# Patient Record
Sex: Male | Born: 1950 | Race: White | Hispanic: No | Marital: Single | Smoking: Current every day smoker
Health system: Southern US, Community
[De-identification: ages and names within clinical notes are randomized; demographics above are authoritative.]

## PROBLEM LIST (undated history)

## (undated) DIAGNOSIS — I82409 Acute embolism and thrombosis of unspecified deep veins of unspecified lower extremity: Secondary | ICD-10-CM

## (undated) DIAGNOSIS — I10 Essential (primary) hypertension: Secondary | ICD-10-CM

## (undated) DIAGNOSIS — E119 Type 2 diabetes mellitus without complications: Secondary | ICD-10-CM

## (undated) DIAGNOSIS — I4891 Unspecified atrial fibrillation: Secondary | ICD-10-CM

## (undated) HISTORY — PX: HERNIA REPAIR: SHX51

---

## 2018-12-09 ENCOUNTER — Emergency Department (HOSPITAL_COMMUNITY): Payer: Medicare Other

## 2018-12-09 ENCOUNTER — Other Ambulatory Visit: Payer: Self-pay

## 2018-12-09 ENCOUNTER — Inpatient Hospital Stay (HOSPITAL_COMMUNITY)
Admission: EM | Admit: 2018-12-09 | Discharge: 2018-12-16 | DRG: 563 | Disposition: A | Discharge status: Skilled Nursing Facility | Payer: Medicare Other | Attending: Internal Medicine | Admitting: Internal Medicine

## 2018-12-09 ENCOUNTER — Encounter (HOSPITAL_COMMUNITY): Payer: Self-pay | Admitting: Emergency Medicine

## 2018-12-09 DIAGNOSIS — Z86718 Personal history of other venous thrombosis and embolism: Secondary | ICD-10-CM

## 2018-12-09 DIAGNOSIS — Z7901 Long term (current) use of anticoagulants: Secondary | ICD-10-CM

## 2018-12-09 DIAGNOSIS — T81509A Unspecified complication of foreign body accidentally left in body following unspecified procedure, initial encounter: Secondary | ICD-10-CM

## 2018-12-09 DIAGNOSIS — I4891 Unspecified atrial fibrillation: Secondary | ICD-10-CM | POA: Diagnosis present

## 2018-12-09 DIAGNOSIS — W19XXXA Unspecified fall, initial encounter: Secondary | ICD-10-CM

## 2018-12-09 DIAGNOSIS — R55 Syncope and collapse: Secondary | ICD-10-CM | POA: Diagnosis not present

## 2018-12-09 DIAGNOSIS — Z79899 Other long term (current) drug therapy: Secondary | ICD-10-CM

## 2018-12-09 DIAGNOSIS — E1110 Type 2 diabetes mellitus with ketoacidosis without coma: Secondary | ICD-10-CM

## 2018-12-09 DIAGNOSIS — R7989 Other specified abnormal findings of blood chemistry: Secondary | ICD-10-CM | POA: Diagnosis present

## 2018-12-09 DIAGNOSIS — S92321A Displaced fracture of second metatarsal bone, right foot, initial encounter for closed fracture: Secondary | ICD-10-CM | POA: Diagnosis not present

## 2018-12-09 DIAGNOSIS — I10 Essential (primary) hypertension: Secondary | ICD-10-CM | POA: Diagnosis present

## 2018-12-09 DIAGNOSIS — Z7984 Long term (current) use of oral hypoglycemic drugs: Secondary | ICD-10-CM

## 2018-12-09 DIAGNOSIS — F17210 Nicotine dependence, cigarettes, uncomplicated: Secondary | ICD-10-CM | POA: Diagnosis present

## 2018-12-09 DIAGNOSIS — F419 Anxiety disorder, unspecified: Secondary | ICD-10-CM | POA: Diagnosis present

## 2018-12-09 DIAGNOSIS — F4310 Post-traumatic stress disorder, unspecified: Secondary | ICD-10-CM | POA: Diagnosis present

## 2018-12-09 DIAGNOSIS — E1165 Type 2 diabetes mellitus with hyperglycemia: Secondary | ICD-10-CM | POA: Diagnosis present

## 2018-12-09 DIAGNOSIS — R531 Weakness: Secondary | ICD-10-CM

## 2018-12-09 DIAGNOSIS — S92901A Unspecified fracture of right foot, initial encounter for closed fracture: Secondary | ICD-10-CM | POA: Diagnosis present

## 2018-12-09 DIAGNOSIS — Z20828 Contact with and (suspected) exposure to other viral communicable diseases: Secondary | ICD-10-CM | POA: Diagnosis present

## 2018-12-09 DIAGNOSIS — I700 Atherosclerosis of aorta: Secondary | ICD-10-CM | POA: Diagnosis present

## 2018-12-09 DIAGNOSIS — T17808A Unspecified foreign body in other parts of respiratory tract causing other injury, initial encounter: Secondary | ICD-10-CM | POA: Diagnosis present

## 2018-12-09 DIAGNOSIS — E119 Type 2 diabetes mellitus without complications: Secondary | ICD-10-CM

## 2018-12-09 DIAGNOSIS — Z79891 Long term (current) use of opiate analgesic: Secondary | ICD-10-CM

## 2018-12-09 DIAGNOSIS — I2510 Atherosclerotic heart disease of native coronary artery without angina pectoris: Secondary | ICD-10-CM | POA: Diagnosis present

## 2018-12-09 DIAGNOSIS — Z7982 Long term (current) use of aspirin: Secondary | ICD-10-CM

## 2018-12-09 DIAGNOSIS — I77810 Thoracic aortic ectasia: Secondary | ICD-10-CM | POA: Diagnosis present

## 2018-12-09 DIAGNOSIS — Z95828 Presence of other vascular implants and grafts: Secondary | ICD-10-CM

## 2018-12-09 DIAGNOSIS — E1142 Type 2 diabetes mellitus with diabetic polyneuropathy: Secondary | ICD-10-CM | POA: Diagnosis present

## 2018-12-09 DIAGNOSIS — W1830XA Fall on same level, unspecified, initial encounter: Secondary | ICD-10-CM | POA: Diagnosis present

## 2018-12-09 DIAGNOSIS — S99921A Unspecified injury of right foot, initial encounter: Secondary | ICD-10-CM | POA: Diagnosis present

## 2018-12-09 DIAGNOSIS — F329 Major depressive disorder, single episode, unspecified: Secondary | ICD-10-CM | POA: Diagnosis not present

## 2018-12-09 HISTORY — DX: Acute embolism and thrombosis of unspecified deep veins of unspecified lower extremity: I82.409

## 2018-12-09 HISTORY — DX: Type 2 diabetes mellitus without complications: E11.9

## 2018-12-09 HISTORY — DX: Essential (primary) hypertension: I10

## 2018-12-09 LAB — CBC WITH DIFFERENTIAL/PLATELET
Abs Immature Granulocytes: 0.04 10*3/uL (ref 0.00–0.07)
Basophils Absolute: 0 10*3/uL (ref 0.0–0.1)
Basophils Relative: 0 %
Eosinophils Absolute: 0.2 10*3/uL (ref 0.0–0.5)
Eosinophils Relative: 2 %
HCT: 40.1 % (ref 39.0–52.0)
Hemoglobin: 13.7 g/dL (ref 13.0–17.0)
Immature Granulocytes: 1 %
Lymphocytes Relative: 12 %
Lymphs Abs: 1 10*3/uL (ref 0.7–4.0)
MCH: 28.4 pg (ref 26.0–34.0)
MCHC: 34.2 g/dL (ref 30.0–36.0)
MCV: 83 fL (ref 80.0–100.0)
Monocytes Absolute: 0.8 10*3/uL (ref 0.1–1.0)
Monocytes Relative: 9 %
Neutro Abs: 6.4 10*3/uL (ref 1.7–7.7)
Neutrophils Relative %: 76 %
Platelets: 146 10*3/uL — ABNORMAL LOW (ref 150–400)
RBC: 4.83 MIL/uL (ref 4.22–5.81)
RDW: 12.7 % (ref 11.5–15.5)
WBC: 8.4 10*3/uL (ref 4.0–10.5)
nRBC: 0 % (ref 0.0–0.2)

## 2018-12-09 NOTE — ED Triage Notes (Addendum)
Patient arrived by EMS from home. Pt had a fall. Pt was found by [employer] pl[employer] out in the parking lot outside of his apartment.   Pt has abrasion to LFT elbow per EMS.   Alert and Oriented x 4 per EMS.   Pt is on blood thinners per EMS.   Pt denies hitting head and LOC per EMS.   Pt has chronic leg pain w/ numbness.   Pt also lost his home key per EMS. Patient's landlord stated they would pick patient up upon discharge per EMS.

## 2018-12-09 NOTE — ED Notes (Signed)
Pt ambulatory with unsteady gait using one assist to the restroom. On the return trip, he became increasingly unsteady on his feet and required two staff assist to return to his bed.

## 2018-12-09 NOTE — ED Provider Notes (Signed)
[facility] DEPT Provider Note   CSN: [id number] Arrival date & time: 12/09/18  1833     History   Chief Complaint Chief Complaint  Patient presents with  . Fall    HPI Randall Coleman is a 68 y.o. male.     Patient with past medical history notable for diabetes, hypertension, DVT, presents to the emergency department with a chief complaint of fall.  He states that he has had increasing weakness over the past couple of days, but significantly worsened today.  States that he felt a little dizzy and fell landing on his left side.  He denies striking his head.  He reports generalized weakness in bilateral upper and lower extremities.  Denies chest pain or shortness of breath.  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  He is anticoagulated.  Uncertain of compliance.  The history is provided by the patient. No language interpreter was used.    Past Medical History:  Diagnosis Date  . Diabetes mellitus without complication ([place])   . DVT (deep venous thrombosis) ([place])   . Hypertension     There are no active problems to display for this patient.   Past Surgical History:  Procedure Laterality Date  . HERNIA REPAIR    . HERNIA REPAIR          Home Medications    Prior to Admission medications   Not on File    Family History History reviewed. No pertinent family history.  Social History Social History   Tobacco Use  . Smoking status: Current Some Day Smoker  . Smokeless tobacco: Never Used  Substance Use Topics  . Alcohol use: Not Currently    Frequency: Never  . Drug use: Never     Allergies   Patient has no allergy information on record.   Review of Systems Review of Systems  All other systems reviewed and are negative.    Physical Exam Updated Vital Signs BP (!) 189/106   Pulse (!) 120   Temp 98.9 F (37.2 C) (Oral)   Resp 20   Ht 6\' 2"  (1.88 m)   Wt 108.9 kg   SpO2 93%   BMI 30.81 kg/m   Physical Exam Vitals signs and  nursing note reviewed.  Constitutional:      Appearance: He is well-developed.  HENT:     Head: Normocephalic and atraumatic.  Eyes:     Conjunctiva/sclera: Conjunctivae normal.  Neck:     Musculoskeletal: Neck supple.  Cardiovascular:     Rate and Rhythm: Regular rhythm. Tachycardia present.     Heart sounds: No murmur.     Comments: Tachycardic Pulmonary:     Effort: Pulmonary effort is normal. No respiratory distress.     Breath sounds: Normal breath sounds.  Abdominal:     Palpations: Abdomen is soft.     Tenderness: There is no abdominal tenderness.  Musculoskeletal: Normal range of motion.  Skin:    General: Skin is warm and dry.     Comments: Mild skin tear to left posterior elbow  Neurological:     Mental Status: He is alert and oriented to person, place, and time.     Comments: Speech is slurred, but patient states this is his normal speech CN III-XII intact Moves all extremities No strength deficits  Psychiatric:        Mood and Affect: Mood normal.        Behavior: Behavior normal.      ED Treatments / Results  Labs (all labs ordered are listed, but only abnormal results are displayed) Labs Reviewed  CBC WITH DIFFERENTIAL/PLATELET  COMPREHENSIVE METABOLIC PANEL  ETHANOL  RAPID URINE DRUG SCREEN, HOSP PERFORMED  URINALYSIS, ROUTINE W REFLEX MICROSCOPIC  D-DIMER, QUANTITATIVE (NOT AT [facility])  TROPONIN I (HIGH SENSITIVITY)    EKG None  Radiology No results found.  Procedures Procedures (including critical care time)  Medications Ordered in ED Medications - No data to display   Initial Impression / Assessment and Plan / ED Course  I have reviewed the triage vital signs and the nursing notes.  Pertinent labs & imaging results that were available during my care of the patient were reviewed by me and considered in my medical decision making (see chart for details).        Patient with history of DVTs.  Noted to be tachycardic.  Questionable  near syncope.  With hx of DVT with questionable compliance, will check d-dimer.  No apparent head injury, but with him being anticoagulated and falling, will check CT head and neck.  He does have some slurred speech on my exam, but he states that this is his normal speech.  No available prior records to review in epic.  Will check screening labs.  Laboratory work-up concerning for elevated troponin 65, also d-dimer is elevated at 3.43.  Will check CT PE.  CT head and cervical spine are negative for acute process.  CT PE study notable for metallic foreign body in the right pulmonary artery.  This could be from prior guidewire versus IVC filter fracture.  Radiology recommends plain films of the abdomen with subsequent CT imaging if IVC filter is present.  This will likely need to be retrieved.  Case discussed with Dr. Leonette Monarch, who recommends admission to the hospital.  Patient will likely need to be seen by interventional radiology to see about retrieval of the metallic foreign body in the pulmonary artery.  Appreciate Dr. Marlowe Sax for bringing patient into the hospital.  Final Clinical Impressions(s) / ED Diagnoses   Final diagnoses:  Generalized weakness    ED Discharge Orders    None       Montine Circle, PA-C 12/10/18 [id number]    Margette Fast, MD 12/10/18 1145

## 2018-12-10 ENCOUNTER — Other Ambulatory Visit: Payer: Self-pay

## 2018-12-10 ENCOUNTER — Encounter (HOSPITAL_COMMUNITY): Payer: Self-pay

## 2018-12-10 ENCOUNTER — Observation Stay (HOSPITAL_COMMUNITY): Payer: Medicare Other

## 2018-12-10 ENCOUNTER — Emergency Department (HOSPITAL_COMMUNITY): Payer: Medicare Other

## 2018-12-10 ENCOUNTER — Observation Stay (HOSPITAL_BASED_OUTPATIENT_CLINIC_OR_DEPARTMENT_OTHER): Payer: Medicare Other

## 2018-12-10 DIAGNOSIS — I10 Essential (primary) hypertension: Secondary | ICD-10-CM | POA: Diagnosis present

## 2018-12-10 DIAGNOSIS — T17808A Unspecified foreign body in other parts of respiratory tract causing other injury, initial encounter: Secondary | ICD-10-CM | POA: Diagnosis present

## 2018-12-10 DIAGNOSIS — R55 Syncope and collapse: Secondary | ICD-10-CM | POA: Diagnosis present

## 2018-12-10 DIAGNOSIS — Z86718 Personal history of other venous thrombosis and embolism: Secondary | ICD-10-CM

## 2018-12-10 DIAGNOSIS — I4891 Unspecified atrial fibrillation: Secondary | ICD-10-CM | POA: Diagnosis present

## 2018-12-10 DIAGNOSIS — E119 Type 2 diabetes mellitus without complications: Secondary | ICD-10-CM

## 2018-12-10 DIAGNOSIS — S99921A Unspecified injury of right foot, initial encounter: Secondary | ICD-10-CM | POA: Diagnosis present

## 2018-12-10 DIAGNOSIS — E1110 Type 2 diabetes mellitus with ketoacidosis without coma: Secondary | ICD-10-CM

## 2018-12-10 LAB — URINALYSIS, ROUTINE W REFLEX MICROSCOPIC
Bilirubin Urine: NEGATIVE
Glucose, UA: >=500 mg/dL — AB
Hgb urine dipstick: NEGATIVE
Ketones, ur: 5 mg/dL — AB
Leukocytes,Ua: NEGATIVE
Nitrite: NEGATIVE
Protein, ur: NEGATIVE mg/dL
Specific Gravity, Urine: 1.012 (ref 1.005–1.030)
pH: 6 (ref 5.0–8.0)

## 2018-12-10 LAB — RAPID URINE DRUG SCREEN, HOSP PERFORMED
Amphetamines: NOT DETECTED
Barbiturates: NOT DETECTED
Benzodiazepines: NOT DETECTED
Cocaine: NOT DETECTED
Opiates: NOT DETECTED
Tetrahydrocannabinol: NOT DETECTED

## 2018-12-10 LAB — COMPREHENSIVE METABOLIC PANEL
ALT: 28 U/L (ref 0–44)
AST: 28 U/L (ref 15–41)
Albumin: 4.2 g/dL (ref 3.5–5.0)
Alkaline Phosphatase: 41 U/L (ref 38–126)
Anion gap: 11 (ref 5–15)
BUN: 14 mg/dL (ref 8–23)
CO2: 23 mmol/L (ref 22–32)
Calcium: 8.9 mg/dL (ref 8.9–10.3)
Chloride: 101 mmol/L (ref 98–111)
Creatinine, Ser: 0.89 mg/dL (ref 0.61–1.24)
GFR calc Af Amer: >60 mL/min (ref >60)
GFR calc non Af Amer: >60 mL/min (ref >60)
Glucose, Bld: 213 mg/dL — ABNORMAL HIGH (ref 70–99)
Potassium: 4 mmol/L (ref 3.5–5.1)
Sodium: 135 mmol/L (ref 135–145)
Total Bilirubin: 0.8 mg/dL (ref 0.3–1.2)
Total Protein: 6.4 g/dL — ABNORMAL LOW (ref 6.5–8.1)

## 2018-12-10 LAB — ETHANOL: Alcohol, Ethyl (B): <10 mg/dL (ref <10)

## 2018-12-10 LAB — GLUCOSE, CAPILLARY
Glucose-Capillary: 247 mg/dL — ABNORMAL HIGH (ref 70–99)
Glucose-Capillary: 226 mg/dL — ABNORMAL HIGH (ref 70–99)
Glucose-Capillary: 213 mg/dL — ABNORMAL HIGH (ref 70–99)

## 2018-12-10 LAB — PROTIME-INR
INR: 1.1 (ref 0.8–1.2)
Prothrombin Time: 14.2 seconds (ref 11.4–15.2)

## 2018-12-10 LAB — SARS CORONAVIRUS 2 (TAT 6-24 HRS): SARS Coronavirus 2: NEGATIVE

## 2018-12-10 LAB — HEMOGLOBIN A1C
Hgb A1c MFr Bld: 7.3 % — ABNORMAL HIGH (ref 4.8–5.6)
Mean Plasma Glucose: 162.81 mg/dL

## 2018-12-10 LAB — TSH: TSH: 1.061 u[IU]/mL (ref 0.350–4.500)

## 2018-12-10 LAB — TROPONIN I (HIGH SENSITIVITY)
Troponin I (High Sensitivity): 65 ng/L — ABNORMAL HIGH (ref <18)
Troponin I (High Sensitivity): 69 ng/L — ABNORMAL HIGH (ref <18)

## 2018-12-10 LAB — D-DIMER, QUANTITATIVE: D-Dimer, Quant: 3.43 ug/mL-FEU — ABNORMAL HIGH (ref 0.00–0.50)

## 2018-12-10 LAB — ECHOCARDIOGRAM COMPLETE
Height: 74 in
Weight: 3840 oz

## 2018-12-10 LAB — HIV ANTIBODY (ROUTINE TESTING W REFLEX): HIV Screen 4th Generation wRfx: NONREACTIVE

## 2018-12-10 MED ORDER — PROPRANOLOL HCL 10 MG PO TABS
10.0000 mg | ORAL_TABLET | Freq: Every day | ORAL | Status: DC
  Administered 2018-12-10 – 2018-12-16 (×7): 10 mg via ORAL
  Filled 2018-12-10 (×7): qty 1

## 2018-12-10 MED ORDER — LISINOPRIL 5 MG PO TABS
5.0000 mg | ORAL_TABLET | Freq: Every day | ORAL | Status: DC
  Administered 2018-12-10 – 2018-12-16 (×7): 5 mg via ORAL
  Filled 2018-12-10 (×7): qty 1

## 2018-12-10 MED ORDER — IOHEXOL 350 MG/ML SOLN
100.0000 mL | Freq: Once | INTRAVENOUS | Status: AC | PRN
  Administered 2018-12-10: 100 mL via INTRAVENOUS

## 2018-12-10 MED ORDER — SODIUM CHLORIDE 0.9% FLUSH
3.0000 mL | Freq: Two times a day (BID) | INTRAVENOUS | Status: DC
  Administered 2018-12-10 – 2018-12-16 (×10): 3 mL via INTRAVENOUS

## 2018-12-10 MED ORDER — APIXABAN 5 MG PO TABS: 5.0000 mg | ORAL_TABLET | Freq: Two times a day (BID) | ORAL | Status: DC

## 2018-12-10 MED ORDER — ARIPIPRAZOLE 10 MG PO TABS
20.0000 mg | ORAL_TABLET | Freq: Every day | ORAL | Status: DC
  Administered 2018-12-10 – 2018-12-16 (×7): 20 mg via ORAL
  Filled 2018-12-10 (×7): qty 2

## 2018-12-10 MED ORDER — BUSPIRONE HCL 5 MG PO TABS
15.0000 mg | ORAL_TABLET | Freq: Three times a day (TID) | ORAL | Status: DC
  Administered 2018-12-10 – 2018-12-16 (×19): 15 mg via ORAL
  Filled 2018-12-10 (×18): qty 3

## 2018-12-10 MED ORDER — ADULT MULTIVITAMIN W/MINERALS CH
1.0000 | ORAL_TABLET | Freq: Every day | ORAL | Status: DC
  Administered 2018-12-10 – 2018-12-16 (×7): 1 via ORAL
  Filled 2018-12-10 (×7): qty 1

## 2018-12-10 MED ORDER — INSULIN ASPART 100 UNIT/ML ~~LOC~~ SOLN
0.0000 [IU] | Freq: Every day | SUBCUTANEOUS | Status: DC
  Administered 2018-12-10 – 2018-12-11 (×2): 2 [IU] via SUBCUTANEOUS
  Administered 2018-12-13: 4 [IU] via SUBCUTANEOUS
  Administered 2018-12-14 – 2018-12-15 (×2): 2 [IU] via SUBCUTANEOUS

## 2018-12-10 MED ORDER — NICOTINE 21 MG/24HR TD PT24
21.0000 mg | MEDICATED_PATCH | Freq: Every day | TRANSDERMAL | Status: DC
  Administered 2018-12-10 – 2018-12-16 (×7): 21 mg via TRANSDERMAL
  Filled 2018-12-10 (×7): qty 1

## 2018-12-10 MED ORDER — BENZTROPINE MESYLATE 0.5 MG PO TABS
1.0000 mg | ORAL_TABLET | Freq: Every day | ORAL | Status: DC
  Administered 2018-12-10 – 2018-12-16 (×7): 1 mg via ORAL
  Filled 2018-12-10 (×7): qty 2

## 2018-12-10 MED ORDER — APIXABAN 5 MG PO TABS
5.0000 mg | ORAL_TABLET | Freq: Two times a day (BID) | ORAL | Status: DC
  Administered 2018-12-10: 5 mg via ORAL
  Filled 2018-12-10: qty 1

## 2018-12-10 MED ORDER — GABAPENTIN 300 MG PO CAPS
300.0000 mg | ORAL_CAPSULE | Freq: Three times a day (TID) | ORAL | Status: DC
  Administered 2018-12-10 – 2018-12-16 (×19): 300 mg via ORAL
  Filled 2018-12-10 (×19): qty 1

## 2018-12-10 MED ORDER — BUPROPION HCL ER (XL) 150 MG PO TB24
150.0000 mg | ORAL_TABLET | Freq: Every day | ORAL | Status: DC
  Administered 2018-12-10 – 2018-12-16 (×7): 150 mg via ORAL
  Filled 2018-12-10 (×7): qty 1

## 2018-12-10 MED ORDER — LACTATED RINGERS IV SOLN
INTRAVENOUS | Status: DC
  Administered 2018-12-10 – 2018-12-11 (×2): via INTRAVENOUS

## 2018-12-10 MED ORDER — SODIUM CHLORIDE (PF) 0.9 % IJ SOLN
INTRAMUSCULAR | Status: AC
  Filled 2018-12-10: qty 50

## 2018-12-10 MED ORDER — ASPIRIN EC 81 MG PO TBEC
81.0000 mg | DELAYED_RELEASE_TABLET | Freq: Every day | ORAL | Status: DC
  Administered 2018-12-10 – 2018-12-16 (×7): 81 mg via ORAL
  Filled 2018-12-10 (×7): qty 1

## 2018-12-10 MED ORDER — ACETAMINOPHEN 650 MG RE SUPP
650.0000 mg | Freq: Four times a day (QID) | RECTAL | Status: DC | PRN
  Filled 2018-12-10: qty 1

## 2018-12-10 MED ORDER — ONDANSETRON HCL 4 MG/2ML IJ SOLN: 4.0000 mg | Freq: Four times a day (QID) | INTRAMUSCULAR | Status: DC | PRN

## 2018-12-10 MED ORDER — PANTOPRAZOLE SODIUM 40 MG PO TBEC
40.0000 mg | DELAYED_RELEASE_TABLET | Freq: Every day | ORAL | Status: DC
  Administered 2018-12-10 – 2018-12-16 (×7): 40 mg via ORAL
  Filled 2018-12-10 (×7): qty 1

## 2018-12-10 MED ORDER — ONDANSETRON HCL 4 MG PO TABS: 4.0000 mg | ORAL_TABLET | Freq: Four times a day (QID) | ORAL | Status: DC | PRN

## 2018-12-10 MED ORDER — TRAZODONE HCL 100 MG PO TABS
200.0000 mg | ORAL_TABLET | Freq: Every day | ORAL | Status: DC
  Administered 2018-12-10 – 2018-12-15 (×6): 200 mg via ORAL
  Filled 2018-12-10 (×6): qty 2

## 2018-12-10 MED ORDER — INSULIN ASPART 100 UNIT/ML ~~LOC~~ SOLN
0.0000 [IU] | Freq: Three times a day (TID) | SUBCUTANEOUS | Status: DC
  Administered 2018-12-10 (×2): 5 [IU] via SUBCUTANEOUS
  Administered 2018-12-11: 8 [IU] via SUBCUTANEOUS
  Administered 2018-12-11: 5 [IU] via SUBCUTANEOUS
  Administered 2018-12-11 – 2018-12-12 (×2): 3 [IU] via SUBCUTANEOUS
  Administered 2018-12-12: 8 [IU] via SUBCUTANEOUS
  Administered 2018-12-12 – 2018-12-13 (×2): 5 [IU] via SUBCUTANEOUS
  Administered 2018-12-13: 2 [IU] via SUBCUTANEOUS
  Administered 2018-12-13: 5 [IU] via SUBCUTANEOUS
  Administered 2018-12-14: 8 [IU] via SUBCUTANEOUS
  Administered 2018-12-14: 5 [IU] via SUBCUTANEOUS
  Administered 2018-12-14: 8 [IU] via SUBCUTANEOUS
  Administered 2018-12-15: 12:00:00 5 [IU] via SUBCUTANEOUS
  Administered 2018-12-15: 17:00:00 15 [IU] via SUBCUTANEOUS
  Administered 2018-12-15: 08:00:00 5 [IU] via SUBCUTANEOUS
  Administered 2018-12-16: 15 [IU] via SUBCUTANEOUS
  Administered 2018-12-16: 8 [IU] via SUBCUTANEOUS

## 2018-12-10 MED ORDER — ACETAMINOPHEN 325 MG PO TABS
650.0000 mg | ORAL_TABLET | Freq: Four times a day (QID) | ORAL | Status: DC | PRN
  Administered 2018-12-10 – 2018-12-14 (×3): 650 mg via ORAL
  Filled 2018-12-10 (×3): qty 2

## 2018-12-10 NOTE — H&P (Addendum)
History and Physical    Randall Coleman [id number] DOB: 09/08/1950 DOA: 12/09/2018  [facility]  [facility] - [facility]opedics; other specialists through the [place] Patient coming from:  Home - lives alone in an apartment; NOK: Ernestine Conrad, [phone]  Chief Complaint: Syncope  HPI: Randall Coleman is a 67 y.o. male with medical history significant of HTN; DM; and DVT presenting with syncope.  He was working on a car and moving it by hand.  He turned over and when he did the car "squeezed" him along his right side.  He denies LOC.  Other friends were also helping, and they were in the parking lot.  His friends decided to call 911.  His foot is the only thing that hurts now.  He feels his heart beating fast at times but denies h/o afib.  He was on Coumadin and is now on Eliquis.  Denies h/o blood clot.  I spoke with his friend.  She is the Secondary school teacher at his apartment complex.  He went to [place] and drive to get groceries.  His car wouldn't start so he started walking back to his apartment.  He fell twice, possibly because his legs gave out.  Someone saw him fall and took him home.  She was gone much of the day and when she came back his car was back.  She saw him lying on the concrete next to the car and he was disoriented - she isn't sure if he fell.  She tried to talk to him and he was talking crazy.  His head was bleeding a bit and his elbows looked bruised.  He wasn't able to get up and some folks tried to get him up and he was still weak and disoriented and so they called 911.  He takes a lot of medicines, for his legs and for depression.  She doesn't know what else he takes.  He wears compression socks.    ED Course:  Carryover, per Dr. Marlowe Sax:  History per ED PA Lorre Munroe. Patient is a poor historian and not much history could be obtained from him. He was found down outside his apartment yesterday evening. Awake and alert now. Head CT negative. Troponin 65 >69. EKG  negative. D-dimer elevated and CT angiogram was ordered. CT angiogram negative for PE but showing metallic foreign body in the right pulmonary artery. Radiologist feels this could be from an IVC filter versus guidewire. He will need IR consultation in a.m. for removal of this foreign body.  Review of Systems: As per HPI; otherwise review of systems reviewed and negative.   Ambulatory Status:  Ambulates without assistance  Past Medical History:  Diagnosis Date   Diabetes mellitus without complication ([place])    DVT (deep venous thrombosis) (HCC)    Hypertension     Past Surgical History:  Procedure Laterality Date   HERNIA REPAIR     HERNIA REPAIR      Social History   Socioeconomic History   Marital status: Single    Spouse name: Not on file   Number of children: Not on file   Years of education: Not on file   Highest education level: Not on file  Occupational History   Occupation: retired  Scientist, product/process development strain: Not on file   Food insecurity    Worry: Not on file    Inability: Not on file   Transportation needs    Medical: Not on file    Non-medical: Not  on file  Tobacco Use   Smoking status: Current Every Day Smoker    Packs/day: 1.00    Years: 54.00    Pack years: 54.00   Smokeless tobacco: Never Used  Substance and Sexual Activity   Alcohol use: Not Currently    Frequency: Never    Comment: h/o heavy use, quit about 10 years ago   Drug use: Never    Comment: used when he was younger   Sexual activity: Not Currently  Lifestyle   Physical activity    Days per week: Not on file    Minutes per session: Not on file   Stress: Not on file  Relationships   Social connections    Talks on phone: Not on file    Gets together: Not on file    Attends religious service: Not on file    Active member of club or organization: Not on file    Attends meetings of clubs or organizations: Not on file    Relationship status: Not on  file   Intimate partner violence    Fear of current or ex partner: Not on file    Emotionally abused: Not on file    Physically abused: Not on file    Forced sexual activity: Not on file  Other Topics Concern   Not on file  Social History Narrative   Not on file    No Known Allergies  History reviewed. No pertinent family history.  Prior to Admission medications   Not on File    Physical Exam: Vitals:   12/10/18 0615 12/10/18 0700 12/10/18 0730 12/10/18 0821  BP:  (!) 152/79 (!) 167/96 (!) 173/81  Pulse: 88 86 91 95  Resp: 17 (!) 23 14 18   Temp:    99 F (37.2 C)  TempSrc:    Oral  SpO2: 96% 93% 96% 97%  Weight:      Height:          General:  Appears calm and comfortable and is NAD  Eyes:  PERRL, EOMI, normal lids, iris  ENT:  grossly normal hearing, lips & tongue, mmm; edentulous  Neck:  no LAD, masses or thyromegaly  Cardiovascular:  Irregularly irregular, rate controlled, no m/r/g.   Respiratory:   CTA bilaterally with no wheezes/rales/rhonchi.  Normal respiratory effort.  Abdomen:  soft, NT, ND, NABS  Back:   normal alignment, no CVAT  Skin:  R elbow ecchymosis, R foot with diffuse ecchymoses but particularly around great toe and base of 1st 3 toes        Musculoskeletal:  grossly normal tone BUE/BLE, good ROM, no obvious bony abnormality  Lower extremity:  R foot edema with ecchymosis concerning for fractures; otherwise B LE without edema  Psychiatric:  grossly normal mood and affect, speech fluent and appropriate, AOx3, poor historian  Neurologic:  CN 2-12 grossly intact, moves all extremities in coordinated fashion, sensation intact    Radiological Exams on Admission: Ct Abdomen Wo Contrast  Result Date: 12/10/2018 CLINICAL DATA:  Linear wire like metallic foreign body in the LEFT main pulmonary artery. EXAM: CT ABDOMEN WITHOUT CONTRAST TECHNIQUE: Multidetector CT imaging of the abdomen was performed following the standard protocol  without IV contrast. COMPARISON:  CT chest same day FINDINGS: Lower chest:  Lung bases are clear. Hepatobiliary: No focal hepatic lesion.  Gallbladder normal Pancreas: Normal pancreatic parenchymal intensity. No ductal dilatation or inflammation. Spleen: Normal spleen. Adrenals/urinary tract: Adrenal glands and kidneys are normal. Stomach/Bowel: Stomach and limited of the  small bowel is unremarkable Vascular/Lymphatic: Interval calcification of abdominal aorta. Infrarenal IVC filter in appropriate position. There is no evidence fracture of the filter. The struts appear intact. Musculoskeletal: No aggressive osseous lesion IMPRESSION: Infrarenal IVC filter in appropriate position. No hardware fracture evident. Electronically Signed   By: Suzy Bouchard M.D.   On: 12/10/2018 06:05   Dg Chest 2 View  Result Date: 12/09/2018 CLINICAL DATA:  Fall EXAM: CHEST - 2 VIEW COMPARISON:  None. FINDINGS: Basilar atelectatic changes. Question small granuloma versus vessel en face the right cardiophrenic sulcus. No consolidation, features of edema, pneumothorax, or effusion. Pulmonary vascularity is normally distributed. The cardiomediastinal contours are unremarkable. No acute osseous or soft tissue abnormality. Degenerative changes are present in the imaged spine and shoulders. 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 IMPRESSION: 1. No acute cardiopulmonary disease or traumatic finding in the chest. 2. Question small granuloma versus vessel en face in the right lung base. Electronically Signed   By: Lovena Le M.D.   On: 12/09/2018 22:34   Ct Head Wo Contrast  Result Date: 12/09/2018 CLINICAL DATA:  Found down outside of apartment EXAM: CT HEAD WITHOUT CONTRAST CT CERVICAL SPINE WITHOUT CONTRAST TECHNIQUE: Multidetector CT imaging of the head and cervical spine was performed following the standard protocol without intravenous contrast.  Multiplanar CT image reconstructions of the cervical spine were also generated. COMPARISON:  None. FINDINGS: CT HEAD FINDINGS Brain: No evidence of acute infarction, hemorrhage, hydrocephalus, extra-axial collection or mass lesion/mass effect. Symmetric prominence of the ventricles, cisterns and sulci compatible with parenchymal volume loss. Patchy areas of white matter hypoattenuation are most compatible with chronic microvascular angiopathy. Vascular: Atherosclerotic calcification of the carotid siphons and intradural vertebral arteries. No hyperdense vessel. Skull: Mild right supraorbital soft tissue swelling. No calvarial fracture or suspicious osseous lesion. Sinuses/Orbits: Leftward nasal septal deviation. Paranasal sinuses and mastoid air cells are predominantly clear. Pneumatization of the petrous apices. Small amount of debris in the right external auditory canal. Included orbital structures are unremarkable. Other: Edentulous. CT CERVICAL SPINE FINDINGS Alignment: Cervical stabilization collar in place. There is slight exaggeration of normal cervical lordosis. No traumatic listhesis. Craniocervical and atlantoaxial articulations are normally aligned. No abnormal facet widening. Skull base and vertebrae: No acute fracture. No primary bone lesion or focal pathologic process. From Soft tissues and spinal canal: No pre or paravertebral fluid or swelling. No visible canal hematoma. Disc levels: Multilevel intervertebral disc height loss with spondylitic endplate changes and facet hypertrophic changes. Disc osteophyte complexes at C3-4, C4-5 and C6-7 result in at most mild canal stenosis. Uncinate spurring and facet hypertrophy results in mild-to-moderate multilevel foraminal narrowing throughout the cervical spine. Upper chest: No acute abnormality in the upper chest or imaged lung apices. Other: Included portions of the thyroid are unremarkable. There is calcification in the carotid bifurcations. IMPRESSION:  1. No acute intracranial abnormality. 2. Mild right supraorbital soft tissue swelling. 3. No acute cervical spine fracture or traumatic listhesis. 4. Multilevel cervical spondylitic changes, as above. Electronically Signed   By: Lovena Le M.D.   On: 12/09/2018 22:49   Ct Angio Chest Pe W And/or Wo Contrast  Result Date: 12/10/2018 CLINICAL DATA:  Fall, found outside apartment. History of blood clots. Positive D-dimer. EXAM: CT ANGIOGRAPHY CHEST WITH CONTRAST TECHNIQUE: Multidetector CT imaging of the chest was performed using the standard protocol during bolus administration of intravenous contrast. Multiplanar CT image reconstructions and MIPs were obtained to evaluate the vascular anatomy. CONTRAST:  196mL  OMNIPAQUE IOHEXOL 350 MG/ML SOLN COMPARISON:  Same day chest radiograph FINDINGS: Cardiovascular: Satisfactory opacification of the pulmonary arteries to the segmental level. No central, lobar or segmental filling defects are identified. There is a linear, metallic density along the posterior aspect of left main pulmonary artery approximately 3.6 cm in length. Atherosclerotic plaque within the normal caliber aorta. No periaortic stranding. Normal heart size. No pericardial effusion. Insert coronary Mediastinum/Nodes: No enlarged mediastinal or axillary lymph nodes. Thyroid gland, trachea, and esophagus demonstrate no significant findings. Few calcified right hilar nodes. Lungs/Pleura: Dependent atelectasis seen posteriorly. No consolidation, features of edema, pneumothorax, or effusion. Calcified granuloma seen in the right lung base corresponding to the radiographic abnormality. No suspicious pulmonary nodules or masses. Upper Abdomen: No acute abnormalities present in the visualized portions of the upper abdomen. Musculoskeletal: Tiny hypoattenuating 1 cm structure associated with the epidermal air along the right chest wall likely reflecting an epidermal inclusion cyst. No acute osseous abnormality or  suspicious osseous lesion. Multilevel degenerative changes are present in the imaged portions of the spine. 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 Remote posttraumatic deformity of the right anterior fifth rib. Review of the MIP images confirms the above findings. IMPRESSION: 1. No evidence of pulmonary embolism. No acute intrathoracic process. 2. Linear, metallic density along the posterior aspect of the left main pulmonary artery approximately 3.6 cm in length, favored to represent a metallic foreign body. Question the presence of an inferior vena cava filter on lateral chest radiograph, could reflect caval filter strut or fragmented guidewire. Could consider abdominal radiograph to confirm the presence of a caval filter and if present CT of the abdomen and pelvis would need to be obtained to assess the structural integrity as this will likely require retrieval. 3. Evidence of remote granulomatous disease in the chest. 4. Aortic Atherosclerosis (ICD10-I70.0). 5. Coronary atherosclerosis. These results were called by telephone at the time of interpretation on 12/10/2018 at 3:26 am to provider Montine Circle , who verbally acknowledged these results. Electronically Signed   By: Lovena Le M.D.   On: 12/10/2018 03:26   Ct Cervical Spine Wo Contrast  Result Date: 12/09/2018 CLINICAL DATA:  Found down outside of apartment EXAM: CT HEAD WITHOUT CONTRAST CT CERVICAL SPINE WITHOUT CONTRAST TECHNIQUE: Multidetector CT imaging of the head and cervical spine was performed following the standard protocol without intravenous contrast. Multiplanar CT image reconstructions of the cervical spine were also generated. COMPARISON:  None. FINDINGS: CT HEAD FINDINGS Brain: No evidence of acute infarction, hemorrhage, hydrocephalus, extra-axial collection or mass lesion/mass effect. Symmetric prominence of the ventricles, cisterns and sulci compatible  with parenchymal volume loss. Patchy areas of white matter hypoattenuation are most compatible with chronic microvascular angiopathy. Vascular: Atherosclerotic calcification of the carotid siphons and intradural vertebral arteries. No hyperdense vessel. Skull: Mild right supraorbital soft tissue swelling. No calvarial fracture or suspicious osseous lesion. Sinuses/Orbits: Leftward nasal septal deviation. Paranasal sinuses and mastoid air cells are predominantly clear. Pneumatization of the petrous apices. Small amount of debris in the right external auditory canal. Included orbital structures are unremarkable. Other: Edentulous. CT CERVICAL SPINE FINDINGS Alignment: Cervical stabilization collar in place. There is slight exaggeration of normal cervical lordosis. No traumatic listhesis. Craniocervical and atlantoaxial articulations are normally aligned. No abnormal facet widening. Skull base and vertebrae: No acute fracture. No primary bone lesion or focal pathologic process. From Soft tissues and spinal canal: No pre or paravertebral fluid or swelling. No visible canal hematoma. Disc levels: Multilevel  intervertebral disc height loss with spondylitic endplate changes and facet hypertrophic changes. Disc osteophyte complexes at C3-4, C4-5 and C6-7 result in at most mild canal stenosis. Uncinate spurring and facet hypertrophy results in mild-to-moderate multilevel foraminal narrowing throughout the cervical spine. Upper chest: No acute abnormality in the upper chest or imaged lung apices. Other: Included portions of the thyroid are unremarkable. There is calcification in the carotid bifurcations. IMPRESSION: 1. No acute intracranial abnormality. 2. Mild right supraorbital soft tissue swelling. 3. No acute cervical spine fracture or traumatic listhesis. 4. Multilevel cervical spondylitic changes, as above. Electronically Signed   By: Lovena Le M.D.   On: 12/09/2018 22:49   Dg Abd 2 Views  Result Date:  12/10/2018 CLINICAL DATA:  IVC filter location. EXAM: ABDOMEN - 2 VIEW COMPARISON:  None. FINDINGS: There is an IVC filter at the level of the renal vessels. There is excreted contrast within the renal pelvises and right ureter. Bowel gas pattern is normal. IMPRESSION: IVC filter at the level of the renal vessels. Electronically Signed   By: Ulyses Jarred M.D.   On: 12/10/2018 04:11    EKG: Independently reviewed.  NSR with PSVT vs. afib with rate 88;  no evidence of acute ischemia   Labs on Admission: I have personally reviewed the available labs and imaging studies at the time of the admission.  Pertinent labs:   Glucose 213 HS troponin 65, 69 Unremarkable CBC D-dimer 3.43 INR 1.1 ETOH <10 UA: >500 glucose, 5 ketones, rare bacteria UDS negative COVID pending   Assessment/Plan Principal Problem:   Syncope and collapse Active Problems:   Foreign body in lung   Foot injury, right, initial encounter   Essential hypertension   History of DVT of lower extremity   Type 2 diabetes mellitus without complication (HCC)   Unspecified atrial fibrillation (HCC)   Syncope -Patient is a poor historian and the history is unclear about whether there was actually a syncopal event -He reports getting "squeezed" by his car and was found on the ground with apparent confusion -He appears to be in afib and it is not clear if he has a h/o this -For now will observe on telemetry -Repeat EKG -Check echo -Orthostatic vital signs in AM -Elevated troponin but stable, likely demand ischemia rather than ACS -Neuro checks  -PT/OT eval and treat  Foreign body in pulmonary artery -Patient had screening CXR after fall yesterday and it showed ?granuloma vs. Vessel en face in the RLL -CTA was ordered which showed a linear metallic density in the L main pulmonary artery, 3.6 cm, concerning for metallic foreign body - possible IVC strut or fragmented guidewire and so CT abdomen/pelvis recommended -Abd xray  performed and showed IVC present at the level of the renal vessels, which was confirmed by abdominal/pelvic CT, without hardware fracture evident -With this information, IR consult placed for foreign body retrieval -I discussed the patient with Dr. Vernard Gambles -NPO after MN in anticipation of procedure  Foot injury -Uncertain exact mechanism of injury, but likely injured during fall/syncopal event -Fot xray ordered and shows a mildly displaced fracture at the base of the second metatarsal with mild displacement of the metatarsal, raising concern for a Lisfranc injury -I have spoken by Dr. Griffin Basil, who will plan to discuss further with foot/ankle specialist regarding further treatment -The foot is edematous enough that current surgery may not be reasonable and so he is planning to request ortho tech assistance for now  Concern for afib -EKG yesterday appeared to  be sinus arrhythmia but repeat EKG overnight was less clearly sinus -This AM on auscultation, he appears to be fairly clearly in afib -Will repeat EKG -Echo ordered -He is rate controlled with Propranolol already -He is already on Eliquis and this has been continued  HTN -Continue Lisinopril and Propranolol  DM -Will check A1c -hold Glucophage and Glucotrol -Cover with moderate-scale SSI  H/o DVT -Friend reports that he wears compression stockings -Continue Eliquis  Chronic mental illness -Uncertain baseline condition -Home medications continued including Abilify, Cogentin, Wellbutrin, Buspar, Trazodone    Note: This patient has been tested and is pending for the novel coronavirus COVID-19.   DVT prophylaxis:  Eliquis Code Status:  Full - confirmed with patient Family Communication: None present; I spoke with his friend by telephone  Disposition Plan:  Home once clinically improved Consults called: IR; orthopedics  Admission status: It is my clinical opinion that referral for OBSERVATION is reasonable and necessary in  this patient based on the above information provided. The aforementioned taken together are felt to place the patient at high risk for further clinical deterioration. However it is anticipated that the patient may be medically stable for discharge from the hospital within 24 to 48 hours.    Karmen Bongo MD [facility]   How to contact the [facility] Attending or Consulting provider [place] or covering provider during after hours [place], for this patient?  1. Check the care team in [facility] and look for a) attending/consulting [facility] provider listed and b) the [facility] team listed 2. Log into [email or web address] and use [facility] universal password to access. If you do not have the password, please contact the hospital operator. 3. Locate the [facility] provider you are looking for under [facility] and page to a number that you can be directly reached. 4. If you still have difficulty reaching the provider, please page the [facility] (Director on Call) for the Hospitalists listed on amion for assistance.   12/10/2018, 9:59 AM

## 2018-12-10 NOTE — Progress Notes (Signed)
  Echocardiogram 2D Echocardiogram has been performed.  Randall Coleman 12/10/2018, 1:22 PM

## 2018-12-10 NOTE — Consult Note (Signed)
ORTHOPAEDIC CONSULTATION  REQUESTING PHYSICIAN: Karmen Bongo, MD  Chief Complaint: Fall, right foot pain  HPI: Trung Wenzl is a 68 y.o. male with medical history significant of HTN, DM, and DVT. He presented to the Emergency Department following a fall/syncopal episode. Patient was admitted to the hospital for further evaluation.  Orthopedics was consulted for the patient's foot injury. Exact mechanism of injury is uncertain, but likely during his fall/syncopal episode.   Xrays show a mildly displaced fracture at the base of the second metatarsal with mild displacement of the metatarsal, raising concern for a Lisfranc injury.   Patient's right foot was placed in a splint. He is resting comfortable in his hospital bed watching football. He reports minimal pain regarding his right foot. Pain worsens when he tries to move his toes. He denies any other injury, numbness or tingling, or fever and chills.   Past Medical History:  Diagnosis Date   Diabetes mellitus without complication (HCC)    DVT (deep venous thrombosis) (HCC)    Hypertension    Past Surgical History:  Procedure Laterality Date   HERNIA REPAIR     HERNIA REPAIR     Social History   Socioeconomic History   Marital status: Single    Spouse name: Not on file   Number of children: Not on file   Years of education: Not on file   Highest education level: Not on file  Occupational History   Occupation: retired  Scientist, product/process development strain: Not on file   Food insecurity    Worry: Not on file    Inability: Not on Lexicographer needs    Medical: Not on file    Non-medical: Not on file  Tobacco Use   Smoking status: Current Every Day Smoker    Packs/day: 1.00    Years: 54.00    Pack years: 54.00   Smokeless tobacco: Never Used  Substance and Sexual Activity   Alcohol use: Not Currently    Frequency: Never    Comment: h/o heavy use, quit about 10 years ago   Drug  use: Never    Comment: used when he was younger   Sexual activity: Not Currently  Lifestyle   Physical activity    Days per week: Not on file    Minutes per session: Not on file   Stress: Not on file  Relationships   Social connections    Talks on phone: Not on file    Gets together: Not on file    Attends religious service: Not on file    Active member of club or organization: Not on file    Attends meetings of clubs or organizations: Not on file    Relationship status: Not on file  Other Topics Concern   Not on file  Social History Narrative   Not on file   History reviewed. No pertinent family history. No Known Allergies Prior to Admission medications   Medication Sig Start Date End Date Taking? Authorizing Provider  apixaban (ELIQUIS) 5 MG TABS tablet Take 5 mg by mouth 2 (two) times daily.   Yes [provider]  ARIPiprazole (ABILIFY) 20 MG tablet Take 20 mg by mouth daily.   Yes [provider]  aspirin EC 81 MG tablet Take 81 mg by mouth daily.   Yes [provider]  benztropine (COGENTIN) 1 MG tablet Take 1 mg by mouth daily.   Yes [provider]  buPROPion (WELLBUTRIN XL)  150 MG 24 hr tablet Take 150 mg by mouth daily.   Yes [provider]  busPIRone (BUSPAR) 15 MG tablet Take 15 mg by mouth 3 (three) times daily.   Yes [provider]  gabapentin (NEURONTIN) 300 MG capsule Take 300 mg by mouth 3 (three) times daily. 03/15/17  Yes [provider]  glipiZIDE (GLUCOTROL) 5 MG tablet Take 2.5 mg by mouth 2 (two) times daily.   Yes [provider]  lisinopril (ZESTRIL) 10 MG tablet Take 5 mg by mouth daily.   Yes [provider]  metFORMIN (GLUMETZA) 1000 MG (MOD) 24 hr tablet Take 1,000 mg by mouth 2 (two) times daily.   Yes [provider]  Multiple Vitamin (MULTI-VITAMIN) tablet Take 1 tablet by mouth daily.   Yes [provider]  omeprazole (PRILOSEC) 20 MG capsule  Take 20 mg by mouth 2 (two) times daily.   Yes [provider]  propranolol (INDERAL) 10 MG tablet Take 10 mg by mouth daily.   Yes [provider]  traZODone (DESYREL) 100 MG tablet Take 200 mg by mouth at bedtime.   Yes [provider]   Ct Abdomen Wo Contrast  Result Date: 12/10/2018 CLINICAL DATA:  Linear wire like metallic foreign body in the LEFT main pulmonary artery. EXAM: CT ABDOMEN WITHOUT CONTRAST TECHNIQUE: Multidetector CT imaging of the abdomen was performed following the standard protocol without IV contrast. COMPARISON:  CT chest same day FINDINGS: Lower chest:  Lung bases are clear. Hepatobiliary: No focal hepatic lesion.  Gallbladder normal Pancreas: Normal pancreatic parenchymal intensity. No ductal dilatation or inflammation. Spleen: Normal spleen. Adrenals/urinary tract: Adrenal glands and kidneys are normal. Stomach/Bowel: Stomach and limited of the small bowel is unremarkable Vascular/Lymphatic: Interval calcification of abdominal aorta. Infrarenal IVC filter in appropriate position. There is no evidence fracture of the filter. The struts appear intact. Musculoskeletal: No aggressive osseous lesion IMPRESSION: Infrarenal IVC filter in appropriate position. No hardware fracture evident. Electronically Signed   By: Suzy Bouchard M.D.   On: 12/10/2018 06:05   Dg Chest 2 View  Result Date: 12/09/2018 CLINICAL DATA:  Fall EXAM: CHEST - 2 VIEW COMPARISON:  None. FINDINGS: Basilar atelectatic changes. Question small granuloma versus vessel en face the right cardiophrenic sulcus. No consolidation, features of edema, pneumothorax, or effusion. Pulmonary vascularity is normally distributed. The cardiomediastinal contours are unremarkable. No acute osseous or soft tissue abnormality. Degenerative changes are present in the imaged spine and shoulders. 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  IMPRESSION: 1. No acute cardiopulmonary disease or traumatic finding in the chest. 2. Question small granuloma versus vessel en face in the right lung base. Electronically Signed   By: Lovena Le M.D.   On: 12/09/2018 22:34   Ct Head Wo Contrast  Result Date: 12/09/2018 CLINICAL DATA:  Found down outside of apartment EXAM: CT HEAD WITHOUT CONTRAST CT CERVICAL SPINE WITHOUT CONTRAST TECHNIQUE: Multidetector CT imaging of the head and cervical spine was performed following the standard protocol without intravenous contrast. Multiplanar CT image reconstructions of the cervical spine were also generated. COMPARISON:  None. FINDINGS: CT HEAD FINDINGS Brain: No evidence of acute infarction, hemorrhage, hydrocephalus, extra-axial collection or mass lesion/mass effect. Symmetric prominence of the ventricles, cisterns and sulci compatible with parenchymal volume loss. Patchy areas of white matter hypoattenuation are most compatible with chronic microvascular angiopathy. Vascular: Atherosclerotic calcification of the carotid siphons and intradural vertebral arteries. No hyperdense vessel. Skull: Mild right supraorbital soft tissue  swelling. No calvarial fracture or suspicious osseous lesion. Sinuses/Orbits: Leftward nasal septal deviation. Paranasal sinuses and mastoid air cells are predominantly clear. Pneumatization of the petrous apices. Small amount of debris in the right external auditory canal. Included orbital structures are unremarkable. Other: Edentulous. CT CERVICAL SPINE FINDINGS Alignment: Cervical stabilization collar in place. There is slight exaggeration of normal cervical lordosis. No traumatic listhesis. Craniocervical and atlantoaxial articulations are normally aligned. No abnormal facet widening. Skull base and vertebrae: No acute fracture. No primary bone lesion or focal pathologic process. From Soft tissues and spinal canal: No pre or paravertebral fluid or swelling. No visible canal hematoma. Disc  levels: Multilevel intervertebral disc height loss with spondylitic endplate changes and facet hypertrophic changes. Disc osteophyte complexes at C3-4, C4-5 and C6-7 result in at most mild canal stenosis. Uncinate spurring and facet hypertrophy results in mild-to-moderate multilevel foraminal narrowing throughout the cervical spine. Upper chest: No acute abnormality in the upper chest or imaged lung apices. Other: Included portions of the thyroid are unremarkable. There is calcification in the carotid bifurcations. IMPRESSION: 1. No acute intracranial abnormality. 2. Mild right supraorbital soft tissue swelling. 3. No acute cervical spine fracture or traumatic listhesis. 4. Multilevel cervical spondylitic changes, as above. Electronically Signed   By: Lovena Le M.D.   On: 12/09/2018 22:49   Ct Angio Chest Pe W And/or Wo Contrast  Result Date: 12/10/2018 CLINICAL DATA:  Fall, found outside apartment. History of blood clots. Positive D-dimer. EXAM: CT ANGIOGRAPHY CHEST WITH CONTRAST TECHNIQUE: Multidetector CT imaging of the chest was performed using the standard protocol during bolus administration of intravenous contrast. Multiplanar CT image reconstructions and MIPs were obtained to evaluate the vascular anatomy. CONTRAST:  167m OMNIPAQUE IOHEXOL 350 MG/ML SOLN COMPARISON:  Same day chest radiograph FINDINGS: Cardiovascular: Satisfactory opacification of the pulmonary arteries to the segmental level. No central, lobar or segmental filling defects are identified. There is a linear, metallic density along the posterior aspect of left main pulmonary artery approximately 3.6 cm in length. Atherosclerotic plaque within the normal caliber aorta. No periaortic stranding. Normal heart size. No pericardial effusion. Insert coronary Mediastinum/Nodes: No enlarged mediastinal or axillary lymph nodes. Thyroid gland, trachea, and esophagus demonstrate no significant findings. Few calcified right hilar nodes.  Lungs/Pleura: Dependent atelectasis seen posteriorly. No consolidation, features of edema, pneumothorax, or effusion. Calcified granuloma seen in the right lung base corresponding to the radiographic abnormality. No suspicious pulmonary nodules or masses. Upper Abdomen: No acute abnormalities present in the visualized portions of the upper abdomen. Musculoskeletal: Tiny hypoattenuating 1 cm structure associated with the epidermal air along the right chest wall likely reflecting an epidermal inclusion cyst. No acute osseous abnormality or suspicious osseous lesion. Multilevel degenerative changes are present in the imaged portions of the spine. 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 Remote posttraumatic deformity of the right anterior fifth rib. Review of the MIP images confirms the above findings. IMPRESSION: 1. No evidence of pulmonary embolism. No acute intrathoracic process. 2. Linear, metallic density along the posterior aspect of the left main pulmonary artery approximately 3.6 cm in length, favored to represent a metallic foreign body. Question the presence of an inferior vena cava filter on lateral chest radiograph, could reflect caval filter strut or fragmented guidewire. Could consider abdominal radiograph to confirm the presence of a caval filter and if present CT of the abdomen and pelvis would need to be obtained to assess the structural integrity as this will likely require retrieval. 3.  Evidence of remote granulomatous disease in the chest. 4. Aortic Atherosclerosis (ICD10-I70.0). 5. Coronary atherosclerosis. These results were called by telephone at the time of interpretation on 12/10/2018 at 3:26 am to provider Montine Circle , who verbally acknowledged these results. Electronically Signed   By: Lovena Le M.D.   On: 12/10/2018 03:26   Ct Cervical Spine Wo Contrast  Result Date: 12/09/2018 CLINICAL DATA:  Found down  outside of apartment EXAM: CT HEAD WITHOUT CONTRAST CT CERVICAL SPINE WITHOUT CONTRAST TECHNIQUE: Multidetector CT imaging of the head and cervical spine was performed following the standard protocol without intravenous contrast. Multiplanar CT image reconstructions of the cervical spine were also generated. COMPARISON:  None. FINDINGS: CT HEAD FINDINGS Brain: No evidence of acute infarction, hemorrhage, hydrocephalus, extra-axial collection or mass lesion/mass effect. Symmetric prominence of the ventricles, cisterns and sulci compatible with parenchymal volume loss. Patchy areas of white matter hypoattenuation are most compatible with chronic microvascular angiopathy. Vascular: Atherosclerotic calcification of the carotid siphons and intradural vertebral arteries. No hyperdense vessel. Skull: Mild right supraorbital soft tissue swelling. No calvarial fracture or suspicious osseous lesion. Sinuses/Orbits: Leftward nasal septal deviation. Paranasal sinuses and mastoid air cells are predominantly clear. Pneumatization of the petrous apices. Small amount of debris in the right external auditory canal. Included orbital structures are unremarkable. Other: Edentulous. CT CERVICAL SPINE FINDINGS Alignment: Cervical stabilization collar in place. There is slight exaggeration of normal cervical lordosis. No traumatic listhesis. Craniocervical and atlantoaxial articulations are normally aligned. No abnormal facet widening. Skull base and vertebrae: No acute fracture. No primary bone lesion or focal pathologic process. From Soft tissues and spinal canal: No pre or paravertebral fluid or swelling. No visible canal hematoma. Disc levels: Multilevel intervertebral disc height loss with spondylitic endplate changes and facet hypertrophic changes. Disc osteophyte complexes at C3-4, C4-5 and C6-7 result in at most mild canal stenosis. Uncinate spurring and facet hypertrophy results in mild-to-moderate multilevel foraminal narrowing  throughout the cervical spine. Upper chest: No acute abnormality in the upper chest or imaged lung apices. Other: Included portions of the thyroid are unremarkable. There is calcification in the carotid bifurcations. IMPRESSION: 1. No acute intracranial abnormality. 2. Mild right supraorbital soft tissue swelling. 3. No acute cervical spine fracture or traumatic listhesis. 4. Multilevel cervical spondylitic changes, as above. Electronically Signed   By: Lovena Le M.D.   On: 12/09/2018 22:49   Dg Abd 2 Views  Result Date: 12/10/2018 CLINICAL DATA:  IVC filter location. EXAM: ABDOMEN - 2 VIEW COMPARISON:  None. FINDINGS: There is an IVC filter at the level of the renal vessels. There is excreted contrast within the renal pelvises and right ureter. Bowel gas pattern is normal. IMPRESSION: IVC filter at the level of the renal vessels. Electronically Signed   By: Ulyses Jarred M.D.   On: 12/10/2018 04:11   Dg Foot Complete Right  Result Date: 12/10/2018 CLINICAL DATA:  Redness, blister, and swelling on right foot, and 1-3rd MTPs. Pt denies recent injury. Hx DVT on left leg EXAM: RIGHT FOOT COMPLETE - 3+ VIEW COMPARISON:  None. FINDINGS: There is a mildly displaced fracture at the base of the second metatarsal with a small amount of lateral displacement of the metatarsal. No definite additional fracture in the adjacent metatarsals. Scattered degenerative changes. No focal bony lesion. Swelling about the anterior midfoot. IMPRESSION: Mildly displaced fracture at the base of the second metatarsal with a mild lateral displacement of the metatarsal, raising concern for Lisfranc injury. Recommend orthopedic consultation and possible CT  for further evaluation. Electronically Signed   By: Audie Pinto M.D.   On: 12/10/2018 10:08   Family History Reviewed and non-contributory, no pertinent history of problems with bleeding or anesthesia      Review of Systems 14 system ROS conducted and negative except  for that noted in HPI   OBJECTIVE  Vitals: Patient Vitals for the past 8 hrs:  BP Temp Temp src Pulse Resp SpO2  12/10/18 1324 (!) 144/82 98.2 F (36.8 C) Oral 85 16 96 %   General: Alert, no acute distress Cardiovascular: Warm extremities noted Respiratory: No cyanosis, no use of accessory musculature GI: No organomegaly, abdomen is soft and non-tender Skin: No lesions in the area of chief complaint other than those listed below in MSK exam. Splint on right lower extremity.  Neurologic: Sensation intact distally  Psychiatric: Patient is competent for consent with normal mood and affect Lymphatic: No swelling obvious and reported  Extremities  RUE: full range of motion. Neurovascularly intact.  No pain throughout upper extremity.  ZWC:HENI range of motion. Neurovascularly intact.  No pain throughout upper extremity.  RLE: Splint in place. Not tender to palpation throughout knee and proximal tibia/fibula. Neurovascularly intact distally. Normal range of motion of toes.  LLE: full range of motion. Neurovascularly intact. Statis dermatitis of left lower extremity. No pedal edema.     Test Results Imaging - X-ray: 3 views of the right foot were reviewed. X-rays demonstrate Mildly displaced fracture at the base of the second metatarsal with a mild lateral displacement of the metatarsal, raising concern for Lisfranc injury.  Labs cbc Recent Labs    12/09/18 2304  WBC 8.4  HGB 13.7  HCT 40.1  PLT 146*    Labs inflam No results for input(s): CRP in the last 72 hours.  Invalid input(s): ESR  Labs coag Recent Labs    12/09/18 2304  INR 1.1    Recent Labs    12/09/18 2304  NA 135  K 4.0  CL 101  CO2 23  GLUCOSE 213*  BUN 14  CREATININE 0.89  CALCIUM 8.9     ASSESSMENT AND PLAN: 68 y.o. male with the following: mildly displaced fracture at the base of the second metatarsal with mild lateral displacement, concern for possible Lisfranc injury.   The case was  discussed with Dr. Radene Journey. Will proceed with non-operative treatment at this time and close follow-up.  Patient was placed in a short leg splint. He will remain in the splint until his outpatient follow-up with Dr. Radene Journey. Instructed to keep his splint clean and dry.   - Weight Bearing Status/Activity: NWB RLE  -VTE Prophylaxis: Per Hospitalist note, patient will continue Eliquis   - Follow-up plan: Dr. Radene Journey following discharge  Patient is clear to be discharged from orthopedic standpoint. Discharge date per Hospitalist recommendation.

## 2018-12-10 NOTE — Consult Note (Addendum)
Chief Complaint: Patient was seen in consultation today for foreign body  Referring Physician(s): Dr. Posey Pronto  Supervising Physician: Arne Cleveland  Patient Status: [facility] - In-pt  History of Present Illness: Randall Coleman is a 68 y.o. male with past medical history of DM, DVT, HTN who presented to [facility] ED after a syncopal event at home.  Patient being treated for mildly displaced metatarasal fracture as well as to identify the cause of his syncope/fall. Work-up includeded a CTA Chest which showed: 1. No evidence of pulmonary embolism. No acute intrathoracic process. 2. Linear, metallic density along the posterior aspect of the left main pulmonary artery approximately 3.6 cm in length, favored to represent a metallic foreign body. Question the presence of an inferior vena cava filter on lateral chest radiograph, could reflect caval filter strut or fragmented guidewire. Could consider abdominal radiograph to confirm the presence of a caval filter and if present CT of the abdomen and pelvis would need to be obtained to assess the structural integrity as this will likely require retrieval. 3. Evidence of remote granulomatous disease in the chest. 4. Aortic Atherosclerosis (ICD10-I70.0). 5. Coronary atherosclerosis.  IR consulted for evaluation and possible foreign body removal.   PA met with patient at bedside.  He initially states he was not aware he had filter and did not know anything about this potential foreign body.  After some conversation he interrupts to say "I need to tell you something before I forget.  I got a phone from my doctor saying that he didn't know how this had happened but that something was in there.  The big doctor told the little doctor it might not be anything to worry about."  Patient is alert and oriented.  He's able to describe events and general conversations he's had with providers, however he seems to have a general lack of knowledge about his health or  care plans, and has limited insight into both.  He thinks he is getting work up for this potentially at the [place].  States he's had several blood clots but is not aware of any diagnosis of a fib although he is irregular irregular on my exam today.   Past Medical History:  Diagnosis Date   Diabetes mellitus without complication ([place])    DVT (deep venous thrombosis) ([place])    Hypertension     Past Surgical History:  Procedure Laterality Date   HERNIA REPAIR     HERNIA REPAIR      Allergies: Patient has no known allergies.  Medications: Prior to Admission medications   Medication Sig Start Date End Date Taking? Authorizing Provider  apixaban (ELIQUIS) 5 MG TABS tablet Take 5 mg by mouth 2 (two) times daily.   Yes [provider]  ARIPiprazole (ABILIFY) 20 MG tablet Take 20 mg by mouth daily.   Yes [provider]  aspirin EC 81 MG tablet Take 81 mg by mouth daily.   Yes [provider]  benztropine (COGENTIN) 1 MG tablet Take 1 mg by mouth daily.   Yes [provider]  buPROPion (WELLBUTRIN XL) 150 MG 24 hr tablet Take 150 mg by mouth daily.   Yes [provider]  busPIRone (BUSPAR) 15 MG tablet Take 15 mg by mouth 3 (three) times daily.   Yes [provider]  gabapentin (NEURONTIN) 300 MG capsule Take 300 mg by mouth 3 (three) times daily. 03/15/17  Yes [provider]  glipiZIDE (GLUCOTROL) 5 MG tablet Take 2.5 mg by mouth 2 (two)  times daily.   Yes [provider]  lisinopril (ZESTRIL) 10 MG tablet Take 5 mg by mouth daily.   Yes [provider]  metFORMIN (GLUMETZA) 1000 MG (MOD) 24 hr tablet Take 1,000 mg by mouth 2 (two) times daily.   Yes [provider]  Multiple Vitamin (MULTI-VITAMIN) tablet Take 1 tablet by mouth daily.   Yes [provider]  omeprazole (PRILOSEC) 20 MG capsule Take 20 mg by mouth 2 (two) times daily.   Yes [provider]  propranolol (INDERAL) 10 MG  tablet Take 10 mg by mouth daily.   Yes [provider]  traZODone (DESYREL) 100 MG tablet Take 200 mg by mouth at bedtime.   Yes [provider]     History reviewed. No pertinent family history.  Social History   Socioeconomic History   Marital status: Single    Spouse name: Not on file   Number of children: Not on file   Years of education: Not on file   Highest education level: Not on file  Occupational History   Occupation: retired  Scientist, product/process development strain: Not on file   Food insecurity    Worry: Not on file    Inability: Not on Lexicographer needs    Medical: Not on file    Non-medical: Not on file  Tobacco Use   Smoking status: Current Every Day Smoker    Packs/day: 1.00    Years: 54.00    Pack years: 54.00   Smokeless tobacco: Never Used  Substance and Sexual Activity   Alcohol use: Not Currently    Frequency: Never    Comment: h/o heavy use, quit about 10 years ago   Drug use: Never    Comment: used when he was younger   Sexual activity: Not Currently  Lifestyle   Physical activity    Days per week: Not on file    Minutes per session: Not on file   Stress: Not on file  Relationships   Social connections    Talks on phone: Not on file    Gets together: Not on file    Attends religious service: Not on file    Active member of club or organization: Not on file    Attends meetings of clubs or organizations: Not on file    Relationship status: Not on file  Other Topics Concern   Not on file  Social History Narrative   Not on file     Review of Systems: A 12 point ROS discussed and pertinent positives are indicated in the HPI above.  All other systems are negative.  Review of Systems  Constitutional: Negative for fatigue and fever.  Respiratory: Negative for cough and shortness of breath.   Cardiovascular: Negative for chest pain.  Gastrointestinal: Negative for abdominal pain.    Musculoskeletal: Negative for back pain.  Psychiatric/Behavioral: Negative for behavioral problems and confusion.    Vital Signs: BP (!) 144/82 (BP Location: Left Arm)    Pulse 85    Temp 98.2 F (36.8 C) (Oral)    Resp 16    Ht '6\' 2"'  (1.88 m)    Wt 240 lb (108.9 kg)    SpO2 96%    BMI 30.81 kg/m   Physical Exam Vitals signs and nursing note reviewed.  Constitutional:      General: He is not in acute distress. HENT:     Mouth/Throat:     Mouth: Mucous membranes are moist.  Pharynx: Oropharynx is clear.  Cardiovascular:     Comments: Irregular irregular Pulmonary:     Effort: Pulmonary effort is normal. No respiratory distress.     Breath sounds: Normal breath sounds.  Abdominal:     General: Abdomen is flat.     Palpations: Abdomen is soft.  Skin:    General: Skin is warm and dry.  Neurological:     General: No focal deficit present.     Mental Status: He is alert and oriented to person, place, and time. Mental status is at baseline.  Psychiatric:        Mood and Affect: Mood normal.        Behavior: Behavior normal.        Thought Content: Thought content normal.        Judgment: Judgment normal.      MD Evaluation Airway: WNL Heart: Other (comments) Heart  comments: a fib Abdomen: WNL Chest/ Lungs: WNL ASA  Classification: 3 Mallampati/Airway Score: Two   Imaging: Ct Abdomen Wo Contrast  Result Date: 12/10/2018 CLINICAL DATA:  Linear wire like metallic foreign body in the LEFT main pulmonary artery. EXAM: CT ABDOMEN WITHOUT CONTRAST TECHNIQUE: Multidetector CT imaging of the abdomen was performed following the standard protocol without IV contrast. COMPARISON:  CT chest same day FINDINGS: Lower chest:  Lung bases are clear. Hepatobiliary: No focal hepatic lesion.  Gallbladder normal Pancreas: Normal pancreatic parenchymal intensity. No ductal dilatation or inflammation. Spleen: Normal spleen. Adrenals/urinary tract: Adrenal glands and kidneys are normal.  Stomach/Bowel: Stomach and limited of the small bowel is unremarkable Vascular/Lymphatic: Interval calcification of abdominal aorta. Infrarenal IVC filter in appropriate position. There is no evidence fracture of the filter. The struts appear intact. Musculoskeletal: No aggressive osseous lesion IMPRESSION: Infrarenal IVC filter in appropriate position. No hardware fracture evident. Electronically Signed   By: Suzy Bouchard M.D.   On: 12/10/2018 06:05   Dg Chest 2 View  Result Date: 12/09/2018 CLINICAL DATA:  Fall EXAM: CHEST - 2 VIEW COMPARISON:  None. FINDINGS: Basilar atelectatic changes. Question small granuloma versus vessel en face the right cardiophrenic sulcus. No consolidation, features of edema, pneumothorax, or effusion. Pulmonary vascularity is normally distributed. The cardiomediastinal contours are unremarkable. No acute osseous or soft tissue abnormality. Degenerative changes are present in the imaged spine and shoulders. 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 IMPRESSION: 1. No acute cardiopulmonary disease or traumatic finding in the chest. 2. Question small granuloma versus vessel en face in the right lung base. Electronically Signed   By: Lovena Le M.D.   On: 12/09/2018 22:34   Ct Head Wo Contrast  Result Date: 12/09/2018 CLINICAL DATA:  Found down outside of apartment EXAM: CT HEAD WITHOUT CONTRAST CT CERVICAL SPINE WITHOUT CONTRAST TECHNIQUE: Multidetector CT imaging of the head and cervical spine was performed following the standard protocol without intravenous contrast. Multiplanar CT image reconstructions of the cervical spine were also generated. COMPARISON:  None. FINDINGS: CT HEAD FINDINGS Brain: No evidence of acute infarction, hemorrhage, hydrocephalus, extra-axial collection or mass lesion/mass effect. Symmetric prominence of the ventricles, cisterns and sulci compatible with parenchymal volume  loss. Patchy areas of white matter hypoattenuation are most compatible with chronic microvascular angiopathy. Vascular: Atherosclerotic calcification of the carotid siphons and intradural vertebral arteries. No hyperdense vessel. Skull: Mild right supraorbital soft tissue swelling. No calvarial fracture or suspicious osseous lesion. Sinuses/Orbits: Leftward nasal septal deviation. Paranasal sinuses and mastoid air cells are predominantly clear. Pneumatization of the  petrous apices. Small amount of debris in the right external auditory canal. Included orbital structures are unremarkable. Other: Edentulous. CT CERVICAL SPINE FINDINGS Alignment: Cervical stabilization collar in place. There is slight exaggeration of normal cervical lordosis. No traumatic listhesis. Craniocervical and atlantoaxial articulations are normally aligned. No abnormal facet widening. Skull base and vertebrae: No acute fracture. No primary bone lesion or focal pathologic process. From Soft tissues and spinal canal: No pre or paravertebral fluid or swelling. No visible canal hematoma. Disc levels: Multilevel intervertebral disc height loss with spondylitic endplate changes and facet hypertrophic changes. Disc osteophyte complexes at C3-4, C4-5 and C6-7 result in at most mild canal stenosis. Uncinate spurring and facet hypertrophy results in mild-to-moderate multilevel foraminal narrowing throughout the cervical spine. Upper chest: No acute abnormality in the upper chest or imaged lung apices. Other: Included portions of the thyroid are unremarkable. There is calcification in the carotid bifurcations. IMPRESSION: 1. No acute intracranial abnormality. 2. Mild right supraorbital soft tissue swelling. 3. No acute cervical spine fracture or traumatic listhesis. 4. Multilevel cervical spondylitic changes, as above. Electronically Signed   By: Lovena Le M.D.   On: 12/09/2018 22:49   Ct Angio Chest Pe W And/or Wo Contrast  Result Date:  12/10/2018 CLINICAL DATA:  Fall, found outside apartment. History of blood clots. Positive D-dimer. EXAM: CT ANGIOGRAPHY CHEST WITH CONTRAST TECHNIQUE: Multidetector CT imaging of the chest was performed using the standard protocol during bolus administration of intravenous contrast. Multiplanar CT image reconstructions and MIPs were obtained to evaluate the vascular anatomy. CONTRAST:  11m OMNIPAQUE IOHEXOL 350 MG/ML SOLN COMPARISON:  Same day chest radiograph FINDINGS: Cardiovascular: Satisfactory opacification of the pulmonary arteries to the segmental level. No central, lobar or segmental filling defects are identified. There is a linear, metallic density along the posterior aspect of left main pulmonary artery approximately 3.6 cm in length. Atherosclerotic plaque within the normal caliber aorta. No periaortic stranding. Normal heart size. No pericardial effusion. Insert coronary Mediastinum/Nodes: No enlarged mediastinal or axillary lymph nodes. Thyroid gland, trachea, and esophagus demonstrate no significant findings. Few calcified right hilar nodes. Lungs/Pleura: Dependent atelectasis seen posteriorly. No consolidation, features of edema, pneumothorax, or effusion. Calcified granuloma seen in the right lung base corresponding to the radiographic abnormality. No suspicious pulmonary nodules or masses. Upper Abdomen: No acute abnormalities present in the visualized portions of the upper abdomen. Musculoskeletal: Tiny hypoattenuating 1 cm structure associated with the epidermal air along the right chest wall likely reflecting an epidermal inclusion cyst. No acute osseous abnormality or suspicious osseous lesion. Multilevel degenerative changes are present in the imaged portions of the spine. 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 Remote posttraumatic deformity of the right anterior fifth rib. Review of the MIP images confirms the  above findings. IMPRESSION: 1. No evidence of pulmonary embolism. No acute intrathoracic process. 2. Linear, metallic density along the posterior aspect of the left main pulmonary artery approximately 3.6 cm in length, favored to represent a metallic foreign body. Question the presence of an inferior vena cava filter on lateral chest radiograph, could reflect caval filter strut or fragmented guidewire. Could consider abdominal radiograph to confirm the presence of a caval filter and if present CT of the abdomen and pelvis would need to be obtained to assess the structural integrity as this will likely require retrieval. 3. Evidence of remote granulomatous disease in the chest. 4. Aortic Atherosclerosis (ICD10-I70.0). 5. Coronary atherosclerosis. These results were called by telephone at the time of  interpretation on 12/10/2018 at 3:26 am to provider Montine Circle , who verbally acknowledged these results. Electronically Signed   By: Lovena Le M.D.   On: 12/10/2018 03:26   Ct Cervical Spine Wo Contrast  Result Date: 12/09/2018 CLINICAL DATA:  Found down outside of apartment EXAM: CT HEAD WITHOUT CONTRAST CT CERVICAL SPINE WITHOUT CONTRAST TECHNIQUE: Multidetector CT imaging of the head and cervical spine was performed following the standard protocol without intravenous contrast. Multiplanar CT image reconstructions of the cervical spine were also generated. COMPARISON:  None. FINDINGS: CT HEAD FINDINGS Brain: No evidence of acute infarction, hemorrhage, hydrocephalus, extra-axial collection or mass lesion/mass effect. Symmetric prominence of the ventricles, cisterns and sulci compatible with parenchymal volume loss. Patchy areas of white matter hypoattenuation are most compatible with chronic microvascular angiopathy. Vascular: Atherosclerotic calcification of the carotid siphons and intradural vertebral arteries. No hyperdense vessel. Skull: Mild right supraorbital soft tissue swelling. No calvarial  fracture or suspicious osseous lesion. Sinuses/Orbits: Leftward nasal septal deviation. Paranasal sinuses and mastoid air cells are predominantly clear. Pneumatization of the petrous apices. Small amount of debris in the right external auditory canal. Included orbital structures are unremarkable. Other: Edentulous. CT CERVICAL SPINE FINDINGS Alignment: Cervical stabilization collar in place. There is slight exaggeration of normal cervical lordosis. No traumatic listhesis. Craniocervical and atlantoaxial articulations are normally aligned. No abnormal facet widening. Skull base and vertebrae: No acute fracture. No primary bone lesion or focal pathologic process. From Soft tissues and spinal canal: No pre or paravertebral fluid or swelling. No visible canal hematoma. Disc levels: Multilevel intervertebral disc height loss with spondylitic endplate changes and facet hypertrophic changes. Disc osteophyte complexes at C3-4, C4-5 and C6-7 result in at most mild canal stenosis. Uncinate spurring and facet hypertrophy results in mild-to-moderate multilevel foraminal narrowing throughout the cervical spine. Upper chest: No acute abnormality in the upper chest or imaged lung apices. Other: Included portions of the thyroid are unremarkable. There is calcification in the carotid bifurcations. IMPRESSION: 1. No acute intracranial abnormality. 2. Mild right supraorbital soft tissue swelling. 3. No acute cervical spine fracture or traumatic listhesis. 4. Multilevel cervical spondylitic changes, as above. Electronically Signed   By: Lovena Le M.D.   On: 12/09/2018 22:49   Dg Abd 2 Views  Result Date: 12/10/2018 CLINICAL DATA:  IVC filter location. EXAM: ABDOMEN - 2 VIEW COMPARISON:  None. FINDINGS: There is an IVC filter at the level of the renal vessels. There is excreted contrast within the renal pelvises and right ureter. Bowel gas pattern is normal. IMPRESSION: IVC filter at the level of the renal vessels.  Electronically Signed   By: Ulyses Jarred M.D.   On: 12/10/2018 04:11   Dg Foot Complete Right  Result Date: 12/10/2018 CLINICAL DATA:  Redness, blister, and swelling on right foot, and 1-3rd MTPs. Pt denies recent injury. Hx DVT on left leg EXAM: RIGHT FOOT COMPLETE - 3+ VIEW COMPARISON:  None. FINDINGS: There is a mildly displaced fracture at the base of the second metatarsal with a small amount of lateral displacement of the metatarsal. No definite additional fracture in the adjacent metatarsals. Scattered degenerative changes. No focal bony lesion. Swelling about the anterior midfoot. IMPRESSION: Mildly displaced fracture at the base of the second metatarsal with a mild lateral displacement of the metatarsal, raising concern for Lisfranc injury. Recommend orthopedic consultation and possible CT for further evaluation. Electronically Signed   By: Audie Pinto M.D.   On: 12/10/2018 10:08    Labs:  CBC: Recent Labs  12/09/18 2304  WBC 8.4  HGB 13.7  HCT 40.1  PLT 146*    COAGS: Recent Labs    12/09/18 2304  INR 1.1    BMP: Recent Labs    12/09/18 2304  NA 135  K 4.0  CL 101  CO2 23  GLUCOSE 213*  BUN 14  CALCIUM 8.9  CREATININE 0.89  GFRNONAA >60  GFRAA >60    LIVER FUNCTION TESTS: Recent Labs    12/09/18 2304  BILITOT 0.8  AST 28  ALT 28  ALKPHOS 41  PROT 6.4*  ALBUMIN 4.2    TUMOR MARKERS: No results for input(s): AFPTM, CEA, CA199, CHROMGRNA in the last 8760 hours.  Assessment and Plan: Foreign body of the pulmonary artery Patient with possible foreign body of the pulmonary artery.  IR consulted for evaluation and possible removal.  Dr. Vernard Gambles has reviewed imaging and notes the presence by CT, but difficulty to visualize by CXR.  Pulmonary angiogram with retrieval may be possible, however visualization potentially difficult.  Will review case with IR MD tomorrow for ongoing care/procedure planning. Patient is agreeable to intervention if  appropriate and provides consent. NPO p MN.  Got a dose of Eliquis today.  Held for now.  Dr. Lorin Mercy paged.  Risks and benefits of angiogram with foreign body removal were discussed with the patient including, but not limited to bleeding, infection, vascular injury or contrast induced renal failure.  This interventional procedure involves the use of X-rays and because of the nature of the planned procedure, it is possible that we will have prolonged use of X-ray fluoroscopy.  Potential radiation risks to you include (but are not limited to) the following: - A slightly elevated risk for cancer  several years later in life. This risk is typically less than 0.5% percent. This risk is low in comparison to the normal incidence of human cancer, which is 33% for women and 50% for men according to the [employer]. - Radiation induced injury can include skin redness, resembling a rash, tissue breakdown / ulcers and hair loss (which can be temporary or permanent).   The likelihood of either of these occurring depends on the difficulty of the procedure and whether you are sensitive to radiation due to previous procedures, disease, or genetic conditions.   IF your procedure requires a prolonged use of radiation, you will be notified and given written instructions for further action.  It is your responsibility to monitor the irradiated area for the 2 weeks following the procedure and to notify your physician if you are concerned that you have suffered a radiation induced injury.    All of the patient's questions were answered, patient is agreeable to proceed.  Consent signed and in chart.  Thank you for this interesting consult.  I greatly enjoyed meeting Jquan Egelston and look forward to participating in their care.  A copy of this report was sent to the requesting provider on this date.  Electronically Signed: Docia Barrier, PA 12/10/2018, 4:47 PM   I spent a total of 40  Minutes    in face to face in clinical consultation, greater than 50% of which was counseling/coordinating care for foreign body in pulmonary artery.

## 2018-12-11 DIAGNOSIS — I2510 Atherosclerotic heart disease of native coronary artery without angina pectoris: Secondary | ICD-10-CM | POA: Diagnosis present

## 2018-12-11 DIAGNOSIS — F17210 Nicotine dependence, cigarettes, uncomplicated: Secondary | ICD-10-CM | POA: Diagnosis present

## 2018-12-11 DIAGNOSIS — W19XXXA Unspecified fall, initial encounter: Secondary | ICD-10-CM

## 2018-12-11 DIAGNOSIS — T81509A Unspecified complication of foreign body accidentally left in body following unspecified procedure, initial encounter: Secondary | ICD-10-CM

## 2018-12-11 DIAGNOSIS — F4310 Post-traumatic stress disorder, unspecified: Secondary | ICD-10-CM | POA: Diagnosis present

## 2018-12-11 DIAGNOSIS — I77810 Thoracic aortic ectasia: Secondary | ICD-10-CM | POA: Diagnosis present

## 2018-12-11 DIAGNOSIS — Z7984 Long term (current) use of oral hypoglycemic drugs: Secondary | ICD-10-CM | POA: Diagnosis not present

## 2018-12-11 DIAGNOSIS — E119 Type 2 diabetes mellitus without complications: Secondary | ICD-10-CM | POA: Diagnosis not present

## 2018-12-11 DIAGNOSIS — I10 Essential (primary) hypertension: Secondary | ICD-10-CM

## 2018-12-11 DIAGNOSIS — S99921A Unspecified injury of right foot, initial encounter: Secondary | ICD-10-CM | POA: Diagnosis not present

## 2018-12-11 DIAGNOSIS — Z7901 Long term (current) use of anticoagulants: Secondary | ICD-10-CM | POA: Diagnosis not present

## 2018-12-11 DIAGNOSIS — S92901A Unspecified fracture of right foot, initial encounter for closed fracture: Secondary | ICD-10-CM | POA: Diagnosis present

## 2018-12-11 DIAGNOSIS — Z79899 Other long term (current) drug therapy: Secondary | ICD-10-CM | POA: Diagnosis not present

## 2018-12-11 DIAGNOSIS — E1142 Type 2 diabetes mellitus with diabetic polyneuropathy: Secondary | ICD-10-CM | POA: Diagnosis present

## 2018-12-11 DIAGNOSIS — E1165 Type 2 diabetes mellitus with hyperglycemia: Secondary | ICD-10-CM | POA: Diagnosis present

## 2018-12-11 DIAGNOSIS — F329 Major depressive disorder, single episode, unspecified: Secondary | ICD-10-CM | POA: Diagnosis present

## 2018-12-11 DIAGNOSIS — I700 Atherosclerosis of aorta: Secondary | ICD-10-CM | POA: Diagnosis present

## 2018-12-11 DIAGNOSIS — R55 Syncope and collapse: Secondary | ICD-10-CM | POA: Diagnosis present

## 2018-12-11 DIAGNOSIS — Z95828 Presence of other vascular implants and grafts: Secondary | ICD-10-CM | POA: Diagnosis not present

## 2018-12-11 DIAGNOSIS — R7989 Other specified abnormal findings of blood chemistry: Secondary | ICD-10-CM | POA: Diagnosis present

## 2018-12-11 DIAGNOSIS — W1830XA Fall on same level, unspecified, initial encounter: Secondary | ICD-10-CM | POA: Diagnosis present

## 2018-12-11 DIAGNOSIS — Z7982 Long term (current) use of aspirin: Secondary | ICD-10-CM | POA: Diagnosis not present

## 2018-12-11 DIAGNOSIS — S92901D Unspecified fracture of right foot, subsequent encounter for fracture with routine healing: Secondary | ICD-10-CM | POA: Diagnosis not present

## 2018-12-11 DIAGNOSIS — T17808D Unspecified foreign body in other parts of respiratory tract causing other injury, subsequent encounter: Secondary | ICD-10-CM | POA: Diagnosis not present

## 2018-12-11 DIAGNOSIS — R531 Weakness: Secondary | ICD-10-CM | POA: Diagnosis not present

## 2018-12-11 DIAGNOSIS — Z86718 Personal history of other venous thrombosis and embolism: Secondary | ICD-10-CM | POA: Diagnosis not present

## 2018-12-11 DIAGNOSIS — S92321A Displaced fracture of second metatarsal bone, right foot, initial encounter for closed fracture: Secondary | ICD-10-CM | POA: Diagnosis present

## 2018-12-11 DIAGNOSIS — Z20828 Contact with and (suspected) exposure to other viral communicable diseases: Secondary | ICD-10-CM | POA: Diagnosis present

## 2018-12-11 DIAGNOSIS — I4891 Unspecified atrial fibrillation: Secondary | ICD-10-CM | POA: Diagnosis present

## 2018-12-11 DIAGNOSIS — Z79891 Long term (current) use of opiate analgesic: Secondary | ICD-10-CM | POA: Diagnosis not present

## 2018-12-11 LAB — CBC
HCT: 40.1 % (ref 39.0–52.0)
Hemoglobin: 13.4 g/dL (ref 13.0–17.0)
MCH: 28.4 pg (ref 26.0–34.0)
MCHC: 33.4 g/dL (ref 30.0–36.0)
MCV: 85 fL (ref 80.0–100.0)
Platelets: 128 10*3/uL — ABNORMAL LOW (ref 150–400)
RBC: 4.72 MIL/uL (ref 4.22–5.81)
RDW: 12.8 % (ref 11.5–15.5)
WBC: 6.5 10*3/uL (ref 4.0–10.5)
nRBC: 0 % (ref 0.0–0.2)

## 2018-12-11 LAB — BASIC METABOLIC PANEL
Anion gap: 12 (ref 5–15)
BUN: 13 mg/dL (ref 8–23)
CO2: 22 mmol/L (ref 22–32)
Calcium: 8.6 mg/dL — ABNORMAL LOW (ref 8.9–10.3)
Chloride: 102 mmol/L (ref 98–111)
Creatinine, Ser: 0.7 mg/dL (ref 0.61–1.24)
GFR calc Af Amer: >60 mL/min (ref >60)
GFR calc non Af Amer: >60 mL/min (ref >60)
Glucose, Bld: 266 mg/dL — ABNORMAL HIGH (ref 70–99)
Potassium: 4 mmol/L (ref 3.5–5.1)
Sodium: 136 mmol/L (ref 135–145)

## 2018-12-11 LAB — GLUCOSE, CAPILLARY
Glucose-Capillary: 275 mg/dL — ABNORMAL HIGH (ref 70–99)
Glucose-Capillary: 197 mg/dL — ABNORMAL HIGH (ref 70–99)
Glucose-Capillary: 247 mg/dL — ABNORMAL HIGH (ref 70–99)
Glucose-Capillary: 229 mg/dL — ABNORMAL HIGH (ref 70–99)

## 2018-12-11 MED ORDER — APIXABAN 5 MG PO TABS: 5.0000 mg | ORAL_TABLET | Freq: Two times a day (BID) | ORAL | Status: DC

## 2018-12-11 NOTE — Progress Notes (Signed)
OT Cancellation Note  Patient Details Name: Randall Coleman MRN: [id number] DOB: May 23, 1950   Cancelled Treatment:    Reason Eval/Treat Not Completed: Other (comment)   Noted note from PT recommending SNF as pt lives alone and is not NWB. Will defer OT eval to SNF  Kari Baars, [place] [phone] Office- [phone], Thereasa Parkin 12/11/2018, 6:35 PM

## 2018-12-11 NOTE — Progress Notes (Addendum)
Patient ID: Randall Coleman, male   DOB: 03/25/1950, 68 y.o.   MRN: [id number] Case reviewed by Dr. Pascal Lux; Pt's IVC filter is nonretrievable version and has been in place at least 2 years (outside records not available); will tent plan to put on IR schedule for possible retrieval of PA fragment on 10/14; hold eliquis until after procedure; aspirin ok.

## 2018-12-11 NOTE — Progress Notes (Signed)
[facility]                                                                              Patient Demographics  Randall Coleman, is a 68 y.o. male, DOB - 02-Sep-1950, AP:[id number]  Admit date - 12/09/2018   Admitting Physician Karmen Bongo, MD  Outpatient Primary MD for the patient is System, Pcp Not In  Outpatient specialists:   LOS - 0  days   Medical records reviewed and are as summarized below:    Chief Complaint  Patient presents with   Fall       Brief summary   Patient is a 68 year old male with a history of hypertension, diabetes, DVT, lives alone in an apartment, presented with fall and syncopal episode.  Patient reported that he had a fall and syncopal episode.  Patient reported that he had increasing weakness over the past few days, felt little dizzy and fell, denied striking his head.  No chest pain or shortness of breath.  Prior history of DVT, now on Eliquis. Patient's property manager reported that he was walking through the apartment after getting the groceries and fell twice possibly legs gave out.  Patient was lying on the concrete next to the car and was disoriented.  Patient's with head was bleeding and elbows looked bruised. In ED, CT head negative, troponin high-sensitivity mildly elevated.  EKG negative.  D-dimer elevated and CT angiogram was done which was negative for PE, showed metallic foreign body in the right pulmonary artery.  IR was consulted.  Assessment & Plan    Principal Problem:   Syncope and collapse -Poor historian however has been feeling somewhat weak in the last few days.  EKG with no acute ST-T wave changes suggestive of ischemia -TSH 1.06 -Mildly elevated high-sensitivity troponin, 2D echo showed EF of 60 to 65%, severely increased LVH.  Dilated ascending aorta 4 cm. -CT angiogram showed no PE  Active Problems:  Mildly dilated ascending aorta, 4 cm -Incidental finding on CT angiogram of the chest will need  outpatient follow-up with PCP and referral to vascular surgery for close monitoring  Foreign body in the pulmonary artery -CT angiogram chest showed linear metallic density in the left main pulmonary artery.  IR was consulted, case reviewed by Dr. 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  Plan for possible retrieval of the PA fragment on 10/14, hold Eliquis until after the procedure.  Right foot injury -Due to mechanical fall/syncopal event -Right foot x-ray showed mildly displaced fracture of the base of the second metatarsal with mild displacement of the metatarsal raising concern for Lisfranc injury -Orthopedics was consulted, short leg splint placed, will remain in the splint until outpatient follow-up with Dr. Radene Journey.  Patient recommended to keep splint clean and dry.  NWB RLE. - will restart Eliquis after the IR procedure -Start PT -Patient, he will not be able to manage ADLs independently, will likely need skilled nursing facility, lives alone in an apartment  Hypertension -Continue lisinopril, propranolol  Diabetes mellitus type II, NIDDM, uncontrolled with diabetic polyneuropathy -Hemoglobin A1c 7.3, placed on  sliding scale insulin Hold Glucophage and Glucotrol  History of prior DVT Will restart Eliquis after the IR procedure  History of depression -Continue Abilify, Cogentin, Wellbutrin, BuSpar   Code Status: Full code DVT Prophylaxis: Eliquis currently on hold Family Communication: Discussed all imaging results, lab results, explained to the patient     Disposition Plan: Likely will need skilled nursing facility, patient lives alone, states that he will not be able to manage ADLs independently.  Time Spent in minutes 45 minutes  Procedures:  CT angiogram of the chest  Consultants:   Intervention radiology  Antimicrobials:   Anti-infectives (From admission, onward)   None           Medications  Scheduled Meds:  [START ON 12/13/2018] apixaban  5 mg Oral BID   ARIPiprazole  20 mg Oral Daily   aspirin EC  81 mg Oral Daily   benztropine  1 mg Oral Daily   buPROPion  150 mg Oral Daily   busPIRone  15 mg Oral TID   gabapentin  300 mg Oral TID   insulin aspart  0-15 Units Subcutaneous TID WC   insulin aspart  0-5 Units Subcutaneous QHS   lisinopril  5 mg Oral Daily   multivitamin with minerals  1 tablet Oral Daily   nicotine  21 mg Transdermal Daily   pantoprazole  40 mg Oral Daily   propranolol  10 mg Oral Daily   sodium chloride flush  3 mL Intravenous Q12H   traZODone  200 mg Oral QHS   Continuous Infusions:  lactated ringers 75 mL/hr at 12/11/18 0020   PRN Meds:.acetaminophen **OR** acetaminophen, ondansetron **OR** ondansetron (ZOFRAN) IV      Subjective:   Randall Coleman was seen and examined today.  Pain 7/10 in right lower extremity in splint.  No fevers or chills.  No shortness of breath. Patient denies dizziness, chest pain,  abdominal pain, N/V/D/C, new weakness, numbess, tingling.   Objective:   Vitals:   12/11/18 0429 12/11/18 0500 12/11/18 0800 12/11/18 1115  BP: 129/78  (!) 159/84 (!) 155/82  Pulse: 72  77   Resp: 20  20   Temp: 98.1 F (36.7 C)  98.2 F (36.8 C)   TempSrc: Oral  Oral   SpO2: 96%  97%   Weight:  109.8 kg    Height:        Intake/Output Summary (Last 24 hours) at 12/11/2018 1218 Last data filed at 12/11/2018 0600 Gross per 24 hour  Intake 1241.27 ml  Output 500 ml  Net 741.27 ml     Wt Readings from Last 3 Encounters:  12/11/18 109.8 kg     Exam  General: Alert and oriented x 3, NAD  Eyes:   HEENT:  Atraumatic, normocephalic  Cardiovascular: S1 S2 auscultated, no murmurs, RRR  Respiratory: Clear to auscultation bilaterally, no wheezing, rales or rhonchi  Gastrointestinal: Soft, nontender, nondistended, + bowel sounds  Ext: Right lower extremity in splint  Neuro:  No new deficits  Musculoskeletal: No digital cyanosis, clubbing  Skin: No rashes  Psych: Normal affect and demeanor, alert and oriented x3    Data Reviewed:  I have personally reviewed following labs and imaging studies  Micro Results Recent Results (from the past 240 hour(s))  SARS CORONAVIRUS 2 (TAT 6-24 HRS) Nasopharyngeal Nasopharyngeal Swab     Status: None   Collection Time: 12/10/18  1:40 AM   Specimen: Nasopharyngeal Swab  Result Value Ref Range Status   SARS Coronavirus 2 NEGATIVE NEGATIVE  Final    Comment: (NOTE) SARS-CoV-2 target nucleic acids are NOT DETECTED. The SARS-CoV-2 RNA is generally detectable in upper and lower respiratory specimens during the acute phase of infection. Negative results do not preclude SARS-CoV-2 infection, do not rule out co-infections with other pathogens, and should not be used as the sole basis for treatment or other patient management decisions. Negative results must be combined with clinical observations, patient history, and epidemiological information. The expected result is Negative. Fact Sheet for Patients: SugarRoll.be Fact Sheet for Healthcare Providers: https://www.woods-mathews.com/ This test is not yet approved or cleared by the [place] FDA and  has been authorized for detection and/or diagnosis of SARS-CoV-2 by FDA under an Emergency Use Authorization (EUA). This EUA will remain  in effect (meaning this test can be used) for the duration of the COVID-19 declaration under Section 56 4(b)(1) of the Act, 21 U.S.C. section 360bbb-3(b)(1), unless the authorization is terminated or revoked sooner. Performed at [facility], [place] [address] [place], [place] [address]     Radiology Reports Ct Abdomen Wo Contrast  Result Date: 12/10/2018 CLINICAL DATA:  Linear wire like metallic foreign body in the LEFT main pulmonary artery. EXAM: CT ABDOMEN WITHOUT CONTRAST TECHNIQUE:  Multidetector CT imaging of the abdomen was performed following the standard protocol without IV contrast. COMPARISON:  CT chest same day FINDINGS: Lower chest:  Lung bases are clear. Hepatobiliary: No focal hepatic lesion.  Gallbladder normal Pancreas: Normal pancreatic parenchymal intensity. No ductal dilatation or inflammation. Spleen: Normal spleen. Adrenals/urinary tract: Adrenal glands and kidneys are normal. Stomach/Bowel: Stomach and limited of the small bowel is unremarkable Vascular/Lymphatic: Interval calcification of abdominal aorta. Infrarenal IVC filter in appropriate position. There is no evidence fracture of the filter. The struts appear intact. Musculoskeletal: No aggressive osseous lesion IMPRESSION: Infrarenal IVC filter in appropriate position. No hardware fracture evident. Electronically Signed   By: Suzy Bouchard M.D.   On: 12/10/2018 06:05   Dg Chest 2 View  Result Date: 12/09/2018 CLINICAL DATA:  Fall EXAM: CHEST - 2 VIEW COMPARISON:  None. FINDINGS: Basilar atelectatic changes. Question small granuloma versus vessel en face the right cardiophrenic sulcus. No consolidation, features of edema, pneumothorax, or effusion. Pulmonary vascularity is normally distributed. The cardiomediastinal contours are unremarkable. No acute osseous or soft tissue abnormality. Degenerative changes are present in the imaged spine and shoulders. 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 IMPRESSION: 1. No acute cardiopulmonary disease or traumatic finding in the chest. 2. Question small granuloma versus vessel en face in the right lung base. Electronically Signed   By: Lovena Le M.D.   On: 12/09/2018 22:34   Ct Head Wo Contrast  Result Date: 12/09/2018 CLINICAL DATA:  Found down outside of apartment EXAM: CT HEAD WITHOUT CONTRAST CT CERVICAL SPINE WITHOUT CONTRAST TECHNIQUE: Multidetector CT imaging of the head and cervical  spine was performed following the standard protocol without intravenous contrast. Multiplanar CT image reconstructions of the cervical spine were also generated. COMPARISON:  None. FINDINGS: CT HEAD FINDINGS Brain: No evidence of acute infarction, hemorrhage, hydrocephalus, extra-axial collection or mass lesion/mass effect. Symmetric prominence of the ventricles, cisterns and sulci compatible with parenchymal volume loss. Patchy areas of white matter hypoattenuation are most compatible with chronic microvascular angiopathy. Vascular: Atherosclerotic calcification of the carotid siphons and intradural vertebral arteries. No hyperdense vessel. Skull: Mild right supraorbital soft tissue swelling. No calvarial fracture or suspicious osseous lesion. Sinuses/Orbits: Leftward nasal septal deviation. Paranasal sinuses and mastoid air  cells are predominantly clear. Pneumatization of the petrous apices. Small amount of debris in the right external auditory canal. Included orbital structures are unremarkable. Other: Edentulous. CT CERVICAL SPINE FINDINGS Alignment: Cervical stabilization collar in place. There is slight exaggeration of normal cervical lordosis. No traumatic listhesis. Craniocervical and atlantoaxial articulations are normally aligned. No abnormal facet widening. Skull base and vertebrae: No acute fracture. No primary bone lesion or focal pathologic process. From Soft tissues and spinal canal: No pre or paravertebral fluid or swelling. No visible canal hematoma. Disc levels: Multilevel intervertebral disc height loss with spondylitic endplate changes and facet hypertrophic changes. Disc osteophyte complexes at C3-4, C4-5 and C6-7 result in at most mild canal stenosis. Uncinate spurring and facet hypertrophy results in mild-to-moderate multilevel foraminal narrowing throughout the cervical spine. Upper chest: No acute abnormality in the upper chest or imaged lung apices. Other: Included portions of the thyroid  are unremarkable. There is calcification in the carotid bifurcations. IMPRESSION: 1. No acute intracranial abnormality. 2. Mild right supraorbital soft tissue swelling. 3. No acute cervical spine fracture or traumatic listhesis. 4. Multilevel cervical spondylitic changes, as above. Electronically Signed   By: Lovena Le M.D.   On: 12/09/2018 22:49   Ct Angio Chest Pe W And/or Wo Contrast  Result Date: 12/10/2018 CLINICAL DATA:  Fall, found outside apartment. History of blood clots. Positive D-dimer. EXAM: CT ANGIOGRAPHY CHEST WITH CONTRAST TECHNIQUE: Multidetector CT imaging of the chest was performed using the standard protocol during bolus administration of intravenous contrast. Multiplanar CT image reconstructions and MIPs were obtained to evaluate the vascular anatomy. CONTRAST:  14mL OMNIPAQUE IOHEXOL 350 MG/ML SOLN COMPARISON:  Same day chest radiograph FINDINGS: Cardiovascular: Satisfactory opacification of the pulmonary arteries to the segmental level. No central, lobar or segmental filling defects are identified. There is a linear, metallic density along the posterior aspect of left main pulmonary artery approximately 3.6 cm in length. Atherosclerotic plaque within the normal caliber aorta. No periaortic stranding. Normal heart size. No pericardial effusion. Insert coronary Mediastinum/Nodes: No enlarged mediastinal or axillary lymph nodes. Thyroid gland, trachea, and esophagus demonstrate no significant findings. Few calcified right hilar nodes. Lungs/Pleura: Dependent atelectasis seen posteriorly. No consolidation, features of edema, pneumothorax, or effusion. Calcified granuloma seen in the right lung base corresponding to the radiographic abnormality. No suspicious pulmonary nodules or masses. Upper Abdomen: No acute abnormalities present in the visualized portions of the upper abdomen. Musculoskeletal: Tiny hypoattenuating 1 cm structure associated with the epidermal air along the right chest  wall likely reflecting an epidermal inclusion cyst. No acute osseous abnormality or suspicious osseous lesion. Multilevel degenerative changes are present in the imaged portions of the spine. 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 Remote posttraumatic deformity of the right anterior fifth rib. Review of the MIP images confirms the above findings. IMPRESSION: 1. No evidence of pulmonary embolism. No acute intrathoracic process. 2. Linear, metallic density along the posterior aspect of the left main pulmonary artery approximately 3.6 cm in length, favored to represent a metallic foreign body. Question the presence of an inferior vena cava filter on lateral chest radiograph, could reflect caval filter strut or fragmented guidewire. Could consider abdominal radiograph to confirm the presence of a caval filter and if present CT of the abdomen and pelvis would need to be obtained to assess the structural integrity as this will likely require retrieval. 3. Evidence of remote granulomatous disease in the chest. 4. Aortic Atherosclerosis (ICD10-I70.0). 5. Coronary atherosclerosis. These results were  called by telephone at the time of interpretation on 12/10/2018 at 3:26 am to provider Montine Circle , who verbally acknowledged these results. Electronically Signed   By: Lovena Le M.D.   On: 12/10/2018 03:26   Ct Cervical Spine Wo Contrast  Result Date: 12/09/2018 CLINICAL DATA:  Found down outside of apartment EXAM: CT HEAD WITHOUT CONTRAST CT CERVICAL SPINE WITHOUT CONTRAST TECHNIQUE: Multidetector CT imaging of the head and cervical spine was performed following the standard protocol without intravenous contrast. Multiplanar CT image reconstructions of the cervical spine were also generated. COMPARISON:  None. FINDINGS: CT HEAD FINDINGS Brain: No evidence of acute infarction, hemorrhage, hydrocephalus, extra-axial collection or mass  lesion/mass effect. Symmetric prominence of the ventricles, cisterns and sulci compatible with parenchymal volume loss. Patchy areas of white matter hypoattenuation are most compatible with chronic microvascular angiopathy. Vascular: Atherosclerotic calcification of the carotid siphons and intradural vertebral arteries. No hyperdense vessel. Skull: Mild right supraorbital soft tissue swelling. No calvarial fracture or suspicious osseous lesion. Sinuses/Orbits: Leftward nasal septal deviation. Paranasal sinuses and mastoid air cells are predominantly clear. Pneumatization of the petrous apices. Small amount of debris in the right external auditory canal. Included orbital structures are unremarkable. Other: Edentulous. CT CERVICAL SPINE FINDINGS Alignment: Cervical stabilization collar in place. There is slight exaggeration of normal cervical lordosis. No traumatic listhesis. Craniocervical and atlantoaxial articulations are normally aligned. No abnormal facet widening. Skull base and vertebrae: No acute fracture. No primary bone lesion or focal pathologic process. From Soft tissues and spinal canal: No pre or paravertebral fluid or swelling. No visible canal hematoma. Disc levels: Multilevel intervertebral disc height loss with spondylitic endplate changes and facet hypertrophic changes. Disc osteophyte complexes at C3-4, C4-5 and C6-7 result in at most mild canal stenosis. Uncinate spurring and facet hypertrophy results in mild-to-moderate multilevel foraminal narrowing throughout the cervical spine. Upper chest: No acute abnormality in the upper chest or imaged lung apices. Other: Included portions of the thyroid are unremarkable. There is calcification in the carotid bifurcations. IMPRESSION: 1. No acute intracranial abnormality. 2. Mild right supraorbital soft tissue swelling. 3. No acute cervical spine fracture or traumatic listhesis. 4. Multilevel cervical spondylitic changes, as above. Electronically Signed    By: Lovena Le M.D.   On: 12/09/2018 22:49   Dg Abd 2 Views  Result Date: 12/10/2018 CLINICAL DATA:  IVC filter location. EXAM: ABDOMEN - 2 VIEW COMPARISON:  None. FINDINGS: There is an IVC filter at the level of the renal vessels. There is excreted contrast within the renal pelvises and right ureter. Bowel gas pattern is normal. IMPRESSION: IVC filter at the level of the renal vessels. Electronically Signed   By: Ulyses Jarred M.D.   On: 12/10/2018 04:11   Dg Foot Complete Right  Result Date: 12/10/2018 CLINICAL DATA:  Redness, blister, and swelling on right foot, and 1-3rd MTPs. Pt denies recent injury. Hx DVT on left leg EXAM: RIGHT FOOT COMPLETE - 3+ VIEW COMPARISON:  None. FINDINGS: There is a mildly displaced fracture at the base of the second metatarsal with a small amount of lateral displacement of the metatarsal. No definite additional fracture in the adjacent metatarsals. Scattered degenerative changes. No focal bony lesion. Swelling about the anterior midfoot. IMPRESSION: Mildly displaced fracture at the base of the second metatarsal with a mild lateral displacement of the metatarsal, raising concern for Lisfranc injury. Recommend orthopedic consultation and possible CT for further evaluation. Electronically Signed   By: Audie Pinto M.D.   On: 12/10/2018 10:08  Lab Data:  CBC: Recent Labs  Lab 12/09/18 2304 12/11/18 0550  WBC 8.4 6.5  NEUTROABS 6.4  --   HGB 13.7 13.4  HCT 40.1 40.1  MCV 83.0 85.0  PLT 146* 0000000*   Basic Metabolic Panel: Recent Labs  Lab 12/09/18 2304 12/11/18 0550  NA 135 136  K 4.0 4.0  CL 101 102  CO2 23 22  GLUCOSE 213* 266*  BUN 14 13  CREATININE 0.89 0.70  CALCIUM 8.9 8.6*   GFR: Estimated Creatinine Clearance: 118.1 mL/min (by C-G formula based on SCr of 0.7 mg/dL). Liver Function Tests: Recent Labs  Lab 12/09/18 2304  AST 28  ALT 28  ALKPHOS 41  BILITOT 0.8  PROT 6.4*  ALBUMIN 4.2   No results for input(s): LIPASE,  AMYLASE in the last 168 hours. No results for input(s): AMMONIA in the last 168 hours. Coagulation Profile: Recent Labs  Lab 12/09/18 2304  INR 1.1   Cardiac Enzymes: No results for input(s): CKTOTAL, CKMB, CKMBINDEX, TROPONINI in the last 168 hours. BNP (last 3 results) No results for input(s): PROBNP in the last 8760 hours. HbA1C: Recent Labs    12/10/18 1023  HGBA1C 7.3*   CBG: Recent Labs  Lab 12/10/18 1223 12/10/18 1626 12/10/18 2146 12/11/18 0733 12/11/18 1110  GLUCAP 247* 226* 213* 275* 197*   Lipid Profile: No results for input(s): CHOL, HDL, LDLCALC, TRIG, CHOLHDL, LDLDIRECT in the last 72 hours. Thyroid Function Tests: Recent Labs    12/10/18 1023  TSH 1.061   Anemia Panel: No results for input(s): VITAMINB12, FOLATE, FERRITIN, TIBC, IRON, RETICCTPCT in the last 72 hours. Urine analysis:    Component Value Date/Time   COLORURINE YELLOW 12/10/2018 0139   APPEARANCEUR CLEAR 12/10/2018 0139   LABSPEC 1.012 12/10/2018 0139   PHURINE 6.0 12/10/2018 0139   GLUCOSEU >=500 (A) 12/10/2018 0139   HGBUR NEGATIVE 12/10/2018 0139   BILIRUBINUR NEGATIVE 12/10/2018 0139   KETONESUR 5 (A) 12/10/2018 0139   PROTEINUR NEGATIVE 12/10/2018 0139   NITRITE NEGATIVE 12/10/2018 0139   LEUKOCYTESUR NEGATIVE 12/10/2018 0139     Zarinah Oviatt M.D. [facility] 12/11/2018, 12:18 PM  Pager: [phone] Between 7am to 7pm - call Pager - [phone]  After 7pm go to [email or web address] - password [email or web address]  Call night coverage person covering after 7pm

## 2018-12-11 NOTE — Evaluation (Signed)
Physical Therapy Evaluation Patient Details Name: Randall Coleman MRN: [id number] DOB: 02/28/1951 Today's Date: 12/11/2018   History of Present Illness  68 yo male admitted with syncope, collapse, fall. Imaging showed foreign body in pulmonary artery-IR following. Imaging (+) R 2nd met fx with displacement (? Lisfranc injury). Orthopedics consulted-non op management of fx. Hx of DM, DVT  Clinical Impression  On eval, pt required Min assist to mobilize. He is not currently able to maintain NWB status when pivoting. Max Cues for safety, technique, adherence to precautions. Pt lives alone with 6 steps to enter his apt. He is not able to safely manage at home alone at this time. Will continue to follow and progress activity.     Follow Up Recommendations SNF    Equipment Recommendations  Rolling walker with 5" wheels;3in1;Wheelchair (continuing to assess)    Recommendations for Other Services OT consult     Precautions / Restrictions Precautions Precautions: Fall Required Braces or Orthoses: Splint/Cast Splint/Cast: R lower leg/foot Restrictions Weight Bearing Restrictions: Yes RLE Weight Bearing: Non weight bearing      Mobility  Bed Mobility Overal bed mobility: Needs Assistance Bed Mobility: Supine to Sit     Supine to sit: Min guard;HOB elevated     General bed mobility comments: close guard for safety.  Transfers Overall transfer level: Needs assistance Equipment used: Rolling walker (2 wheeled) Transfers: Sit to/from Stand Sit to Stand: Min assist;From elevated surface         General transfer comment: Assist to rise, stabilize, control descent. VCs safety, hand/LE placement, adherence to NWB status. Pt has difficulty maintaining NWB. Stand pivot, bed to recliner, using RW.  Ambulation/Gait             General Gait Details: NT-pt not safely able  Stairs            Wheelchair Mobility    Modified Rankin (Stroke Patients Only)       Balance  Overall balance assessment: Needs assistance         Standing balance support: Bilateral upper extremity supported Standing balance-Leahy Scale: Poor                               Pertinent Vitals/Pain Pain Assessment: 0-10 Pain Score: 6  Pain Location: R foot Pain Descriptors / Indicators: Discomfort Pain Intervention(s): Limited activity within patient's tolerance;Monitored during session;Repositioned    Home Living Family/patient expects to be discharged to:: Private residence Living Arrangements: Spouse/significant other   Type of Home: Apartment Home Access: Stairs to enter Entrance Stairs-Rails: Right Entrance Stairs-Number of Steps: 5-6 Home Layout: One level Home Equipment: Cane - single point      Prior Function Level of Independence: Independent               Hand Dominance        Extremity/Trunk Assessment   Upper Extremity Assessment Upper Extremity Assessment: Defer to OT evaluation    Lower Extremity Assessment Lower Extremity Assessment: Generalized weakness;RLE deficits/detail RLE Deficits / Details: splint in place RLE: Unable to fully assess due to immobilization    Cervical / Trunk Assessment Cervical / Trunk Assessment: Normal  Communication   Communication: No difficulties  Cognition Arousal/Alertness: Awake/alert Behavior During Therapy: WFL for tasks assessed/performed Overall Cognitive Status: Within Functional Limits for tasks assessed                                          General Comments      Exercises     Assessment/Plan    PT Assessment Patient needs continued PT services  PT Problem List Decreased strength;Decreased mobility;Decreased activity tolerance;Decreased balance;Decreased knowledge of use of DME;Pain;Decreased knowledge of precautions       PT Treatment Interventions DME instruction;Gait training;Therapeutic activities;Therapeutic exercise;Patient/family  education;Functional mobility training;Balance training    PT Goals (Current goals can be found in the Care Plan section)  Acute Rehab PT Goals Patient Stated Goal: home PT Goal Formulation: With patient Time For Goal Achievement: 12/25/18 Potential to Achieve Goals: Fair    Frequency Min 3X/week   Barriers to discharge        Co-evaluation               AM-PAC PT "6 Clicks" Mobility  Outcome Measure Help needed turning from your back to your side while in a flat bed without using bedrails?: A Little Help needed moving from lying on your back to sitting on the side of a flat bed without using bedrails?: A Little Help needed moving to and from a bed to a chair (including a wheelchair)?: A Little Help needed standing up from a chair using your arms (e.g., wheelchair or bedside chair)?: A Little Help needed to walk in hospital room?: Total Help needed climbing 3-5 steps with a railing? : Total 6 Click Score: 14    End of Session Equipment Utilized During Treatment: Gait belt Activity Tolerance: Patient tolerated treatment well Patient left: in chair;with call bell/phone within reach;with chair alarm set   PT Visit Diagnosis: Pain;Muscle weakness (generalized) (M62.81);Difficulty in walking, not elsewhere classified (R26.2);History of falling (Z91.81) Pain - Right/Left: Right Pain - part of body: Ankle and joints of foot    Time: 5885-0277 PT Time Calculation (min) (ACUTE ONLY): 18 min   Charges:   PT Evaluation $PT Eval Moderate Complexity: [place], PT Acute Rehabilitation Services Pager: [phone] Office: [phone]

## 2018-12-12 DIAGNOSIS — T17808A Unspecified foreign body in other parts of respiratory tract causing other injury, initial encounter: Secondary | ICD-10-CM

## 2018-12-12 LAB — BASIC METABOLIC PANEL
Anion gap: 10 (ref 5–15)
BUN: 13 mg/dL (ref 8–23)
CO2: 22 mmol/L (ref 22–32)
Calcium: 8.5 mg/dL — ABNORMAL LOW (ref 8.9–10.3)
Chloride: 103 mmol/L (ref 98–111)
Creatinine, Ser: 0.75 mg/dL (ref 0.61–1.24)
GFR calc Af Amer: >60 mL/min (ref >60)
GFR calc non Af Amer: >60 mL/min (ref >60)
Glucose, Bld: 259 mg/dL — ABNORMAL HIGH (ref 70–99)
Potassium: 4.3 mmol/L (ref 3.5–5.1)
Sodium: 135 mmol/L (ref 135–145)

## 2018-12-12 LAB — CBC
HCT: 41.2 % (ref 39.0–52.0)
Hemoglobin: 13.7 g/dL (ref 13.0–17.0)
MCH: 28.5 pg (ref 26.0–34.0)
MCHC: 33.3 g/dL (ref 30.0–36.0)
MCV: 85.7 fL (ref 80.0–100.0)
Platelets: 145 10*3/uL — ABNORMAL LOW (ref 150–400)
RBC: 4.81 MIL/uL (ref 4.22–5.81)
RDW: 12.7 % (ref 11.5–15.5)
WBC: 6.2 10*3/uL (ref 4.0–10.5)
nRBC: 0 % (ref 0.0–0.2)

## 2018-12-12 LAB — GLUCOSE, CAPILLARY
Glucose-Capillary: 257 mg/dL — ABNORMAL HIGH (ref 70–99)
Glucose-Capillary: 211 mg/dL — ABNORMAL HIGH (ref 70–99)
Glucose-Capillary: 174 mg/dL — ABNORMAL HIGH (ref 70–99)
Glucose-Capillary: 193 mg/dL — ABNORMAL HIGH (ref 70–99)

## 2018-12-12 MED ORDER — INSULIN GLARGINE 100 UNIT/ML ~~LOC~~ SOLN
10.0000 [IU] | Freq: Every day | SUBCUTANEOUS | Status: DC
  Administered 2018-12-12 – 2018-12-14 (×3): 10 [IU] via SUBCUTANEOUS
  Filled 2018-12-12 (×4): qty 0.1

## 2018-12-12 NOTE — Plan of Care (Signed)

## 2018-12-12 NOTE — Progress Notes (Signed)
[facility]                                                                              Patient Demographics  Randall Coleman, is a 68 y.o. male, DOB - [birth year], [id number]  Admit date - 12/09/2018   Admitting Physician Karmen Bongo, MD  Outpatient Primary MD for the patient is System, Pcp Not In  Outpatient specialists:   LOS - 1  days   Medical records reviewed and are as summarized below:    Chief Complaint  Patient presents with   Fall       Brief summary   Patient is a 68 year old male with a history of hypertension, diabetes, DVT, lives alone in an apartment, presented with fall and syncopal episode.  Patient reported that he had a fall and syncopal episode.  Patient reported that he had increasing weakness over the past few days, felt little dizzy and fell, denied striking his head.  No chest pain or shortness of breath.  Prior history of DVT, now on Eliquis. Patient's property manager reported that he was walking through the apartment after getting the groceries and fell twice possibly legs gave out.  Patient was lying on the concrete next to the car and was disoriented.  Patient's with head was bleeding and elbows looked bruised. In ED, CT head negative, troponin high-sensitivity mildly elevated.  EKG negative.  D-dimer elevated and CT angiogram was done which was negative for PE, showed metallic foreign body in the right pulmonary artery.  IR was consulted.  Plan for retrieval of IVC fragment on 10/14, n.p.o. after midnight -Restart Eliquis after the procedure Needs skilled nursing facility, social work consulted  Assessment & Plan    Principal Problem:   Syncope and collapse -Poor historian however has been feeling somewhat weak in the last few days.  EKG with no acute ST-T wave changes suggestive of ischemia -TSH 1.06 -Mildly elevated high-sensitivity troponin, 2D echo showed EF of 60 to 65%, severely increased LVH.  Dilated ascending  aorta 4 cm. -CT angiogram showed no PE, patient appears back to his baseline  Active Problems:  Mildly dilated ascending aorta, 4 cm -Incidental finding on CT angiogram of the chest will need outpatient follow-up with PCP and referral to vascular surgery for close monitoring  Foreign body in the pulmonary artery -CT angiogram chest showed linear metallic density in the left main pulmonary artery.  IR was consulted, case reviewed by Dr. Pascal Lux, patient's IVC filter is nonretrievable version, has been in place for at least 2 years.  -  Plan for possible retrieval of the PA fragment on 10/14, hold Eliquis until after the procedure.  Right foot injury -Due to mechanical fall/syncopal event -Right foot x-ray showed mildly displaced fracture of the base of the second metatarsal with mild displacement of the metatarsal raising concern for Lisfranc injury -Orthopedics was consulted, short leg splint placed, will remain in the splint until outpatient follow-up with Dr. Radene Journey.  Patient recommended to keep splint clean and dry.  NWB RLE. -Restart Eliquis after the IR procedure -Patient, he will not be able to manage ADLs independently, lives alone -PT  evaluation done, recommended skilled nursing facility, social work consulted   Hypertension -BP stable, continue lisinopril, propranolol  Diabetes mellitus type II, NIDDM, uncontrolled with diabetic polyneuropathy -Hemoglobin A1c 7.3, placed on sliding scale insulin -CBGs elevated, added Lantus 10 units subcu daily, continue sliding scale insulin  History of prior DVT Restart Eliquis after the IR procedure  History of depression -Continue Abilify, Cogentin, Wellbutrin, BuSpar   Code Status: Full code DVT Prophylaxis: Eliquis currently on hold Family Communication: Discussed all imaging results, lab results, explained to the patient     Disposition Plan:  will need skilled nursing facility, patient lives alone, states that he will not  be able to manage ADLs independently.  Time Spent in minutes 35 minutes  Procedures:  CT angiogram of the chest  Consultants:   Intervention radiology  Antimicrobials:   Anti-infectives (From admission, onward)   None         Medications  Scheduled Meds:  ARIPiprazole  20 mg Oral Daily   aspirin EC  81 mg Oral Daily   benztropine  1 mg Oral Daily   buPROPion  150 mg Oral Daily   busPIRone  15 mg Oral TID   gabapentin  300 mg Oral TID   insulin aspart  0-15 Units Subcutaneous TID WC   insulin aspart  0-5 Units Subcutaneous QHS   insulin glargine  10 Units Subcutaneous QHS   lisinopril  5 mg Oral Daily   multivitamin with minerals  1 tablet Oral Daily   nicotine  21 mg Transdermal Daily   pantoprazole  40 mg Oral Daily   propranolol  10 mg Oral Daily   sodium chloride flush  3 mL Intravenous Q12H   traZODone  200 mg Oral QHS   Continuous Infusions:  lactated ringers 10 mL/hr at 12/11/18 1725   PRN Meds:.acetaminophen **OR** acetaminophen, ondansetron **OR** ondansetron (ZOFRAN) IV      Subjective:   Randall Coleman was seen and examined today.  Feels a lot better today, sitting up in the chair, pain in the right lower extremity better controlled, 4/10, in splint.  No fevers or chills, shortness of breath.  No dizziness or lightheadedness.  Patient denies  chest pain,  abdominal pain, N/V/D/C, new weakness, numbess, tingling.   Objective:   Vitals:   12/12/18 1015 12/12/18 1017 12/12/18 1020 12/12/18 1455  BP: (!) 150/96 (!) 143/82 (!) 142/86 (!) 147/87  Pulse: 74 79 79 73  Resp:    20  Temp:    98.2 F (36.8 C)  TempSrc:    Oral  SpO2: 97% 98% 98% 95%  Weight:      Height:        Intake/Output Summary (Last 24 hours) at 12/12/2018 1632 Last data filed at 12/12/2018 1400 Gross per 24 hour  Intake 1015.88 ml  Output 850 ml  Net 165.88 ml     Wt Readings from Last 3 Encounters:  12/12/18 111.1 kg    Physical Exam  General:  Alert and oriented x 3, NAD  Eyes:   HEENT:    Cardiovascular: S1 S2 clear, RRR. No pedal edema b/l  Respiratory: CTAB, no wheezing, rales or rhonchi  Gastrointestinal: Soft, nontender, nondistended, NBS  Ext: RLE in splint  Neuro: no new deficits  Musculoskeletal: No cyanosis, clubbing  Skin: No rashes  Psych: Normal affect and demeanor, alert and oriented x3    Data Reviewed:  I have personally reviewed following labs and imaging studies  Micro Results Recent Results (from the past  240 hour(s))  SARS CORONAVIRUS 2 (TAT 6-24 HRS) Nasopharyngeal Nasopharyngeal Swab     Status: None   Collection Time: 12/10/18  1:40 AM   Specimen: Nasopharyngeal Swab  Result Value Ref Range Status   SARS Coronavirus 2 NEGATIVE NEGATIVE Final    Comment: (NOTE) SARS-CoV-2 target nucleic acids are NOT DETECTED. The SARS-CoV-2 RNA is generally detectable in upper and lower respiratory specimens during the acute phase of infection. Negative results do not preclude SARS-CoV-2 infection, do not rule out co-infections with other pathogens, and should not be used as the sole basis for treatment or other patient management decisions. Negative results must be combined with clinical observations, patient history, and epidemiological information. The expected result is Negative. Fact Sheet for Patients: SugarRoll.be Fact Sheet for Healthcare Providers: https://www.woods-mathews.com/ This test is not yet approved or cleared by the [place] FDA and  has been authorized for detection and/or diagnosis of SARS-CoV-2 by FDA under an Emergency Use Authorization (EUA). This EUA will remain  in effect (meaning this test can be used) for the duration of the COVID-19 declaration under Section 56 4(b)(1) of the Act, 21 U.S.C. section 360bbb-3(b)(1), unless the authorization is terminated or revoked sooner. Performed at [facility], [place] [address],  [place], [place] [address]     Radiology Reports Ct Abdomen Wo Contrast  Result Date: 12/10/2018 CLINICAL DATA:  Linear wire like metallic foreign body in the LEFT main pulmonary artery. EXAM: CT ABDOMEN WITHOUT CONTRAST TECHNIQUE: Multidetector CT imaging of the abdomen was performed following the standard protocol without IV contrast. COMPARISON:  CT chest same day FINDINGS: Lower chest:  Lung bases are clear. Hepatobiliary: No focal hepatic lesion.  Gallbladder normal Pancreas: Normal pancreatic parenchymal intensity. No ductal dilatation or inflammation. Spleen: Normal spleen. Adrenals/urinary tract: Adrenal glands and kidneys are normal. Stomach/Bowel: Stomach and limited of the small bowel is unremarkable Vascular/Lymphatic: Interval calcification of abdominal aorta. Infrarenal IVC filter in appropriate position. There is no evidence fracture of the filter. The struts appear intact. Musculoskeletal: No aggressive osseous lesion IMPRESSION: Infrarenal IVC filter in appropriate position. No hardware fracture evident. Electronically Signed   By: Suzy Bouchard M.D.   On: 12/10/2018 06:05   Dg Chest 2 View  Result Date: 12/09/2018 CLINICAL DATA:  Fall EXAM: CHEST - 2 VIEW COMPARISON:  None. FINDINGS: Basilar atelectatic changes. Question small granuloma versus vessel en face the right cardiophrenic sulcus. No consolidation, features of edema, pneumothorax, or effusion. Pulmonary vascularity is normally distributed. The cardiomediastinal contours are unremarkable. No acute osseous or soft tissue abnormality. Degenerative changes are present in the imaged spine and shoulders. 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 IMPRESSION: 1. No acute cardiopulmonary disease or traumatic finding in the chest. 2. Question small granuloma versus vessel en face in the right lung base. Electronically Signed   By: Lovena Le M.D.   On: 12/09/2018  22:34   Ct Head Wo Contrast  Result Date: 12/09/2018 CLINICAL DATA:  Found down outside of apartment EXAM: CT HEAD WITHOUT CONTRAST CT CERVICAL SPINE WITHOUT CONTRAST TECHNIQUE: Multidetector CT imaging of the head and cervical spine was performed following the standard protocol without intravenous contrast. Multiplanar CT image reconstructions of the cervical spine were also generated. COMPARISON:  None. FINDINGS: CT HEAD FINDINGS Brain: No evidence of acute infarction, hemorrhage, hydrocephalus, extra-axial collection or mass lesion/mass effect. Symmetric prominence of the ventricles, cisterns and sulci compatible with parenchymal volume loss. Patchy areas of white matter hypoattenuation are  most compatible with chronic microvascular angiopathy. Vascular: Atherosclerotic calcification of the carotid siphons and intradural vertebral arteries. No hyperdense vessel. Skull: Mild right supraorbital soft tissue swelling. No calvarial fracture or suspicious osseous lesion. Sinuses/Orbits: Leftward nasal septal deviation. Paranasal sinuses and mastoid air cells are predominantly clear. Pneumatization of the petrous apices. Small amount of debris in the right external auditory canal. Included orbital structures are unremarkable. Other: Edentulous. CT CERVICAL SPINE FINDINGS Alignment: Cervical stabilization collar in place. There is slight exaggeration of normal cervical lordosis. No traumatic listhesis. Craniocervical and atlantoaxial articulations are normally aligned. No abnormal facet widening. Skull base and vertebrae: No acute fracture. No primary bone lesion or focal pathologic process. From Soft tissues and spinal canal: No pre or paravertebral fluid or swelling. No visible canal hematoma. Disc levels: Multilevel intervertebral disc height loss with spondylitic endplate changes and facet hypertrophic changes. Disc osteophyte complexes at C3-4, C4-5 and C6-7 result in at most mild canal stenosis. Uncinate  spurring and facet hypertrophy results in mild-to-moderate multilevel foraminal narrowing throughout the cervical spine. Upper chest: No acute abnormality in the upper chest or imaged lung apices. Other: Included portions of the thyroid are unremarkable. There is calcification in the carotid bifurcations. IMPRESSION: 1. No acute intracranial abnormality. 2. Mild right supraorbital soft tissue swelling. 3. No acute cervical spine fracture or traumatic listhesis. 4. Multilevel cervical spondylitic changes, as above. Electronically Signed   By: Lovena Le M.D.   On: 12/09/2018 22:49   Ct Angio Chest Pe W And/or Wo Contrast  Result Date: 12/10/2018 CLINICAL DATA:  Fall, found outside apartment. History of blood clots. Positive D-dimer. EXAM: CT ANGIOGRAPHY CHEST WITH CONTRAST TECHNIQUE: Multidetector CT imaging of the chest was performed using the standard protocol during bolus administration of intravenous contrast. Multiplanar CT image reconstructions and MIPs were obtained to evaluate the vascular anatomy. CONTRAST:  156mL OMNIPAQUE IOHEXOL 350 MG/ML SOLN COMPARISON:  Same day chest radiograph FINDINGS: Cardiovascular: Satisfactory opacification of the pulmonary arteries to the segmental level. No central, lobar or segmental filling defects are identified. There is a linear, metallic density along the posterior aspect of left main pulmonary artery approximately 3.6 cm in length. Atherosclerotic plaque within the normal caliber aorta. No periaortic stranding. Normal heart size. No pericardial effusion. Insert coronary Mediastinum/Nodes: No enlarged mediastinal or axillary lymph nodes. Thyroid gland, trachea, and esophagus demonstrate no significant findings. Few calcified right hilar nodes. Lungs/Pleura: Dependent atelectasis seen posteriorly. No consolidation, features of edema, pneumothorax, or effusion. Calcified granuloma seen in the right lung base corresponding to the radiographic abnormality. No  suspicious pulmonary nodules or masses. Upper Abdomen: No acute abnormalities present in the visualized portions of the upper abdomen. Musculoskeletal: Tiny hypoattenuating 1 cm structure associated with the epidermal air along the right chest wall likely reflecting an epidermal inclusion cyst. No acute osseous abnormality or suspicious osseous lesion. Multilevel degenerative changes are present in the imaged portions of the spine. 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 Remote posttraumatic deformity of the right anterior fifth rib. Review of the MIP images confirms the above findings. IMPRESSION: 1. No evidence of pulmonary embolism. No acute intrathoracic process. 2. Linear, metallic density along the posterior aspect of the left main pulmonary artery approximately 3.6 cm in length, favored to represent a metallic foreign body. Question the presence of an inferior vena cava filter on lateral chest radiograph, could reflect caval filter strut or fragmented guidewire. Could consider abdominal radiograph to confirm the presence of a caval filter  and if present CT of the abdomen and pelvis would need to be obtained to assess the structural integrity as this will likely require retrieval. 3. Evidence of remote granulomatous disease in the chest. 4. Aortic Atherosclerosis (ICD10-I70.0). 5. Coronary atherosclerosis. These results were called by telephone at the time of interpretation on 12/10/2018 at 3:26 am to provider Montine Circle , who verbally acknowledged these results. Electronically Signed   By: Lovena Le M.D.   On: 12/10/2018 03:26   Ct Cervical Spine Wo Contrast  Result Date: 12/09/2018 CLINICAL DATA:  Found down outside of apartment EXAM: CT HEAD WITHOUT CONTRAST CT CERVICAL SPINE WITHOUT CONTRAST TECHNIQUE: Multidetector CT imaging of the head and cervical spine was performed following the standard protocol without  intravenous contrast. Multiplanar CT image reconstructions of the cervical spine were also generated. COMPARISON:  None. FINDINGS: CT HEAD FINDINGS Brain: No evidence of acute infarction, hemorrhage, hydrocephalus, extra-axial collection or mass lesion/mass effect. Symmetric prominence of the ventricles, cisterns and sulci compatible with parenchymal volume loss. Patchy areas of white matter hypoattenuation are most compatible with chronic microvascular angiopathy. Vascular: Atherosclerotic calcification of the carotid siphons and intradural vertebral arteries. No hyperdense vessel. Skull: Mild right supraorbital soft tissue swelling. No calvarial fracture or suspicious osseous lesion. Sinuses/Orbits: Leftward nasal septal deviation. Paranasal sinuses and mastoid air cells are predominantly clear. Pneumatization of the petrous apices. Small amount of debris in the right external auditory canal. Included orbital structures are unremarkable. Other: Edentulous. CT CERVICAL SPINE FINDINGS Alignment: Cervical stabilization collar in place. There is slight exaggeration of normal cervical lordosis. No traumatic listhesis. Craniocervical and atlantoaxial articulations are normally aligned. No abnormal facet widening. Skull base and vertebrae: No acute fracture. No primary bone lesion or focal pathologic process. From Soft tissues and spinal canal: No pre or paravertebral fluid or swelling. No visible canal hematoma. Disc levels: Multilevel intervertebral disc height loss with spondylitic endplate changes and facet hypertrophic changes. Disc osteophyte complexes at C3-4, C4-5 and C6-7 result in at most mild canal stenosis. Uncinate spurring and facet hypertrophy results in mild-to-moderate multilevel foraminal narrowing throughout the cervical spine. Upper chest: No acute abnormality in the upper chest or imaged lung apices. Other: Included portions of the thyroid are unremarkable. There is calcification in the carotid  bifurcations. IMPRESSION: 1. No acute intracranial abnormality. 2. Mild right supraorbital soft tissue swelling. 3. No acute cervical spine fracture or traumatic listhesis. 4. Multilevel cervical spondylitic changes, as above. Electronically Signed   By: Lovena Le M.D.   On: 12/09/2018 22:49   Dg Abd 2 Views  Result Date: 12/10/2018 CLINICAL DATA:  IVC filter location. EXAM: ABDOMEN - 2 VIEW COMPARISON:  None. FINDINGS: There is an IVC filter at the level of the renal vessels. There is excreted contrast within the renal pelvises and right ureter. Bowel gas pattern is normal. IMPRESSION: IVC filter at the level of the renal vessels. Electronically Signed   By: Ulyses Jarred M.D.   On: 12/10/2018 04:11   Dg Foot Complete Right  Result Date: 12/10/2018 CLINICAL DATA:  Redness, blister, and swelling on right foot, and 1-3rd MTPs. Pt denies recent injury. Hx DVT on left leg EXAM: RIGHT FOOT COMPLETE - 3+ VIEW COMPARISON:  None. FINDINGS: There is a mildly displaced fracture at the base of the second metatarsal with a small amount of lateral displacement of the metatarsal. No definite additional fracture in the adjacent metatarsals. Scattered degenerative changes. No focal bony lesion. Swelling about the anterior midfoot. IMPRESSION: Mildly displaced fracture  at the base of the second metatarsal with a mild lateral displacement of the metatarsal, raising concern for Lisfranc injury. Recommend orthopedic consultation and possible CT for further evaluation. Electronically Signed   By: Audie Pinto M.D.   On: 12/10/2018 10:08    Lab Data:  CBC: Recent Labs  Lab 12/09/18 2304 12/11/18 0550 12/12/18 0507  WBC 8.4 6.5 6.2  NEUTROABS 6.4  --   --   HGB 13.7 13.4 13.7  HCT 40.1 40.1 41.2  MCV 83.0 85.0 85.7  PLT 146* 128* [id number]*   Basic Metabolic Panel: Recent Labs  Lab 12/09/18 2304 12/11/18 0550 12/12/18 0507  NA 135 136 135  K 4.0 4.0 4.3  CL 101 102 103  CO2 23 22 22   GLUCOSE 213*  266* 259*  BUN 14 13 13   CREATININE 0.89 0.70 0.75  CALCIUM 8.9 8.6* 8.5*   GFR: Estimated Creatinine Clearance: 118.9 mL/min (by C-G formula based on SCr of 0.75 mg/dL). Liver Function Tests: Recent Labs  Lab 12/09/18 2304  AST 28  ALT 28  ALKPHOS 41  BILITOT 0.8  PROT 6.4*  ALBUMIN 4.2   No results for input(s): LIPASE, AMYLASE in the last 168 hours. No results for input(s): AMMONIA in the last 168 hours. Coagulation Profile: Recent Labs  Lab 12/09/18 2304  INR 1.1   Cardiac Enzymes: No results for input(s): CKTOTAL, CKMB, CKMBINDEX, TROPONINI in the last 168 hours. BNP (last 3 results) No results for input(s): PROBNP in the last 8760 hours. HbA1C: Recent Labs    12/10/18 1023  HGBA1C 7.3*   CBG: Recent Labs  Lab 12/11/18 1625 12/11/18 2259 12/12/18 0759 12/12/18 1153 12/12/18 1556  GLUCAP 247* 229* 257* 211* 174*   Lipid Profile: No results for input(s): CHOL, HDL, LDLCALC, TRIG, CHOLHDL, LDLDIRECT in the last 72 hours. Thyroid Function Tests: Recent Labs    12/10/18 1023  TSH 1.061   Anemia Panel: No results for input(s): VITAMINB12, FOLATE, FERRITIN, TIBC, IRON, RETICCTPCT in the last 72 hours. Urine analysis:    Component Value Date/Time   COLORURINE YELLOW 12/10/2018 0139   APPEARANCEUR CLEAR 12/10/2018 0139   LABSPEC 1.012 12/10/2018 0139   PHURINE 6.0 12/10/2018 0139   GLUCOSEU >=500 (A) 12/10/2018 0139   HGBUR NEGATIVE 12/10/2018 0139   BILIRUBINUR NEGATIVE 12/10/2018 0139   KETONESUR 5 (A) 12/10/2018 0139   PROTEINUR NEGATIVE 12/10/2018 0139   NITRITE NEGATIVE 12/10/2018 0139   LEUKOCYTESUR NEGATIVE 12/10/2018 0139     Caci Orren M.D. [facility] 12/12/2018, 4:32 PM  Pager: [phone] Between 7am to 7pm - call Pager - [phone]  After 7pm go to [email or web address] - password [email or web address]  Call night coverage person covering after 7pm

## 2018-12-12 NOTE — Progress Notes (Addendum)
Inpatient Diabetes Program Recommendations  AACE/ADA: New Consensus Statement on Inpatient Glycemic Control (2015)  Target Ranges:  Prepandial:   less than 140 mg/dL      Peak postprandial:   less than 180 mg/dL (1-2 hours)      Critically ill patients:  140 - 180 mg/dL   Results for Randall Coleman, Randall Coleman (MRN [id number]) as of 12/12/2018 09:59  Ref. Range 12/11/2018 07:33 12/11/2018 11:10 12/11/2018 16:25 12/11/2018 22:59  Glucose-Capillary Latest Ref Range: 70 - 99 mg/dL 275 (H)  8 units NOVOLOG  197 (H)  3 units NOVOLOG  247 (H)  5 units NOVOLOG  229 (H)  2 units NOVOLOG    Results for Randall Coleman, Randall Coleman (MRN [id number]) as of 12/12/2018 09:59  Ref. Range 12/12/2018 07:59  Glucose-Capillary Latest Ref Range: 70 - 99 mg/dL 257 (H)  8 units NOVOLOG      Admit Syncope/ Fall with Foot Fracture/ Foreign body found in Pulm Artery  History: DM2   Home DM Meds: Glipizide 2.5 mg BID       Metformin 1000 mg BID   Current Orders: Novolog Moderate Correction Scale/ SSI (0-15 units) TID AC + HS    MD- Note home oral DM meds are on hold at present.  Patient having elevated CBGs.  Please consider adding Lantus 10 units QHS (0.1 units/kg) for in hospital   --Will follow patient during hospitalization--  Wyn Quaker RN, MSN, CDE Diabetes Coordinator Inpatient Glycemic Control Team Team Pager: [phone] (8a-5p)

## 2018-12-13 ENCOUNTER — Inpatient Hospital Stay (HOSPITAL_COMMUNITY): Payer: Medicare Other

## 2018-12-13 DIAGNOSIS — E119 Type 2 diabetes mellitus without complications: Secondary | ICD-10-CM

## 2018-12-13 DIAGNOSIS — S92901D Unspecified fracture of right foot, subsequent encounter for fracture with routine healing: Secondary | ICD-10-CM

## 2018-12-13 HISTORY — PX: IR CHEST FLUORO: IMG2383

## 2018-12-13 LAB — BASIC METABOLIC PANEL
Anion gap: 10 (ref 5–15)
BUN: 13 mg/dL (ref 8–23)
CO2: 23 mmol/L (ref 22–32)
Calcium: 8.7 mg/dL — ABNORMAL LOW (ref 8.9–10.3)
Chloride: 103 mmol/L (ref 98–111)
Creatinine, Ser: 0.72 mg/dL (ref 0.61–1.24)
GFR calc Af Amer: >60 mL/min (ref >60)
GFR calc non Af Amer: >60 mL/min (ref >60)
Glucose, Bld: 239 mg/dL — ABNORMAL HIGH (ref 70–99)
Potassium: 4.3 mmol/L (ref 3.5–5.1)
Sodium: 136 mmol/L (ref 135–145)

## 2018-12-13 LAB — CBC
HCT: 40.6 % (ref 39.0–52.0)
Hemoglobin: 13.6 g/dL (ref 13.0–17.0)
MCH: 28 pg (ref 26.0–34.0)
MCHC: 33.5 g/dL (ref 30.0–36.0)
MCV: 83.5 fL (ref 80.0–100.0)
Platelets: 143 10*3/uL — ABNORMAL LOW (ref 150–400)
RBC: 4.86 MIL/uL (ref 4.22–5.81)
RDW: 12.6 % (ref 11.5–15.5)
WBC: 5.4 10*3/uL (ref 4.0–10.5)
nRBC: 0 % (ref 0.0–0.2)

## 2018-12-13 LAB — GLUCOSE, CAPILLARY
Glucose-Capillary: 245 mg/dL — ABNORMAL HIGH (ref 70–99)
Glucose-Capillary: 213 mg/dL — ABNORMAL HIGH (ref 70–99)
Glucose-Capillary: 133 mg/dL — ABNORMAL HIGH (ref 70–99)
Glucose-Capillary: 313 mg/dL — ABNORMAL HIGH (ref 70–99)

## 2018-12-13 NOTE — NC FL2 (Signed)
[place] LEVEL OF CARE SCREENING TOOL     IDENTIFICATION  Patient Name: Randall Coleman Birthdate: 02/21/1951 Sex: male Admission Date (Current Location): 12/09/2018  [facility] and [place] Number:  Herbalist and Address:  [facility],  [place] [address] [place]      Provider Number: [id number]  Attending Physician Name and Address:  Louellen Molder, MD  Relative Name and Phone Number:       Current Level of Care: Hospital Recommended Level of Care: [place] Prior Approval Number:    Date Approved/Denied:   PASRR Number: [id number]  Discharge Plan: SNF    Current Diagnoses: Patient Active Problem List   Diagnosis Date Noted  . Foot fracture, right 12/11/2018  . Foreign body in lung 12/10/2018  . Syncope and collapse 12/10/2018  . Foot injury, right, initial encounter 12/10/2018  . Essential hypertension 12/10/2018  . History of DVT of lower extremity 12/10/2018  . Type 2 diabetes mellitus without complication ([place]) 123456  . Unspecified atrial fibrillation ([place]) 12/10/2018    Orientation RESPIRATION BLADDER Height & Weight     Self, Time, Situation, Place  Normal Continent Weight: 111 kg Height:  6\' 2"  (188 cm)  BEHAVIORAL SYMPTOMS/MOOD NEUROLOGICAL BOWEL NUTRITION STATUS      Continent Diet(CHO MOD)  AMBULATORY STATUS COMMUNICATION OF NEEDS Skin   Limited Assist(NWB RLE-R foot fx-cast) Verbally Normal                       Personal Care Assistance Level of Assistance  Bathing, Feeding, Dressing Bathing Assistance: Limited assistance Feeding assistance: Limited assistance Dressing Assistance: Limited assistance     Functional Limitations Info  Sight, Hearing, Speech Sight Info: Impaired(eyeglasses) Hearing Info: Adequate Speech Info: Adequate    SPECIAL CARE FACTORS FREQUENCY  PT (By licensed PT), OT (By licensed OT)     PT Frequency: 5x week OT Frequency: 5x week             Contractures Contractures Info: Not present    Additional Factors Info  Code Status, Insulin Sliding Scale Code Status Info: full code     Insulin Sliding Scale Info: SSI       Current Medications (12/13/2018):  This is the current hospital active medication list Current Facility-Administered Medications  Medication Dose Route Frequency Provider Last Rate Last Dose  . acetaminophen (TYLENOL) tablet 650 mg  650 mg Oral Q6H PRN Karmen Bongo, MD   650 mg at 12/11/18 0017   Or  . acetaminophen (TYLENOL) suppository 650 mg  650 mg Rectal Q6H PRN Karmen Bongo, MD      . ARIPiprazole (ABILIFY) tablet 20 mg  20 mg Oral Daily Karmen Bongo, MD   20 mg at 12/13/18 1141  . aspirin EC tablet 81 mg  81 mg Oral Daily Karmen Bongo, MD   81 mg at 12/13/18 1141  . benztropine (COGENTIN) tablet 1 mg  1 mg Oral Daily Karmen Bongo, MD   1 mg at 12/13/18 1143  . buPROPion (WELLBUTRIN XL) 24 hr tablet 150 mg  150 mg Oral Daily Karmen Bongo, MD   150 mg at 12/13/18 1141  . busPIRone (BUSPAR) tablet 15 mg  15 mg Oral TID Karmen Bongo, MD   15 mg at 12/13/18 1143  . gabapentin (NEURONTIN) capsule 300 mg  300 mg Oral TID Karmen Bongo, MD   300 mg at 12/13/18 1144  . insulin aspart (novoLOG) injection 0-15 Units  0-15 Units  Subcutaneous TID WC Karmen Bongo, MD   3 Units at 12/12/18 1802  . insulin aspart (novoLOG) injection 0-5 Units  0-5 Units Subcutaneous QHS Karmen Bongo, MD   2 Units at 12/11/18 2259  . insulin glargine (LANTUS) injection 10 Units  10 Units Subcutaneous QHS Rai, Ripudeep K, MD   10 Units at 12/12/18 2139  . lactated ringers infusion   Intravenous Continuous Rai, Ripudeep K, MD 10 mL/hr at 12/11/18 1725    . lisinopril (ZESTRIL) tablet 5 mg  5 mg Oral Daily Karmen Bongo, MD   5 mg at 12/13/18 1141  . multivitamin with minerals tablet 1 tablet  1 tablet Oral Daily Karmen Bongo, MD   1 tablet at 12/13/18 1142  . nicotine (NICODERM CQ - dosed in mg/24  hours) patch 21 mg  21 mg Transdermal Daily Karmen Bongo, MD   21 mg at 12/12/18 0830  . ondansetron (ZOFRAN) tablet 4 mg  4 mg Oral Q6H PRN Karmen Bongo, MD       Or  . ondansetron [facility]) injection 4 mg  4 mg Intravenous Q6H PRN Karmen Bongo, MD      . pantoprazole (PROTONIX) EC tablet 40 mg  40 mg Oral Daily Karmen Bongo, MD   40 mg at 12/13/18 1142  . propranolol (INDERAL) tablet 10 mg  10 mg Oral Daily Karmen Bongo, MD   10 mg at 12/13/18 1142  . sodium chloride flush (NS) 0.9 % injection 3 mL  3 mL Intravenous Q12H Karmen Bongo, MD   3 mL at 12/13/18 1144  . traZODone (DESYREL) tablet 200 mg  200 mg Oral QHS Karmen Bongo, MD   200 mg at 12/12/18 2139     Discharge Medications: Please see discharge summary for a list of discharge medications.  Relevant Imaging Results:  Relevant Lab Results:   Additional Information ss#255 [address] Juliann Pulse, [place]

## 2018-12-13 NOTE — TOC Progression Note (Signed)
Transition of Care [facility]) - Progression Note    Patient Details  Name: Milferd Mallard MRN: [id number] Date of Birth: Apr 16, 1950  Transition of Care [facility]) CM/SW Contact  Jonluke Cobbins, Juliann Pulse, RN Phone Number: 12/13/2018, 3:52 PM  Clinical Narrative: Provided patient w/SNF choices-await choice.           Expected Discharge Plan and Services                                                 Social Determinants of Health (SDOH) Interventions    Readmission Risk Interventions No flowsheet data found.

## 2018-12-13 NOTE — Progress Notes (Signed)
Physical Therapy Treatment Patient Details Name: Randall Coleman MRN: [id number] DOB: 11-30-1950 Today's Date: 12/13/2018    History of Present Illness 68 yo male admitted with syncope, collapse, fall. Imaging showed foreign body in pulmonary artery-IR following. Imaging (+) R 2nd met fx with displacement (? Lisfranc injury). Orthopedics consulted-non op management of fx. Hx of DM, DVT    PT Comments    Progressing with mobility. Instructed pt to maintain NWB on R LE at all times-he should NOT be putting any weight on that LE when mobilizing. Continue to recommend SNF for rehab.    Follow Up Recommendations  SNF     Equipment Recommendations  Rolling walker with 5" wheels;3in1 (PT)    Recommendations for Other Services       Precautions / Restrictions Precautions Precautions: Fall Required Braces or Orthoses: Splint/Cast Splint/Cast: R lower leg/foot Restrictions Weight Bearing Restrictions: Yes RLE Weight Bearing: Non weight bearing    Mobility  Bed Mobility Overal bed mobility: Needs Assistance Bed Mobility: Supine to Sit;Sit to Supine     Supine to sit: Supervision;HOB elevated Sit to supine: HOB elevated   General bed mobility comments: for safety, lines.  Transfers Overall transfer level: Needs assistance Equipment used: Rolling walker (2 wheeled) Transfers: Sit to/from Stand Sit to Stand: Mod assist;Min assist         General transfer comment: Practiced sit to stand transitions x 5 to work on safety, proper technique, adherence to NWB status. Mod VCS required. Pt still has difficulty consistently maintaining NWB. He progressed from Mod assist to [place] assist with practice and instruction.  Ambulation/Gait             General Gait Details: NT-pt not safely able   Stairs             Wheelchair Mobility    Modified Rankin (Stroke Patients Only)       Balance Overall balance assessment: Needs assistance;History of Falls          Standing balance support: Bilateral upper extremity supported Standing balance-Leahy Scale: Poor                              Cognition Arousal/Alertness: Awake/alert Behavior During Therapy: WFL for tasks assessed/performed Overall Cognitive Status: Within Functional Limits for tasks assessed                                        Exercises      General Comments        Pertinent Vitals/Pain Pain Assessment: Faces Faces Pain Scale: Hurts even more Pain Location: R foot Pain Descriptors / Indicators: Discomfort Pain Intervention(s): Monitored during session;Repositioned    Home Living                      Prior Function            PT Goals (current goals can now be found in the care plan section) Progress towards PT goals: Progressing toward goals    Frequency    Min 3X/week      PT Plan Current plan remains appropriate    Co-evaluation              AM-PAC PT "6 Clicks" Mobility   Outcome Measure  Help needed turning from your back to your side while in a flat bed  without using bedrails?: A Little Help needed moving from lying on your back to sitting on the side of a flat bed without using bedrails?: A Little Help needed moving to and from a bed to a chair (including a wheelchair)?: A Lot Help needed standing up from a chair using your arms (e.g., wheelchair or bedside chair)?: A Little Help needed to walk in hospital room?: Total Help needed climbing 3-5 steps with a railing? : Total 6 Click Score: 13    End of Session Equipment Utilized During Treatment: Gait belt Activity Tolerance: Patient tolerated treatment well Patient left: in bed;with call bell/phone within reach;with bed alarm set   PT Visit Diagnosis: Pain;Muscle weakness (generalized) (M62.81);Difficulty in walking, not elsewhere classified (R26.2);History of falling (Z91.81) Pain - Right/Left: Right Pain - part of body: Ankle and joints of foot      Time: 1426-1446 PT Time Calculation (min) (ACUTE ONLY): 20 min  Charges:  $Therapeutic Activity: 8-22 mins                        Weston Anna, PT Acute Rehabilitation Services Pager: [phone] Office: [phone]

## 2018-12-13 NOTE — Progress Notes (Signed)
Inpatient Diabetes Program Recommendations  AACE/ADA: New Consensus Statement on Inpatient Glycemic Control (2015)  Target Ranges:  Prepandial:   less than 140 mg/dL      Peak postprandial:   less than 180 mg/dL (1-2 hours)      Critically ill patients:  140 - 180 mg/dL   Lab Results  Component Value Date   GLUCAP 245 (H) 12/13/2018   HGBA1C 7.3 (H) 12/10/2018    Review of Glycemic Control Results for LYNWOOD, SNOUFFER (MRN [id number]) as of 12/13/2018 10:20  Ref. Range 12/12/2018 07:59 12/12/2018 11:53 12/12/2018 15:56 12/12/2018 21:03 12/13/2018 07:50  Glucose-Capillary Latest Ref Range: 70 - 99 mg/dL 257 (H) 211 (H) 174 (H) 193 (H) 245 (H)   Diabetes history: DM 2 Outpatient Diabetes medications: Glipizide 2.5 mg bis, Metformin 1000 mg bid Current orders for Inpatient glycemic control: Lantus 10 units qhs, Novolog 0-15 units tid + hs  Inpatient Diabetes Program Recommendations:    Glucose 245 this am after Lantus 10 units added last night. Consider increasing Lantus to 16-18 units.  Thanks,  Tama Headings RN, MSN, BC-ADM Inpatient Diabetes Coordinator Team Pager [phone] (8a-5p)

## 2018-12-13 NOTE — TOC Progression Note (Signed)
Transition of Care [facility]) - Progression Note    Patient Details  Name: Randall Coleman MRN: [id number] Date of Birth: 03-16-1950  [facility]) CM/SW Contact  Manus Weedman, Juliann Pulse, RN Phone Number: 12/13/2018, 3:44 PM  Clinical Narrative:  12/13/2018 Medicare Nursing Home Results [email or web address], [email or web address]&paging=131 1/6 Close window 31 hospitals within 25 miles from the center of [place], [place]. Nursing Home Search Results Results List Table Nursing Home Information Overall Rating Health Inpections Staffing Quality Ratings [facility] [place], [place] [address] [phone] Below Average Below Average Below Average Below Average [facility] [place], [place] [address] [phone] Below Average Below Average Below Average Average [place], [place] [address] [phone] Much Above Average Much Above Average Average Below Average [place], [place] [address] [phone] Below Average Below Average Below Average Average 2 out of 5 stars 2 out of 5 stars 2 out of 5 stars 2 out of 5 stars 2 out of 5 stars 2 out of 5 stars 2 out of 5 stars 3 out of 5 stars 5 out of 5 stars 5 out of 5 stars 3 out of 5 stars 2 out of 5 stars 2 out of 5 stars 2 out of 5 stars 2 out of 5 stars 3 out of 5 stars 12/13/2018 Medicare Nursing Home Results [email or web address], [email or web address]&sort=[email or web address]&paging=131 2/6 Nursing Home Information Overall Rating Health Inpections Staffing Quality Ratings [facility] [place], [place] [address] [phone] Much  Above Average Above Average Much Above Average Much Above Average [place] [facility] [place], [place] [address] [phone] Much Below Average Much Below Average Below Average Below Average [place], [place] [address] [phone] Much Below Average Much Below Average Below Average Much Below Average [place] [address] [place], [place] [address] [phone] Much Below Average Much Below Average Below Average Below Average [place], [place] [address] [phone] Below Average Below Average Below Average Above Average [facility] [place], [place] [address] [phone] Much Above Average Much Above Average Much Above Average Much Above Average 5 out of 5 stars 4 out of 5 stars 5 out of 5 stars 5 out of 5 stars 1 out of 5 stars 1 out of 5 stars 2 out of 5 stars 2 out of 5 stars 1 out of 5 stars 1 out of 5 stars 2 out of 5 stars 1 out of 5 stars 1 out of 5 stars 1 out of 5 stars 2 out of 5 stars 2 out of 5 stars 2 out of 5 stars 2 out of 5 stars 2 out of 5 stars 4 out of 5 stars 5 out of 5 stars 5 out of 5 stars 5 out of 5 stars 5 out of 5 stars 12/13/2018 Medicare Nursing Home Results [email or web address], [email or web address]&sort=[email or web address]&paging=131 3/6 Nursing Home Information Overall Rating Health Inpections Staffing Quality Ratings [facility] [address] [place], [place] [address] [phone] Below Average Below Average Below Average Average [facility] [place], [place] [address] [phone] Much Above Average Above Average Much Above Average Much Above Average [facility]  [place], [place] [address] [phone] Average Average Below Average Average [facility] [place], [place] [address] [phone] Much Below Average Much Below Average Below Average Average [place], [place] [address] [phone] Above Average Above Average Below Average Above Average [facility] [address] [place], [place] [address] [phone] Below Average Below Average Below Average Below Average 2 out of 5 stars 2 out of 5 stars 2 out of 5 stars 3 out of 5 stars 5 out of 5 stars 4 out of 5 stars 5 out of 5 stars 5 out of 5 stars 3 out of 5 stars 3 out of 5 stars 2 out of 5 stars 3 out of 5 stars 1 out of 5 stars 1 out of 5 stars 2 out of 5 stars 3 out of 5 stars 4 out of 5 stars 4 out of 5 stars 2 out of 5 stars 4 out of 5 stars 2 out of 5 stars 2 out of 5 stars 2 out of 5 stars 2 out of 5 stars 12/13/2018 Medicare Nursing Home Results [email or web address], [email or web address]&sort=[email or web address]&paging=131 4/6 [place] Inpections Staffing Quality Ratings [place], [place] [address] [phone] Much Above Average Much Above Average Below Average Much Above Average [facility] [place] [address] [place], [place] [phone] Much Above Average Much Above Average Above Average Much Above [place], [place] [address] [phone] Much Below Average Much Below Average Below Average Average [place], [place] [address] [phone] Much Below Average Much Below Average Much Below Average Average [facility] [place], [place] [address] [phone] Much Below Average Much Below Average Below Average Below Average [place] [address] [place], [place] [address] [phone] Above Average Average Below Average Much Above Average [facility] [address] [place], [place] [address] [phone] Below Average Much Below Average Above Average Average 5 out of 5 stars 5 out of 5 stars 2 out of 5 stars 5 out of 5 stars 5 out of 5 stars 5 out of 5 stars 4 out of 5 stars 5 out of 5 stars 1 out of 5 stars 1 out of 5 stars 2 out of 5 stars 3 out of 5 stars 1 out of 5 stars 1 out of 5 stars 1 out of 5 stars 3 out of 5 stars 1 out of 5 stars 1 out of 5 stars 2 out of 5 stars 2 out of 5 stars 4 out of 5 stars 3 out of 5 stars 2 out of 5 stars 5 out of 5 stars 2 out of 5 stars 1 out of 5 stars 4 out of 5 stars 3 out of 5 stars 12/13/2018 Medicare Nursing Home Results [email or web address], [email or web address]&sort=19ASC&paging=131 5/6 Nursing Home Information Overall Rating Health Inpections Staffing Quality Ratings [facility] [place], [place] [address] [phone] Much Above Average Above Average Much Above Average Much Above Average [place], [place] [address] [phone] Much Above Average Above Average Much Above Average Much Above Average THE [facility] [place] [address] [place], [place] [id number] [phone] Below Average Below Average Below Average Below Average [place], [place] [address] [phone] Much Below Average Much Below Average Average Above Average [place] [facility] [address] [place], [place] [address] [phone] Average Average Below Average  Average [facility] [place], [place] [address] [phone] Average Average Below Average Below Average 5 out of 5 stars 4 out of 5 stars 5 out of 5 stars 5 out of 5 stars 5 out of 5 stars 4 out of 5 stars 5 out of 5 stars 5 out of 5 stars 2 out of 5 stars 2 out of 5 stars 2 out of 5 stars 2 out of 5 stars 1 out of 5 stars 1 out of 5 stars 3 out of 5 stars 4 out of 5 stars 3 out of 5 stars 3 out of 5 stars 2 out of 5 stars 3 out of 5 stars 3 out of 5 stars 3 out of 5 stars 2 out of 5 stars 2 out of 5 stars 12/13/2018 Medicare Nursing Home Results [email or web address], [email or web address]&sort=19ASC&paging=131 6/6 Nursing Home Information Overall Rating Health Inpections Staffing Quality Ratings [facility] [place], [place] [address] [phone] Above Average Above Average Above Average Above [employer] [address] [place], [place] [address] [phone] Much Below Average Much Below Average Not Available12 Below Average NursingHomeCompare Footnotes Footnote number Footnote as displayed on nursing home Compare 1 Newly certified nursing home with less than 12-15 months of data available or the nursing opened less than 6 months ago, and there were no data to submit or claims for this measure. 2 Not enough data available to calculate a star rating. 6 This facility did not submit staffing data, or submitted data that did not meet the criteria required to calculate a staffing measure. 7 CMS determined that the percentage was not accurate or data suppressed by CMS for one or more quarters. 9 The number of residents or resident stays is too small to report. Call the facility to discuss this quality measure. 10 The data for this measure is missing or was not submitted. Call the facility to  discuss this quality measure. 12 This facility either did not submit staffing data, has reported a high number of days without a registered nurse onsite, or submitted data that could not be verified through an audit. 13 Results are based on a shorter time period than required. 14 This nursing home is not required to submit data for the [place] Reporting Program. 18 This facility is not rated due to a history of serious quality issues and is included in the special focus facility program. 4 out of 5 stars 4 out of 5 stars 4 out of 5 stars 4 out of 5 stars 1 out of 5 stars 1 out of 5 stars 2 out of 5 stars          Expected Discharge Plan and Services                                                 Social Determinants of Health (SDOH) Interventions    Readmission Risk Interventions No flowsheet data found.

## 2018-12-13 NOTE — Progress Notes (Signed)
PROGRESS NOTE                                                                                                                                                                                                             Patient Demographics:    Randall Coleman, is a 68 y.o. male, DOB - 1950-11-21, [id number]  Admit date - 12/09/2018   Admitting Physician Karmen Bongo, MD  Outpatient Primary MD for the patient is System, Pcp Not In  LOS - 2  Outpatient Specialists: None  Chief Complaint  Patient presents with   Fall       Brief Narrative  68 year old male with history of DVT status post IVC, on Eliquis, diabetes mellitus, hypertension presented with fall and syncopal episode.  Reported being increasingly weak for past few days, had a dizzy spell and fell without sustaining any head injury.  Patient reported that his legs were giving out.  He was fine by the property manager on the concrete next to the car and was confused.  He had some bleeding from his head and elbows. In the ED head CT was negative.  EKG unremarkable.  High-sensitivity troponin was mildly elevated.  He also had mildly elevated d-dimer.  CT angiogram of the chest was negative for PE but showed a metallic foreign body in the right pulmonary artery which appeared to be fragmented IVC.  IR was consulted.     Subjective:   Denies any pain or SOB   Assessment  & Plan :    Principal Problem:   Syncope and collapse Suspect orthostasis.  No signs of ischemia.  TSH normal.  2D echo with normal EF with LVH.  Also noted for dilated ascending aorta 4 cm (incidental finding).  CT angiogram of the chest negative for PE. Seen by PT who recommended SNF.  Active Problems:   Foreign body in the pulmonary artery Linear metallic density in the left main pulmonary artery on CT angiogram which appears to be fragment of the IVC.  Scheduled to be retrieved by IR  today.  Eliquis held for procedure and will be resumed after that.  Fracture of the right second metatarsal. Likely secondary to fall with x-ray showing mildly displaced fracture of the base of the second metatarsal.  Orthopedic consulted, recommended short leg splint, nonweightbearing on right leg and to remain  in splint until outpatient follow-up with Dr. Lucia Gaskins. PT recommends SNF.  Dilatation of ascending aorta, 4 cm Incidental finding.  Recommend outpatient referral to vascular surgery.  History of DVT with IVC filter Resume Eliquis following IR procedure  Chronic depression Continue Wellbutrin, BuSpar, Abilify and Cogentin.  Patient on multiple medications which may have contributed to orthostasis.  Need to be reviewed and adjusted as outpatient  Diabetes mellitus type 2, uncontrolled with polyneuropathy A1c of 7.3.  Added Lantus 10 units daily with sliding scale coverage    Code Status : Full code  Family Communication  : None  Disposition Plan  : SNF possibly tomorrow once IR procedure completed today and stable overnight  Barriers For Discharge : Acute symptoms Consults  : Orthopedics, IR  Procedures  : CT angiogram of the chest, 2D echo, IR removal of foreign body in pulmonary artery  DVT Prophylaxis  : Eliquis  Lab Results  Component Value Date   PLT 143 (L) 12/13/2018    Antibiotics  :    Anti-infectives (From admission, onward)   None        Objective:   Vitals:   12/12/18 2105 12/13/18 0510 12/13/18 0514 12/13/18 0516  BP: (!) 177/85 (!) 157/82 (!) 167/66 (!) 148/75  Pulse: 66 74 (!) 48 (!) 52  Resp: 18 18    Temp: 98.2 F (36.8 C) 98.1 F (36.7 C)    TempSrc: Oral Oral    SpO2: 94% 95%    Weight:    111 kg  Height:        Wt Readings from Last 3 Encounters:  12/13/18 111 kg     Intake/Output Summary (Last 24 hours) at 12/13/2018 0941 Last data filed at 12/13/2018 0345 Gross per 24 hour  Intake 120 ml  Output 1150 ml  Net -1030 ml      Physical Exam  Gen: not in distress HEENT:  moist mucosa, supple neck Chest: clear b/l, no added sounds CVS: N S1&S2, no murmurs,  GI: soft, NT, ND,  Musculoskeletal: warm, no edema, right foot splint CNS: alert and oriented    Data Review:    CBC Recent Labs  Lab 12/09/18 2304 12/11/18 0550 12/12/18 0507 12/13/18 0530  WBC 8.4 6.5 6.2 5.4  HGB 13.7 13.4 13.7 13.6  HCT 40.1 40.1 41.2 40.6  PLT 146* 128* 145* 143*  MCV 83.0 85.0 85.7 83.5  MCH 28.4 28.4 28.5 28.0  MCHC 34.2 33.4 33.3 33.5  RDW 12.7 12.8 12.7 12.6  LYMPHSABS 1.0  --   --   --   MONOABS 0.8  --   --   --   EOSABS 0.2  --   --   --   BASOSABS 0.0  --   --   --     Chemistries  Recent Labs  Lab 12/09/18 2304 12/11/18 0550 12/12/18 0507 12/13/18 0530  NA 135 136 135 136  K 4.0 4.0 4.3 4.3  CL 101 102 103 103  CO2 23 22 22 23   GLUCOSE 213* 266* 259* 239*  BUN 14 13 13 13   CREATININE 0.89 0.70 0.75 0.72  CALCIUM 8.9 8.6* 8.5* 8.7*  AST 28  --   --   --   ALT 28  --   --   --   ALKPHOS 41  --   --   --   BILITOT 0.8  --   --   --    ------------------------------------------------------------------------------------------------------------------ No results for input(s): CHOL, HDL, LDLCALC,  TRIG, CHOLHDL, LDLDIRECT in the last 72 hours.  Lab Results  Component Value Date   HGBA1C 7.3 (H) 12/10/2018   ------------------------------------------------------------------------------------------------------------------ Recent Labs    12/10/18 1023  TSH 1.061   ------------------------------------------------------------------------------------------------------------------ No results for input(s): VITAMINB12, FOLATE, FERRITIN, TIBC, IRON, RETICCTPCT in the last 72 hours.  Coagulation profile Recent Labs  Lab 12/09/18 2304  INR 1.1    No results for input(s): DDIMER in the last 72 hours.  Cardiac Enzymes No results for input(s): CKMB, TROPONINI, MYOGLOBIN in the last 168  hours.  Invalid input(s): CK ------------------------------------------------------------------------------------------------------------------ No results found for: BNP  Inpatient Medications  Scheduled Meds:  ARIPiprazole  20 mg Oral Daily   aspirin EC  81 mg Oral Daily   benztropine  1 mg Oral Daily   buPROPion  150 mg Oral Daily   busPIRone  15 mg Oral TID   gabapentin  300 mg Oral TID   insulin aspart  0-15 Units Subcutaneous TID WC   insulin aspart  0-5 Units Subcutaneous QHS   insulin glargine  10 Units Subcutaneous QHS   lisinopril  5 mg Oral Daily   multivitamin with minerals  1 tablet Oral Daily   nicotine  21 mg Transdermal Daily   pantoprazole  40 mg Oral Daily   propranolol  10 mg Oral Daily   sodium chloride flush  3 mL Intravenous Q12H   traZODone  200 mg Oral QHS   Continuous Infusions:  lactated ringers 10 mL/hr at 12/11/18 1725   PRN Meds:.acetaminophen **OR** acetaminophen, ondansetron **OR** ondansetron (ZOFRAN) IV  Micro Results Recent Results (from the past 240 hour(s))  SARS CORONAVIRUS 2 (TAT 6-24 HRS) Nasopharyngeal Nasopharyngeal Swab     Status: None   Collection Time: 12/10/18  1:40 AM   Specimen: Nasopharyngeal Swab  Result Value Ref Range Status   SARS Coronavirus 2 NEGATIVE NEGATIVE Final    Comment: (NOTE) SARS-CoV-2 target nucleic acids are NOT DETECTED. The SARS-CoV-2 RNA is generally detectable in upper and lower respiratory specimens during the acute phase of infection. Negative results do not preclude SARS-CoV-2 infection, do not rule out co-infections with other pathogens, and should not be used as the sole basis for treatment or other patient management decisions. Negative results must be combined with clinical observations, patient history, and epidemiological information. The expected result is Negative. Fact Sheet for Patients: SugarRoll.be Fact Sheet for Healthcare  Providers: https://www.woods-mathews.com/ This test is not yet approved or cleared by the [place] FDA and  has been authorized for detection and/or diagnosis of SARS-CoV-2 by FDA under an Emergency Use Authorization (EUA). This EUA will remain  in effect (meaning this test can be used) for the duration of the COVID-19 declaration under Section 56 4(b)(1) of the Act, 21 U.S.C. section 360bbb-3(b)(1), unless the authorization is terminated or revoked sooner. Performed at [facility], [place] [address], [place], [place] [address]     Radiology Reports Ct Abdomen Wo Contrast  Result Date: 12/10/2018 CLINICAL DATA:  Linear wire like metallic foreign body in the LEFT main pulmonary artery. EXAM: CT ABDOMEN WITHOUT CONTRAST TECHNIQUE: Multidetector CT imaging of the abdomen was performed following the standard protocol without IV contrast. COMPARISON:  CT chest same day FINDINGS: Lower chest:  Lung bases are clear. Hepatobiliary: No focal hepatic lesion.  Gallbladder normal Pancreas: Normal pancreatic parenchymal intensity. No ductal dilatation or inflammation. Spleen: Normal spleen. Adrenals/urinary tract: Adrenal glands and kidneys are normal. Stomach/Bowel: Stomach and limited of the small bowel is unremarkable Vascular/Lymphatic:  Interval calcification of abdominal aorta. Infrarenal IVC filter in appropriate position. There is no evidence fracture of the filter. The struts appear intact. Musculoskeletal: No aggressive osseous lesion IMPRESSION: Infrarenal IVC filter in appropriate position. No hardware fracture evident. Electronically Signed   By: Suzy Bouchard M.D.   On: 12/10/2018 06:05   Dg Chest 2 View  Result Date: 12/09/2018 CLINICAL DATA:  Fall EXAM: CHEST - 2 VIEW COMPARISON:  None. FINDINGS: Basilar atelectatic changes. Question small granuloma versus vessel en face the right cardiophrenic sulcus. No consolidation, features of edema, pneumothorax, or effusion.  Pulmonary vascularity is normally distributed. The cardiomediastinal contours are unremarkable. No acute osseous or soft tissue abnormality. Degenerative changes are present in the imaged spine and shoulders. 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 IMPRESSION: 1. No acute cardiopulmonary disease or traumatic finding in the chest. 2. Question small granuloma versus vessel en face in the right lung base. Electronically Signed   By: Lovena Le M.D.   On: 12/09/2018 22:34   Ct Head Wo Contrast  Result Date: 12/09/2018 CLINICAL DATA:  Found down outside of apartment EXAM: CT HEAD WITHOUT CONTRAST CT CERVICAL SPINE WITHOUT CONTRAST TECHNIQUE: Multidetector CT imaging of the head and cervical spine was performed following the standard protocol without intravenous contrast. Multiplanar CT image reconstructions of the cervical spine were also generated. COMPARISON:  None. FINDINGS: CT HEAD FINDINGS Brain: No evidence of acute infarction, hemorrhage, hydrocephalus, extra-axial collection or mass lesion/mass effect. Symmetric prominence of the ventricles, cisterns and sulci compatible with parenchymal volume loss. Patchy areas of white matter hypoattenuation are most compatible with chronic microvascular angiopathy. Vascular: Atherosclerotic calcification of the carotid siphons and intradural vertebral arteries. No hyperdense vessel. Skull: Mild right supraorbital soft tissue swelling. No calvarial fracture or suspicious osseous lesion. Sinuses/Orbits: Leftward nasal septal deviation. Paranasal sinuses and mastoid air cells are predominantly clear. Pneumatization of the petrous apices. Small amount of debris in the right external auditory canal. Included orbital structures are unremarkable. Other: Edentulous. CT CERVICAL SPINE FINDINGS Alignment: Cervical stabilization collar in place. There is slight exaggeration of normal cervical lordosis. No  traumatic listhesis. Craniocervical and atlantoaxial articulations are normally aligned. No abnormal facet widening. Skull base and vertebrae: No acute fracture. No primary bone lesion or focal pathologic process. From Soft tissues and spinal canal: No pre or paravertebral fluid or swelling. No visible canal hematoma. Disc levels: Multilevel intervertebral disc height loss with spondylitic endplate changes and facet hypertrophic changes. Disc osteophyte complexes at C3-4, C4-5 and C6-7 result in at most mild canal stenosis. Uncinate spurring and facet hypertrophy results in mild-to-moderate multilevel foraminal narrowing throughout the cervical spine. Upper chest: No acute abnormality in the upper chest or imaged lung apices. Other: Included portions of the thyroid are unremarkable. There is calcification in the carotid bifurcations. IMPRESSION: 1. No acute intracranial abnormality. 2. Mild right supraorbital soft tissue swelling. 3. No acute cervical spine fracture or traumatic listhesis. 4. Multilevel cervical spondylitic changes, as above. Electronically Signed   By: Lovena Le M.D.   On: 12/09/2018 22:49   Ct Angio Chest Pe W And/or Wo Contrast  Result Date: 12/10/2018 CLINICAL DATA:  Fall, found outside apartment. History of blood clots. Positive D-dimer. EXAM: CT ANGIOGRAPHY CHEST WITH CONTRAST TECHNIQUE: Multidetector CT imaging of the chest was performed using the standard protocol during bolus administration of intravenous contrast. Multiplanar CT image reconstructions and MIPs were obtained to evaluate the vascular anatomy. CONTRAST:  176mL OMNIPAQUE IOHEXOL 350 MG/ML SOLN  COMPARISON:  Same day chest radiograph FINDINGS: Cardiovascular: Satisfactory opacification of the pulmonary arteries to the segmental level. No central, lobar or segmental filling defects are identified. There is a linear, metallic density along the posterior aspect of left main pulmonary artery approximately 3.6 cm in length.  Atherosclerotic plaque within the normal caliber aorta. No periaortic stranding. Normal heart size. No pericardial effusion. Insert coronary Mediastinum/Nodes: No enlarged mediastinal or axillary lymph nodes. Thyroid gland, trachea, and esophagus demonstrate no significant findings. Few calcified right hilar nodes. Lungs/Pleura: Dependent atelectasis seen posteriorly. No consolidation, features of edema, pneumothorax, or effusion. Calcified granuloma seen in the right lung base corresponding to the radiographic abnormality. No suspicious pulmonary nodules or masses. Upper Abdomen: No acute abnormalities present in the visualized portions of the upper abdomen. Musculoskeletal: Tiny hypoattenuating 1 cm structure associated with the epidermal air along the right chest wall likely reflecting an epidermal inclusion cyst. No acute osseous abnormality or suspicious osseous lesion. Multilevel degenerative changes are present in the imaged portions of the spine. 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 Remote posttraumatic deformity of the right anterior fifth rib. Review of the MIP images confirms the above findings. IMPRESSION: 1. No evidence of pulmonary embolism. No acute intrathoracic process. 2. Linear, metallic density along the posterior aspect of the left main pulmonary artery approximately 3.6 cm in length, favored to represent a metallic foreign body. Question the presence of an inferior vena cava filter on lateral chest radiograph, could reflect caval filter strut or fragmented guidewire. Could consider abdominal radiograph to confirm the presence of a caval filter and if present CT of the abdomen and pelvis would need to be obtained to assess the structural integrity as this will likely require retrieval. 3. Evidence of remote granulomatous disease in the chest. 4. Aortic Atherosclerosis (ICD10-I70.0). 5. Coronary atherosclerosis. These  results were called by telephone at the time of interpretation on 12/10/2018 at 3:26 am to provider Montine Circle , who verbally acknowledged these results. Electronically Signed   By: Lovena Le M.D.   On: 12/10/2018 03:26   Ct Cervical Spine Wo Contrast  Result Date: 12/09/2018 CLINICAL DATA:  Found down outside of apartment EXAM: CT HEAD WITHOUT CONTRAST CT CERVICAL SPINE WITHOUT CONTRAST TECHNIQUE: Multidetector CT imaging of the head and cervical spine was performed following the standard protocol without intravenous contrast. Multiplanar CT image reconstructions of the cervical spine were also generated. COMPARISON:  None. FINDINGS: CT HEAD FINDINGS Brain: No evidence of acute infarction, hemorrhage, hydrocephalus, extra-axial collection or mass lesion/mass effect. Symmetric prominence of the ventricles, cisterns and sulci compatible with parenchymal volume loss. Patchy areas of white matter hypoattenuation are most compatible with chronic microvascular angiopathy. Vascular: Atherosclerotic calcification of the carotid siphons and intradural vertebral arteries. No hyperdense vessel. Skull: Mild right supraorbital soft tissue swelling. No calvarial fracture or suspicious osseous lesion. Sinuses/Orbits: Leftward nasal septal deviation. Paranasal sinuses and mastoid air cells are predominantly clear. Pneumatization of the petrous apices. Small amount of debris in the right external auditory canal. Included orbital structures are unremarkable. Other: Edentulous. CT CERVICAL SPINE FINDINGS Alignment: Cervical stabilization collar in place. There is slight exaggeration of normal cervical lordosis. No traumatic listhesis. Craniocervical and atlantoaxial articulations are normally aligned. No abnormal facet widening. Skull base and vertebrae: No acute fracture. No primary bone lesion or focal pathologic process. From Soft tissues and spinal canal: No pre or paravertebral fluid or swelling. No visible canal  hematoma. Disc levels: Multilevel intervertebral disc height loss  with spondylitic endplate changes and facet hypertrophic changes. Disc osteophyte complexes at C3-4, C4-5 and C6-7 result in at most mild canal stenosis. Uncinate spurring and facet hypertrophy results in mild-to-moderate multilevel foraminal narrowing throughout the cervical spine. Upper chest: No acute abnormality in the upper chest or imaged lung apices. Other: Included portions of the thyroid are unremarkable. There is calcification in the carotid bifurcations. IMPRESSION: 1. No acute intracranial abnormality. 2. Mild right supraorbital soft tissue swelling. 3. No acute cervical spine fracture or traumatic listhesis. 4. Multilevel cervical spondylitic changes, as above. Electronically Signed   By: Lovena Le M.D.   On: 12/09/2018 22:49   Dg Abd 2 Views  Result Date: 12/10/2018 CLINICAL DATA:  IVC filter location. EXAM: ABDOMEN - 2 VIEW COMPARISON:  None. FINDINGS: There is an IVC filter at the level of the renal vessels. There is excreted contrast within the renal pelvises and right ureter. Bowel gas pattern is normal. IMPRESSION: IVC filter at the level of the renal vessels. Electronically Signed   By: Ulyses Jarred M.D.   On: 12/10/2018 04:11   Dg Foot Complete Right  Result Date: 12/10/2018 CLINICAL DATA:  Redness, blister, and swelling on right foot, and 1-3rd MTPs. Pt denies recent injury. Hx DVT on left leg EXAM: RIGHT FOOT COMPLETE - 3+ VIEW COMPARISON:  None. FINDINGS: There is a mildly displaced fracture at the base of the second metatarsal with a small amount of lateral displacement of the metatarsal. No definite additional fracture in the adjacent metatarsals. Scattered degenerative changes. No focal bony lesion. Swelling about the anterior midfoot. IMPRESSION: Mildly displaced fracture at the base of the second metatarsal with a mild lateral displacement of the metatarsal, raising concern for Lisfranc injury. Recommend  orthopedic consultation and possible CT for further evaluation. Electronically Signed   By: Audie Pinto M.D.   On: 12/10/2018 10:08    Time Spent in minutes  25   Rashawn Rayman M.D on 12/13/2018 at 9:41 AM  Between 7am to 7pm - Pager - [phone]  After 7pm go to [email or web address] - password [facility]  [facility] -  Office  [phone]

## 2018-12-14 ENCOUNTER — Encounter (HOSPITAL_COMMUNITY): Payer: Self-pay | Admitting: Interventional Radiology

## 2018-12-14 DIAGNOSIS — T17808D Unspecified foreign body in other parts of respiratory tract causing other injury, subsequent encounter: Secondary | ICD-10-CM

## 2018-12-14 LAB — BASIC METABOLIC PANEL
Anion gap: 8 (ref 5–15)
BUN: 21 mg/dL (ref 8–23)
CO2: 24 mmol/L (ref 22–32)
Calcium: 9.1 mg/dL (ref 8.9–10.3)
Chloride: 105 mmol/L (ref 98–111)
Creatinine, Ser: 0.96 mg/dL (ref 0.61–1.24)
GFR calc Af Amer: >60 mL/min (ref >60)
GFR calc non Af Amer: >60 mL/min (ref >60)
Glucose, Bld: 228 mg/dL — ABNORMAL HIGH (ref 70–99)
Potassium: 4.2 mmol/L (ref 3.5–5.1)
Sodium: 137 mmol/L (ref 135–145)

## 2018-12-14 LAB — CBC
HCT: 41.6 % (ref 39.0–52.0)
Hemoglobin: 14 g/dL (ref 13.0–17.0)
MCH: 28.9 pg (ref 26.0–34.0)
MCHC: 33.7 g/dL (ref 30.0–36.0)
MCV: 85.8 fL (ref 80.0–100.0)
Platelets: 158 10*3/uL (ref 150–400)
RBC: 4.85 MIL/uL (ref 4.22–5.81)
RDW: 13 % (ref 11.5–15.5)
WBC: 7.1 10*3/uL (ref 4.0–10.5)
nRBC: 0 % (ref 0.0–0.2)

## 2018-12-14 LAB — GLUCOSE, CAPILLARY
Glucose-Capillary: 242 mg/dL — ABNORMAL HIGH (ref 70–99)
Glucose-Capillary: 282 mg/dL — ABNORMAL HIGH (ref 70–99)
Glucose-Capillary: 258 mg/dL — ABNORMAL HIGH (ref 70–99)
Glucose-Capillary: 219 mg/dL — ABNORMAL HIGH (ref 70–99)

## 2018-12-14 NOTE — Progress Notes (Signed)
Inpatient Diabetes Program Recommendations  AACE/ADA: New Consensus Statement on Inpatient Glycemic Control (2015)  Target Ranges:  Prepandial:   less than 140 mg/dL      Peak postprandial:   less than 180 mg/dL (1-2 hours)      Critically ill patients:  140 - 180 mg/dL   Lab Results  Component Value Date   GLUCAP 242 (H) 12/14/2018   HGBA1C 7.3 (H) 12/10/2018    Review of Glycemic Control Results for Randall Coleman, Randall Coleman (MRN [id number]) as of 12/13/2018 10:20  Ref. Range 12/12/2018 07:59 12/12/2018 11:53 12/12/2018 15:56 12/12/2018 21:03 12/13/2018 07:50  Glucose-Capillary Latest Ref Range: 70 - 99 mg/dL 257 (H) 211 (H) 174 (H) 193 (H) 245 (H)  Results for Randall Coleman, Randall Coleman (MRN [id number]) as of 12/14/2018 09:47  Ref. Range 12/13/2018 07:50 12/13/2018 11:39 12/13/2018 16:26 12/13/2018 22:26 12/14/2018 07:39  Glucose-Capillary Latest Ref Range: 70 - 99 mg/dL 245 (H) Novolog 5 units 213 (H) Novolog 5 units 133 (H) Novolog 2 units given 3 hours later 313 (H) Novolog 4 units  Lantus 10 units 242 (H) Novolog 5 untis   Diabetes history: DM 2 Outpatient Diabetes medications: Glipizide 2.5 mg bis, Metformin 1000 mg bid Current orders for Inpatient glycemic control: Lantus 10 units qhs, Novolog 0-15 units tid + hs  Inpatient Diabetes Program Recommendations:    Glucose 245 this am after Lantus 10 units added last night. Consider increasing Lantus to 16-18 units.  Thanks,  Tama Headings RN, MSN, BC-ADM Inpatient Diabetes Coordinator Team Pager [phone] (8a-5p)

## 2018-12-14 NOTE — Care Management Important Message (Signed)
Important Message  Patient Details IM Letter given to Dessa Phi RN to present to the Patient Name: Randall Coleman MRN: [id number] Date of Birth: September 24, 1950   Medicare Important Message Given:  Yes     Kerin Salen 12/14/2018, 12:10 PM

## 2018-12-14 NOTE — Progress Notes (Signed)
PROGRESS NOTE                                                                                                                                                                                                             Patient Demographics:    Randall Coleman, is a 68 y.o. male, DOB - 05/26/1950, AP:[id number]  Admit date - 12/09/2018   Admitting Physician Karmen Bongo, MD  Outpatient Primary MD for the patient is System, Pcp Not In  LOS - 3  Outpatient Specialists: None  Chief Complaint  Patient presents with   Fall       Brief Narrative  68 year old male with history of DVT status post IVC, on Eliquis, diabetes mellitus, hypertension presented with fall and syncopal episode.  Reported being increasingly weak for past few days, had a dizzy spell and fell without sustaining any head injury.  Patient reported that his legs were giving out.  He was fine by the property manager on the concrete next to the car and was confused.  He had some bleeding from his head and elbows. In the ED head CT was negative.  EKG unremarkable.  High-sensitivity troponin was mildly elevated.  He also had mildly elevated d-dimer.  CT angiogram of the chest was negative for PE but showed a metallic foreign body in the right pulmonary artery which appeared to be fragmented IVC.  IR was consulted.     Subjective:   No overnight events. patient refused to go to SNF but finally agreed   Assessment  & Plan :    Principal Problem:   Syncope and collapse Suspect orthostasis.  No signs of ischemia.  TSH normal.  2D echo with normal EF with LVH.  Also noted for dilated ascending aorta 4 cm (incidental finding).  CT angiogram of the chest negative for PE. Seen by PT , recommended SNF. Patient hesitant but finally agrees for short term rehab.  Active Problems:   Foreign body in the pulmonary artery Linear metallic density in the left main pulmonary  artery on CT angiogram which appears to be fragment of the IVC.  IR evaluated and scheduled for retrieval but reported "Limited visualization of linear metallic foreign body in the left pulmonary artery.  No evident fracture or missing segments from IVC filter. Percutaneous retrieval was deferred as patient is asymptomatic and there  was risk for vascular damage with intervention. patient agreed to the plan.    Fracture of the right second metatarsal. Likely secondary to fall with x-ray showing mildly displaced fracture of the base of the second metatarsal.  Orthopedic consulted, recommended short leg splint, nonweightbearing on right leg and to remain in splint until outpatient follow-up with Dr. Lucia Gaskins. PT recommends SNF.  Dilatation of ascending aorta, 4 cm Incidental finding.  Recommend outpatient referral to vascular surgery.  History of DVT with IVC filter Resumed Eliquis   Chronic depression Continue Wellbutrin, BuSpar, Abilify and Cogentin.  Patient on multiple medications which may have contributed to orthostasis.  Need to be reviewed and adjusted as outpatient  Diabetes mellitus type 2, uncontrolled with polyneuropathy A1c of 7.3.  Added Lantus 10 units daily with sliding scale coverage    Code Status : Full code  Family Communication  : None  Disposition Plan  : SNF referral sent. repeat COVID 19 testing sent Barriers For Discharge : active symptoms Consults  : Orthopedics, IR  Procedures  : CT angiogram of the chest, 2D echo, IR removal of foreign body in pulmonary artery  DVT Prophylaxis  : Eliquis  Lab Results  Component Value Date   PLT 158 12/14/2018    Antibiotics  :    Anti-infectives (From admission, onward)   None        Objective:   Vitals:   12/14/18 0501 12/14/18 0522 12/14/18 0729 12/14/18 0918  BP: 140/65 113/69 (!) 158/72 (!) 141/61  Pulse: 86 71 80   Resp: 18 18    Temp: 98.5 F (36.9 C) 98.5 F (36.9 C) 98.6 F (37 C)   TempSrc:  Oral Oral Oral   SpO2: 98% (!) 89% 94%   Weight:      Height:        Wt Readings from Last 3 Encounters:  12/13/18 111 kg     Intake/Output Summary (Last 24 hours) at 12/14/2018 1257 Last data filed at 12/13/2018 1311 Gross per 24 hour  Intake 0 ml  Output 200 ml  Net -200 ml     Physical Exam NAD  HEENT: moist mucosa  chest: clear  CVS: NS1&S2 GI: soft, NT, ND  musculoskeletal: warm, rt foot splint      Data Review:    CBC Recent Labs  Lab 12/09/18 2304 12/11/18 0550 12/12/18 0507 12/13/18 0530 12/14/18 0444  WBC 8.4 6.5 6.2 5.4 7.1  HGB 13.7 13.4 13.7 13.6 14.0  HCT 40.1 40.1 41.2 40.6 41.6  PLT 146* 128* 145* 143* 158  MCV 83.0 85.0 85.7 83.5 85.8  MCH 28.4 28.4 28.5 28.0 28.9  MCHC 34.2 33.4 33.3 33.5 33.7  RDW 12.7 12.8 12.7 12.6 13.0  LYMPHSABS 1.0  --   --   --   --   MONOABS 0.8  --   --   --   --   EOSABS 0.2  --   --   --   --   BASOSABS 0.0  --   --   --   --     Chemistries  Recent Labs  Lab 12/09/18 2304 12/11/18 0550 12/12/18 0507 12/13/18 0530 12/14/18 0444  NA 135 136 135 136 137  K 4.0 4.0 4.3 4.3 4.2  CL 101 102 103 103 105  CO2 23 22 22 23 24   GLUCOSE 213* 266* 259* 239* 228*  BUN 14 13 13 13 21   CREATININE 0.89 0.70 0.75 0.72 0.96  CALCIUM 8.9 8.6* 8.5*  8.7* 9.1  AST 28  --   --   --   --   ALT 28  --   --   --   --   ALKPHOS 41  --   --   --   --   BILITOT 0.8  --   --   --   --    ------------------------------------------------------------------------------------------------------------------ No results for input(s): CHOL, HDL, LDLCALC, TRIG, CHOLHDL, LDLDIRECT in the last 72 hours.  Lab Results  Component Value Date   HGBA1C 7.3 (H) 12/10/2018   ------------------------------------------------------------------------------------------------------------------ No results for input(s): TSH, T4TOTAL, T3FREE, THYROIDAB in the last 72 hours.  Invalid input(s):  FREET3 ------------------------------------------------------------------------------------------------------------------ No results for input(s): VITAMINB12, FOLATE, FERRITIN, TIBC, IRON, RETICCTPCT in the last 72 hours.  Coagulation profile Recent Labs  Lab 12/09/18 2304  INR 1.1    No results for input(s): DDIMER in the last 72 hours.  Cardiac Enzymes No results for input(s): CKMB, TROPONINI, MYOGLOBIN in the last 168 hours.  Invalid input(s): CK ------------------------------------------------------------------------------------------------------------------ No results found for: BNP  Inpatient Medications  Scheduled Meds:  ARIPiprazole  20 mg Oral Daily   aspirin EC  81 mg Oral Daily   benztropine  1 mg Oral Daily   buPROPion  150 mg Oral Daily   busPIRone  15 mg Oral TID   gabapentin  300 mg Oral TID   insulin aspart  0-15 Units Subcutaneous TID WC   insulin aspart  0-5 Units Subcutaneous QHS   insulin glargine  10 Units Subcutaneous QHS   lisinopril  5 mg Oral Daily   multivitamin with minerals  1 tablet Oral Daily   nicotine  21 mg Transdermal Daily   pantoprazole  40 mg Oral Daily   propranolol  10 mg Oral Daily   sodium chloride flush  3 mL Intravenous Q12H   traZODone  200 mg Oral QHS   Continuous Infusions:  lactated ringers 10 mL/hr at 12/11/18 1725   PRN Meds:.acetaminophen **OR** acetaminophen, ondansetron **OR** ondansetron (ZOFRAN) IV  Micro Results Recent Results (from the past 240 hour(s))  SARS CORONAVIRUS 2 (TAT 6-24 HRS) Nasopharyngeal Nasopharyngeal Swab     Status: None   Collection Time: 12/10/18  1:40 AM   Specimen: Nasopharyngeal Swab  Result Value Ref Range Status   SARS Coronavirus 2 NEGATIVE NEGATIVE Final    Comment: (NOTE) SARS-CoV-2 target nucleic acids are NOT DETECTED. The SARS-CoV-2 RNA is generally detectable in upper and lower respiratory specimens during the acute phase of infection. Negative results do  not preclude SARS-CoV-2 infection, do not rule out co-infections with other pathogens, and should not be used as the sole basis for treatment or other patient management decisions. Negative results must be combined with clinical observations, patient history, and epidemiological information. The expected result is Negative. Fact Sheet for Patients: SugarRoll.be Fact Sheet for Healthcare Providers: https://www.woods-mathews.com/ This test is not yet approved or cleared by the [place] FDA and  has been authorized for detection and/or diagnosis of SARS-CoV-2 by FDA under an Emergency Use Authorization (EUA). This EUA will remain  in effect (meaning this test can be used) for the duration of the COVID-19 declaration under Section 56 4(b)(1) of the Act, 21 U.S.C. section 360bbb-3(b)(1), unless the authorization is terminated or revoked sooner. Performed at [facility], [place] [address], [place], [place] [address]     Radiology Reports Ct Abdomen Wo Contrast  Result Date: 12/10/2018 CLINICAL DATA:  Linear wire like metallic foreign body in the LEFT main  pulmonary artery. EXAM: CT ABDOMEN WITHOUT CONTRAST TECHNIQUE: Multidetector CT imaging of the abdomen was performed following the standard protocol without IV contrast. COMPARISON:  CT chest same day FINDINGS: Lower chest:  Lung bases are clear. Hepatobiliary: No focal hepatic lesion.  Gallbladder normal Pancreas: Normal pancreatic parenchymal intensity. No ductal dilatation or inflammation. Spleen: Normal spleen. Adrenals/urinary tract: Adrenal glands and kidneys are normal. Stomach/Bowel: Stomach and limited of the small bowel is unremarkable Vascular/Lymphatic: Interval calcification of abdominal aorta. Infrarenal IVC filter in appropriate position. There is no evidence fracture of the filter. The struts appear intact. Musculoskeletal: No aggressive osseous lesion IMPRESSION: Infrarenal IVC  filter in appropriate position. No hardware fracture evident. Electronically Signed   By: Suzy Bouchard M.D.   On: 12/10/2018 06:05   Dg Chest 2 View  Result Date: 12/09/2018 CLINICAL DATA:  Fall EXAM: CHEST - 2 VIEW COMPARISON:  None. FINDINGS: Basilar atelectatic changes. Question small granuloma versus vessel en face the right cardiophrenic sulcus. No consolidation, features of edema, pneumothorax, or effusion. Pulmonary vascularity is normally distributed. The cardiomediastinal contours are unremarkable. No acute osseous or soft tissue abnormality. Degenerative changes are present in the imaged spine and shoulders. 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 IMPRESSION: 1. No acute cardiopulmonary disease or traumatic finding in the chest. 2. Question small granuloma versus vessel en face in the right lung base. Electronically Signed   By: Lovena Le M.D.   On: 12/09/2018 22:34   Ct Head Wo Contrast  Result Date: 12/09/2018 CLINICAL DATA:  Found down outside of apartment EXAM: CT HEAD WITHOUT CONTRAST CT CERVICAL SPINE WITHOUT CONTRAST TECHNIQUE: Multidetector CT imaging of the head and cervical spine was performed following the standard protocol without intravenous contrast. Multiplanar CT image reconstructions of the cervical spine were also generated. COMPARISON:  None. FINDINGS: CT HEAD FINDINGS Brain: No evidence of acute infarction, hemorrhage, hydrocephalus, extra-axial collection or mass lesion/mass effect. Symmetric prominence of the ventricles, cisterns and sulci compatible with parenchymal volume loss. Patchy areas of white matter hypoattenuation are most compatible with chronic microvascular angiopathy. Vascular: Atherosclerotic calcification of the carotid siphons and intradural vertebral arteries. No hyperdense vessel. Skull: Mild right supraorbital soft tissue swelling. No calvarial fracture or suspicious osseous  lesion. Sinuses/Orbits: Leftward nasal septal deviation. Paranasal sinuses and mastoid air cells are predominantly clear. Pneumatization of the petrous apices. Small amount of debris in the right external auditory canal. Included orbital structures are unremarkable. Other: Edentulous. CT CERVICAL SPINE FINDINGS Alignment: Cervical stabilization collar in place. There is slight exaggeration of normal cervical lordosis. No traumatic listhesis. Craniocervical and atlantoaxial articulations are normally aligned. No abnormal facet widening. Skull base and vertebrae: No acute fracture. No primary bone lesion or focal pathologic process. From Soft tissues and spinal canal: No pre or paravertebral fluid or swelling. No visible canal hematoma. Disc levels: Multilevel intervertebral disc height loss with spondylitic endplate changes and facet hypertrophic changes. Disc osteophyte complexes at C3-4, C4-5 and C6-7 result in at most mild canal stenosis. Uncinate spurring and facet hypertrophy results in mild-to-moderate multilevel foraminal narrowing throughout the cervical spine. Upper chest: No acute abnormality in the upper chest or imaged lung apices. Other: Included portions of the thyroid are unremarkable. There is calcification in the carotid bifurcations. IMPRESSION: 1. No acute intracranial abnormality. 2. Mild right supraorbital soft tissue swelling. 3. No acute cervical spine fracture or traumatic listhesis. 4. Multilevel cervical spondylitic changes, as above. Electronically Signed   By: Elwin Sleight.D.  On: 12/09/2018 22:49   Ct Angio Chest Pe W And/or Wo Contrast  Result Date: 12/10/2018 CLINICAL DATA:  Fall, found outside apartment. History of blood clots. Positive D-dimer. EXAM: CT ANGIOGRAPHY CHEST WITH CONTRAST TECHNIQUE: Multidetector CT imaging of the chest was performed using the standard protocol during bolus administration of intravenous contrast. Multiplanar CT image reconstructions and MIPs were  obtained to evaluate the vascular anatomy. CONTRAST:  131mL OMNIPAQUE IOHEXOL 350 MG/ML SOLN COMPARISON:  Same day chest radiograph FINDINGS: Cardiovascular: Satisfactory opacification of the pulmonary arteries to the segmental level. No central, lobar or segmental filling defects are identified. There is a linear, metallic density along the posterior aspect of left main pulmonary artery approximately 3.6 cm in length. Atherosclerotic plaque within the normal caliber aorta. No periaortic stranding. Normal heart size. No pericardial effusion. Insert coronary Mediastinum/Nodes: No enlarged mediastinal or axillary lymph nodes. Thyroid gland, trachea, and esophagus demonstrate no significant findings. Few calcified right hilar nodes. Lungs/Pleura: Dependent atelectasis seen posteriorly. No consolidation, features of edema, pneumothorax, or effusion. Calcified granuloma seen in the right lung base corresponding to the radiographic abnormality. No suspicious pulmonary nodules or masses. Upper Abdomen: No acute abnormalities present in the visualized portions of the upper abdomen. Musculoskeletal: Tiny hypoattenuating 1 cm structure associated with the epidermal air along the right chest wall likely reflecting an epidermal inclusion cyst. No acute osseous abnormality or suspicious osseous lesion. Multilevel degenerative changes are present in the imaged portions of the spine. 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 Remote posttraumatic deformity of the right anterior fifth rib. Review of the MIP images confirms the above findings. IMPRESSION: 1. No evidence of pulmonary embolism. No acute intrathoracic process. 2. Linear, metallic density along the posterior aspect of the left main pulmonary artery approximately 3.6 cm in length, favored to represent a metallic foreign body. Question the presence of an inferior vena cava filter on lateral chest  radiograph, could reflect caval filter strut or fragmented guidewire. Could consider abdominal radiograph to confirm the presence of a caval filter and if present CT of the abdomen and pelvis would need to be obtained to assess the structural integrity as this will likely require retrieval. 3. Evidence of remote granulomatous disease in the chest. 4. Aortic Atherosclerosis (ICD10-I70.0). 5. Coronary atherosclerosis. These results were called by telephone at the time of interpretation on 12/10/2018 at 3:26 am to provider Montine Circle , who verbally acknowledged these results. Electronically Signed   By: Lovena Le M.D.   On: 12/10/2018 03:26   Ct Cervical Spine Wo Contrast  Result Date: 12/09/2018 CLINICAL DATA:  Found down outside of apartment EXAM: CT HEAD WITHOUT CONTRAST CT CERVICAL SPINE WITHOUT CONTRAST TECHNIQUE: Multidetector CT imaging of the head and cervical spine was performed following the standard protocol without intravenous contrast. Multiplanar CT image reconstructions of the cervical spine were also generated. COMPARISON:  None. FINDINGS: CT HEAD FINDINGS Brain: No evidence of acute infarction, hemorrhage, hydrocephalus, extra-axial collection or mass lesion/mass effect. Symmetric prominence of the ventricles, cisterns and sulci compatible with parenchymal volume loss. Patchy areas of white matter hypoattenuation are most compatible with chronic microvascular angiopathy. Vascular: Atherosclerotic calcification of the carotid siphons and intradural vertebral arteries. No hyperdense vessel. Skull: Mild right supraorbital soft tissue swelling. No calvarial fracture or suspicious osseous lesion. Sinuses/Orbits: Leftward nasal septal deviation. Paranasal sinuses and mastoid air cells are predominantly clear. Pneumatization of the petrous apices. Small amount of debris in the right external auditory canal. Included orbital structures  are unremarkable. Other: Edentulous. CT CERVICAL SPINE  FINDINGS Alignment: Cervical stabilization collar in place. There is slight exaggeration of normal cervical lordosis. No traumatic listhesis. Craniocervical and atlantoaxial articulations are normally aligned. No abnormal facet widening. Skull base and vertebrae: No acute fracture. No primary bone lesion or focal pathologic process. From Soft tissues and spinal canal: No pre or paravertebral fluid or swelling. No visible canal hematoma. Disc levels: Multilevel intervertebral disc height loss with spondylitic endplate changes and facet hypertrophic changes. Disc osteophyte complexes at C3-4, C4-5 and C6-7 result in at most mild canal stenosis. Uncinate spurring and facet hypertrophy results in mild-to-moderate multilevel foraminal narrowing throughout the cervical spine. Upper chest: No acute abnormality in the upper chest or imaged lung apices. Other: Included portions of the thyroid are unremarkable. There is calcification in the carotid bifurcations. IMPRESSION: 1. No acute intracranial abnormality. 2. Mild right supraorbital soft tissue swelling. 3. No acute cervical spine fracture or traumatic listhesis. 4. Multilevel cervical spondylitic changes, as above. Electronically Signed   By: Lovena Le M.D.   On: 12/09/2018 22:49   Ir Chest Fluoro  Result Date: 12/14/2018 INDICATION: Linear intravascular foreign body identified incidentally in left pulmonary artery on recent CT. Remote history of IVC filter placement. EXAM: CHEST FLUOROSCOPY COMPARISON:  CT 12/10/2018 MEDICATIONS: None indicated ANESTHESIA/SEDATION: None required FLUOROSCOPY TIME:  2.6 minutes; [id number]  uGym2 DAP COMPLICATIONS: None immediate. TECHNIQUE: Informed written consent was obtained from the patient after a thorough discussion of the procedural risks, benefits and alternatives. All questions were addressed. Fluoroscopic inspection of the left pulmonary artery and IVC filter regions was performed in multiple projections and magnifications in  preparation for possible attempted foreign body retrieval. FINDINGS: Linear metallic foreign body confirmed in the region of the proximal left pulmonary artery. This can be seen in obliquity and end-on in a very limited set projections. In most projections, the item is indistinct, obscured by overlying anatomic structures. Given the very limited visualization, patient's current asymptomatic status, and potential risk of vascular damage with intervention, and after consultation with the patient, attempted percutaneous/transcatheter foreign body retrieval was deferred. Additional images of the IVC filter were obtained. There does appear to be mild maldeployment with overlap of 2 of the wall struts and some tilt of the apex, but no fracture or convincing missing segment to account for the foreign body in the left pulmonary artery. IMPRESSION: 1. Limited visualization of linear metallic foreign body in the left pulmonary artery. Percutaneous  retrieval was deferred.  See discussion above. 2. No evident fracture or missing segments from IVC filter. Electronically Signed   By: Lucrezia Europe M.D.   On: 12/14/2018 07:30   Dg Abd 2 Views  Result Date: 12/10/2018 CLINICAL DATA:  IVC filter location. EXAM: ABDOMEN - 2 VIEW COMPARISON:  None. FINDINGS: There is an IVC filter at the level of the renal vessels. There is excreted contrast within the renal pelvises and right ureter. Bowel gas pattern is normal. IMPRESSION: IVC filter at the level of the renal vessels. Electronically Signed   By: Ulyses Jarred M.D.   On: 12/10/2018 04:11   Dg Foot Complete Right  Result Date: 12/10/2018 CLINICAL DATA:  Redness, blister, and swelling on right foot, and 1-3rd MTPs. Pt denies recent injury. Hx DVT on left leg EXAM: RIGHT FOOT COMPLETE - 3+ VIEW COMPARISON:  None. FINDINGS: There is a mildly displaced fracture at the base of the second metatarsal with a small amount of lateral displacement of the metatarsal. No definite  additional fracture in the adjacent metatarsals. Scattered degenerative changes. No focal bony lesion. Swelling about the anterior midfoot. IMPRESSION: Mildly displaced fracture at the base of the second metatarsal with a mild lateral displacement of the metatarsal, raising concern for Lisfranc injury. Recommend orthopedic consultation and possible CT for further evaluation. Electronically Signed   By: Audie Pinto M.D.   On: 12/10/2018 10:08    Time Spent in minutes  25   Richele Strand M.D on 12/14/2018 at 12:57 PM  Between 7am to 7pm - Pager - [phone]  After 7pm go to [email or web address] - password [facility]  [facility] -  Office  [phone]

## 2018-12-14 NOTE — Progress Notes (Signed)
Physical Therapy Treatment Patient Details Name: Randall Coleman MRN: [id number] DOB: 1951/01/14 Today's Date: 12/14/2018    History of Present Illness 68 yo male admitted with syncope, collapse, fall. Imaging showed foreign body in pulmonary artery-IR following. Imaging (+) R 2nd met fx with displacement (? Lisfranc injury). Orthopedics consulted-non op management of fx. Hx of DM, DVT    PT Comments    Pt attempted a couple sit to stands however poor adherence to R LE NWB.  Pt assisted to drop arm recliner by lateral/scoot transfer.  Pt reports d/c to SNF for rehab tomorrow.   Follow Up Recommendations  SNF     Equipment Recommendations  Rolling walker with 5" wheels;3in1 (PT)    Recommendations for Other Services       Precautions / Restrictions Precautions Precautions: Fall Required Braces or Orthoses: Splint/Cast Splint/Cast: R lower leg/foot Restrictions Weight Bearing Restrictions: Yes RLE Weight Bearing: Non weight bearing    Mobility  Bed Mobility Overal bed mobility: Needs Assistance Bed Mobility: Supine to Sit     Supine to sit: Supervision;HOB elevated        Transfers Overall transfer level: Needs assistance Equipment used: Rolling walker (2 wheeled) Transfers: Sit to/from Stand;Lateral/Scoot Transfers Sit to Stand: Mod assist        Lateral/Scoot Transfers: Min assist General transfer comment: performed 2 sit to stands and pt with difficulty maintaining NWB, assist for rise, steady and control descent; cues for lateral/scoot from bed to drop arm recliner, therapist kept R foot elevated for NWB  Ambulation/Gait                 Stairs             Wheelchair Mobility    Modified Rankin (Stroke Patients Only)       Balance Overall balance assessment: Needs assistance;History of Falls       Postural control: Posterior lean Standing balance support: Bilateral upper extremity supported Standing balance-Leahy Scale:  Poor Standing balance comment: requires UE support and external assist                            Cognition Arousal/Alertness: Awake/alert Behavior During Therapy: WFL for tasks assessed/performed Overall Cognitive Status: Within Functional Limits for tasks assessed                                 General Comments: ?memory (pt states something is wrong with his foot, maybe "broken", explained fracture diagnosis and NWB)      Exercises      General Comments        Pertinent Vitals/Pain Pain Assessment: Faces Faces Pain Scale: Hurts little more Pain Location: R foot Pain Descriptors / Indicators: Discomfort;Grimacing Pain Intervention(s): Monitored during session;Repositioned    Home Living                      Prior Function            PT Goals (current goals can now be found in the care plan section) Progress towards PT goals: Progressing toward goals    Frequency    Min 3X/week      PT Plan Current plan remains appropriate    Co-evaluation              AM-PAC PT "6 Clicks" Mobility   Outcome Measure  Help needed turning from your  back to your side while in a flat bed without using bedrails?: A Little Help needed moving from lying on your back to sitting on the side of a flat bed without using bedrails?: A Little Help needed moving to and from a bed to a chair (including a wheelchair)?: A Lot Help needed standing up from a chair using your arms (e.g., wheelchair or bedside chair)?: A Lot Help needed to walk in hospital room?: Total Help needed climbing 3-5 steps with a railing? : Total 6 Click Score: 12    End of Session Equipment Utilized During Treatment: Gait belt Activity Tolerance: Patient tolerated treatment well Patient left: in chair;with chair alarm set;with call bell/phone within reach Nurse Communication: Mobility status(secretary aware to notify nursing staff (unavailable at time) for drop arm recliner  and lateral transfer for back to bed) PT Visit Diagnosis: Other abnormalities of gait and mobility (R26.89);History of falling (Z91.81)     Time: 9198-0221 PT Time Calculation (min) (ACUTE ONLY): 16 min  Charges:  $Therapeutic Activity: 8-22 mins                     Carmelia Bake, PT, DPT Acute Rehabilitation Services Office: [phone] Pager: [phone]  Trena Platt 12/14/2018, 4:38 PM

## 2018-12-14 NOTE — TOC Progression Note (Signed)
Transition of Care [facility]) - Progression Note    Patient Details  Name: Darell Grahl MRN: [id number] Date of Birth: [birth year]  [facility] of [facility]) CM/SW Contact  Jaja Switalski, Juliann Pulse, RN Phone Number: 12/14/2018, 12:56 PM  Clinical Narrative:Patient agree to [facility] chosen-rep Juliann Pulse following.await covid. Plan d/c in am.       Expected Discharge Plan: [place] Barriers to Discharge: Continued Medical Work up  Expected Discharge Plan and Services Expected Discharge Plan: [place] Choice: [place] arrangements for the past 2 months: Single Family Home                                       Social Determinants of Health (SDOH) Interventions    Readmission Risk Interventions No flowsheet data found.

## 2018-12-15 DIAGNOSIS — R531 Weakness: Secondary | ICD-10-CM

## 2018-12-15 DIAGNOSIS — F419 Anxiety disorder, unspecified: Secondary | ICD-10-CM | POA: Diagnosis present

## 2018-12-15 DIAGNOSIS — I480 Paroxysmal atrial fibrillation: Secondary | ICD-10-CM

## 2018-12-15 DIAGNOSIS — F329 Major depressive disorder, single episode, unspecified: Secondary | ICD-10-CM | POA: Diagnosis not present

## 2018-12-15 LAB — GLUCOSE, CAPILLARY
Glucose-Capillary: 241 mg/dL — ABNORMAL HIGH (ref 70–99)
Glucose-Capillary: 236 mg/dL — ABNORMAL HIGH (ref 70–99)
Glucose-Capillary: 383 mg/dL — ABNORMAL HIGH (ref 70–99)
Glucose-Capillary: 202 mg/dL — ABNORMAL HIGH (ref 70–99)

## 2018-12-15 LAB — NOVEL CORONAVIRUS, NAA (HOSP ORDER, SEND-OUT TO REF LAB; TAT 18-24 HRS): SARS-CoV-2, NAA: NOT DETECTED

## 2018-12-15 MED ORDER — TRAZODONE HCL 100 MG PO TABS: 100.0000 mg | ORAL_TABLET | Freq: Every day | ORAL | 0 refills | Status: DC

## 2018-12-15 MED ORDER — ARIPIPRAZOLE 20 MG PO TABS: 10.0000 mg | ORAL_TABLET | Freq: Every day | ORAL | 0 refills | Status: DC

## 2018-12-15 MED ORDER — NICOTINE 21 MG/24HR TD PT24: 21.0000 mg | MEDICATED_PATCH | Freq: Every day | TRANSDERMAL | 0 refills | Status: DC

## 2018-12-15 MED ORDER — INSULIN GLARGINE 100 UNIT/ML ~~LOC~~ SOLN: 16.0000 [IU] | Freq: Every day | SUBCUTANEOUS | 11 refills | Status: DC

## 2018-12-15 MED ORDER — LISINOPRIL 10 MG PO TABS: 10.0000 mg | ORAL_TABLET | Freq: Every day | ORAL | 0 refills | Status: DC

## 2018-12-15 MED ORDER — BUSPIRONE HCL 15 MG PO TABS: 15.0000 mg | ORAL_TABLET | Freq: Two times a day (BID) | ORAL | 0 refills | Status: DC

## 2018-12-15 MED ORDER — INSULIN GLARGINE 100 UNIT/ML ~~LOC~~ SOLN
16.0000 [IU] | Freq: Every day | SUBCUTANEOUS | Status: DC
  Administered 2018-12-15: 16 [IU] via SUBCUTANEOUS
  Filled 2018-12-15 (×2): qty 0.16

## 2018-12-15 NOTE — Progress Notes (Signed)
Inpatient Diabetes Program Recommendations  AACE/ADA: New Consensus Statement on Inpatient Glycemic Control (2015)  Target Ranges:  Prepandial:   less than 140 mg/dL      Peak postprandial:   less than 180 mg/dL (1-2 hours)      Critically ill patients:  140 - 180 mg/dL   Lab Results  Component Value Date   GLUCAP 241 (H) 12/15/2018   HGBA1C 7.3 (H) 12/10/2018    Review of Glycemic Control  Results for Randall Coleman, Randall Coleman (MRN [id number]) as of 12/14/2018 09:47  Ref. Range 12/13/2018 07:50 12/13/2018 11:39 12/13/2018 16:26 12/13/2018 22:26 12/14/2018 07:39  Glucose-Capillary Latest Ref Range: 70 - 99 mg/dL 245 (H) Novolog 5 units 213 (H) Novolog 5 units 133 (H) Novolog 2 units given 3 hours later 313 (H) Novolog 4 units  Lantus 10 units 242 (H) Novolog 5 untis  Results for QUINTO, MAHLMAN (MRN [id number]) as of 12/15/2018 10:49  Ref. Range 12/14/2018 07:39 12/14/2018 12:10 12/14/2018 16:08 12/14/2018 20:51 12/15/2018 07:32  Glucose-Capillary Latest Ref Range: 70 - 99 mg/dL 242 (H) 282 (H) 258 (H) 219 (H) 241 (H)   Diabetes history: DM 2 Outpatient Diabetes medications: Glipizide 2.5 mg bis, Metformin 1000 mg bid Current orders for Inpatient glycemic control: Lantus 10 units qhs, Novolog 0-15 units tid + hs  Inpatient Diabetes Program Recommendations:    Glucose 240's this am.. Consider increasing Lantus to 16-18 units.  Thanks,  Tama Headings RN, MSN, BC-ADM Inpatient Diabetes Coordinator Team Pager [phone] (8a-5p)

## 2018-12-15 NOTE — Discharge Summary (Addendum)
Physician Discharge Summary  Randall Coleman [id number] DOB: 01/27/1951 DOA: 12/09/2018  PCP: System, Pcp Not In  Admit date: 12/09/2018 Discharge date: 12/16/2018  Admitted From: Home Disposition: Skilled nursing facility  Recommendations for Outpatient Follow-up:  1. Follow-up with orthopedic surgery Dr. Lucia Gaskins in 1-2 weeks. 2. Patient needs to be nonweightbearing on right leg until cleared by orthopedics. 3. Please provide outpatient referral to vascular surgery for dilated ascending aorta.  Please review his multiple home psych medications during outpatient follow-up.   Equipment/Devices: As per therapy at rehab  Discharge Condition: Fair CODE STATUS: Full code Diet recommendation: Carb modified   Discharge Diagnoses:  Principal Problem:   Syncope and collapse   Active Problems:   Foreign body in lung   Foot injury, right, initial encounter   Essential hypertension   History of DVT of lower extremity   Type 2 diabetes mellitus without complication (HCC)   Unspecified atrial fibrillation ([place])   Foot fracture, right   Major depressive disorder   Anxiety  Brief narrative/HPI 68 year old male with history of DVT status post IVC, on Eliquis, diabetes mellitus, hypertension presented with fall and syncopal episode.  Reported being increasingly weak for past few days, had a dizzy spell and fell without sustaining any head injury.  Patient reported that his legs were giving out.  He was fine by the property manager on the concrete next to the car and was confused.  He had some bleeding from his head and elbows. In the ED head CT was negative.  EKG unremarkable.  High-sensitivity troponin was mildly elevated.  He also had mildly elevated d-dimer.  CT angiogram of the chest was negative for PE but showed a metallic foreign body in the right pulmonary artery which appeared to be fragmented IVC.  IR was consulted.  Hospital course  Principal Problem:   Syncope and  collapse Suspect orthostasis.  No signs of ischemia.  TSH normal.  2D echo with normal EF with LVH.  Also noted for dilated ascending aorta 4 cm (incidental finding).  CT angiogram of the chest negative for PE. Seen by PT , recommended SNF. Patient hesitant but finally agrees for short term rehab.  Active Problems:   Foreign body in the pulmonary artery Linear metallic density in the left main pulmonary artery on CT angiogram which appears to be fragment of the IVC.  IR evaluated and scheduled for retrieval but reported "Limited visualization of linear metallic foreign body in the left pulmonary artery.  No evident fracture or missing segments from IVC filter. Percutaneous retrieval was deferred as patient is asymptomatic and there was risk for vascular damage with intervention. patient agreed to the plan.    Fracture of the right second metatarsal. Likely secondary to fall with x-ray showing mildly displaced fracture of the base of the second metatarsal.  Orthopedic consulted, recommended short leg splint, nonweightbearing on right leg and to remain in splint until outpatient follow-up with Dr. Lucia Gaskins. PT recommends SNF. Patient has not required any narcotics for pain.  Dilatation of ascending aorta, 4 cm Incidental finding.  Recommend outpatient referral to vascular surgery.  History of DVT with IVC filter Continue Eliquis   Major depressive disorder/anxiety/?  PTSD On multiple medication including Wellbutrin, BuSpar, Abilify, Cogentin and trazodone.  I have reduced the dose of Abilify, BuSpar and trazodone. medications need to be reviewed during outpatient follow-up at the [place].  Patient reports he is not sure why he is on so many medications. Polypharmacy may be contributing to his syncope  with orthostasis.   Uncontrolled hypertension Elevated blood pressure.  Increased lisinopril dose.  Diabetes mellitus type 2, uncontrolled with polyneuropathy A1c of 7.3.    CBG has been  elevated while in the hospital.  Resume home dose oral hypoglycemics on discharge and added Lantus 16 units at bedtime.    Family Communication  : None  Disposition Plan  : SNF   Consult: Orthopedics  Procedures  : CT angiogram of the chest, 2D echo, IR removal of foreign body in pulmonary artery   Discharge Instructions   Allergies as of 12/15/2018   No Known Allergies     Medication List    TAKE these medications   apixaban 5 MG Tabs tablet Commonly known as: ELIQUIS Take 5 mg by mouth 2 (two) times daily.   ARIPiprazole 20 MG tablet Commonly known as: ABILIFY Take 0.5 tablets (10 mg total) by mouth daily. What changed: how much to take   aspirin EC 81 MG tablet Take 81 mg by mouth daily.   benztropine 1 MG tablet Commonly known as: COGENTIN Take 1 mg by mouth daily.   buPROPion 150 MG 24 hr tablet Commonly known as: WELLBUTRIN XL Take 150 mg by mouth daily.   busPIRone 15 MG tablet Commonly known as: BUSPAR Take 1 tablet (15 mg total) by mouth 2 (two) times daily. What changed: when to take this   gabapentin 300 MG capsule Commonly known as: NEURONTIN Take 300 mg by mouth 3 (three) times daily.   glipiZIDE 5 MG tablet Commonly known as: GLUCOTROL Take 2.5 mg by mouth 2 (two) times daily.  Insulin glargine 100 unit/mL injection Inject 0.16 mL's (16 units total) into the skin at bedtime  lisinopril 10 MG tablet Commonly known as: ZESTRIL Take 1 tablet (10 mg total) by mouth daily. What changed: how much to take   metFORMIN 1000 MG (MOD) 24 hr tablet Commonly known as: GLUMETZA Take 1,000 mg by mouth 2 (two) times daily.   Multi-Vitamin tablet Take 1 tablet by mouth daily.   nicotine 21 mg/24hr patch Commonly known as: NICODERM CQ - dosed in mg/24 hours Place 1 patch (21 mg total) onto the skin daily.   omeprazole 20 MG capsule Commonly known as: PRILOSEC Take 20 mg by mouth 2 (two) times daily.   propranolol 10 MG tablet Commonly  known as: INDERAL Take 10 mg by mouth daily.   traZODone 100 MG tablet Commonly known as: DESYREL Take 1 tablet (100 mg total) by mouth at bedtime. What changed: how much to take       Contact information for follow-up providers    Erle Crocker, MD. Schedule an appointment as soon as possible for a visit in 2 week(s).   Specialty: Orthopedic Surgery Why: Follow-up right foot fracture Contact information: [place] [address] [phone]            Contact information for after-discharge care    Destination    [facility] .   Service: Skilled Nursing Contact information: [address] [place] [phone]                 No Known Allergies    Procedures/Studies: Ct Abdomen Wo Contrast  Result Date: 12/10/2018 CLINICAL DATA:  Linear wire like metallic foreign body in the LEFT main pulmonary artery. EXAM: CT ABDOMEN WITHOUT CONTRAST TECHNIQUE: Multidetector CT imaging of the abdomen was performed following the standard protocol without IV contrast. COMPARISON:  CT chest  same day FINDINGS: Lower chest:  Lung bases are clear. Hepatobiliary: No focal hepatic lesion.  Gallbladder normal Pancreas: Normal pancreatic parenchymal intensity. No ductal dilatation or inflammation. Spleen: Normal spleen. Adrenals/urinary tract: Adrenal glands and kidneys are normal. Stomach/Bowel: Stomach and limited of the small bowel is unremarkable Vascular/Lymphatic: Interval calcification of abdominal aorta. Infrarenal IVC filter in appropriate position. There is no evidence fracture of the filter. The struts appear intact. Musculoskeletal: No aggressive osseous lesion IMPRESSION: Infrarenal IVC filter in appropriate position. No hardware fracture evident. Electronically Signed   By: Suzy Bouchard M.D.   On: 12/10/2018 06:05   Dg Chest 2 View  Result Date: 12/09/2018 CLINICAL DATA:  Fall EXAM: CHEST - 2 VIEW  COMPARISON:  None. FINDINGS: Basilar atelectatic changes. Question small granuloma versus vessel en face the right cardiophrenic sulcus. No consolidation, features of edema, pneumothorax, or effusion. Pulmonary vascularity is normally distributed. The cardiomediastinal contours are unremarkable. No acute osseous or soft tissue abnormality. Degenerative changes are present in the imaged spine and shoulders. 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 IMPRESSION: 1. No acute cardiopulmonary disease or traumatic finding in the chest. 2. Question small granuloma versus vessel en face in the right lung base. Electronically Signed   By: Lovena Le M.D.   On: 12/09/2018 22:34   Ct Head Wo Contrast  Result Date: 12/09/2018 CLINICAL DATA:  Found down outside of apartment EXAM: CT HEAD WITHOUT CONTRAST CT CERVICAL SPINE WITHOUT CONTRAST TECHNIQUE: Multidetector CT imaging of the head and cervical spine was performed following the standard protocol without intravenous contrast. Multiplanar CT image reconstructions of the cervical spine were also generated. COMPARISON:  None. FINDINGS: CT HEAD FINDINGS Brain: No evidence of acute infarction, hemorrhage, hydrocephalus, extra-axial collection or mass lesion/mass effect. Symmetric prominence of the ventricles, cisterns and sulci compatible with parenchymal volume loss. Patchy areas of white matter hypoattenuation are most compatible with chronic microvascular angiopathy. Vascular: Atherosclerotic calcification of the carotid siphons and intradural vertebral arteries. No hyperdense vessel. Skull: Mild right supraorbital soft tissue swelling. No calvarial fracture or suspicious osseous lesion. Sinuses/Orbits: Leftward nasal septal deviation. Paranasal sinuses and mastoid air cells are predominantly clear. Pneumatization of the petrous apices. Small amount of debris in the right external auditory canal.  Included orbital structures are unremarkable. Other: Edentulous. CT CERVICAL SPINE FINDINGS Alignment: Cervical stabilization collar in place. There is slight exaggeration of normal cervical lordosis. No traumatic listhesis. Craniocervical and atlantoaxial articulations are normally aligned. No abnormal facet widening. Skull base and vertebrae: No acute fracture. No primary bone lesion or focal pathologic process. From Soft tissues and spinal canal: No pre or paravertebral fluid or swelling. No visible canal hematoma. Disc levels: Multilevel intervertebral disc height loss with spondylitic endplate changes and facet hypertrophic changes. Disc osteophyte complexes at C3-4, C4-5 and C6-7 result in at most mild canal stenosis. Uncinate spurring and facet hypertrophy results in mild-to-moderate multilevel foraminal narrowing throughout the cervical spine. Upper chest: No acute abnormality in the upper chest or imaged lung apices. Other: Included portions of the thyroid are unremarkable. There is calcification in the carotid bifurcations. IMPRESSION: 1. No acute intracranial abnormality. 2. Mild right supraorbital soft tissue swelling. 3. No acute cervical spine fracture or traumatic listhesis. 4. Multilevel cervical spondylitic changes, as above. Electronically Signed   By: Lovena Le M.D.   On: 12/09/2018 22:49   Ct Angio Chest Pe W And/or Wo Contrast  Result Date: 12/10/2018 CLINICAL DATA:  Fall, found outside apartment. History of  blood clots. Positive D-dimer. EXAM: CT ANGIOGRAPHY CHEST WITH CONTRAST TECHNIQUE: Multidetector CT imaging of the chest was performed using the standard protocol during bolus administration of intravenous contrast. Multiplanar CT image reconstructions and MIPs were obtained to evaluate the vascular anatomy. CONTRAST:  151mL OMNIPAQUE IOHEXOL 350 MG/ML SOLN COMPARISON:  Same day chest radiograph FINDINGS: Cardiovascular: Satisfactory opacification of the pulmonary arteries to the  segmental level. No central, lobar or segmental filling defects are identified. There is a linear, metallic density along the posterior aspect of left main pulmonary artery approximately 3.6 cm in length. Atherosclerotic plaque within the normal caliber aorta. No periaortic stranding. Normal heart size. No pericardial effusion. Insert coronary Mediastinum/Nodes: No enlarged mediastinal or axillary lymph nodes. Thyroid gland, trachea, and esophagus demonstrate no significant findings. Few calcified right hilar nodes. Lungs/Pleura: Dependent atelectasis seen posteriorly. No consolidation, features of edema, pneumothorax, or effusion. Calcified granuloma seen in the right lung base corresponding to the radiographic abnormality. No suspicious pulmonary nodules or masses. Upper Abdomen: No acute abnormalities present in the visualized portions of the upper abdomen. Musculoskeletal: Tiny hypoattenuating 1 cm structure associated with the epidermal air along the right chest wall likely reflecting an epidermal inclusion cyst. No acute osseous abnormality or suspicious osseous lesion. Multilevel degenerative changes are present in the imaged portions of the spine. 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 Remote posttraumatic deformity of the right anterior fifth rib. Review of the MIP images confirms the above findings. IMPRESSION: 1. No evidence of pulmonary embolism. No acute intrathoracic process. 2. Linear, metallic density along the posterior aspect of the left main pulmonary artery approximately 3.6 cm in length, favored to represent a metallic foreign body. Question the presence of an inferior vena cava filter on lateral chest radiograph, could reflect caval filter strut or fragmented guidewire. Could consider abdominal radiograph to confirm the presence of a caval filter and if present CT of the abdomen and pelvis would need to be obtained to  assess the structural integrity as this will likely require retrieval. 3. Evidence of remote granulomatous disease in the chest. 4. Aortic Atherosclerosis (ICD10-I70.0). 5. Coronary atherosclerosis. These results were called by telephone at the time of interpretation on 12/10/2018 at 3:26 am to provider Montine Circle , who verbally acknowledged these results. Electronically Signed   By: Lovena Le M.D.   On: 12/10/2018 03:26   Ct Cervical Spine Wo Contrast  Result Date: 12/09/2018 CLINICAL DATA:  Found down outside of apartment EXAM: CT HEAD WITHOUT CONTRAST CT CERVICAL SPINE WITHOUT CONTRAST TECHNIQUE: Multidetector CT imaging of the head and cervical spine was performed following the standard protocol without intravenous contrast. Multiplanar CT image reconstructions of the cervical spine were also generated. COMPARISON:  None. FINDINGS: CT HEAD FINDINGS Brain: No evidence of acute infarction, hemorrhage, hydrocephalus, extra-axial collection or mass lesion/mass effect. Symmetric prominence of the ventricles, cisterns and sulci compatible with parenchymal volume loss. Patchy areas of white matter hypoattenuation are most compatible with chronic microvascular angiopathy. Vascular: Atherosclerotic calcification of the carotid siphons and intradural vertebral arteries. No hyperdense vessel. Skull: Mild right supraorbital soft tissue swelling. No calvarial fracture or suspicious osseous lesion. Sinuses/Orbits: Leftward nasal septal deviation. Paranasal sinuses and mastoid air cells are predominantly clear. Pneumatization of the petrous apices. Small amount of debris in the right external auditory canal. Included orbital structures are unremarkable. Other: Edentulous. CT CERVICAL SPINE FINDINGS Alignment: Cervical stabilization collar in place. There is slight exaggeration of normal cervical lordosis. No traumatic listhesis. Craniocervical  and atlantoaxial articulations are normally aligned. No abnormal facet  widening. Skull base and vertebrae: No acute fracture. No primary bone lesion or focal pathologic process. From Soft tissues and spinal canal: No pre or paravertebral fluid or swelling. No visible canal hematoma. Disc levels: Multilevel intervertebral disc height loss with spondylitic endplate changes and facet hypertrophic changes. Disc osteophyte complexes at C3-4, C4-5 and C6-7 result in at most mild canal stenosis. Uncinate spurring and facet hypertrophy results in mild-to-moderate multilevel foraminal narrowing throughout the cervical spine. Upper chest: No acute abnormality in the upper chest or imaged lung apices. Other: Included portions of the thyroid are unremarkable. There is calcification in the carotid bifurcations. IMPRESSION: 1. No acute intracranial abnormality. 2. Mild right supraorbital soft tissue swelling. 3. No acute cervical spine fracture or traumatic listhesis. 4. Multilevel cervical spondylitic changes, as above. Electronically Signed   By: Lovena Le M.D.   On: 12/09/2018 22:49   Ir Chest Fluoro  Result Date: 12/14/2018 INDICATION: Linear intravascular foreign body identified incidentally in left pulmonary artery on recent CT. Remote history of IVC filter placement. EXAM: CHEST FLUOROSCOPY COMPARISON:  CT 12/10/2018 MEDICATIONS: None indicated ANESTHESIA/SEDATION: None required FLUOROSCOPY TIME:  2.6 minutes; [id number]  uGym2 DAP COMPLICATIONS: None immediate. TECHNIQUE: Informed written consent was obtained from the patient after a thorough discussion of the procedural risks, benefits and alternatives. All questions were addressed. Fluoroscopic inspection of the left pulmonary artery and IVC filter regions was performed in multiple projections and magnifications in preparation for possible attempted foreign body retrieval. FINDINGS: Linear metallic foreign body confirmed in the region of the proximal left pulmonary artery. This can be seen in obliquity and end-on in a very limited set  projections. In most projections, the item is indistinct, obscured by overlying anatomic structures. Given the very limited visualization, patient's current asymptomatic status, and potential risk of vascular damage with intervention, and after consultation with the patient, attempted percutaneous/transcatheter foreign body retrieval was deferred. Additional images of the IVC filter were obtained. There does appear to be mild maldeployment with overlap of 2 of the wall struts and some tilt of the apex, but no fracture or convincing missing segment to account for the foreign body in the left pulmonary artery. IMPRESSION: 1. Limited visualization of linear metallic foreign body in the left pulmonary artery. Percutaneous  retrieval was deferred.  See discussion above. 2. No evident fracture or missing segments from IVC filter. Electronically Signed   By: Lucrezia Europe M.D.   On: 12/14/2018 07:30   Dg Abd 2 Views  Result Date: 12/10/2018 CLINICAL DATA:  IVC filter location. EXAM: ABDOMEN - 2 VIEW COMPARISON:  None. FINDINGS: There is an IVC filter at the level of the renal vessels. There is excreted contrast within the renal pelvises and right ureter. Bowel gas pattern is normal. IMPRESSION: IVC filter at the level of the renal vessels. Electronically Signed   By: Ulyses Jarred M.D.   On: 12/10/2018 04:11   Dg Foot Complete Right  Result Date: 12/10/2018 CLINICAL DATA:  Redness, blister, and swelling on right foot, and 1-3rd MTPs. Pt denies recent injury. Hx DVT on left leg EXAM: RIGHT FOOT COMPLETE - 3+ VIEW COMPARISON:  None. FINDINGS: There is a mildly displaced fracture at the base of the second metatarsal with a small amount of lateral displacement of the metatarsal. No definite additional fracture in the adjacent metatarsals. Scattered degenerative changes. No focal bony lesion. Swelling about the anterior midfoot. IMPRESSION: Mildly displaced fracture at the base of  the second metatarsal with a mild  lateral displacement of the metatarsal, raising concern for Lisfranc injury. Recommend orthopedic consultation and possible CT for further evaluation. Electronically Signed   By: Audie Pinto M.D.   On: 12/10/2018 10:08       Subjective: No overnight events.  Feels better.  Discharge Exam: Vitals:   12/14/18 2051 12/15/18 0405  BP: (!) 161/79 (!) 153/78  Pulse: 78 77  Resp: 18 18  Temp: 98 F (36.7 C) 98.1 F (36.7 C)  SpO2: 94% 94%   Vitals:   12/14/18 0918 12/14/18 1402 12/14/18 2051 12/15/18 0405  BP: (!) 141/61 (!) 146/82 (!) 161/79 (!) 153/78  Pulse:  90 78 77  Resp:  19 18 18   Temp:  97.9 F (36.6 C) 98 F (36.7 C) 98.1 F (36.7 C)  TempSrc:  Oral Oral Oral  SpO2:  95% 94% 94%  Weight:    106.4 kg  Height:        General: Elderly male not in distress HEENT: Moist mucosa, supple neck Chest: Clear bilaterally CVs: Normal S1-S2 GI: Soft, nondistended, nontender Musculoskeletal: Warm, no edema, right foot cast    The results of significant diagnostics from this hospitalization (including imaging, microbiology, ancillary and laboratory) are listed below for reference.     Microbiology: Recent Results (from the past 240 hour(s))  SARS CORONAVIRUS 2 (TAT 6-24 HRS) Nasopharyngeal Nasopharyngeal Swab     Status: None   Collection Time: 12/10/18  1:40 AM   Specimen: Nasopharyngeal Swab  Result Value Ref Range Status   SARS Coronavirus 2 NEGATIVE NEGATIVE Final    Comment: (NOTE) SARS-CoV-2 target nucleic acids are NOT DETECTED. The SARS-CoV-2 RNA is generally detectable in upper and lower respiratory specimens during the acute phase of infection. Negative results do not preclude SARS-CoV-2 infection, do not rule out co-infections with other pathogens, and should not be used as the sole basis for treatment or other patient management decisions. Negative results must be combined with clinical observations, patient history, and epidemiological information.  The expected result is Negative. Fact Sheet for Patients: SugarRoll.be Fact Sheet for Healthcare Providers: https://www.woods-mathews.com/ This test is not yet approved or cleared by the [place] FDA and  has been authorized for detection and/or diagnosis of SARS-CoV-2 by FDA under an Emergency Use Authorization (EUA). This EUA will remain  in effect (meaning this test can be used) for the duration of the COVID-19 declaration under Section 56 4(b)(1) of the Act, 21 U.S.C. section 360bbb-3(b)(1), unless the authorization is terminated or revoked sooner. Performed at [facility], [place] [address] [place], [place] [address]      Labs: BNP (last 3 results) No results for input(s): BNP in the last 8760 hours. Basic Metabolic Panel: Recent Labs  Lab 12/09/18 2304 12/11/18 0550 12/12/18 0507 12/13/18 0530 12/14/18 0444  NA 135 136 135 136 137  K 4.0 4.0 4.3 4.3 4.2  CL 101 102 103 103 105  CO2 23 22 22 23 24   GLUCOSE 213* 266* 259* 239* 228*  BUN 14 13 13 13 21   CREATININE 0.89 0.70 0.75 0.72 0.96  CALCIUM 8.9 8.6* 8.5* 8.7* 9.1   Liver Function Tests: Recent Labs  Lab 12/09/18 2304  AST 28  ALT 28  ALKPHOS 41  BILITOT 0.8  PROT 6.4*  ALBUMIN 4.2   No results for input(s): LIPASE, AMYLASE in the last 168 hours. No results for input(s): AMMONIA in the last 168 hours. CBC: Recent Labs  Lab 12/09/18  2304 12/11/18 0550 12/12/18 0507 12/13/18 0530 12/14/18 0444  WBC 8.4 6.5 6.2 5.4 7.1  NEUTROABS 6.4  --   --   --   --   HGB 13.7 13.4 13.7 13.6 14.0  HCT 40.1 40.1 41.2 40.6 41.6  MCV 83.0 85.0 85.7 83.5 85.8  PLT 146* 128* 145* 143* 158   Cardiac Enzymes: No results for input(s): CKTOTAL, CKMB, CKMBINDEX, TROPONINI in the last 168 hours. BNP: Invalid input(s): POCBNP CBG: Recent Labs  Lab 12/14/18 0739 12/14/18 1210 12/14/18 1608 12/14/18 2051 12/15/18 0732  GLUCAP 242* 282* 258* 219* 241*    D-Dimer No results for input(s): DDIMER in the last 72 hours. Hgb A1c No results for input(s): HGBA1C in the last 72 hours. Lipid Profile No results for input(s): CHOL, HDL, LDLCALC, TRIG, CHOLHDL, LDLDIRECT in the last 72 hours. Thyroid function studies No results for input(s): TSH, T4TOTAL, T3FREE, THYROIDAB in the last 72 hours.  Invalid input(s): FREET3 Anemia work up No results for input(s): VITAMINB12, FOLATE, FERRITIN, TIBC, IRON, RETICCTPCT in the last 72 hours. Urinalysis    Component Value Date/Time   COLORURINE YELLOW 12/10/2018 0139   APPEARANCEUR CLEAR 12/10/2018 0139   LABSPEC 1.012 12/10/2018 0139   PHURINE 6.0 12/10/2018 0139   GLUCOSEU >=500 (A) 12/10/2018 0139   HGBUR NEGATIVE 12/10/2018 0139   BILIRUBINUR NEGATIVE 12/10/2018 0139   KETONESUR 5 (A) 12/10/2018 0139   PROTEINUR NEGATIVE 12/10/2018 0139   NITRITE NEGATIVE 12/10/2018 0139   LEUKOCYTESUR NEGATIVE 12/10/2018 0139   Sepsis Labs Invalid input(s): PROCALCITONIN,  WBC,  LACTICIDVEN Microbiology Recent Results (from the past 240 hour(s))  SARS CORONAVIRUS 2 (TAT 6-24 HRS) Nasopharyngeal Nasopharyngeal Swab     Status: None   Collection Time: 12/10/18  1:40 AM   Specimen: Nasopharyngeal Swab  Result Value Ref Range Status   SARS Coronavirus 2 NEGATIVE NEGATIVE Final    Comment: (NOTE) SARS-CoV-2 target nucleic acids are NOT DETECTED. The SARS-CoV-2 RNA is generally detectable in upper and lower respiratory specimens during the acute phase of infection. Negative results do not preclude SARS-CoV-2 infection, do not rule out co-infections with other pathogens, and should not be used as the sole basis for treatment or other patient management decisions. Negative results must be combined with clinical observations, patient history, and epidemiological information. The expected result is Negative. Fact Sheet for Patients: SugarRoll.be Fact Sheet for Healthcare  Providers: https://www.woods-mathews.com/ This test is not yet approved or cleared by the [place] FDA and  has been authorized for detection and/or diagnosis of SARS-CoV-2 by FDA under an Emergency Use Authorization (EUA). This EUA will remain  in effect (meaning this test can be used) for the duration of the COVID-19 declaration under Section 56 4(b)(1) of the Act, 21 U.S.C. section 360bbb-3(b)(1), unless the authorization is terminated or revoked sooner. Performed at [facility], [place] [address], [place], [place] [address]      Time coordinating discharge: Over 30 minutes  SIGNED:   Louellen Molder, MD  [facility] 12/15/2018, 8:00 AM Pager   If 7PM-7AM, please contact night-coverage [email or web address] Password [email or web address]

## 2018-12-15 NOTE — Discharge Instructions (Signed)
No weight bearing on right foot until seen by orthopedic surgeon

## 2018-12-16 LAB — GLUCOSE, CAPILLARY
Glucose-Capillary: 263 mg/dL — ABNORMAL HIGH (ref 70–99)
Glucose-Capillary: 398 mg/dL — ABNORMAL HIGH (ref 70–99)

## 2018-12-16 MED ORDER — INSULIN ASPART 100 UNIT/ML ~~LOC~~ SOLN
10.0000 [IU] | Freq: Once | SUBCUTANEOUS | Status: AC
  Administered 2018-12-16: 10 [IU] via SUBCUTANEOUS

## 2018-12-16 MED ORDER — INSULIN GLARGINE 100 UNIT/ML ~~LOC~~ SOLN
12.0000 [IU] | Freq: Every day | SUBCUTANEOUS | Status: DC
  Filled 2018-12-16: qty 0.12

## 2018-12-16 MED ORDER — INSULIN GLARGINE 100 UNIT/ML ~~LOC~~ SOLN: 12.0000 [IU] | Freq: Every day | SUBCUTANEOUS | 11 refills | Status: DC

## 2018-12-16 NOTE — TOC Transition Note (Signed)
Transition of Care [facility]) - CM/SW Discharge Note   Patient Details  Name: Randall Coleman MRN: [id number] Date of Birth: 11-22-1950  [facility]) CM/SW Contact:  Derric Dealmeida Dimitri Ped, LCSW Phone Number: 12/16/2018, 11:39 AM   Clinical Narrative:  Patient is scheduled to discharge to [facility] on today. Rm# [id number]. Number for Report is PH: [phone]. PTAR scheduled for 1:30PM.  [place] Transitions of Care  Clinical Social Worker  Ph: [phone]     Final next level of care: Skilled Nursing Facility Barriers to Discharge: No Barriers Identified   Patient Goals and CMS Choice Patient states their goals for this hospitalization and ongoing recovery are:: for patient to discharge to SNF for short term rehab CMS Medicare.gov Compare Post Acute Care list provided to:: Patient Choice offered to / list presented to : Patient  Discharge Placement PASRR number recieved: 12/13/18            Patient chooses bed at: [facility] Patient to be transferred to facility by: [facility] Name of family member notified: [id number] Patient and family notified of of transfer: 12/16/18  Discharge Plan and Services     Post Acute Care Choice: [facility]                               Social Determinants of Health (SDOH) Interventions     Readmission Risk Interventions No flowsheet data found.

## 2018-12-16 NOTE — Progress Notes (Signed)
Patient seen and examined.  No overnight events. Vitals reviewed and stable. Physical exam unchanged from yesterday.  Patient tested for negative for COVID-19 awaiting transfer to SNF.  Discharge summary completed and stable to be transferred to SNF.

## 2020-07-29 ENCOUNTER — Encounter (HOSPITAL_COMMUNITY): Payer: Self-pay | Admitting: Emergency Medicine

## 2020-07-29 ENCOUNTER — Emergency Department (HOSPITAL_COMMUNITY): Payer: No Typology Code available for payment source

## 2020-07-29 ENCOUNTER — Other Ambulatory Visit: Payer: Self-pay

## 2020-07-29 ENCOUNTER — Inpatient Hospital Stay (HOSPITAL_COMMUNITY)
Admission: EM | Admit: 2020-07-29 | Discharge: 2020-08-02 | DRG: 617 | Disposition: A | Discharge status: Home Health | Payer: No Typology Code available for payment source | Attending: Internal Medicine | Admitting: Internal Medicine

## 2020-07-29 DIAGNOSIS — M869 Osteomyelitis, unspecified: Secondary | ICD-10-CM | POA: Diagnosis not present

## 2020-07-29 DIAGNOSIS — E1165 Type 2 diabetes mellitus with hyperglycemia: Secondary | ICD-10-CM | POA: Diagnosis present

## 2020-07-29 DIAGNOSIS — L97529 Non-pressure chronic ulcer of other part of left foot with unspecified severity: Secondary | ICD-10-CM | POA: Diagnosis present

## 2020-07-29 DIAGNOSIS — Z20822 Contact with and (suspected) exposure to covid-19: Secondary | ICD-10-CM | POA: Diagnosis present

## 2020-07-29 DIAGNOSIS — I4891 Unspecified atrial fibrillation: Secondary | ICD-10-CM | POA: Diagnosis present

## 2020-07-29 DIAGNOSIS — Z79899 Other long term (current) drug therapy: Secondary | ICD-10-CM | POA: Diagnosis not present

## 2020-07-29 DIAGNOSIS — D649 Anemia, unspecified: Secondary | ICD-10-CM | POA: Diagnosis not present

## 2020-07-29 DIAGNOSIS — E119 Type 2 diabetes mellitus without complications: Secondary | ICD-10-CM | POA: Diagnosis not present

## 2020-07-29 DIAGNOSIS — E11621 Type 2 diabetes mellitus with foot ulcer: Secondary | ICD-10-CM | POA: Diagnosis present

## 2020-07-29 DIAGNOSIS — Z7901 Long term (current) use of anticoagulants: Secondary | ICD-10-CM | POA: Diagnosis not present

## 2020-07-29 DIAGNOSIS — E861 Hypovolemia: Secondary | ICD-10-CM | POA: Diagnosis present

## 2020-07-29 DIAGNOSIS — R112 Nausea with vomiting, unspecified: Secondary | ICD-10-CM | POA: Diagnosis not present

## 2020-07-29 DIAGNOSIS — F17210 Nicotine dependence, cigarettes, uncomplicated: Secondary | ICD-10-CM | POA: Diagnosis present

## 2020-07-29 DIAGNOSIS — E1142 Type 2 diabetes mellitus with diabetic polyneuropathy: Secondary | ICD-10-CM | POA: Diagnosis present

## 2020-07-29 DIAGNOSIS — I10 Essential (primary) hypertension: Secondary | ICD-10-CM | POA: Diagnosis present

## 2020-07-29 DIAGNOSIS — E1169 Type 2 diabetes mellitus with other specified complication: Secondary | ICD-10-CM | POA: Diagnosis present

## 2020-07-29 DIAGNOSIS — Z720 Tobacco use: Secondary | ICD-10-CM | POA: Diagnosis not present

## 2020-07-29 DIAGNOSIS — Z794 Long term (current) use of insulin: Secondary | ICD-10-CM

## 2020-07-29 DIAGNOSIS — L0390 Cellulitis, unspecified: Secondary | ICD-10-CM | POA: Diagnosis not present

## 2020-07-29 DIAGNOSIS — E871 Hypo-osmolality and hyponatremia: Secondary | ICD-10-CM | POA: Diagnosis present

## 2020-07-29 DIAGNOSIS — E1140 Type 2 diabetes mellitus with diabetic neuropathy, unspecified: Secondary | ICD-10-CM | POA: Diagnosis not present

## 2020-07-29 DIAGNOSIS — Z7984 Long term (current) use of oral hypoglycemic drugs: Secondary | ICD-10-CM | POA: Diagnosis not present

## 2020-07-29 DIAGNOSIS — Z7982 Long term (current) use of aspirin: Secondary | ICD-10-CM | POA: Diagnosis not present

## 2020-07-29 DIAGNOSIS — L089 Local infection of the skin and subcutaneous tissue, unspecified: Secondary | ICD-10-CM

## 2020-07-29 DIAGNOSIS — L02612 Cutaneous abscess of left foot: Secondary | ICD-10-CM

## 2020-07-29 DIAGNOSIS — Z01810 Encounter for preprocedural cardiovascular examination: Secondary | ICD-10-CM | POA: Diagnosis not present

## 2020-07-29 DIAGNOSIS — L03032 Cellulitis of left toe: Secondary | ICD-10-CM | POA: Diagnosis present

## 2020-07-29 DIAGNOSIS — F39 Unspecified mood [affective] disorder: Secondary | ICD-10-CM | POA: Diagnosis not present

## 2020-07-29 DIAGNOSIS — L03116 Cellulitis of left lower limb: Secondary | ICD-10-CM | POA: Diagnosis not present

## 2020-07-29 DIAGNOSIS — R197 Diarrhea, unspecified: Secondary | ICD-10-CM | POA: Diagnosis not present

## 2020-07-29 DIAGNOSIS — D6489 Other specified anemias: Secondary | ICD-10-CM | POA: Diagnosis present

## 2020-07-29 DIAGNOSIS — M86172 Other acute osteomyelitis, left ankle and foot: Secondary | ICD-10-CM | POA: Diagnosis present

## 2020-07-29 DIAGNOSIS — E11628 Type 2 diabetes mellitus with other skin complications: Principal | ICD-10-CM

## 2020-07-29 DIAGNOSIS — Z86718 Personal history of other venous thrombosis and embolism: Secondary | ICD-10-CM

## 2020-07-29 DIAGNOSIS — M79675 Pain in left toe(s): Secondary | ICD-10-CM | POA: Diagnosis present

## 2020-07-29 DIAGNOSIS — M8610 Other acute osteomyelitis, unspecified site: Secondary | ICD-10-CM | POA: Diagnosis not present

## 2020-07-29 LAB — HIV ANTIBODY (ROUTINE TESTING W REFLEX): HIV Screen 4th Generation wRfx: NONREACTIVE

## 2020-07-29 LAB — CBC WITH DIFFERENTIAL/PLATELET
Abs Immature Granulocytes: 0.06 10*3/uL (ref 0.00–0.07)
Basophils Absolute: 0 10*3/uL (ref 0.0–0.1)
Basophils Relative: 0 %
Eosinophils Absolute: 0.1 10*3/uL (ref 0.0–0.5)
Eosinophils Relative: 2 %
HCT: 36 % — ABNORMAL LOW (ref 39.0–52.0)
Hemoglobin: 12.5 g/dL — ABNORMAL LOW (ref 13.0–17.0)
Immature Granulocytes: 1 %
Lymphocytes Relative: 8 %
Lymphs Abs: 0.6 10*3/uL — ABNORMAL LOW (ref 0.7–4.0)
MCH: 28.1 pg (ref 26.0–34.0)
MCHC: 34.7 g/dL (ref 30.0–36.0)
MCV: 80.9 fL (ref 80.0–100.0)
Monocytes Absolute: 0.6 10*3/uL (ref 0.1–1.0)
Monocytes Relative: 8 %
Neutro Abs: 6.5 10*3/uL (ref 1.7–7.7)
Neutrophils Relative %: 81 %
Platelets: 257 10*3/uL (ref 150–400)
RBC: 4.45 MIL/uL (ref 4.22–5.81)
RDW: 13.2 % (ref 11.5–15.5)
WBC: 8 10*3/uL (ref 4.0–10.5)
nRBC: 0 % (ref 0.0–0.2)

## 2020-07-29 LAB — BASIC METABOLIC PANEL
Anion gap: 8 (ref 5–15)
Anion gap: 7 (ref 5–15)
BUN: 11 mg/dL (ref 8–23)
BUN: 10 mg/dL (ref 8–23)
CO2: 23 mmol/L (ref 22–32)
CO2: 26 mmol/L (ref 22–32)
Calcium: 8.7 mg/dL — ABNORMAL LOW (ref 8.9–10.3)
Calcium: 8.5 mg/dL — ABNORMAL LOW (ref 8.9–10.3)
Chloride: 91 mmol/L — ABNORMAL LOW (ref 98–111)
Chloride: 99 mmol/L (ref 98–111)
Creatinine, Ser: 1.17 mg/dL (ref 0.61–1.24)
Creatinine, Ser: 0.89 mg/dL (ref 0.61–1.24)
GFR, Estimated: >60 mL/min (ref >60)
GFR, Estimated: >60 mL/min (ref >60)
Glucose, Bld: 141 mg/dL — ABNORMAL HIGH (ref 70–99)
Glucose, Bld: 113 mg/dL — ABNORMAL HIGH (ref 70–99)
Potassium: 4.4 mmol/L (ref 3.5–5.1)
Potassium: 5.2 mmol/L — ABNORMAL HIGH (ref 3.5–5.1)
Sodium: 122 mmol/L — ABNORMAL LOW (ref 135–145)
Sodium: 132 mmol/L — ABNORMAL LOW (ref 135–145)

## 2020-07-29 LAB — LACTIC ACID, PLASMA
Lactic Acid, Venous: 0.7 mmol/L (ref 0.5–1.9)
Lactic Acid, Venous: 0.7 mmol/L (ref 0.5–1.9)

## 2020-07-29 LAB — RESP PANEL BY RT-PCR (FLU A&B, COVID) ARPGX2
Influenza A by PCR: NEGATIVE
Influenza B by PCR: NEGATIVE
SARS Coronavirus 2 by RT PCR: NEGATIVE

## 2020-07-29 LAB — OSMOLALITY: Osmolality: 262 mOsm/kg — ABNORMAL LOW (ref 275–295)

## 2020-07-29 LAB — SODIUM, URINE, RANDOM: Sodium, Ur: <10 mmol/L

## 2020-07-29 LAB — OSMOLALITY, URINE: Osmolality, Ur: 149 mOsm/kg — ABNORMAL LOW (ref 300–900)

## 2020-07-29 LAB — CBG MONITORING, ED: Glucose-Capillary: 157 mg/dL — ABNORMAL HIGH (ref 70–99)

## 2020-07-29 LAB — GLUCOSE, CAPILLARY: Glucose-Capillary: 111 mg/dL — ABNORMAL HIGH (ref 70–99)

## 2020-07-29 MED ORDER — SODIUM CHLORIDE 0.9 % IV BOLUS
500.0000 mL | Freq: Once | INTRAVENOUS | Status: AC
  Administered 2020-07-29: 500 mL via INTRAVENOUS

## 2020-07-29 MED ORDER — GABAPENTIN 300 MG PO CAPS
300.0000 mg | ORAL_CAPSULE | Freq: Three times a day (TID) | ORAL | Status: DC
  Administered 2020-07-29 – 2020-08-02 (×11): 300 mg via ORAL
  Filled 2020-07-29 (×11): qty 1

## 2020-07-29 MED ORDER — NICOTINE 21 MG/24HR TD PT24
21.0000 mg | MEDICATED_PATCH | Freq: Every day | TRANSDERMAL | Status: DC
  Administered 2020-07-29 – 2020-08-02 (×5): 21 mg via TRANSDERMAL
  Filled 2020-07-29 (×5): qty 1

## 2020-07-29 MED ORDER — HYDROMORPHONE HCL 1 MG/ML IJ SOLN
0.5000 mg | INTRAMUSCULAR | Status: DC | PRN
  Administered 2020-07-29 – 2020-08-02 (×13): 0.5 mg via INTRAVENOUS
  Filled 2020-07-29 (×13): qty 1

## 2020-07-29 MED ORDER — BENZTROPINE MESYLATE 1 MG PO TABS
1.0000 mg | ORAL_TABLET | Freq: Every day | ORAL | Status: DC
  Administered 2020-07-30 – 2020-08-02 (×4): 1 mg via ORAL
  Filled 2020-07-29 (×4): qty 1

## 2020-07-29 MED ORDER — LISINOPRIL 10 MG PO TABS
10.0000 mg | ORAL_TABLET | Freq: Every day | ORAL | Status: DC
  Administered 2020-07-30 – 2020-08-02 (×4): 10 mg via ORAL
  Filled 2020-07-29 (×4): qty 1

## 2020-07-29 MED ORDER — ARIPIPRAZOLE 5 MG PO TABS
10.0000 mg | ORAL_TABLET | Freq: Every day | ORAL | Status: DC
  Administered 2020-07-30 – 2020-08-02 (×4): 10 mg via ORAL
  Filled 2020-07-29 (×4): qty 2

## 2020-07-29 MED ORDER — ASPIRIN EC 81 MG PO TBEC: 81.0000 mg | DELAYED_RELEASE_TABLET | Freq: Every day | ORAL | Status: DC

## 2020-07-29 MED ORDER — ONDANSETRON HCL 4 MG/2ML IJ SOLN
4.0000 mg | Freq: Once | INTRAMUSCULAR | Status: AC
  Administered 2020-07-29: 4 mg via INTRAVENOUS
  Filled 2020-07-29: qty 2

## 2020-07-29 MED ORDER — HYDROMORPHONE HCL 1 MG/ML IJ SOLN
0.5000 mg | Freq: Once | INTRAMUSCULAR | Status: AC
  Administered 2020-07-29: 0.5 mg via INTRAVENOUS
  Filled 2020-07-29: qty 1

## 2020-07-29 MED ORDER — SODIUM CHLORIDE 0.9 % IV SOLN
2.0000 g | Freq: Three times a day (TID) | INTRAVENOUS | Status: AC
  Administered 2020-07-29 – 2020-07-31 (×6): 2 g via INTRAVENOUS
  Filled 2020-07-29 (×6): qty 2

## 2020-07-29 MED ORDER — ONDANSETRON HCL 4 MG/2ML IJ SOLN
4.0000 mg | Freq: Three times a day (TID) | INTRAMUSCULAR | Status: DC | PRN
  Administered 2020-08-01 – 2020-08-02 (×2): 4 mg via INTRAVENOUS
  Filled 2020-07-29 (×2): qty 2

## 2020-07-29 MED ORDER — TRAZODONE HCL 100 MG PO TABS
100.0000 mg | ORAL_TABLET | Freq: Every day | ORAL | Status: DC
  Administered 2020-07-29 – 2020-08-01 (×4): 100 mg via ORAL
  Filled 2020-07-29 (×4): qty 1

## 2020-07-29 MED ORDER — VANCOMYCIN HCL 2000 MG/400ML IV SOLN
2000.0000 mg | Freq: Once | INTRAVENOUS | Status: AC
  Administered 2020-07-29: 2000 mg via INTRAVENOUS
  Filled 2020-07-29: qty 400

## 2020-07-29 MED ORDER — SODIUM CHLORIDE 0.9% FLUSH
3.0000 mL | Freq: Two times a day (BID) | INTRAVENOUS | Status: DC
  Administered 2020-07-29 – 2020-08-02 (×7): 3 mL via INTRAVENOUS

## 2020-07-29 MED ORDER — VANCOMYCIN HCL 2000 MG/400ML IV SOLN
2000.0000 mg | INTRAVENOUS | Status: AC
  Administered 2020-07-30 – 2020-07-31 (×2): 2000 mg via INTRAVENOUS
  Filled 2020-07-29 (×2): qty 400

## 2020-07-29 MED ORDER — SODIUM CHLORIDE 0.9 % IV SOLN: INTRAVENOUS | Status: DC

## 2020-07-29 MED ORDER — HYDROCODONE-ACETAMINOPHEN 5-325 MG PO TABS
1.0000 | ORAL_TABLET | Freq: Four times a day (QID) | ORAL | Status: DC | PRN
  Administered 2020-07-29 – 2020-08-02 (×7): 1 via ORAL
  Filled 2020-07-29 (×8): qty 1

## 2020-07-29 MED ORDER — ADULT MULTIVITAMIN W/MINERALS CH
1.0000 | ORAL_TABLET | Freq: Every day | ORAL | Status: DC
  Administered 2020-07-29 – 2020-08-02 (×5): 1 via ORAL
  Filled 2020-07-29 (×5): qty 1

## 2020-07-29 MED ORDER — INSULIN ASPART 100 UNIT/ML IJ SOLN
0.0000 [IU] | Freq: Three times a day (TID) | INTRAMUSCULAR | Status: DC
  Administered 2020-07-29 – 2020-07-30 (×2): 3 [IU] via SUBCUTANEOUS

## 2020-07-29 MED ORDER — SODIUM CHLORIDE 0.9 % IV SOLN
2.0000 g | Freq: Once | INTRAVENOUS | Status: AC
  Administered 2020-07-29: 2 g via INTRAVENOUS
  Filled 2020-07-29: qty 2

## 2020-07-29 NOTE — Progress Notes (Signed)
NEW ADMISSION NOTE New Admission Note:    Arrival Method: [place] Mental Orientation: A&O x4 Telemetry: 5M01 Assessment: Completed Skin: Left great toe wound (see wound assessment) IV: Clean, dry, intact Pain: 8/10 (see pain assessment) Tubes: none Safety Measures: Safety Fall Prevention Plan has been given, discussed and signed Admission: Completed [facility] Orientation: Patient has been orientated to the room, unit and staff.  Family: none present   Orders have been reviewed and implemented. Will continue to monitor the patient. Call light has been placed within reach and bed alarm has been activated.    Leanna Battles, RN, CMSRN, PCCN

## 2020-07-29 NOTE — ED Notes (Signed)
Patient given sandwich and ginger ale per MD.

## 2020-07-29 NOTE — ED Notes (Signed)
ED Provider at bedside. 

## 2020-07-29 NOTE — Hospital Course (Addendum)
Mentions having difficulty with sleep overnight due to worsening nausea, vomiting, diarrhea. Denies any appetite. Pain is present but bearable. Discussed his GI sxs possibly being due to side effects of antibiotics. Mentions having episode of confusion and thinking he was in a [place] faciliy.

## 2020-07-29 NOTE — ED Provider Notes (Signed)
[facility] EMERGENCY DEPARTMENT Provider Note   CSN: [id number] Arrival date & time: 07/29/20  1055     History No chief complaint on file.   Randall Coleman is a 70 y.o. male.  Patient with history of diabetes on metformin, DVT on Eliquis, hypertension, followed at [place] in [place], history of poor memory per his report --presents the emergency department for evaluation of worsening toe pain and drainage.  Patient states that about 3 days ago he had increasing pain in his left foot and redness which is now streaking up his leg.  He denies fever or chills, nausea or vomiting.  He denies injury or trauma.  He states that a week ago his toe "looked fine".  Pain in the foot moves up the leg.  He does endorse neuropathy at baseline.  No chest pains or shortness of breath.  Denies history of peripheral arterial disease.  He is a smoker. The onset of this condition was acute. The course is worsening. Aggravating factors: none. Alleviating factors: none.          Past Medical History:  Diagnosis Date  . Diabetes mellitus without complication ([place])   . DVT (deep venous thrombosis) ([place])   . Hypertension     Patient Active Problem List   Diagnosis Date Noted  . Major depressive disorder 12/15/2018  . Anxiety 12/15/2018  . Foot fracture, right 12/11/2018  . Foreign body in lung 12/10/2018  . Syncope and collapse 12/10/2018  . Foot injury, right, initial encounter 12/10/2018  . Essential hypertension 12/10/2018  . History of DVT of lower extremity 12/10/2018  . Type 2 diabetes mellitus without complication ([place]) 81/82/9937  . Unspecified atrial fibrillation ([place]) 12/10/2018    Past Surgical History:  Procedure Laterality Date  . HERNIA REPAIR    . HERNIA REPAIR    . IR CHEST FLUORO  12/13/2018       No family history on file.  Social History   Tobacco Use  . Smoking status: Current Every Day Smoker    Packs/day: 1.00    Years: 54.00    Pack years:  54.00  . Smokeless tobacco: Never Used  Substance Use Topics  . Alcohol use: Not Currently    Comment: h/o heavy use, quit about 10 years ago  . Drug use: Never    Comment: used when he was younger    Home Medications Prior to Admission medications   Medication Sig Start Date End Date Taking? Authorizing Provider  apixaban (ELIQUIS) 5 MG TABS tablet Take 5 mg by mouth 2 (two) times daily.    [provider]  ARIPiprazole (ABILIFY) 20 MG tablet Take 0.5 tablets (10 mg total) by mouth daily. 12/15/18   Dhungel, [name], MD  aspirin EC 81 MG tablet Take 81 mg by mouth daily.    [provider]  benztropine (COGENTIN) 1 MG tablet Take 1 mg by mouth daily.    [provider]  buPROPion (WELLBUTRIN XL) 150 MG 24 hr tablet Take 150 mg by mouth daily.    [provider]  busPIRone (BUSPAR) 15 MG tablet Take 1 tablet (15 mg total) by mouth 2 (two) times daily. 12/15/18   Dhungel, Nishant, MD  gabapentin (NEURONTIN) 300 MG capsule Take 300 mg by mouth 3 (three) times daily. 03/15/17   [provider]  glipiZIDE (GLUCOTROL) 5 MG tablet Take 2.5 mg by mouth 2 (two) times daily.    [provider]  insulin glargine (LANTUS) 100 UNIT/ML injection  Inject 0.16 mLs (16 Units total) into the skin at bedtime. 12/15/18   Dhungel, Nishant, MD  lisinopril (ZESTRIL) 10 MG tablet Take 1 tablet (10 mg total) by mouth daily. 12/15/18   Dhungel, [name], MD  metFORMIN (GLUMETZA) 1000 MG (MOD) 24 hr tablet Take 1,000 mg by mouth 2 (two) times daily.    [provider]  Multiple Vitamin (MULTI-VITAMIN) tablet Take 1 tablet by mouth daily.    [provider]  nicotine (NICODERM CQ - DOSED IN MG/24 HOURS) 21 mg/24hr patch Place 1 patch (21 mg total) onto the skin daily. 12/15/18   Dhungel, [name], MD  omeprazole (PRILOSEC) 20 MG capsule Take 20 mg by mouth 2 (two) times daily.    [provider]  propranolol (INDERAL) 10 MG tablet Take 10 mg  by mouth daily.    [provider]  traZODone (DESYREL) 100 MG tablet Take 1 tablet (100 mg total) by mouth at bedtime. 12/15/18   Dhungel, [name], MD    Allergies    Patient has no known allergies.  Review of Systems   Review of Systems  Constitutional: Negative for fever.  HENT: Negative for rhinorrhea and sore throat.   Eyes: Negative for redness.  Respiratory: Negative for cough.   Cardiovascular: Negative for chest pain.  Gastrointestinal: Negative for abdominal pain, diarrhea, nausea and vomiting.  Genitourinary: Negative for dysuria and hematuria.  Musculoskeletal: Positive for arthralgias and myalgias.  Skin: Positive for color change and wound. Negative for rash.  Neurological: Positive for numbness (chronic). Negative for headaches.    Physical Exam Updated Vital Signs BP 133/82 (BP Location: Left Arm)   Pulse 78   Temp 98.8 F (37.1 C) (Oral)   Resp 17   SpO2 99%   Physical Exam Vitals and nursing note reviewed.  Constitutional:      Appearance: He is well-developed.  HENT:     Head: Normocephalic and atraumatic.  Eyes:     General:        Right eye: No discharge.        Left eye: No discharge.     Conjunctiva/sclera: Conjunctivae normal.  Cardiovascular:     Rate and Rhythm: Normal rate and regular rhythm.     Heart sounds: Normal heart sounds.  Pulmonary:     Effort: Pulmonary effort is normal.     Breath sounds: Normal breath sounds.  Abdominal:     Palpations: Abdomen is soft.     Tenderness: There is no abdominal tenderness.  Musculoskeletal:     Cervical back: Normal range of motion and neck supple.  Skin:    General: Skin is warm and dry.  Neurological:     Mental Status: He is alert.         ED Results / Procedures / Treatments   Labs (all labs ordered are listed, but only abnormal results are displayed) Labs Reviewed  BASIC METABOLIC PANEL - Abnormal; Notable for the following components:      Result Value   Sodium 122  (*)    Chloride 91 (*)    Glucose, Bld 141 (*)    Calcium 8.7 (*)    All other components within normal limits  CBC WITH DIFFERENTIAL/PLATELET - Abnormal; Notable for the following components:   Hemoglobin 12.5 (*)    HCT 36.0 (*)    Lymphs Abs 0.6 (*)    All other components within normal limits  CULTURE, BLOOD (ROUTINE X 2)  CULTURE, BLOOD (ROUTINE X 2)  RESP PANEL  BY RT-PCR (FLU A&B, COVID) ARPGX2  LACTIC ACID, PLASMA  LACTIC ACID, PLASMA    EKG None  Radiology DG Foot Complete Left  Result Date: 07/29/2020 CLINICAL DATA:  Foot infection EXAM: LEFT FOOT - COMPLETE 3+ VIEW COMPARISON:  None FINDINGS: Bony destructive findings of the distal phalanx great toe especially involving the tuft, with abnormal soft tissue swelling and some gas in the soft tissues in this vicinity indicating osteomyelitis and regional soft tissue infection. No other bony destructive findings are identified. No malalignment at the Lisfranc joint. Plantar calcaneal spur noted. Mild dorsal spurring at the talonavicular articulation. IMPRESSION: 1. Bony destructive findings in the distal phalanx great toe with local signs of soft tissue infection. The appearance is compatible with active osteomyelitis of the great toe. 2. Plantar calcaneal spur. 3. Mild dorsal spurring at the talonavicular articulation. Electronically Signed   By: Van Clines M.D.   On: 07/29/2020 12:36    Procedures Procedures   Medications Ordered in ED Medications - No data to display  ED Course  I have reviewed the triage vital signs and the nursing notes.  Pertinent labs & imaging results that were available during my care of the patient were reviewed by me and considered in my medical decision making (see chart for details).  Patient seen and examined. Work-up initiated. Medications ordered.   Vital signs reviewed and are as follows: BP 119/67   Pulse 73   Temp 98.8 F (37.1 C) (Oral)   Resp 16   Ht 6\' 2"  (1.88 m)   Wt  95.3 kg   SpO2 100%   BMI 26.96 kg/m   1:29 PM discussed with Hilbert Odor, PA-C who will consult.  Agrees with initiation of antibiotics.  Labs show hyponatremia, patient will be started on IV normal saline.  Discussed with unassigned medicine, internal medicine teaching service, for admission.    MDM Rules/Calculators/A&P                          Admit.   Final Clinical Impression(s) / ED Diagnoses Final diagnoses:  Diabetic foot infection ([place])  Hyponatremia  Osteomyelitis of great toe of left foot [facility])    Rx / DC Orders ED Discharge Orders    None       Carlisle Cater, PA-C 07/29/20 [place], MD 08/02/20 0045

## 2020-07-29 NOTE — Consult Note (Signed)
Reason for Consult:Great toe osteo Referring Physician: Tyron Russell Time called: [phone] Time at bedside: [name] is an 70 y.o. male.  HPI: Randall Coleman began to have left great toe pain on Friday or Saturday. This quickly progressed to swelling and ulceration over the next couple of days. He went to UC today and was directed to come to the ED and orthopedic surgery was consulted. He denies antecedent event or fevers, chills, sweats, or N/V. No prior e/o like this but he has had cellulitis of his feet before.  Past Medical History:  Diagnosis Date  . Diabetes mellitus without complication ([place])   . DVT (deep venous thrombosis) ([place])   . Hypertension     Past Surgical History:  Procedure Laterality Date  . HERNIA REPAIR    . HERNIA REPAIR    . IR CHEST FLUORO  12/13/2018    No family history on file.  Social History:  reports that he has been smoking. He has a 54.00 pack-year smoking history. He has never used smokeless tobacco. He reports previous alcohol use. He reports that he does not use drugs.  Allergies: No Known Allergies  Medications: I have reviewed the patient's current medications.  Results for orders placed or performed during the hospital encounter of 07/29/20 (from the past 48 hour(s))  Basic metabolic panel     Status: Abnormal   Collection Time: 07/29/20 11:49 AM  Result Value Ref Range   Sodium 122 (L) 135 - 145 mmol/L   Potassium 4.4 3.5 - 5.1 mmol/L   Chloride 91 (L) 98 - 111 mmol/L   CO2 23 22 - 32 mmol/L   Glucose, Bld 141 (H) 70 - 99 mg/dL    Comment: Glucose reference range applies only to samples taken after fasting for at least 8 hours.   BUN 11 8 - 23 mg/dL   Creatinine, Ser 1.17 0.61 - 1.24 mg/dL   Calcium 8.7 (L) 8.9 - 10.3 mg/dL   GFR, Estimated >60 >60 mL/min    Comment: (NOTE) Calculated using the CKD-EPI Creatinine Equation (2021)    Anion gap 8 5 - 15    Comment: Performed at [facility] [address], [place],  [place] [address]  CBC with Differential     Status: Abnormal   Collection Time: 07/29/20 11:49 AM  Result Value Ref Range   WBC 8.0 4.0 - 10.5 K/uL   RBC 4.45 4.22 - 5.81 MIL/uL   Hemoglobin 12.5 (L) 13.0 - 17.0 g/dL   HCT 36.0 (L) 39.0 - 52.0 %   MCV 80.9 80.0 - 100.0 fL   MCH 28.1 26.0 - 34.0 pg   MCHC 34.7 30.0 - 36.0 g/dL   RDW 13.2 11.5 - 15.5 %   Platelets 257 150 - 400 K/uL   nRBC 0.0 0.0 - 0.2 %   Neutrophils Relative % 81 %   Neutro Abs 6.5 1.7 - 7.7 K/uL   Lymphocytes Relative 8 %   Lymphs Abs 0.6 (L) 0.7 - 4.0 K/uL   Monocytes Relative 8 %   Monocytes Absolute 0.6 0.1 - 1.0 K/uL   Eosinophils Relative 2 %   Eosinophils Absolute 0.1 0.0 - 0.5 K/uL   Basophils Relative 0 %   Basophils Absolute 0.0 0.0 - 0.1 K/uL   Immature Granulocytes 1 %   Abs Immature Granulocytes 0.06 0.00 - 0.07 K/uL    Comment: Performed at [facility] [address], [place], [place] [address]  Lactic acid,  plasma     Status: None   Collection Time: 07/29/20 11:50 AM  Result Value Ref Range   Lactic Acid, Venous 0.7 0.5 - 1.9 mmol/L    Comment: Performed at [facility], [place] [address] [place], [place] [address]  Lactic acid, plasma     Status: None   Collection Time: 07/29/20 12:47 PM  Result Value Ref Range   Lactic Acid, Venous 0.7 0.5 - 1.9 mmol/L    Comment: Performed at [facility] [address], [place], [place] [address]    DG Foot Complete Left  Result Date: 07/29/2020 CLINICAL DATA:  Foot infection EXAM: LEFT FOOT - COMPLETE 3+ VIEW COMPARISON:  None FINDINGS: Bony destructive findings of the distal phalanx great toe especially involving the tuft, with abnormal soft tissue swelling and some gas in the soft tissues in this vicinity indicating osteomyelitis and regional soft tissue infection. No other bony destructive findings are identified. No malalignment at the Lisfranc joint. Plantar calcaneal spur noted. Mild dorsal spurring at the talonavicular articulation.  IMPRESSION: 1. Bony destructive findings in the distal phalanx great toe with local signs of soft tissue infection. The appearance is compatible with active osteomyelitis of the great toe. 2. Plantar calcaneal spur. 3. Mild dorsal spurring at the talonavicular articulation. Electronically Signed   By: Van Clines M.D.   On: 07/29/2020 12:36    Review of Systems  Constitutional: Negative for chills, diaphoresis and fever.  HENT: Negative for ear discharge, ear pain, hearing loss and tinnitus.   Eyes: Negative for photophobia and pain.  Respiratory: Negative for cough and shortness of breath.   Cardiovascular: Negative for chest pain.  Gastrointestinal: Negative for abdominal pain, nausea and vomiting.  Genitourinary: Negative for dysuria, flank pain, frequency and urgency.  Musculoskeletal: Positive for arthralgias (Left foot). Negative for back pain, myalgias and neck pain.  Neurological: Negative for dizziness and headaches.  Hematological: Does not bruise/bleed easily.  Psychiatric/Behavioral: The patient is not nervous/anxious.    Blood pressure 119/67, pulse 73, temperature 98.8 F (37.1 C), temperature source Oral, resp. rate 16, height 6\' 2"  (1.88 m), weight 95.3 kg, SpO2 100 %. Physical Exam Constitutional:      General: He is not in acute distress.    Appearance: He is well-developed. He is not diaphoretic.  HENT:     Head: Normocephalic and atraumatic.  Eyes:     General: No scleral icterus.       Right eye: No discharge.        Left eye: No discharge.     Conjunctiva/sclera: Conjunctivae normal.  Cardiovascular:     Rate and Rhythm: Normal rate and regular rhythm.  Pulmonary:     Effort: Pulmonary effort is normal. No respiratory distress.  Musculoskeletal:     Cervical back: Normal range of motion.  Feet:     Comments: Left foot: Fusiform edema great toe with erythema of forefoot and streaking to ankle. Ulceration with foul odor great toe. Sensation intact,  absent DP/PT pulses. Skin:    General: Skin is warm and dry.  Neurological:     Mental Status: He is alert.  Psychiatric:        Behavior: Behavior normal.     Assessment/Plan: Left great toe osteo -- Will get ABI's. Dr. Sharol Given to evaluate either today or in AM. Suspect surgery Wednesday.    Lisette Abu, PA-C Orthopedic Surgery [phone] 07/29/2020, 1:30 PM

## 2020-07-29 NOTE — Plan of Care (Signed)
  Problem: Education: Goal: Knowledge of General Education information will improve Description: Including pain rating scale, medication(s)/side effects and non-pharmacologic comfort measures Outcome: Progressing   Problem: Health Behavior/Discharge Planning: Goal: Ability to manage health-related needs will improve Outcome: Progressing   Problem: Clinical Measurements: Goal: Ability to maintain clinical measurements within normal limits will improve Outcome: Progressing Goal: Will remain free from infection Outcome: Progressing Goal: Diagnostic test results will improve Outcome: Progressing Goal: Respiratory complications will improve Outcome: Progressing Goal: Cardiovascular complication will be avoided Outcome: Progressing   Problem: Activity: Goal: Risk for activity intolerance will decrease Outcome: Progressing   Problem: Safety: Goal: Ability to remain free from injury will improve Outcome: Progressing   Problem: Skin Integrity: Goal: Risk for impaired skin integrity will decrease Outcome: Progressing   Problem: Pain Managment: Goal: General experience of comfort will improve Outcome: Progressing

## 2020-07-29 NOTE — ED Provider Notes (Signed)
Emergency Medicine Provider Triage Evaluation Note  Randall Coleman , a 70 y.o. male  was evaluated in triage.  Pt complains of worsening left toe and foot pain beginning a few days ago.  Review of Systems  Positive: Foot pain, swelling Negative: Fever, trauma  Physical Exam  BP 133/82 (BP Location: Left Arm)   Pulse 78   Temp 98.8 F (37.1 C) (Oral)   Resp 17   SpO2 99%  Gen:   Awake, no distress   Resp:  Normal effort  MSK:   Moves extremities without difficulty  Other:            Medical Decision Making  Medically screening exam initiated at 11:48 AM.  Appropriate orders placed.  Bubba Vanbenschoten was informed that the remainder of the evaluation will be completed by another provider, this initial triage assessment does not replace that evaluation, and the importance of remaining in the ED until their evaluation is complete.     Lorayne Bender, PA-C 07/29/20 1152    Isla Pence, MD 07/29/20 1302

## 2020-07-29 NOTE — ED Notes (Signed)
Pat to floor with transport on tele

## 2020-07-29 NOTE — H&P (Addendum)
Date: 07/29/2020               Patient Name:  Randall Coleman MRN: [id number]  DOB: 1950-12-26 Age / Sex: 70 y.o., male   PCP: Pcp, No              Medical Service: Internal Medicine Teaching Service              Attending Physician: Dr. [name], MD    First Contact: Oswaldo Conroy, MS4 Pager: [phone]  Second Contact: Dr. Jose Persia Pager: [phone]       After Hours (After 5p/  First Contact Pager: [phone]  weekends / holidays): Second Contact Pager: [phone]   Chief Complaint: foot pain   History of Present Illness:  Randall Coleman is a 70 year old gentleman with a past medical history of DM2, Afib on Eliquis, neuropathy, and DVT who presents to the ED with 4 days of worsening left great toe pain with swelling and purulent drainage.   He reports pain and discoloration in L great toe began on Friday (5/27). The pain is sharp and steady with increasing intensity over time. He noted purulent drainage from the toe last night. This morning, he noted a stripe of red discoloration from the toe up the front of his shin. His left foot and ankle are swollen. He tried to elevate his foot with little benefit. Is able to bear weight with great pain. He was not aware of any wound or bite to the foot prior to Friday, but notes a longstanding history of decreased LLE sensation secondary to neuropathy and does not check his feet regularly. He typically wears compression socks due to history of DVT in LLE.   He denies fevers or chills, but notes decreased appetite with poor po intake, stomach upset, diarrhea, and headache over the past two days. No hematochezia, emesis, dysuria, dizziness, new sensory disturbance, chest pain, SOB, or unexplained weight loss. He has been able to drink fluids without difficulty.   Patient is a somewhat poor historian who describes his memory as "terrible." He says that he has about 8 medications that he takes but is unsure of their names, thinks that he remembers to  take them 95% of the time.   ED course:  Patient was afebrile with stable vital signs (T98.8; BP120s-30s/60s-80s; HR70-78; RR17-18; SpO299+ on RA). L great toe was grossly infected with purulent drainage and significant erythema and swelling of L foot into lower extremity. L foot Xray showed bony destructive findings in distal phalanx consistent with osteomyelitis of L great toe. IV Vanc and Zosyn were initiated. Evaluated by ortho who anticipate surgery tomorrow. CBC showed WBC 8, Hgb 12.5. He was found to be hyponatremic with Na 122 and Cl 91 on BMP and received 572mL bolus NS.   Meds:  Current Meds  Medication Sig  . apixaban (ELIQUIS) 5 MG TABS tablet Take 5 mg by mouth 2 (two) times daily.  . ARIPiprazole (ABILIFY) 20 MG tablet Take 0.5 tablets (10 mg total) by mouth daily.  [name] aspirin EC 81 MG tablet Take 81 mg by mouth daily.  . benztropine (COGENTIN) 1 MG tablet Take 1 mg by mouth daily.  [name] buPROPion (WELLBUTRIN XL) 150 MG 24 hr tablet Take 150 mg by mouth daily.  . Cyanocobalamin (VITAMIN B 12 PO) Take 1 tablet by mouth daily.  [name] gabapentin (NEURONTIN) 300 MG capsule Take 300 mg by mouth 3 (three) times daily.  [name] glipiZIDE (GLUCOTROL) 5 MG tablet Take 2.5 mg by  mouth 2 (two) times daily.  [name] lisinopril (ZESTRIL) 10 MG tablet Take 1 tablet (10 mg total) by mouth daily.  . metFORMIN (GLUMETZA) 1000 MG (MOD) 24 hr tablet Take 1,000 mg by mouth 2 (two) times daily.  . Multiple Vitamin (MULTI-VITAMIN) tablet Take 1 tablet by mouth daily.  . nicotine (NICODERM CQ - DOSED IN MG/24 HOURS) 21 mg/24hr patch Place 1 patch (21 mg total) onto the skin daily.  [name] omeprazole (PRILOSEC) 20 MG capsule Take 20 mg by mouth 2 (two) times daily.  . propranolol (INDERAL) 10 MG tablet Take 10 mg by mouth daily.  . traZODone (DESYREL) 100 MG tablet Take 1 tablet (100 mg total) by mouth at bedtime.   Allergies: Allergies as of 07/29/2020  . (No Known Allergies)   Past Medical History:  Diagnosis Date  .  Diabetes mellitus without complication ([place])   . DVT (deep venous thrombosis) ([place])   . Hypertension    Family History:  No family history on file.  Social History:  He lives alone and performs all ADLs independently. Ambulates without assistance. He is not working due to his neuropathy and receives SSDI. Primary healthcare is through the [place].  Tobacco - current 1ppd smoker, down from 3ppd in the past. Smoking since age 70 (16+ pack/year) Denies alcohol use. No drug use.   Review of Systems: A complete ROS was negative except as per HPI.   Physical Exam: Blood pressure 124/73, pulse 73, temperature 98.8 F (37.1 C), temperature source Oral, resp. rate 18, height 6\' 2"  (1.88 m), weight 95.3 kg, SpO2 100 %.  General: Older gentleman resting in bed in no acute distress.  HENT: Normocephalic, atraumatic. Mucus membranes moist. Using corrective lenses.  CV: Regular rate and rhythm. S1/S2 present with no murmurs, rubs, or gallops appreciated. RLE cool to touch with diminished pedal pulse. LLE hot and erythematous.  Pulm: Lungs CTAB. No wheezes or crackles. No increased work of breathing on room air.  Abd: Nontender, nondistended. No guarding or rebound. Normoactive bowel sounds.  Derm: Skin is dry. LLE with hyperpigmentation to mid-shin, excepting linear streak of erythema from L great toe along L anterior shin. L great toe is grossly swollen, bloody, with purulent drainage surrounding nail and yellow discoloration. L foot is hot to touch, tender, edematous with tight skin near toes and medial forefoot. See photos below: Neuro: Alert and grossly oriented. Moves all extremities spontaneously. LLE sensation decreased to light touch below knee.  Psych: Appropriate affect. Thought process linear, logical, and goal-oriented.      XR L foot, 3 view: IMPRESSION: 1. Bony destructive findings in the distal phalanx great toe with local signs of soft tissue infection. The appearance is  compatible with active osteomyelitis of the great toe. 2. Plantar calcaneal spur. 3. Mild dorsal spurring at the talonavicular articulation.  EKG: personally reviewed my interpretation is: sinus rhythm with PACs and prolonged PR interval  Assessment & Plan by Problem: Active Problems:   Acute osteomyelitis [facility])  Randall Coleman is a 70 year old gentleman with a past medical history of DM2, neuropathy, DVT, htn, and Afib on Eliquis who presented to the ED with four days of worsening L great toe pain, swelling, and erythema.   #Acute osteomyelitis Patient presented with infected L great toe with purulent drainage, erythema, and swelling into LLE worsening over the past several days. L foot Xray consistent with osteomyelitis of great toe. Ortho has been consulted and anticipates surgery tomorrow. Will continue IV antibiotics and pain control for  now. Given his history of LLE neuropathy and DM2 and no apparent trauma to foot, suspect progression of diabetic foot ulcer.  - Continue IV cefepime 200g q8hrs and IV vancomycin 2000mg  daily  - Blood cultures pending  - Appreciate ortho consult and recommendations. They anticipate surgery on 6/1.  - NPO after midnight  - F/u ABI [place] per ortho - Continue to follow CBC - Norco 5-325mg  po q6hrs prn pain  - Dilaudid 0.5mg  IV q4hrs prn pain   #Hypotonic hyponatremia Found to have Na 122 on BMP with serum osmolality of 262. He received IV NS before the urine sample could be taken; urine sodium <10 with urine osmolality of 149 would otherwise indicate hypovolemia. He appears euvolemic on exam but has had decreased po intake over the past two days due to stomach upset and anorexia.  - IV NS at 159mL/hr - BMP q6hrs  - Goal Na increase no more than 6-8 units in 24/hours - Continue cardiac monitoring  - K 4.4, goal K>4   #DM2 #Neuropathy History of DM2 on metformin at home. Likely contributed to development of now infected toe ulcer. - Hold home metformin  -  Sliding scale insulin with moderate correction coverage - CBG before meals and at bedtime with goal glucose 140-180 - Continue home gabapentin 300mg  TID - Hgb A1c pending  #Afib #Hx of DVT CHA2DS2-VASc score 5 (7.2% stroke risk/year). Sinus rhythm on EKG today. - Hold home Eliquis given anticipated surgery  - Hold home ASA 81mg  for surgery - SCDs for VTE ppx   #Hx of hypertension Mildly elevated pressures with one episode of SBP to 151 in the ED.  - If high pressures persist, will consider restarting home lisinopril 10mg  daily  #Tobacco use disorder Current 1ppd smoker with total 100+ pack/year history.  - Nicoderm transdermal patch 21mg  daily  #Normocytic anemia, mild Hgb 12.5 with MCV 80.9. Per records, historical baseline Hgb 13.4-14.0  - Continue to monitor CBC  #Hx of mood disorder - Continue home Abilify 10mg  daily - Continue home benztropine 1mg  daily  - Continue Trazodone 100mg  nightly for sleep   Dispo: Admit patient to Inpatient with expected length of stay greater than 2 midnights.  Signed: Theodosia Blender, Medical Student 07/29/2020, 3:03 PM  Pager: [phone]   Attestation for Student Documentation:  I personally was present and performed or re-performed the history, physical exam and medical decision-making activities of this service and have verified that the service and findings are accurately documented in the student's note.  Jose Persia, MD 07/29/2020, 5:45 PM

## 2020-07-29 NOTE — ED Triage Notes (Addendum)
Significant infection to L great toe that extends with redness and swelling into foot.  Pain radiates up L leg.  Pt denies injury and states that it started on Friday or Saturday.  States toe was normal prior to Friday.  Denies fever, chills, nausea, and vomiting.

## 2020-07-29 NOTE — Progress Notes (Signed)
Pharmacy Antibiotic Note  Randall Coleman is a 70 y.o. male admitted on 07/29/2020 with toe infection  Pharmacy has been consulted for vancomycin and cefepime dosing. Scr 1.17. Last Scr 0.96 in 11/2018.  Plan: Cefepime 2g x1 followed by cefepime 2g q8h  Vancomycin 2000mg  x1 followed by 2000mg  q24h (eAUC 465 using Scr 1.17) Monitor renal function, cultures, and clinical progression  Height: 6\' 2"  (188 cm) Weight: 95.3 kg (210 lb) IBW/kg (Calculated) : 82.2  Temp (24hrs), Avg:98.8 F (37.1 C), Min:98.8 F (37.1 C), Max:98.8 F (37.1 C)  Recent Labs  Lab 07/29/20 1149 07/29/20 1150  WBC 8.0  --   CREATININE 1.17  --   LATICACIDVEN  --  0.7    Estimated Creatinine Clearance: 70.3 mL/min (by C-G formula based on SCr of 1.17 mg/dL).    No Known Allergies  Antimicrobials this admission: Vancomycin 5/31 >>  Cefepime 5/31 >>  Dose adjustments this admission: N/a  Microbiology results: 5/31 bcx:   Thank you for allowing pharmacy to be a part of this patient's care.  Cristela Felt, PharmD Clinical Pharmacist  07/29/2020 1:18 PM

## 2020-07-29 NOTE — ED Notes (Signed)
Surgery Provider at bedside. 

## 2020-07-30 ENCOUNTER — Inpatient Hospital Stay (HOSPITAL_COMMUNITY): Payer: No Typology Code available for payment source | Admitting: Anesthesiology

## 2020-07-30 ENCOUNTER — Inpatient Hospital Stay (HOSPITAL_COMMUNITY): Payer: No Typology Code available for payment source

## 2020-07-30 ENCOUNTER — Encounter (HOSPITAL_COMMUNITY)
Admission: EM | Disposition: A | Discharge status: Home Health | Payer: Self-pay | Source: Home / Self Care | Attending: Internal Medicine

## 2020-07-30 ENCOUNTER — Encounter (HOSPITAL_COMMUNITY): Payer: Self-pay | Admitting: Internal Medicine

## 2020-07-30 DIAGNOSIS — Z01810 Encounter for preprocedural cardiovascular examination: Secondary | ICD-10-CM

## 2020-07-30 DIAGNOSIS — M869 Osteomyelitis, unspecified: Secondary | ICD-10-CM | POA: Diagnosis not present

## 2020-07-30 DIAGNOSIS — L02612 Cutaneous abscess of left foot: Secondary | ICD-10-CM | POA: Diagnosis not present

## 2020-07-30 DIAGNOSIS — L0390 Cellulitis, unspecified: Secondary | ICD-10-CM

## 2020-07-30 HISTORY — PX: AMPUTATION: SHX166

## 2020-07-30 LAB — BASIC METABOLIC PANEL
Anion gap: 7 (ref 5–15)
Anion gap: 7 (ref 5–15)
Anion gap: 7 (ref 5–15)
Anion gap: 6 (ref 5–15)
BUN: 7 mg/dL — ABNORMAL LOW (ref 8–23)
BUN: 8 mg/dL (ref 8–23)
BUN: 6 mg/dL — ABNORMAL LOW (ref 8–23)
BUN: 8 mg/dL (ref 8–23)
CO2: 25 mmol/L (ref 22–32)
CO2: 22 mmol/L (ref 22–32)
CO2: 25 mmol/L (ref 22–32)
CO2: 25 mmol/L (ref 22–32)
Calcium: 8.7 mg/dL — ABNORMAL LOW (ref 8.9–10.3)
Calcium: 8.5 mg/dL — ABNORMAL LOW (ref 8.9–10.3)
Calcium: 8.8 mg/dL — ABNORMAL LOW (ref 8.9–10.3)
Calcium: 8.2 mg/dL — ABNORMAL LOW (ref 8.9–10.3)
Chloride: 99 mmol/L (ref 98–111)
Chloride: 102 mmol/L (ref 98–111)
Chloride: 101 mmol/L (ref 98–111)
Chloride: 97 mmol/L — ABNORMAL LOW (ref 98–111)
Creatinine, Ser: 0.86 mg/dL (ref 0.61–1.24)
Creatinine, Ser: 0.74 mg/dL (ref 0.61–1.24)
Creatinine, Ser: 0.71 mg/dL (ref 0.61–1.24)
Creatinine, Ser: 0.97 mg/dL (ref 0.61–1.24)
GFR, Estimated: >60 mL/min (ref >60)
GFR, Estimated: >60 mL/min (ref >60)
GFR, Estimated: >60 mL/min (ref >60)
GFR, Estimated: >60 mL/min (ref >60)
Glucose, Bld: 181 mg/dL — ABNORMAL HIGH (ref 70–99)
Glucose, Bld: 176 mg/dL — ABNORMAL HIGH (ref 70–99)
Glucose, Bld: 157 mg/dL — ABNORMAL HIGH (ref 70–99)
Glucose, Bld: 344 mg/dL — ABNORMAL HIGH (ref 70–99)
Potassium: 4.4 mmol/L (ref 3.5–5.1)
Potassium: 4.4 mmol/L (ref 3.5–5.1)
Potassium: 4.5 mmol/L (ref 3.5–5.1)
Potassium: 5.2 mmol/L — ABNORMAL HIGH (ref 3.5–5.1)
Sodium: 131 mmol/L — ABNORMAL LOW (ref 135–145)
Sodium: 131 mmol/L — ABNORMAL LOW (ref 135–145)
Sodium: 133 mmol/L — ABNORMAL LOW (ref 135–145)
Sodium: 128 mmol/L — ABNORMAL LOW (ref 135–145)

## 2020-07-30 LAB — GLUCOSE, CAPILLARY
Glucose-Capillary: 184 mg/dL — ABNORMAL HIGH (ref 70–99)
Glucose-Capillary: 181 mg/dL — ABNORMAL HIGH (ref 70–99)
Glucose-Capillary: 136 mg/dL — ABNORMAL HIGH (ref 70–99)
Glucose-Capillary: 125 mg/dL — ABNORMAL HIGH (ref 70–99)
Glucose-Capillary: 147 mg/dL — ABNORMAL HIGH (ref 70–99)
Glucose-Capillary: 346 mg/dL — ABNORMAL HIGH (ref 70–99)
Glucose-Capillary: 307 mg/dL — ABNORMAL HIGH (ref 70–99)

## 2020-07-30 LAB — CBC WITH DIFFERENTIAL/PLATELET
Abs Immature Granulocytes: 0.04 10*3/uL (ref 0.00–0.07)
Basophils Absolute: 0 10*3/uL (ref 0.0–0.1)
Basophils Relative: 0 %
Eosinophils Absolute: 0.1 10*3/uL (ref 0.0–0.5)
Eosinophils Relative: 2 %
HCT: 32.3 % — ABNORMAL LOW (ref 39.0–52.0)
Hemoglobin: 11.4 g/dL — ABNORMAL LOW (ref 13.0–17.0)
Immature Granulocytes: 1 %
Lymphocytes Relative: 14 %
Lymphs Abs: 0.9 10*3/uL (ref 0.7–4.0)
MCH: 28.8 pg (ref 26.0–34.0)
MCHC: 35.3 g/dL (ref 30.0–36.0)
MCV: 81.6 fL (ref 80.0–100.0)
Monocytes Absolute: 0.6 10*3/uL (ref 0.1–1.0)
Monocytes Relative: 10 %
Neutro Abs: 4.7 10*3/uL (ref 1.7–7.7)
Neutrophils Relative %: 73 %
Platelets: 223 10*3/uL (ref 150–400)
RBC: 3.96 MIL/uL — ABNORMAL LOW (ref 4.22–5.81)
RDW: 13.4 % (ref 11.5–15.5)
WBC: 6.4 10*3/uL (ref 4.0–10.5)
nRBC: 0 % (ref 0.0–0.2)

## 2020-07-30 LAB — HEMOGLOBIN A1C
Hgb A1c MFr Bld: 8 % — ABNORMAL HIGH (ref 4.8–5.6)
Mean Plasma Glucose: 183 mg/dL

## 2020-07-30 LAB — SURGICAL PCR SCREEN
MRSA, PCR: NEGATIVE
Staphylococcus aureus: NEGATIVE

## 2020-07-30 SURGERY — AMPUTATION DIGIT
Anesthesia: General | Site: Toe | Laterality: Left

## 2020-07-30 MED ORDER — DEXAMETHASONE SODIUM PHOSPHATE 10 MG/ML IJ SOLN
INTRAMUSCULAR | Status: AC
  Filled 2020-07-30: qty 2

## 2020-07-30 MED ORDER — LACTATED RINGERS IV SOLN: INTRAVENOUS | Status: DC

## 2020-07-30 MED ORDER — DEXAMETHASONE SODIUM PHOSPHATE 10 MG/ML IJ SOLN
INTRAMUSCULAR | Status: AC
  Filled 2020-07-30: qty 1

## 2020-07-30 MED ORDER — FENTANYL CITRATE (PF) 250 MCG/5ML IJ SOLN
INTRAMUSCULAR | Status: AC
  Filled 2020-07-30: qty 5

## 2020-07-30 MED ORDER — ONDANSETRON HCL 4 MG/2ML IJ SOLN
INTRAMUSCULAR | Status: AC
  Filled 2020-07-30: qty 6

## 2020-07-30 MED ORDER — LIDOCAINE 2% (20 MG/ML) 5 ML SYRINGE
INTRAMUSCULAR | Status: AC
  Filled 2020-07-30: qty 5

## 2020-07-30 MED ORDER — ALUM & MAG HYDROXIDE-SIMETH 200-200-20 MG/5ML PO SUSP: 15.0000 mL | ORAL | Status: DC | PRN

## 2020-07-30 MED ORDER — DEXAMETHASONE SODIUM PHOSPHATE 10 MG/ML IJ SOLN
INTRAMUSCULAR | Status: DC | PRN
  Administered 2020-07-30: 10 mg via INTRAVENOUS

## 2020-07-30 MED ORDER — FENTANYL CITRATE (PF) 100 MCG/2ML IJ SOLN
25.0000 ug | INTRAMUSCULAR | Status: DC | PRN
  Administered 2020-07-30 (×2): 50 ug via INTRAVENOUS

## 2020-07-30 MED ORDER — PHENYLEPHRINE 40 MCG/ML (10ML) SYRINGE FOR IV PUSH (FOR BLOOD PRESSURE SUPPORT)
PREFILLED_SYRINGE | INTRAVENOUS | Status: AC
  Filled 2020-07-30: qty 10

## 2020-07-30 MED ORDER — EPINEPHRINE 1 MG/10ML IJ SOSY
PREFILLED_SYRINGE | INTRAMUSCULAR | Status: AC
  Filled 2020-07-30: qty 10

## 2020-07-30 MED ORDER — PHENYLEPHRINE 40 MCG/ML (10ML) SYRINGE FOR IV PUSH (FOR BLOOD PRESSURE SUPPORT)
PREFILLED_SYRINGE | INTRAVENOUS | Status: DC | PRN
  Administered 2020-07-30: 80 ug via INTRAVENOUS
  Administered 2020-07-30: 120 ug via INTRAVENOUS

## 2020-07-30 MED ORDER — SODIUM CHLORIDE 0.9 % IV SOLN
INTRAVENOUS | Status: DC | PRN
  Administered 2020-07-30: 250 mL via INTRAVENOUS

## 2020-07-30 MED ORDER — ASCORBIC ACID 500 MG PO TABS
1000.0000 mg | ORAL_TABLET | Freq: Every day | ORAL | Status: DC
  Administered 2020-07-30 – 2020-08-02 (×4): 1000 mg via ORAL
  Filled 2020-07-30 (×4): qty 2

## 2020-07-30 MED ORDER — INSULIN ASPART 100 UNIT/ML IJ SOLN
6.0000 [IU] | Freq: Once | INTRAMUSCULAR | Status: AC
  Administered 2020-07-30: 6 [IU] via SUBCUTANEOUS

## 2020-07-30 MED ORDER — INSULIN ASPART 100 UNIT/ML IJ SOLN
0.0000 [IU] | Freq: Three times a day (TID) | INTRAMUSCULAR | Status: DC
  Administered 2020-07-31: 4 [IU] via SUBCUTANEOUS
  Administered 2020-07-31: 11 [IU] via SUBCUTANEOUS
  Administered 2020-07-31: 7 [IU] via SUBCUTANEOUS
  Administered 2020-08-01: 4 [IU] via SUBCUTANEOUS
  Administered 2020-08-01 – 2020-08-02 (×3): 7 [IU] via SUBCUTANEOUS
  Administered 2020-08-02: 4 [IU] via SUBCUTANEOUS

## 2020-07-30 MED ORDER — DEXTROSE 5 % IV BOLUS
500.0000 mL | Freq: Once | INTRAVENOUS | Status: AC
  Administered 2020-07-30: 500 mL via INTRAVENOUS

## 2020-07-30 MED ORDER — CEFAZOLIN SODIUM-DEXTROSE 2-3 GM-%(50ML) IV SOLR
INTRAVENOUS | Status: DC | PRN
  Administered 2020-07-30: 2 g via INTRAVENOUS

## 2020-07-30 MED ORDER — DEXTROSE 5 % IV BOLUS: 1000.0000 mL | Freq: Once | INTRAVENOUS | Status: DC

## 2020-07-30 MED ORDER — 0.9 % SODIUM CHLORIDE (POUR BTL) OPTIME
TOPICAL | Status: DC | PRN
  Administered 2020-07-30: 1000 mL

## 2020-07-30 MED ORDER — PHENOL 1.4 % MT LIQD: 1.0000 | OROMUCOSAL | Status: DC | PRN

## 2020-07-30 MED ORDER — FENTANYL CITRATE (PF) 100 MCG/2ML IJ SOLN
INTRAMUSCULAR | Status: AC
  Filled 2020-07-30: qty 2

## 2020-07-30 MED ORDER — PROPOFOL 10 MG/ML IV BOLUS
INTRAVENOUS | Status: AC
  Filled 2020-07-30: qty 20

## 2020-07-30 MED ORDER — LIDOCAINE 2% (20 MG/ML) 5 ML SYRINGE
INTRAMUSCULAR | Status: DC | PRN
  Administered 2020-07-30: 80 mg via INTRAVENOUS

## 2020-07-30 MED ORDER — PROPOFOL 10 MG/ML IV BOLUS
INTRAVENOUS | Status: DC | PRN
  Administered 2020-07-30: 60 mg via INTRAVENOUS
  Administered 2020-07-30: 120 mg via INTRAVENOUS

## 2020-07-30 MED ORDER — SODIUM CHLORIDE 0.9 % IV SOLN: INTRAVENOUS | Status: DC

## 2020-07-30 MED ORDER — ONDANSETRON HCL 4 MG/2ML IJ SOLN
INTRAMUSCULAR | Status: DC | PRN
  Administered 2020-07-30: 4 mg via INTRAVENOUS

## 2020-07-30 MED ORDER — METRONIDAZOLE 500 MG/100ML IV SOLN
500.0000 mg | Freq: Three times a day (TID) | INTRAVENOUS | Status: AC
  Administered 2020-07-30 – 2020-07-31 (×4): 500 mg via INTRAVENOUS
  Filled 2020-07-30 (×4): qty 100

## 2020-07-30 MED ORDER — ONDANSETRON HCL 4 MG/2ML IJ SOLN
INTRAMUSCULAR | Status: AC
  Filled 2020-07-30: qty 2

## 2020-07-30 MED ORDER — ZINC SULFATE 220 (50 ZN) MG PO CAPS
220.0000 mg | ORAL_CAPSULE | Freq: Every day | ORAL | Status: DC
  Administered 2020-07-30 – 2020-08-02 (×4): 220 mg via ORAL
  Filled 2020-07-30 (×4): qty 1

## 2020-07-30 MED ORDER — CALCIUM CHLORIDE 10 % IV SOLN
INTRAVENOUS | Status: AC
  Filled 2020-07-30: qty 10

## 2020-07-30 MED ORDER — JUVEN PO PACK
1.0000 | PACK | Freq: Two times a day (BID) | ORAL | Status: DC
  Administered 2020-07-31 – 2020-08-02 (×6): 1 via ORAL
  Filled 2020-07-30 (×6): qty 1

## 2020-07-30 MED ORDER — INSULIN ASPART 100 UNIT/ML IJ SOLN: 0.0000 [IU] | Freq: Every day | INTRAMUSCULAR | Status: DC

## 2020-07-30 MED ORDER — ONDANSETRON HCL 4 MG/2ML IJ SOLN: 4.0000 mg | Freq: Once | INTRAMUSCULAR | Status: DC | PRN

## 2020-07-30 MED ORDER — EPHEDRINE 5 MG/ML INJ
INTRAVENOUS | Status: AC
  Filled 2020-07-30: qty 10

## 2020-07-30 MED ORDER — CEFAZOLIN SODIUM-DEXTROSE 2-4 GM/100ML-% IV SOLN: 2.0000 g | INTRAVENOUS | Status: DC

## 2020-07-30 MED ORDER — CEFAZOLIN SODIUM 1 G IJ SOLR
INTRAMUSCULAR | Status: AC
  Filled 2020-07-30: qty 20

## 2020-07-30 MED ORDER — ORAL CARE MOUTH RINSE: 15.0000 mL | Freq: Once | OROMUCOSAL | Status: AC

## 2020-07-30 MED ORDER — GUAIFENESIN-DM 100-10 MG/5ML PO SYRP: 15.0000 mL | ORAL_SOLUTION | ORAL | Status: DC | PRN

## 2020-07-30 MED ORDER — CHLORHEXIDINE GLUCONATE 0.12 % MT SOLN: 15.0000 mL | Freq: Once | OROMUCOSAL | Status: AC

## 2020-07-30 MED ORDER — CHLORHEXIDINE GLUCONATE 0.12 % MT SOLN
OROMUCOSAL | Status: AC
  Administered 2020-07-30: 15 mL via OROMUCOSAL
  Filled 2020-07-30: qty 15

## 2020-07-30 MED ORDER — FENTANYL CITRATE (PF) 100 MCG/2ML IJ SOLN
INTRAMUSCULAR | Status: DC | PRN
  Administered 2020-07-30 (×3): 50 ug via INTRAVENOUS

## 2020-07-30 SURGICAL SUPPLY — 30 items
BLADE SURG 21 STRL SS (BLADE) ×3 IMPLANT
BNDG COHESIVE 4X5 TAN STRL (GAUZE/BANDAGES/DRESSINGS) ×3 IMPLANT
BNDG ESMARK 4X9 LF (GAUZE/BANDAGES/DRESSINGS) IMPLANT
BNDG GAUZE ELAST 4 BULKY (GAUZE/BANDAGES/DRESSINGS) ×3 IMPLANT
COVER SURGICAL LIGHT HANDLE (MISCELLANEOUS) ×3 IMPLANT
COVER WAND RF STERILE (DRAPES) IMPLANT
DRAPE U-SHAPE 47X51 STRL (DRAPES) ×3 IMPLANT
DRSG ADAPTIC 3X8 NADH LF (GAUZE/BANDAGES/DRESSINGS) ×3 IMPLANT
DRSG PAD ABDOMINAL 8X10 ST (GAUZE/BANDAGES/DRESSINGS) ×3 IMPLANT
DURAPREP 26ML APPLICATOR (WOUND CARE) ×3 IMPLANT
ELECT REM PT RETURN 9FT ADLT (ELECTROSURGICAL) ×3
ELECTRODE REM PT RTRN 9FT ADLT (ELECTROSURGICAL) ×1 IMPLANT
GAUZE SPONGE 4X4 12PLY STRL (GAUZE/BANDAGES/DRESSINGS) IMPLANT
GAUZE SPONGE 4X4 12PLY STRL LF (GAUZE/BANDAGES/DRESSINGS) ×3 IMPLANT
GLOVE BIOGEL PI IND STRL 9 (GLOVE) ×1 IMPLANT
GLOVE BIOGEL PI INDICATOR 9 (GLOVE) ×2
GLOVE SURG ORTHO 9.0 STRL STRW (GLOVE) ×3 IMPLANT
GOWN STRL REUS W/ TWL XL LVL3 (GOWN DISPOSABLE) ×2 IMPLANT
GOWN STRL REUS W/TWL XL LVL3 (GOWN DISPOSABLE) ×4
KIT BASIN OR (CUSTOM PROCEDURE TRAY) ×3 IMPLANT
KIT TURNOVER KIT B (KITS) ×3 IMPLANT
MANIFOLD NEPTUNE II (INSTRUMENTS) ×3 IMPLANT
NEEDLE 22X1 1/2 (OR ONLY) (NEEDLE) IMPLANT
NS IRRIG 1000ML POUR BTL (IV SOLUTION) ×3 IMPLANT
PACK ORTHO EXTREMITY (CUSTOM PROCEDURE TRAY) ×3 IMPLANT
PAD ABD 7.5X8 STRL (GAUZE/BANDAGES/DRESSINGS) ×3 IMPLANT
PAD ARMBOARD 7.5X6 YLW CONV (MISCELLANEOUS) ×6 IMPLANT
SUT ETHILON 2 0 PSLX (SUTURE) ×6 IMPLANT
SYR CONTROL 10ML LL (SYRINGE) IMPLANT
TOWEL GREEN STERILE (TOWEL DISPOSABLE) ×3 IMPLANT

## 2020-07-30 NOTE — Consult Note (Signed)
ORTHOPAEDIC CONSULTATION  REQUESTING PHYSICIAN: [name], MD  Chief Complaint: Swelling.  Purulent drainage left great toe  HPI: Randall Coleman is a 70 y.o. male who presents with  chronic osteomyelitis and purulent draining abscess of left great toe.  Patient is a type II diabetic who is status post a right transtibial amputation in 2018.  Patient reports a history of recent trauma to the left great toe.  Past Medical History:  Diagnosis Date  . Diabetes mellitus without complication ([place])   . DVT (deep venous thrombosis) ([place])   . Hypertension    Past Surgical History:  Procedure Laterality Date  . HERNIA REPAIR    . HERNIA REPAIR    . IR CHEST FLUORO  12/13/2018   Social History   Socioeconomic History  . Marital status: Single    Spouse name: Not on file  . Number of children: Not on file  . Years of education: Not on file  . Highest education level: Not on file  Occupational History  . Occupation: retired  Tobacco Use  . Smoking status: Current Every Day Smoker    Packs/day: 1.00    Years: 54.00    Pack years: 54.00  . Smokeless tobacco: Never Used  Substance and Sexual Activity  . Alcohol use: Not Currently    Comment: h/o heavy use, quit about 10 years ago  . Drug use: Never    Comment: used when he was younger  . Sexual activity: Not Currently  Other Topics Concern  . Not on file  Social History Narrative  . Not on file   Social Determinants of Health   Financial Resource Strain: Not on file  Food Insecurity: Not on file  Transportation Needs: Not on file  Physical Activity: Not on file  Stress: Not on file  Social Connections: Not on file   No family history on file. - negative except otherwise stated in the family history section No Known Allergies Prior to Admission medications   Medication Sig Start Date End Date Taking? Authorizing Provider  apixaban (ELIQUIS) 5 MG TABS tablet Take 5 mg by mouth 2 (two) times daily.   Yes  [provider]  ARIPiprazole (ABILIFY) 20 MG tablet Take 0.5 tablets (10 mg total) by mouth daily. 12/15/18  Yes Dhungel, Nishant, MD  aspirin EC 81 MG tablet Take 81 mg by mouth daily.   Yes [provider]  benztropine (COGENTIN) 1 MG tablet Take 1 mg by mouth daily.   Yes [provider]  buPROPion (WELLBUTRIN XL) 150 MG 24 hr tablet Take 150 mg by mouth daily.   Yes [provider]  Cyanocobalamin (VITAMIN B 12 PO) Take 1 tablet by mouth daily.   Yes [provider]  gabapentin (NEURONTIN) 300 MG capsule Take 300 mg by mouth 3 (three) times daily. 03/15/17  Yes [provider]  glipiZIDE (GLUCOTROL) 5 MG tablet Take 2.5 mg by mouth 2 (two) times daily.   Yes [provider]  lisinopril (ZESTRIL) 10 MG tablet Take 1 tablet (10 mg total) by mouth daily. 12/15/18  Yes Dhungel, Nishant, MD  metFORMIN (GLUMETZA) 1000 MG (MOD) 24 hr tablet Take 1,000 mg by mouth 2 (two) times daily.   Yes [provider]  Multiple Vitamin (MULTI-VITAMIN) tablet Take 1 tablet by mouth daily.   Yes [provider]  nicotine (NICODERM CQ - DOSED IN MG/24 HOURS) 21 mg/24hr patch Place 1 patch (21 mg total) onto the skin daily. 12/15/18  Yes  Dhungel, Nishant, MD  omeprazole (PRILOSEC) 20 MG capsule Take 20 mg by mouth 2 (two) times daily.   Yes [provider]  propranolol (INDERAL) 10 MG tablet Take 10 mg by mouth daily.   Yes [provider]  traZODone (DESYREL) 100 MG tablet Take 1 tablet (100 mg total) by mouth at bedtime. 12/15/18  Yes Dhungel, Nishant, MD  busPIRone (BUSPAR) 15 MG tablet Take 1 tablet (15 mg total) by mouth 2 (two) times daily. Patient not taking: Reported on 07/29/2020 12/15/18   Dhungel, [name], MD  insulin glargine (LANTUS) 100 UNIT/ML injection Inject 0.16 mLs (16 Units total) into the skin at bedtime. Patient not taking: Reported on 07/29/2020 12/15/18   Louellen Molder, MD   DG Foot Complete  Left  Result Date: 07/29/2020 CLINICAL DATA:  Foot infection EXAM: LEFT FOOT - COMPLETE 3+ VIEW COMPARISON:  None FINDINGS: Bony destructive findings of the distal phalanx great toe especially involving the tuft, with abnormal soft tissue swelling and some gas in the soft tissues in this vicinity indicating osteomyelitis and regional soft tissue infection. No other bony destructive findings are identified. No malalignment at the Lisfranc joint. Plantar calcaneal spur noted. Mild dorsal spurring at the talonavicular articulation. IMPRESSION: 1. Bony destructive findings in the distal phalanx great toe with local signs of soft tissue infection. The appearance is compatible with active osteomyelitis of the great toe. 2. Plantar calcaneal spur. 3. Mild dorsal spurring at the talonavicular articulation. Electronically Signed   By: Van Clines M.D.   On: 07/29/2020 12:36   VAS [place] ABI WITH/WO TBI  Result Date: 07/30/2020  LOWER EXTREMITY DOPPLER STUDY Patient Name:  Randall Coleman  Date of Exam:   07/30/2020 Medical Rec #: [id number]       Accession #:    [id number] Date of Birth: 01/28/1951        Patient Gender: M Patient Age:   69Y Exam Location:  [facility] Procedure:      VAS [place] ABI WITH/WO TBI Referring Phys: Price JEFFERY --------------------------------------------------------------------------------  Indications: Ulceration, and Pre-op LLE great toe amputation. High Risk Factors: Hypertension, Diabetes, current smoker. Other Factors: Afib, Hx DVT LLE, neuropathy.  Comparison Study: No previous exams Performing Technologist: Hill, Jody RVT, RDMS  Examination Guidelines: A complete evaluation includes at minimum, Doppler waveform signals and systolic blood pressure reading at the level of bilateral brachial, anterior tibial, and posterior tibial arteries, when vessel segments are accessible. Bilateral testing is considered an integral part of a complete examination. Photoelectric  Plethysmograph (PPG) waveforms and toe systolic pressure readings are included as required and additional duplex testing as needed. Limited examinations for reoccurring indications may be performed as noted.  ABI Findings: +---------+------------------+-----+---------+--------+ Right    Rt Pressure (mmHg)IndexWaveform Comment  +---------+------------------+-----+---------+--------+ Brachial 141                    triphasic         +---------+------------------+-----+---------+--------+ PTA      149               1.06 triphasic         +---------+------------------+-----+---------+--------+ DP       147               1.04 biphasic          +---------+------------------+-----+---------+--------+ Great Toe104               0.74 Normal            +---------+------------------+-----+---------+--------+ +---------+------------------+-----+----------+------------------------------+  Left     Lt Pressure (mmHg)IndexWaveform  Comment                        +---------+------------------+-----+----------+------------------------------+ Brachial 133                    triphasic                                +---------+------------------+-----+----------+------------------------------+ PTA      148               1.05 biphasic                                 +---------+------------------+-----+----------+------------------------------+ DP       124               0.88 monophasic                               +---------+------------------+-----+----------+------------------------------+ Great Toe99                0.70 Normal    2nd toe - open wound great toe +---------+------------------+-----+----------+------------------------------+ +-------+-----------+-----------+------------+------------+ ABI/TBIToday's ABIToday's TBIPrevious ABIPrevious TBI +-------+-----------+-----------+------------+------------+ Right  1.06       0.74                                 +-------+-----------+-----------+------------+------------+ Left   1.05       0.70                                +-------+-----------+-----------+------------+------------+  LLE ABI/TBI values indicate normal resting flow however, doppler waveforms indicate these might be falsely elevated in the setting of infection.  Summary: Right: Resting right ankle-brachial index is within normal range. No evidence of significant right lower extremity arterial disease. The right toe-brachial index is normal. Left: Resting left ankle-brachial index is within normal range. No evidence of significant left lower extremity arterial disease. The left toe-brachial index is normal.  *See table(s) above for measurements and observations.     Preliminary    - pertinent xrays, CT, MRI studies were reviewed and independently interpreted  Positive ROS: All other systems have been reviewed and were otherwise negative with the exception of those mentioned in the HPI and as above.  Physical Exam: General: Alert, no acute distress Psychiatric: Patient is competent for consent with normal mood and affect Lymphatic: No axillary or cervical lymphadenopathy Cardiovascular: No pedal edema Respiratory: No cyanosis, no use of accessory musculature GI: No organomegaly, abdomen is soft and non-tender    Images:  @ENCIMAGES @  Labs:  Lab Results  Component Value Date   HGBA1C 8.0 (H) 07/29/2020   HGBA1C 7.3 (H) 12/10/2018   REPTSTATUS PENDING 07/29/2020   CULT  07/29/2020    NO GROWTH < 24 HOURS Performed at [facility] [address] [place], [place] [address]     Lab Results  Component Value Date   ALBUMIN 4.2 12/09/2018     CBC EXTENDED Latest Ref Rng & Units 07/30/2020 07/29/2020 12/14/2018  WBC 4.0 - 10.5 K/uL 6.4 8.0 7.1  RBC 4.22 - 5.81 MIL/uL 3.96(L) 4.45 4.85  HGB 13.0 -  17.0 g/dL 11.4(L) 12.5(L) 14.0  HCT 39.0 - 52.0 % 32.3(L) 36.0(L) 41.6  PLT 150 - 400 K/uL 223 257 158  NEUTROABS 1.7 -  7.7 K/uL 4.7 6.5 -  LYMPHSABS 0.7 - 4.0 K/uL 0.9 0.6(L) -    Neurologic: Patient does not have protective sensation bilateral lower extremities.   MUSCULOSKELETAL:   Skin:  Skin: Examination patient has purulent drainage beneath the nail of the left great toe.  There is sausage digit swelling and cellulitis that extends up to the MTP joint.  Patient has a palpable dorsalis pedis and posterior tibial pulse.  Most recent hemoglobin A1c is pending previous hemoglobin A1c is 9.5.  Review of the radiographs shows chronic complete destruction of the tuft of the left great toe consistent with chronic osteomyelitis.    Assessment: Assessment: Diabetic insensate neuropathy with a right transtibial amputation with abscess and osteomyelitis left great toe.  Plan: Will plan for amputation of the left great toe at the MTP joint today.  Risks and benefits were discussed including risk of a more proximal infection and need for additional surgery as well as risks of the wound not healing.  Patient states he understands wished to proceed at this time.  Thank you for the consult and the opportunity to see Mr. Randall Coleman, [place] [phone] 1:11 PM

## 2020-07-30 NOTE — Care Management (Addendum)
Notified Randall Coleman at the [facility] of Admission. Patient's seen at the [facility] office, PCP is Dr Ronnald Ramp, [place] is Randall Coleman [phone]  Updated Randall Coleman admission website, confirmation number [id number]

## 2020-07-30 NOTE — Progress Notes (Signed)
Subjective: No acute events overnight.   Randall Coleman reports continued pain in his LLE, although he notes that the inflammation looks improved with diminished redness in his left foot. He was informed of available prn medications for pain. Surgery has not seen him yet this morning, he understands likely plan of toe amputation. He has an appetite today and would like to eat but is NPO for now.   Objective:  Vital signs in last 24 hours: Vitals:   07/29/20 2031 07/29/20 2308 07/30/20 0406 07/30/20 0954  BP: (!) 141/65 138/81 (!) 141/69 (!) 126/55  Pulse: 83 87 97 80  Resp: 20 19 18 17   Temp: 98 F (36.7 C) 98.1 F (36.7 C) 98.7 F (37.1 C) 98.2 F (36.8 C)  TempSrc: Oral Oral Oral   SpO2: 99% 100% 97% 98%  Weight: 97.1 kg     Height: 6\' 2"  (1.88 m)      Weight change:   Intake/Output Summary (Last 24 hours) at 07/30/2020 1139 Last data filed at 07/30/2020 0800 Gross per 24 hour  Intake 1961.06 ml  Output 2000 ml  Net -38.94 ml   Labs: CBC Latest Ref Rng & Units 07/30/2020 07/29/2020 12/14/2018  WBC 4.0 - 10.5 K/uL 6.4 8.0 7.1  Hemoglobin 13.0 - 17.0 g/dL 11.4(L) 12.5(L) 14.0  Hematocrit 39.0 - 52.0 % 32.3(L) 36.0(L) 41.6  Platelets 150 - 400 K/uL 223 257 158   CMP Latest Ref Rng & Units 07/30/2020 07/30/2020 07/29/2020  Glucose 70 - 99 mg/dL 176(H) 181(H) 113(H)  BUN 8 - 23 mg/dL 8 7(L) 10  Creatinine 0.61 - 1.24 mg/dL 0.74 0.86 0.89  Sodium 135 - 145 mmol/L 131(L) 131(L) 132(L)  Potassium 3.5 - 5.1 mmol/L 4.4 4.4 5.2(H)  Chloride 98 - 111 mmol/L 102 99 99  CO2 22 - 32 mmol/L 22 25 26   Calcium 8.9 - 10.3 mg/dL 8.5(L) 8.7(L) 8.5(L)  Total Protein 6.5 - 8.1 g/dL - - -  Total Bilirubin 0.3 - 1.2 mg/dL - - -  Alkaline Phos 38 - 126 U/L - - -  AST 15 - 41 U/L - - -  ALT 0 - 44 U/L - - -   Component Ref Range & Units 1 d ago  Hgb A1c MFr Bld 4.8 - 5.6 % 8.0   Physical Exam:  General: Older gentleman resting comfortably in bed in no acute distress CV: Regular rate and rhythm.  S1/S2 present with no murmurs, rubs, or gallops appreciated. Unable to palpate distal LE pulses bilaterally.  Pulm: Lungs CTAB. No wheezes or crackles. Breathing comfortably on room air Abd: Nontender, nondistended. Normoactive bowel sounds Derm: Mild interval improvement in Left foot erythema, although erythematous streak to L mid-shin remains. L foot is tender and warm to touch. L great toe remains grossly swollen, purulent, and discolored with left foot edematous and erythematous. LLE hyperpigmented.  Neuro: Alert and grossly oriented. Diminished light touch sensation in distal plantar aspecst of BL feet Psych: Appropriate affect. Thought process logical, linear, and goal-directed  Assessment/Plan:  Active Problems:   Acute osteomyelitis [facility])  Randall Coleman is a 70 year old gentleman with a past medical history of DM2, Afib on Eliquis, neuropathy, and DVT admitted for osteomyelitis of L great toe and hypotonic hyponatremia.   #Acute osteomyelitis of L great toe Grossly infected L great toe with Xray consistent with osteomyelitis. Left foot and anterior LE erythematous streak slightly improved today, although he continues to have significant pain. Ortho has confirmed amputation at MTP joint today. The  underlying mechanism of his toe infection is unclear; he reports sudden onset of toe pain and swelling, but given his history of DM2 and peripheral neuropathy, he could have sustained a puncture wound that he did not notice. Will add on Flagyl to include pseudomonal coverage. He remains afebrile with white count within normal limits, will continue to monitor.  - Continue IV vancomycin and IV cefepime - Start IV Flagyl  - Appreciate ortho involvement. Amputation at MTP joint scheduled for today - ABI done today. Awaiting final report, preliminary read is normal  - No growth of blood cultures at 24 hours, final report pending - WBC 6.4 today from 8.0  - Continue to follow CBC - Continue Norco and  Dilaudid prn for pain control - Can restart heart healthy diet w/carb restriction after surgery   #Hypotonic hyponatremia, improving Found to have Na 122 at admission for which IV NS was started. Na was mildly over-corrected to 132 over 15 hours, NS was stopped and repeat BMP showed Na stable at 131. Patient was given 528mL bolus D5 to prevent further increase in Na. Given initial mild hyponatremia with Na>120, no history of liver disease or malnutrition, and K wnl at admission, patient is overall at low risk for osmotic demyelination syndrome, however will continue careful monitoring of BMP.  - Na 131 from 122 at admission, high of 132 15 hours after presentation. Now s/p 570mL D5 - Hold IVF for now - Continue to monitor BMP   #Afib Patient has regular rhythm on exam and heart rate has been 72-96 during admission.  - Continue to hold Eliquis due to toe amputation, will plan on restarting tomorrow  Code Status: FULL Fluids: none Diet: NPO for surgery, otherwise heart healthy w/carb restriction VTE ppx: SCDs   LOS: 1 day   Theodosia Blender, Medical Student 07/30/2020, 11:39 AM

## 2020-07-30 NOTE — Progress Notes (Signed)
ABI exam has been completed.  Results can be found under chart review under CV PROC. 07/30/2020 12:41 PM Kiya Eno RVT, RDMS

## 2020-07-30 NOTE — Op Note (Signed)
07/30/2020  4:53 PM  PATIENT:  [name]    PRE-OPERATIVE DIAGNOSIS:  Osteomylitis/abscess left great toe  POST-OPERATIVE DIAGNOSIS:  Same  PROCEDURE:  AMPUTATION LEFT GREAT TOE  SURGEON:  [name], MD  PHYSICIAN ASSISTANT:None ANESTHESIA:   General  PREOPERATIVE INDICATIONS:  [name] is a  70 y.o. male with a diagnosis of Osteomylitis/abscess left great toe who failed conservative measures and elected for surgical management.    The risks benefits and alternatives were discussed with the patient preoperatively including but not limited to the risks of infection, bleeding, nerve injury, cardiopulmonary complications, the need for revision surgery, among others, and the patient was willing to proceed.  OPERATIVE IMPLANTS: None  @ENCIMAGES @  OPERATIVE FINDINGS: Margins clear no abscess or infection of the MTP joint.  OPERATIVE PROCEDURE: Patient was brought the operating room and underwent a general anesthetic.  After adequate levels anesthesia were obtained patient's left lower extremity was prepped using DuraPrep draped into a sterile field a timeout was called.  A fishmouth incision was made just distal to the MTP joint.  The great toe was amputated through the MTP joint.  Tissue close to the margins was further resected back to healthy viable tissue margins.  There was minimal petechial bleeding.  The wound was irrigated with normal saline incision closed using 2-0 nylon and a sterile dressing was applied patient was extubated taken the PACU in stable condition   DISCHARGE PLANNING:  Antibiotic duration: Continue antibiotics for 24 hours  Weightbearing: Touchdown weightbearing on the left  Pain medication: Opioid pathway  Dressing care/ Wound VAC: Dry dressing  Ambulatory devices: Walker or crutches  Discharge to: Anticipate discharge to home when safe with therapy.  Follow-up: In the office 1 week post operative.

## 2020-07-30 NOTE — Anesthesia Preprocedure Evaluation (Signed)
Anesthesia Evaluation  Patient identified by MRN, date of birth, ID band Patient awake    Reviewed: Allergy & Precautions, NPO status , Patient's Chart, lab work & pertinent test results  Airway Mallampati: II  TM Distance: >3 FB Neck ROM: Full    Dental  (+) Dental Advisory Given, Upper Dentures, Lower Dentures   Pulmonary Current Smoker and Patient abstained from smoking.,    Pulmonary exam normal breath sounds clear to auscultation       Cardiovascular hypertension, + DVT  Normal cardiovascular exam Rhythm:Regular Rate:Normal     Neuro/Psych PSYCHIATRIC DISORDERS Anxiety Depression negative neurological ROS     GI/Hepatic negative GI ROS, Neg liver ROS,   Endo/Other  diabetes, Type 2, Oral Hypoglycemic Agents  Renal/GU negative Renal ROS     Musculoskeletal Osteomylitis/abscess left great toe   Abdominal   Peds  Hematology  (+) Blood dyscrasia, anemia ,   Anesthesia Other Findings Day of surgery medications reviewed with the patient.  Reproductive/Obstetrics                             Anesthesia Physical Anesthesia Plan  ASA: III  Anesthesia Plan: General   Post-op Pain Management:    Induction: Intravenous  PONV Risk Score and Plan: 2 and Dexamethasone and Ondansetron  Airway Management Planned: LMA  Additional Equipment:   Intra-op Plan:   Post-operative Plan: Extubation in OR  Informed Consent: I have reviewed the patients History and Physical, chart, labs and discussed the procedure including the risks, benefits and alternatives for the proposed anesthesia with the patient or authorized representative who has indicated his/her understanding and acceptance.     Dental advisory given  Plan Discussed with: CRNA  Anesthesia Plan Comments:         Anesthesia Quick Evaluation

## 2020-07-30 NOTE — Progress Notes (Signed)
  Date: 07/30/2020  Patient name: Randall Coleman  Medical record number: [id number]  Date of birth: 06/29/1950   I have seen and evaluated Randall Coleman and discussed their care with the Residency Team.  In brief, patient is 70 year old male with a past medical history of type 2 diabetes, A. fib on Eliquis, diabetic neuropathy and DVT who presented to the ED with worsening left great toe pain with associated swelling and purulent drainage over the last 4 days.  Patient states that he noted pain and darkish discoloration of his left great toe approximately 4 days prior to admission.  Pain progressively worsened over the next 4 days and he noted purulent drainage from the toe beginning on the night prior to his admission.  Patient also noted an area of redness extending up to his shin from the toe.  Patient denies any history of trauma but does have a history of diabetic neuropathy and may not have noted any trauma.  No fevers or chills, no chest pain, no shortness of breath, no palpitations, no lightheadedness, no syncope, no focal weakness, no tingling or numbness, no headache, no blurry vision.  Today, patient still complains of pain in his left great toe but denies any other complaints currently.  PMHx, Fam Hx, and/or Soc Hx : As per resident admit note  Vitals:   07/30/20 0406 07/30/20 0954  BP: (!) 141/69 (!) 126/55  Pulse: 97 80  Resp: 18 17  Temp: 98.7 F (37.1 C) 98.2 F (36.8 C)  SpO2: 97% 98%   General: Awake, alert, oriented x3, NAD CVS: Regular rhythm, normal heart sounds Lungs: CTA bilaterally Abdomen: Soft, nontender, nondistended, normoactive bowel sounds Extremities: Left great toe swollen with erythema and breakdown of the skin.  Erythema extends approximately over the dorsum of the foot.  Increased local warmth noted over the toe and dorsum of the foot.  Purulent drainage noted from the left great toe. Skin: Erythema noted along the dorsum of the foot extending to the left  anterior shin.  Skin breakdown noted over left great toe. HEENT: Normocephalic, atraumatic Psych: Normal mood and affect  Assessment and Plan: I have seen and evaluated the patient as outlined above. I agree with the formulated Assessment and Plan as detailed in the residents' note, with the following changes:   1.  Osteomyelitis of left great toe: -Patient presented to ED with progressive swelling, pain and erythema over his left great toe and is found to have left great toe osteomyelitis with surrounding cellulitis. -Patient has a normal white count and lactic acid. -We will continue broad-spectrum antibiotics for now with metronidazole, cefepime and vancomycin.  Suspect that his infection will resolve after amputation of his great toe -Patient scheduled for amputation of left great toe today.  Ortho follow-up and recommendations appreciated -We will follow-up ABIs -Continue pain control for now -Blood cultures with no growth to date -No further work-up at this time  2.  Hypotonic hypervolemic hyponatremia: -Patient noted to have hyponatremia on admission with a sodium of 122.  This is increased to 131 today.  I suspect is secondary to hypokalemia secondary to decreased oral intake secondary to his infection. -We will continue to monitor his sodium closely. -No further work-up at this time  [name], MD 6/1/20221:31 PM

## 2020-07-30 NOTE — Progress Notes (Signed)
Patient's CBG=346,MD on call notified. Order received. Will continue to monitor.

## 2020-07-30 NOTE — Transfer of Care (Signed)
Immediate Anesthesia Transfer of Care Note  Patient: Randall Coleman  Procedure(s) Performed: AMPUTATION LEFT GREAT TOE (Left Toe)  Patient Location: PACU  Anesthesia Type:General  Level of Consciousness: awake, alert  and oriented  Airway & Oxygen Therapy: Patient Spontanous Breathing and Patient connected to face mask oxygen  Post-op Assessment: Report given to RN and Post -op Vital signs reviewed and stable  Post vital signs: Reviewed and stable  Last Vitals:  Vitals Value Taken Time  BP 121/65 07/30/20 1651  Temp    Pulse 88 07/30/20 1652  Resp 20 07/30/20 1652  SpO2 100 % 07/30/20 1652  Vitals shown include unvalidated device data.  Last Pain:  Vitals:   07/30/20 1136  TempSrc:   PainSc: 8       Patients Stated Pain Goal: 3 (72/09/47 0962)  Complications: No complications documented.

## 2020-07-30 NOTE — Anesthesia Procedure Notes (Signed)
Procedure Name: LMA Insertion Date/Time: 07/30/2020 4:20 PM Performed by: Genelle Bal, CRNA Pre-anesthesia Checklist: Patient identified, Emergency Drugs available, Suction available and Patient being monitored Patient Re-evaluated:Patient Re-evaluated prior to induction Oxygen Delivery Method: Circle system utilized Preoxygenation: Pre-oxygenation with 100% oxygen Induction Type: IV induction Ventilation: Mask ventilation without difficulty LMA: LMA inserted LMA Size: 5.0 Number of attempts: 1 Airway Equipment and Method: Bite block Placement Confirmation: positive ETCO2 Tube secured with: Tape Dental Injury: Teeth and Oropharynx as per pre-operative assessment

## 2020-07-30 NOTE — Progress Notes (Signed)
Orthopedic Tech Progress Note Patient Details:  Randall Coleman August 06, 1950 [id number]  Ortho Devices Type of Ortho Device: Postop shoe/boot Ortho Device/Splint Location: LLE Ortho Device/Splint Interventions: Ordered,Application   Post Interventions Patient Tolerated: Well Instructions Provided: Care of [place] 07/30/2020, 6:32 PM

## 2020-07-31 ENCOUNTER — Encounter (HOSPITAL_COMMUNITY): Payer: Self-pay | Admitting: Orthopedic Surgery

## 2020-07-31 DIAGNOSIS — L03116 Cellulitis of left lower limb: Secondary | ICD-10-CM

## 2020-07-31 LAB — BASIC METABOLIC PANEL
Anion gap: 5 (ref 5–15)
Anion gap: 9 (ref 5–15)
BUN: 9 mg/dL (ref 8–23)
BUN: 11 mg/dL (ref 8–23)
CO2: 25 mmol/L (ref 22–32)
CO2: 25 mmol/L (ref 22–32)
Calcium: 8.3 mg/dL — ABNORMAL LOW (ref 8.9–10.3)
Calcium: 8.8 mg/dL — ABNORMAL LOW (ref 8.9–10.3)
Chloride: 98 mmol/L (ref 98–111)
Chloride: 96 mmol/L — ABNORMAL LOW (ref 98–111)
Creatinine, Ser: 0.75 mg/dL (ref 0.61–1.24)
Creatinine, Ser: 0.71 mg/dL (ref 0.61–1.24)
GFR, Estimated: >60 mL/min (ref >60)
GFR, Estimated: >60 mL/min (ref >60)
Glucose, Bld: 255 mg/dL — ABNORMAL HIGH (ref 70–99)
Glucose, Bld: 218 mg/dL — ABNORMAL HIGH (ref 70–99)
Potassium: 4.8 mmol/L (ref 3.5–5.1)
Potassium: 4.5 mmol/L (ref 3.5–5.1)
Sodium: 128 mmol/L — ABNORMAL LOW (ref 135–145)
Sodium: 130 mmol/L — ABNORMAL LOW (ref 135–145)

## 2020-07-31 LAB — GLUCOSE, CAPILLARY
Glucose-Capillary: 128 mg/dL — ABNORMAL HIGH (ref 70–99)
Glucose-Capillary: 191 mg/dL — ABNORMAL HIGH (ref 70–99)
Glucose-Capillary: 244 mg/dL — ABNORMAL HIGH (ref 70–99)
Glucose-Capillary: 270 mg/dL — ABNORMAL HIGH (ref 70–99)
Glucose-Capillary: 284 mg/dL — ABNORMAL HIGH (ref 70–99)

## 2020-07-31 MED ORDER — DOXYCYCLINE HYCLATE 100 MG PO TABS: 100.0000 mg | ORAL_TABLET | Freq: Two times a day (BID) | ORAL | 0 refills | Status: DC

## 2020-07-31 MED ORDER — INSULIN ASPART 100 UNIT/ML IJ SOLN
6.0000 [IU] | Freq: Once | INTRAMUSCULAR | Status: AC
  Administered 2020-07-31: 6 [IU] via SUBCUTANEOUS

## 2020-07-31 NOTE — Progress Notes (Signed)
Patient is postop day 1 status post great toe amputation.  He is lying in bed comfortable.  Dressing is clean dry and intact.  Discussed with Dr. Sharol Given of her operative findings patient will require 24 hours total of IV antibiotics from surgery after that she will be discharged on 1 month of doxycycline and probiotic

## 2020-07-31 NOTE — Anesthesia Postprocedure Evaluation (Signed)
Anesthesia Post Note  Patient: Randall Coleman  Procedure(s) Performed: AMPUTATION LEFT GREAT TOE (Left Toe)     Patient location during evaluation: PACU Anesthesia Type: General Level of consciousness: awake and alert Pain management: pain level controlled Vital Signs Assessment: post-procedure vital signs reviewed and stable Respiratory status: spontaneous breathing, nonlabored ventilation, respiratory function stable and patient connected to nasal cannula oxygen Cardiovascular status: blood pressure returned to baseline and stable Postop Assessment: no apparent nausea or vomiting Anesthetic complications: no   No complications documented.  Last Vitals:  Vitals:   07/31/20 0536 07/31/20 0915  BP: 109/67 137/70  Pulse: 61 82  Resp: 17 18  Temp: 36.4 C 36.8 C  SpO2: 98% 100%    Last Pain:  Vitals:   07/31/20 1439  TempSrc:   PainSc: [place]

## 2020-07-31 NOTE — Progress Notes (Signed)
Subjective: No acute events overnight.  Randall Coleman was seen resting comfortably in bed this morning. He says he did fine with the surgery yesterday. He did not sleep well overnight but is otherwise doing ok. Pain is under reasonable control with his current regimen. He tolerated breakfast without any nausea and is urinating without difficulty. Denies fevers or chills this morning.   Objective:  Vital signs in last 24 hours: Vitals:   07/30/20 1750 07/30/20 2102 07/31/20 0536 07/31/20 0915  BP: (!) 122/98 137/73 109/67 137/70  Pulse: 87 76 61 82  Resp: 17 18 17 18   Temp: 99.3 F (37.4 C) 98.5 F (36.9 C) 97.6 F (36.4 C) 98.3 F (36.8 C)  TempSrc: Oral  Oral   SpO2: 95% 95% 98% 100%  Weight:      Height:       Weight change:   Intake/Output Summary (Last 24 hours) at 07/31/2020 1058 Last data filed at 07/31/2020 0800 Gross per 24 hour  Intake 3015.49 ml  Output 1420 ml  Net 1595.49 ml   Labs:  CBC Latest Ref Rng & Units 07/30/2020 07/29/2020 12/14/2018  WBC 4.0 - 10.5 K/uL 6.4 8.0 7.1  Hemoglobin 13.0 - 17.0 g/dL 11.4(L) 12.5(L) 14.0  Hematocrit 39.0 - 52.0 % 32.3(L) 36.0(L) 41.6  Platelets 150 - 400 K/uL 223 257 158   BMP Latest Ref Rng & Units 07/31/2020 07/30/2020 07/30/2020  Glucose 70 - 99 mg/dL 255(H) 344(H) 157(H)  BUN 8 - 23 mg/dL 9 8 6(L)  Creatinine 0.61 - 1.24 mg/dL 0.75 0.97 0.71  Sodium 135 - 145 mmol/L 128(L) 128(L) 133(L)  Potassium 3.5 - 5.1 mmol/L 4.8 5.2(H) 4.5  Chloride 98 - 111 mmol/L 98 97(L) 101  CO2 22 - 32 mmol/L 25 25 25   Calcium 8.9 - 10.3 mg/dL 8.3(L) 8.2(L) 8.8(L)   Physical Exam:  General: Well-appearing gentleman resting in bed in no acute distress HENT: Normocephalic, atraumatic. Moist mucus membranes. Hearing grossly intact CV: Regular rate and rhythm. S1/S2 present with no murmurs/rubs/or gallops Pulm: Lungs CTA bilaterally with no wheezes or crackles. Breathing comfortably on room air Abd: Nontender, nondistended. Normoactive bowel  sounds MSK: LLE bandaged from foot to mid-shin, unable to visualize skin. No gross swelling of LLE, although feels warmer to touch than RLE. Neuro: Alert and grossly oriented. Speech and cognition grossly intact Psych: Appropriate affect. Thought process is linear, logical, and goal-oriented  Assessment/Plan:  Active Problems:   Acute osteomyelitis (HCC)   Osteomyelitis of great toe of left foot (HCC)   Abscess of left great toe  Randall Coleman is a 70 year old man with a history of DM2, Afib on Eliquis, peripheral neuropathy, and DVT in LLE who was admitted for osteomyelitis of L great toe and hypotonic hyponatremia.   #s/p L great toe amputation for acute osteomyelitis #LLE cellulitis Ortho amputated L great toe yesterday without difficulties per operative note. Randall Coleman reports feeling well this morning with adequate pain control and minimal complaints. His L foot and toe were well-bandaged on physical exam, but LLE was warm to the touch. On prior exams, he had cellulitis extending from L toe up anterior shin. While the infected bone has been removed, suspect that it will take a few more days of IV antibiotics for the cellulitis to resolve. We will continue to monitor.  - Appreciate ongoing ortho involvement and recommendations - Continue broad-spectrum antibiotic coverage with IV vancomycin, cefepime, and metronidazole for now - No growth of blood cultures at 2 days, will continue  to follow - Norco and dilaudid prns for pain control  - Post-op PT evaluation today   #Hypotonic hypovolemic hyponatremia, improving  Na stabilized at 130 (corrected for hyperglycemia) from 122 at admission. Will hold off on further repletion with IVF as he is tolerating po diet and continue to monitor.  - Hold off on IV fluids for now - Continue BMP q8hrs  #DM2 Patient was found to have elevated glucose to 346 overnight and received 12 units of insulin Aspart. This could have been related to stress response  following surgery vs administration of D5 bolus earlier in the day. Will continue to monitor blood glucose.   - Continue CBG monitoring 4x daily  - Continue sliding-scale insulin with meals  Code Status: FULL Fluids: none Diet: regular with carb restriction  VTE ppx:    LOS: 2 days   Theodosia Blender, Medical Student 07/31/2020, 10:58 AM

## 2020-07-31 NOTE — Evaluation (Signed)
Physical Therapy Evaluation Patient Details Name: Randall Coleman MRN: [id number] DOB: 1950-10-18 Today's Date: 07/31/2020   History of Present Illness  70 year old male presents to the ED 5/31 with 4 days of worsening left great toe pain with swelling and purulent drainage. Admitted and is s/p 6/1 L great toe amputation.  PMH: DM2, Afib on Eliquis, neuropathy, and DVT.  Clinical Impression  PTA pt living alone in single story apartment with 3 steps to enter. Pt reports complete independence without AD, driving and performing shopping and meds management. Pt is currently limited in safe mobility by L foot pain and decreased knowledge of DME. Pt is supervision for bed mobility, min guard for transfers and contact guard assist for ambulation with RW. PT recommending HHPT at discharge to improve gait. PT will continue to follow acutely.     Follow Up Recommendations Home health PT;Supervision for mobility/OOB    Equipment Recommendations  None recommended by PT (has necessary equipment)       Precautions / Restrictions Precautions Precautions: None Restrictions Weight Bearing Restrictions: No      Mobility  Bed Mobility Overal bed mobility: Needs Assistance Bed Mobility: Supine to Sit     Supine to sit: Supervision     General bed mobility comments: supervision for safety, HoB elevated, use of bedrail to pull to EoB    Transfers Overall transfer level: Needs assistance Equipment used: Rolling walker (2 wheeled) Transfers: Sit to/from Stand Sit to Stand: Min guard         General transfer comment: min guard for safety, vc for hand placement for power up and steadying  Ambulation/Gait Ambulation/Gait assistance: Min assist Gait Distance (Feet): 30 Feet (1x10, 1x20) Assistive device: Rolling walker (2 wheeled) Gait Pattern/deviations: Step-to pattern;Decreased step length - right;Decreased step length - left;Decreased weight shift to left;Decreased stance time -  right;Antalgic;Trunk flexed Gait velocity: slowed Gait velocity interpretation: <1.31 ft/sec, indicative of household ambulator General Gait Details: contact guard assist for safety, vc for proximity to RW and upright posture, initially very halting gait, more fluidity with distance        Balance Overall balance assessment: Needs assistance Sitting-balance support: Feet supported;No upper extremity supported Sitting balance-Leahy Scale: Good     Standing balance support: During functional activity;No upper extremity supported Standing balance-Leahy Scale: Fair Standing balance comment: able to stand at toilet and urinate without UE support                             Pertinent Vitals/Pain Pain Assessment: 0-10 Pain Score: 7  Pain Location: L foot pain Pain Descriptors / Indicators: Numbness;Aching;Shooting;Sharp Pain Intervention(s): Limited activity within patient's tolerance;Monitored during session;Repositioned;Patient requesting pain meds-RN notified    Home Living Family/patient expects to be discharged to:: Private residence Living Arrangements: Children   Type of Home: Apartment Home Access: Stairs to enter Entrance Stairs-Rails: Right Entrance Stairs-Number of Steps: 3 Home Layout: One level Home Equipment: [place] - single point;Walker - 2 wheels      Prior Function Level of Independence: Independent         Comments: driving        Extremity/Trunk Assessment   Upper Extremity Assessment Upper Extremity Assessment: Overall WFL for tasks assessed    Lower Extremity Assessment Lower Extremity Assessment: LLE deficits/detail LLE Deficits / Details: L great toe amputation, decreased ankle ROM 2* to swelling, strength in hip and knee 4/5 LLE Sensation: decreased light touch (numbness) LLE Coordination: decreased fine motor  Communication   Communication: No difficulties  Cognition Arousal/Alertness: Awake/alert Behavior During  Therapy: WFL for tasks assessed/performed Overall Cognitive Status: Within Functional Limits for tasks assessed                                        General Comments General comments (skin integrity, edema, etc.): VSS on RA    Exercises General Exercises - Lower Extremity Ankle Circles/Pumps: AROM;Both;20 reps;Seated   Assessment/Plan    PT Assessment Patient needs continued PT services  PT Problem List Decreased range of motion;Decreased activity tolerance;Decreased balance;Decreased mobility;Decreased knowledge of use of DME;Pain;Impaired sensation       PT Treatment Interventions DME instruction;Gait training;Stair training;Functional mobility training;Therapeutic activities;Therapeutic exercise;Balance training;Cognitive remediation;Patient/family education    PT Goals (Current goals can be found in the Care Plan section)  Acute Rehab PT Goals Patient Stated Goal: get back home PT Goal Formulation: With patient Time For Goal Achievement: 08/14/20 Potential to Achieve Goals: Good    Frequency Min 3X/week    AM-PAC PT "6 Clicks" Mobility  Outcome Measure Help needed turning from your back to your side while in a flat bed without using bedrails?: None Help needed moving from lying on your back to sitting on the side of a flat bed without using bedrails?: None Help needed moving to and from a bed to a chair (including a wheelchair)?: A Little Help needed standing up from a chair using your arms (e.g., wheelchair or bedside chair)?: A Little Help needed to walk in hospital room?: A Little Help needed climbing 3-5 steps with a railing? : A Lot 6 Click Score: 19    End of Session Equipment Utilized During Treatment: Gait belt Activity Tolerance: Patient limited by pain Patient left: in chair;with call bell/phone within reach;with chair alarm set Nurse Communication: Mobility status;Patient requests pain meds PT Visit Diagnosis: Other abnormalities of gait  and mobility (R26.89);Difficulty in walking, not elsewhere classified (R26.2);Pain Pain - Right/Left: Left Pain - part of body: Ankle and joints of foot    Time: 0924-1000 PT Time Calculation (min) (ACUTE ONLY): 36 min   Charges:   PT Evaluation $PT Eval Moderate Complexity: 1 Mod PT Treatments $Gait Training: 8-22 mins        Kyndall Chaplin B. Migdalia Dk PT, DPT Acute Rehabilitation Services Pager [phone] Office [phone]   [place] 07/31/2020, 10:14 AM

## 2020-07-31 NOTE — Evaluation (Signed)
Occupational Therapy Evaluation Patient Details Name: Randall Coleman MRN: [id number] DOB: 07/15/1950 Today's Date: 07/31/2020    History of Present Illness 70 year old male presents to the ED 5/31 with 4 days of worsening left great toe pain with swelling and purulent drainage. Admitted and is s/p 6/1 L great toe amputation.  PMH: DM2, Afib on Eliquis, neuropathy, and DVT.   Clinical Impression   Pt presents with decline in function and safety with ADLs and ADL mobility with impaired balance and endurance. PTA, pt lived at home alone and was Ind with ADLs, selfcare, IADLs, home mgt, mobility and was driving. Pt currently requires min - min guard A with LB ADLs, min guard A with grooming tasks standing at sink and min guard A with ADL transfers and mobility using RW. Pt would benefit from acute OT services to address impairments to maximize level of function and safety    Follow Up Recommendations  Home health OT    Equipment Recommendations  None recommended by OT    Recommendations for Other Services       Precautions / Restrictions Precautions Precautions: None Required Braces or Orthoses: Other Brace      Mobility Bed Mobility               General bed mobility comments: pt in recliner upon arrival    Transfers Overall transfer level: Needs assistance Equipment used: Rolling walker (2 wheeled) Transfers: Sit to/from Stand Sit to Stand: Min guard         General transfer comment: min guard for safety, verbal cues for hand placement    Balance Overall balance assessment: Needs assistance Sitting-balance support: Feet supported;No upper extremity supported Sitting balance-Leahy Scale: Good     Standing balance support: During functional activity;No upper extremity supported Standing balance-Leahy Scale: Fair                             ADL either performed or assessed with clinical judgement   ADL Overall ADL's : Needs  assistance/impaired Eating/Feeding: Independent;Sitting   Grooming: Wash/dry hands;Wash/dry face;Min guard;Standing   Upper Body Bathing: Set up;Independent;Sitting   Lower Body Bathing: Minimal assistance;Min guard   Upper Body Dressing : Set up;Sitting   Lower Body Dressing: Minimal assistance;Min guard   Toilet Transfer: Min guard;Ambulation;RW;Cueing for safety;Grab bars;Regular Toilet   Toileting- Water quality scientist and Hygiene: Min guard;Sit to/from stand       Functional mobility during ADLs: Min guard;Rolling walker;Cueing for safety General ADL Comments: Pt educated on LB compensatory ADL techniques     Vision Patient Visual Report: No change from baseline       Perception     Praxis      Pertinent Vitals/Pain Pain Assessment: 0-10 Pain Score: 5  Pain Location: L foot pain Pain Descriptors / Indicators: Numbness;Aching;Shooting;Sharp Pain Intervention(s): Monitored during session;Premedicated before session;Repositioned     Hand Dominance Right   Extremity/Trunk Assessment Upper Extremity Assessment Upper Extremity Assessment: Overall WFL for tasks assessed   Lower Extremity Assessment Lower Extremity Assessment: Defer to PT evaluation   Cervical / Trunk Assessment Cervical / Trunk Assessment: Normal   Communication Communication Communication: No difficulties   Cognition Arousal/Alertness: Awake/alert Behavior During Therapy: WFL for tasks assessed/performed Overall Cognitive Status: Within Functional Limits for tasks assessed  General Comments       Exercises     Shoulder Instructions      Home Living Family/patient expects to be discharged to:: Private residence Living Arrangements: Children   Type of Home: Apartment Home Access: Stairs to enter Technical brewer of Steps: 3 Entrance Stairs-Rails: Right Home Layout: One level     Bathroom Shower/Tub: Animal nutritionist: [place] - single point;Walker - 2 wheels          Prior Functioning/Environment Level of Independence: Independent        Comments: Pt was Ind with ADLs/selfcare, IADLs, home mg, cooking and was driving        OT Problem List: Impaired balance (sitting and/or standing);Pain;Decreased knowledge of use of DME or AE;Decreased activity tolerance      OT Treatment/Interventions: Self-care/ADL training;DME and/or AE instruction;Therapeutic activities;Balance training;Patient/family education    OT Goals(Current goals can be found in the care plan section) Acute Rehab OT Goals Patient Stated Goal: go home OT Goal Formulation: With patient Time For Goal Achievement: 08/14/20 ADL Goals Pt Will Perform Grooming: with supervision;with set-up;standing Pt Will Perform Lower Body Bathing: with min guard assist;with supervision;with set-up Pt Will Perform Lower Body Dressing: with min guard assist;with supervision;with set-up Pt Will Transfer to Toilet: with supervision;with modified independence;ambulating Pt Will Perform Toileting - Clothing Manipulation and hygiene: with supervision;with modified independence;sit to/from stand  OT Frequency: Min 2X/week   Barriers to D/C:            Co-evaluation              AM-PAC OT "6 Clicks" Daily Activity     Outcome Measure Help from another person eating meals?: None Help from another person taking care of personal grooming?: A Little Help from another person toileting, which includes using toliet, bedpan, or urinal?: A Little Help from another person bathing (including washing, rinsing, drying)?: A Little Help from another person to put on and taking off regular upper body clothing?: None Help from another person to put on and taking off regular lower body clothing?: A Little 6 Click Score: 20   End of Session Equipment Utilized During Treatment: Gait belt;Rolling walker;Other  (comment) (L post op shoe)  Activity Tolerance: Patient tolerated treatment well Patient left: in chair;with call bell/phone within reach  OT Visit Diagnosis: Other abnormalities of gait and mobility (R26.89);Unsteadiness on feet (R26.81);Pain Pain - Right/Left: Left Pain - part of body: Ankle and joints of foot                Time: 1010-1035 OT Time Calculation (min): 25 min Charges:  OT General Charges $OT Visit: 1 Visit OT Evaluation $OT Eval Moderate Complexity: 1 Mod OT Treatments $Self Care/Home Management : 8-22 mins    Britt Bottom 07/31/2020, 1:39 PM

## 2020-08-01 LAB — CBC WITH DIFFERENTIAL/PLATELET
Abs Immature Granulocytes: 0.04 10*3/uL (ref 0.00–0.07)
Basophils Absolute: 0 10*3/uL (ref 0.0–0.1)
Basophils Relative: 0 %
Eosinophils Absolute: 0.1 10*3/uL (ref 0.0–0.5)
Eosinophils Relative: 1 %
HCT: 33.2 % — ABNORMAL LOW (ref 39.0–52.0)
Hemoglobin: 11.4 g/dL — ABNORMAL LOW (ref 13.0–17.0)
Immature Granulocytes: 1 %
Lymphocytes Relative: 3 %
Lymphs Abs: 0.2 10*3/uL — ABNORMAL LOW (ref 0.7–4.0)
MCH: 28.1 pg (ref 26.0–34.0)
MCHC: 34.3 g/dL (ref 30.0–36.0)
MCV: 82 fL (ref 80.0–100.0)
Monocytes Absolute: 0.4 10*3/uL (ref 0.1–1.0)
Monocytes Relative: 8 %
Neutro Abs: 4.1 10*3/uL (ref 1.7–7.7)
Neutrophils Relative %: 87 %
Platelets: 199 10*3/uL (ref 150–400)
RBC: 4.05 MIL/uL — ABNORMAL LOW (ref 4.22–5.81)
RDW: 13.4 % (ref 11.5–15.5)
WBC: 4.7 10*3/uL (ref 4.0–10.5)
nRBC: 0 % (ref 0.0–0.2)

## 2020-08-01 LAB — GLUCOSE, CAPILLARY
Glucose-Capillary: 234 mg/dL — ABNORMAL HIGH (ref 70–99)
Glucose-Capillary: 186 mg/dL — ABNORMAL HIGH (ref 70–99)
Glucose-Capillary: 210 mg/dL — ABNORMAL HIGH (ref 70–99)
Glucose-Capillary: 107 mg/dL — ABNORMAL HIGH (ref 70–99)

## 2020-08-01 LAB — BASIC METABOLIC PANEL
Anion gap: 8 (ref 5–15)
BUN: 11 mg/dL (ref 8–23)
CO2: 22 mmol/L (ref 22–32)
Calcium: 8.3 mg/dL — ABNORMAL LOW (ref 8.9–10.3)
Chloride: 102 mmol/L (ref 98–111)
Creatinine, Ser: 0.74 mg/dL (ref 0.61–1.24)
GFR, Estimated: >60 mL/min (ref >60)
Glucose, Bld: 247 mg/dL — ABNORMAL HIGH (ref 70–99)
Potassium: 4.6 mmol/L (ref 3.5–5.1)
Sodium: 132 mmol/L — ABNORMAL LOW (ref 135–145)

## 2020-08-01 MED ORDER — INSULIN ASPART 100 UNIT/ML IJ SOLN
3.0000 [IU] | Freq: Three times a day (TID) | INTRAMUSCULAR | Status: DC
  Administered 2020-08-01 – 2020-08-02 (×3): 3 [IU] via SUBCUTANEOUS

## 2020-08-01 MED ORDER — APIXABAN 5 MG PO TABS
5.0000 mg | ORAL_TABLET | Freq: Two times a day (BID) | ORAL | Status: DC
  Administered 2020-08-01 – 2020-08-02 (×3): 5 mg via ORAL
  Filled 2020-08-01 (×3): qty 1

## 2020-08-01 MED ORDER — DOXYCYCLINE HYCLATE 100 MG PO TABS
100.0000 mg | ORAL_TABLET | Freq: Two times a day (BID) | ORAL | Status: DC
  Administered 2020-08-01 – 2020-08-02 (×3): 100 mg via ORAL
  Filled 2020-08-01 (×3): qty 1

## 2020-08-01 MED ORDER — INSULIN GLARGINE 100 UNIT/ML ~~LOC~~ SOLN
10.0000 [IU] | Freq: Every day | SUBCUTANEOUS | Status: DC
  Administered 2020-08-01: 10 [IU] via SUBCUTANEOUS
  Filled 2020-08-01 (×2): qty 0.1

## 2020-08-01 NOTE — Care Management (Signed)
Home Health (Order [id number]) Nursing Date: 07/31/2020 Department: [facility] Ordering: Jose Persia, MD Authorizing: [name], MD    [name], MD NPI: [id number]      Patient Information  Patient Name  Randall Coleman, Randall Coleman Legal Sex  Male DOB  1951-01-11 SSN  [id number]   Order Information  Order Date/Time Release Date/Time Start Date/Time End Date/Time  07/31/20 01:48 PM None 07/31/20 01:47 PM Until Specified   Order History Inpatient Date/Time Action Taken User Additional Information  07/31/20 1348 Sign Jose Persia, MD   07/31/20 1348 Release Instance Jose Persia, MD (auto-released) Released Order: [id number]  07/31/20 1352 Acknowledge Mikki Santee, RN New Order   Order Questions  Question Answer  To provide the following care/treatments PT   OT       Reference Links        Standing Order Information  Remaining Occurrences Interval Last Released    0/1 At discharge 07/31/2020

## 2020-08-01 NOTE — Progress Notes (Signed)
Subjective:  Mr. Geesey reports not feeling well this morning with sudden-onset nausea with one episode of watery emesis yesterday evening and multiple episodes of watery diarrhea throughout the night. No blood in the vomitus or diarrhea that he could tell. The diarrhea was not malodorous. He denies GI pain or cramping and is not having any fevers or chills. He reports poor sleep due to the GI upset and is tired this morning. He says he has not thought about his foot pain as the GI problems are his main concern right now. He did not eat breakfast due to nausea.   Objective:  Vital signs in last 24 hours: Vitals:   07/31/20 1700 07/31/20 2105 08/01/20 0449 08/01/20 1120  BP: 121/73 (!) 145/71 134/60 (!) 115/40  Pulse: 78 76 97 77  Resp: 17 16 17 18   Temp: 98.2 F (36.8 C) 98.3 F (36.8 C) 99.6 F (37.6 C) 98.4 F (36.9 C)  TempSrc:  Oral Oral Oral  SpO2: 98% 97% 97% 96%  Weight:      Height:       Weight change:   Intake/Output Summary (Last 24 hours) at 08/01/2020 1323 Last data filed at 08/01/2020 1244 Gross per 24 hour  Intake 1390.23 ml  Output 1028 ml  Net 362.23 ml   Labs: CBC Latest Ref Rng & Units 08/01/2020 07/30/2020 07/29/2020  WBC 4.0 - 10.5 K/uL 4.7 6.4 8.0  Hemoglobin 13.0 - 17.0 g/dL 11.4(L) 11.4(L) 12.5(L)  Hematocrit 39.0 - 52.0 % 33.2(L) 32.3(L) 36.0(L)  Platelets 150 - 400 K/uL 199 223 257   BMP Latest Ref Rng & Units 08/01/2020 07/31/2020 07/31/2020  Glucose 70 - 99 mg/dL 247(H) 218(H) 255(H)  BUN 8 - 23 mg/dL 11 11 9   Creatinine 0.61 - 1.24 mg/dL 0.74 0.71 0.75  Sodium 135 - 145 mmol/L 132(L) 130(L) 128(L)  Potassium 3.5 - 5.1 mmol/L 4.6 4.5 4.8  Chloride 98 - 111 mmol/L 102 96(L) 98  CO2 22 - 32 mmol/L 22 25 25   Calcium 8.9 - 10.3 mg/dL 8.3(L) 8.8(L) 8.3(L)   Physical Exam:  General: Tired-appearing gentleman in mild distress HENT: Normocephalic, atraumatic. Mucus membranes moist CV: Regular rate and rhythm. S1/S2 with no murmurs, rubs, or gallops Pulm:  Lungs CTAB. No wheezes or crackles. Breathing comfortably on room air Abd: Nontender, nondistended. Hyperactive bowel sounds  MSK: Left foot and lower extremity covered in clean, dry bandage with no gross swelling. LLE no longer hot to touch Neuro: Alert and grossly oriented. Moves all extremities spontaneously. No grossly apparent focal neurologic deficits Psych: Appropriate affect and thought process   Assessment/Plan:  Active Problems:   Acute osteomyelitis (HCC)   Osteomyelitis of great toe of left foot (HCC)   Abscess of left great toe  Mr. Himes is a 70 year old gentleman with a history of DM2, Afib on Eliquis, peripheral neuropathy, and DVT who was admitted for osteomyelitis of L great toe now s/p amputation.   #Diarrhea #Vomiting Significant GI symptoms overnight. Possibly due to side effects from meds. Could also be due to C.diff. Will treat with supportive care for now. Further work-up if GI sxs continue - Hold off on C.diff testing for now - Monitor stool output - Zofran prn  #s/p L great toe amputation for acute osteomyelitis Patient seems to be recovering well from his amputation on 6/1. Pain is under reasonable control with his current regimen. Will transition from IV vanc to po doxy today per surgery.  - Appreciate ongoing ortho involvement and recommendations -  Start doxycyline 100mg  po BID  - Continue Norco and dilaudid prn pain   #Hypotonic hypovolemic hyponatremia, improving Correct sodium 134 with hyperglycemia - Resolved  #DM2 Am cbg 234. 22 unit total insulin yesterday - Lantus 10 units - Novolog 3 units TID qc - SSI - C/w glucose checks  #A.fib CHADSVASC2 score of 3. On eliquis at home. HR77 - C/w eliquis 5mg  Bid  Code Status: FULL Fluids: None Diet: Regular with carb restriction  VTE ppx: Eliquis   LOS: 3 days   Mosetta Anis, MD 08/01/2020, 3:48 PM Pager: [phone] After 5pm on weekdays and 1pm on weekends: On Call Pager: [phone]

## 2020-08-01 NOTE — Progress Notes (Signed)
Inpatient Diabetes Program Recommendations  AACE/ADA: New Consensus Statement on Inpatient Glycemic Control   Target Ranges:  Prepandial:   less than 140 mg/dL      Peak postprandial:   less than 180 mg/dL (1-2 hours)      Critically ill patients:  140 - 180 mg/dL   Results for ASRIEL, WESTRUP (MRN [id number]) as of 08/01/2020 11:40  Ref. Range 07/31/2020 06:40 07/31/2020 11:36 07/31/2020 16:37 07/31/2020 21:31 08/01/2020 07:19 08/01/2020 11:18  Glucose-Capillary Latest Ref Range: 70 - 99 mg/dL 244 (H) 270 (H) 191 (H) 128 (H) 234 (H) 186 (H)   Review of Glycemic Control  Diabetes history: DM2 Outpatient Diabetes medications: Lantus 16 units QHS, Glipizide 2.5 mg BID, Glumetza 1000 mg BID Current orders for Inpatient glycemic control: Novolog 0-20 units TID with meals, Novolog 0-5 units QHS  Inpatient Diabetes Program Recommendations:    Insulin: Please consider ordering Lantus 10 units Q24H.  Thanks, Barnie Alderman, RN, MSN, CDE Diabetes Coordinator Inpatient Diabetes Program [phone] (Team Pager from 8am to 5pm)

## 2020-08-01 NOTE — Progress Notes (Signed)
Occupational Therapy Treatment Patient Details Name: Randall Coleman MRN: [id number] DOB: [birth year] Today's Date: 08/01/2020    History of present illness 70 year old male presents to the ED 5/31 with 4 days of worsening left great toe pain with swelling and purulent drainage. Admitted and is s/p 6/1 L great toe amputation.  PMH: DM2, Afib on Eliquis, neuropathy, and DVT.   OT comments  Pt. Seen for skilled OT treatment session.  Bed mobilty with S.  LB ADLS min guard seated.  Ambulation to/from b.room min guard, pt. Declined use of rw as he states he does not use DME at home. Continue with current acute OT goals next session with progression of ADLS.   Follow Up Recommendations  Home health OT    Equipment Recommendations  None recommended by OT    Recommendations for Other Services      Precautions / Restrictions Precautions Precautions: None Required Braces or Orthoses: Other Brace       Mobility Bed Mobility Overal bed mobility: Needs Assistance Bed Mobility: Supine to Sit;Sit to Sidelying     Supine to sit: Supervision   Sit to sidelying: Supervision      Transfers Overall transfer level: Needs assistance Equipment used: None Transfers: Sit to/from [facility] Sit to Stand: Min guard         General transfer comment: min guard for safety, verbal cues for hand placement-pt. declined rw "i dont walk with that"    Balance                                           ADL either performed or assessed with clinical judgement   ADL Overall ADL's : Needs assistance/impaired                     Lower Body Dressing: Minimal assistance;Min guard;Sitting/lateral leans Lower Body Dressing Details (indicate cue type and reason): donned LLE post op shoe Toilet Transfer: Min guard;Ambulation Toilet Transfer Details (indicate cue type and reason): amb. to b.room for simulated toilet transfer. pt. declined need for actual use and  also had previously uriniated on the floor in front of toilet and was cued by me not to ambulate on wet surface         Functional mobility during ADLs: Min guard General ADL Comments: pt. declined dme during ambulation.  educated to wear reg. shoe on r foot while wearing post op shoe on L for improving balance     Vision       Perception     Praxis      Cognition Arousal/Alertness: Awake/alert Behavior During Therapy: WFL for tasks assessed/performed Overall Cognitive Status: Within Functional Limits for tasks assessed                                          Exercises     Shoulder Instructions       General Comments      Pertinent Vitals/ Pain       Pain Assessment: No/denies pain  Home Living                                          Prior  Functioning/Environment              Frequency  Min 2X/week        Progress Toward Goals  OT Goals(current goals can now be found in the care plan section)        Plan      Co-evaluation                 AM-PAC OT "6 Clicks" Daily Activity     Outcome Measure   Help from another person eating meals?: None Help from another person taking care of personal grooming?: A Little Help from another person toileting, which includes using toliet, bedpan, or urinal?: A Little Help from another person bathing (including washing, rinsing, drying)?: A Little Help from another person to put on and taking off regular upper body clothing?: None Help from another person to put on and taking off regular lower body clothing?: A Little 6 Click Score: 20    End of Session Equipment Utilized During Treatment: Gait belt  OT Visit Diagnosis: Other abnormalities of gait and mobility (R26.89);Unsteadiness on feet (R26.81);Pain Pain - Right/Left: Left Pain - part of body: Ankle and joints of foot   Activity Tolerance Patient tolerated treatment well   Patient Left in bed;with call  bell/phone within reach;with bed alarm set   Nurse Communication Other (comment) (spoke with rn and she states ok to give crackers and ginger ale for pts. upset stomach (reports of loose stools))        Time: 1004-1016 OT Time Calculation (min): 12 min  Charges: OT General Charges $OT Visit: 1 Visit OT Treatments $Self Care/Home Management : 8-22 mins  Sonia Baller, COTA/L Acute Rehabilitation [phone]   Clearnce Sorrel  08/01/2020, 11:56 AM

## 2020-08-01 NOTE — TOC Progression Note (Signed)
Transition of Care [facility]) - Progression Note    Patient Details  Name: Randall Coleman MRN: [id number] Date of Birth: [birth year]  Transition of Care [facility]) CM/SW [place], RN Phone Number: 08/01/2020, 3:48 PM  Clinical Narrative:     [facility] accepted for [facility] PT OT.        Expected Discharge Plan and Services     Discharge Planning Services: CM Consult   Living arrangements for the past 2 months: [place]: PT,OT [place]: [facility] Date [place]: 08/01/20 Time HH Agency Contacted: 1500 Representative spoke with at [place]: [place]) Interventions    Readmission Risk Interventions No flowsheet data found.

## 2020-08-01 NOTE — Progress Notes (Signed)
Patient is lying in bed and states he had a rough night.  Had nausea vomiting and diarrhea.  He was up a little bit yesterday.  Per his report.  Dressing is clean dry intact.  No drainage visible toes are warm and pink.  Will need 1 week follow-up in our office have recommended and placed an order for 1 month of doxycycline

## 2020-08-01 NOTE — TOC Progression Note (Addendum)
Transition of Care [facility]) - Progression Note    Patient Details  Name: Randall Coleman MRN: [id number] Date of Birth: 1950/06/02  Transition of Care [facility]) CM/SW [place], RN Phone Number: 08/01/2020, 10:47 AM  Clinical Narrative:     Called patient to set up [facility] for DC  And to see who his PCP was at the [place]. Patient states he is sick, throwing up and wishes me to call back later. CM will follow  1300 called patient back to discuss discharge plans. Patient states he lives alone and has nobody to call on . Spoke to [employer] about which insurance he would rather use for [facility] " It doesn't matter" he wants to have a [place] agency that works with his insurance, spoke about [email or web address], patient  May have a walker at home, but not sure from "long ago"  Spoke to [place] at [employer], faxed H&P, PT OT notes, and order for Home Health to PCP Dr. Ronnald Ramp [phone]  Tarri Glenn to assess patient for [facility]       Expected Discharge Plan and Services                                                 Social Determinants of Health (SDOH) Interventions    Readmission Risk Interventions No flowsheet data found.

## 2020-08-01 NOTE — Progress Notes (Signed)
Physical Therapy Treatment Patient Details Name: Randall Coleman MRN: [id number] DOB: 28-Apr-1950 Today's Date: 08/01/2020    History of Present Illness 70 year old male presents to the ED 5/31 with 4 days of worsening left great toe pain with swelling and purulent drainage. Admitted and is s/p 6/1 L great toe amputation.  PMH: DM2, Afib on Eliquis, neuropathy, and DVT.    PT Comments    Pt reports he is waiting on pain and anti-nausea medicine on entry. While waiting on RN educated pt on need for RW use not only for steadying but for protecting his L foot. Pt agreeable to use of RW while foot heals. Pt is limited in safe mobility by decreased safety awareness and knowledge of deficits in presence of L foot pain. Pt is supervision for bed mobility, min guard for transfers and contact guard assist for ambulation with RW. D/c plan remains appropriate at this time. PT will continue to follow acutely.   Follow Up Recommendations  Home health PT;Supervision for mobility/OOB     Equipment Recommendations  None recommended by PT (has necessary equipment)       Precautions / Restrictions Precautions Precautions: None Required Braces or Orthoses: Other Brace (Post op shoe) Restrictions Weight Bearing Restrictions: No    Mobility  Bed Mobility Overal bed mobility: Needs Assistance Bed Mobility: Supine to Sit;Sit to Supine     Supine to sit: Supervision Sit to supine: Supervision   General bed mobility comments: supervision for safety, HoB elevated    Transfers Overall transfer level: Needs assistance Equipment used: Rolling walker (2 wheeled) Transfers: Sit to/from Stand Sit to Stand: Min guard         General transfer comment: min guard for safety, vc for hand placement for power up and steadying  Ambulation/Gait Ambulation/Gait assistance: Min assist Gait Distance (Feet): 60 Feet Assistive device: Rolling walker (2 wheeled) Gait Pattern/deviations: Step-to pattern;Decreased  step length - right;Decreased step length - left;Decreased weight shift to left;Decreased stance time - right;Antalgic;Trunk flexed Gait velocity: slowed Gait velocity interpretation: <1.31 ft/sec, indicative of household ambulator General Gait Details: contact guard assist for safety, vc for proximity to RW and upright posture, initially very halting gait, more fluidity with distance       Balance Overall balance assessment: Needs assistance Sitting-balance support: Feet supported;No upper extremity supported Sitting balance-Leahy Scale: Good     Standing balance support: During functional activity;No upper extremity supported Standing balance-Leahy Scale: Fair Standing balance comment: able to stand at toilet and urinate without UE support                            Cognition Arousal/Alertness: Awake/alert Behavior During Therapy: WFL for tasks assessed/performed Overall Cognitive Status: Within Functional Limits for tasks assessed                                        Exercises General Exercises - Lower Extremity Ankle Circles/Pumps: AROM;Both;20 reps;Seated    General Comments General comments (skin integrity, edema, etc.): VSS on RA      Pertinent Vitals/Pain Pain Assessment: Faces Faces Pain Scale: Hurts even more Pain Location: L foot pain Pain Descriptors / Indicators: Numbness;Aching;Shooting;Sharp Pain Intervention(s): Limited activity within patient's tolerance;Monitored during session;Repositioned;Premedicated before session           PT Goals (current goals can now be found in the care plan section)  Acute Rehab PT Goals Patient Stated Goal: get back home PT Goal Formulation: With patient Time For Goal Achievement: 08/14/20 Potential to Achieve Goals: Good Progress towards PT goals: Progressing toward goals    Frequency    Min 3X/week      PT Plan Current plan remains appropriate       AM-PAC PT "6 Clicks"  Mobility   Outcome Measure  Help needed turning from your back to your side while in a flat bed without using bedrails?: None Help needed moving from lying on your back to sitting on the side of a flat bed without using bedrails?: None Help needed moving to and from a bed to a chair (including a wheelchair)?: A Little Help needed standing up from a chair using your arms (e.g., wheelchair or bedside chair)?: A Little Help needed to walk in hospital room?: A Little Help needed climbing 3-5 steps with a railing? : A Lot 6 Click Score: 19    End of Session Equipment Utilized During Treatment: Gait belt Activity Tolerance: Patient limited by pain Patient left: in chair;with call bell/phone within reach;with chair alarm set Nurse Communication: Mobility status;Patient requests pain meds PT Visit Diagnosis: Other abnormalities of gait and mobility (R26.89);Difficulty in walking, not elsewhere classified (R26.2);Pain Pain - Right/Left: Left Pain - part of body: Ankle and joints of foot     Time: 1540-1601 PT Time Calculation (min) (ACUTE ONLY): 21 min  Charges:  $Gait Training: 8-22 mins                     Tericka Devincenzi B. Migdalia Dk PT, DPT Acute Rehabilitation Services Pager [phone] Office [phone]    [place] 08/01/2020, 4:06 PM

## 2020-08-02 DIAGNOSIS — E119 Type 2 diabetes mellitus without complications: Secondary | ICD-10-CM

## 2020-08-02 DIAGNOSIS — M86072 Acute hematogenous osteomyelitis, left ankle and foot: Secondary | ICD-10-CM

## 2020-08-02 LAB — CBC
HCT: 30.1 % — ABNORMAL LOW (ref 39.0–52.0)
Hemoglobin: 10.4 g/dL — ABNORMAL LOW (ref 13.0–17.0)
MCH: 28.4 pg (ref 26.0–34.0)
MCHC: 34.6 g/dL (ref 30.0–36.0)
MCV: 82.2 fL (ref 80.0–100.0)
Platelets: 194 10*3/uL (ref 150–400)
RBC: 3.66 MIL/uL — ABNORMAL LOW (ref 4.22–5.81)
RDW: 13.3 % (ref 11.5–15.5)
WBC: 3.3 10*3/uL — ABNORMAL LOW (ref 4.0–10.5)
nRBC: 0 % (ref 0.0–0.2)

## 2020-08-02 LAB — BASIC METABOLIC PANEL
Anion gap: 5 (ref 5–15)
BUN: 8 mg/dL (ref 8–23)
CO2: 24 mmol/L (ref 22–32)
Calcium: 8.1 mg/dL — ABNORMAL LOW (ref 8.9–10.3)
Chloride: 102 mmol/L (ref 98–111)
Creatinine, Ser: 0.64 mg/dL (ref 0.61–1.24)
GFR, Estimated: >60 mL/min (ref >60)
Glucose, Bld: 213 mg/dL — ABNORMAL HIGH (ref 70–99)
Potassium: 3.9 mmol/L (ref 3.5–5.1)
Sodium: 131 mmol/L — ABNORMAL LOW (ref 135–145)

## 2020-08-02 LAB — GLUCOSE, CAPILLARY
Glucose-Capillary: 204 mg/dL — ABNORMAL HIGH (ref 70–99)
Glucose-Capillary: 171 mg/dL — ABNORMAL HIGH (ref 70–99)

## 2020-08-02 MED ORDER — HYDROCODONE-ACETAMINOPHEN 5-325 MG PO TABS: 1.0000 | ORAL_TABLET | Freq: Four times a day (QID) | ORAL | 0 refills | Status: AC | PRN

## 2020-08-02 NOTE — Discharge Summary (Signed)
Name: Randall Coleman MRN: [id number] DOB: 09/18/1950 70 y.o. PCP: Pcp, No  Date of Admission: 07/29/2020 11:43 AM Date of Discharge: 08/02/2020 Attending Physician: Dr. Gilles Chiquito  Discharge Diagnosis: 1. Acute Osteomyelitis of the left great toe s/p amputation  2. Hypotonic Hyponatremia  3. Type 2 Diabetes   Discharge Medications: Allergies as of 08/02/2020   No Known Allergies     Medication List    STOP taking these medications   buPROPion 150 MG 24 hr tablet Commonly known as: WELLBUTRIN XL   busPIRone 15 MG tablet Commonly known as: BUSPAR   insulin glargine 100 UNIT/ML injection Commonly known as: LANTUS   propranolol 10 MG tablet Commonly known as: INDERAL     TAKE these medications   apixaban 5 MG Tabs tablet Commonly known as: ELIQUIS Take 5 mg by mouth 2 (two) times daily.   ARIPiprazole 20 MG tablet Commonly known as: ABILIFY Take 0.5 tablets (10 mg total) by mouth daily.   aspirin EC 81 MG tablet Take 81 mg by mouth daily.   benztropine 1 MG tablet Commonly known as: COGENTIN Take 1 mg by mouth daily.   doxycycline 100 MG tablet Commonly known as: VIBRA-TABS Take 1 tablet (100 mg total) by mouth 2 (two) times daily.   gabapentin 300 MG capsule Commonly known as: NEURONTIN Take 300 mg by mouth 3 (three) times daily.   glipiZIDE 5 MG tablet Commonly known as: GLUCOTROL Take 2.5 mg by mouth 2 (two) times daily.   HYDROcodone-acetaminophen 5-325 MG tablet Commonly known as: NORCO/VICODIN Take 1 tablet by mouth every 6 (six) hours as needed for up to 5 days for severe pain.   lisinopril 10 MG tablet Commonly known as: ZESTRIL Take 1 tablet (10 mg total) by mouth daily.   metFORMIN 1000 MG (MOD) 24 hr tablet Commonly known as: GLUMETZA Take 1,000 mg by mouth 2 (two) times daily.   Multi-Vitamin tablet Take 1 tablet by mouth daily.   nicotine 21 mg/24hr patch Commonly known as: NICODERM CQ - dosed in mg/24 hours Place 1 patch (21 mg  total) onto the skin daily.   omeprazole 20 MG capsule Commonly known as: PRILOSEC Take 20 mg by mouth 2 (two) times daily.   traZODone 100 MG tablet Commonly known as: DESYREL Take 1 tablet (100 mg total) by mouth at bedtime.   VITAMIN B 12 PO Take 1 tablet by mouth daily.            Discharge Care Instructions  (From admission, onward)         Start     Ordered   08/02/20 0000  Discharge wound care:       Comments: Dry dressing changes per Orthopedic Surgery   08/02/20 1432          Disposition and follow-up:   [name] was discharged from [facility] in Good condition.  At the hospital follow up visit please address:  1.    Please ensure patient follows up with Orthopedic surgery Assess if patient was able to complete antibiotic course  Please optimize diabetes medication regimen  2.  Labs / imaging needed at time of follow-up: CBC. BMP  3.  Pending labs/ test needing follow-up: N/A  Follow-up Appointments:  Follow-up Information    Persons, Bevely Palmer, [place] In 1 week.   Specialty: Orthopedic Surgery Contact information: [address] [place] [address] [phone]  Hospital Course by problem list: 1. Acute Osteomyelitis of the left great toe s/p amputation  Patient presented to [facility] ED with complaints of left great toe pain with swelling and purulent drainage.  He noted symptoms had only begun 3 days prior to presentation.  Patient was initially afebrile with no evidence of sepsis.  White blood count was within normal limits.  X-ray showed bony destructive findings in the left distal phalanx consistent with osteomyelitis.  Patient was started on IV Vanco and Zosyn and orthopedic surgery was consulted.  On 07/30/2020 patient underwent amputation of the left great toe.  Orthopedic surgery recommended 1 month of doxycycline and probiotics.  Patient did experience some upset stomach with nausea and  diarrhea after starting doxycycline but symptoms improved spontaneously.  Patient worked with physical therapy and Occupational Therapy who recommended home health.  Patient was discharged in stable condition with 5 days of as needed Norco.  2. Hypotonic Hyponatremia  On admission, patient's sodium was 122.  He was asymptomatic without any neurological deficits.  Urine sodium and osmolality was obtained however unfortunately after patient already received half liter bolus of IV fluids.  Urine sodium was less than 10 and urine osmolality was 149, consistent with hypovolemic hypotonic hyponatremia in the setting of ongoing infection.  Patient was initially treated with IV normal saline with improvement.  After amputation and resumption of diet, sodium continue to improve.  No further intervention at this time.  3. Type 2 Diabetes Patient reports a long-term history of diabetes for which she is on metformin and glipizide [facility].  A1c on admission was obtained and elevated at 8.0%.  During admission patient was noted to be significantly hyperglycemic up to the 300s likely in the setting of acute infection.  He was treated with sliding scale insulin.  On discharge, he was instructed to resume his home metformin and glipizide.  Subjective: No acute complaints this AM other than an upset stomach. Patient is in agreement for discharge.   Discharge Exam:   BP 120/68   Pulse 80   Temp 98.4 F (36.9 C) (Oral)   Resp 17   Ht 6\' 2"  (1.88 m)   Wt 97.1 kg   SpO2 99%   BMI 27.48 kg/m  Discharge exam:  Physical Exam Vitals and nursing note reviewed.  Constitutional:      General: He is not in acute distress.    Appearance: He is normal weight.  Cardiovascular:     Rate and Rhythm: Normal rate and regular rhythm.     Heart sounds: No murmur heard.   Pulmonary:     Effort: Pulmonary effort is normal. No respiratory distress.     Breath sounds: Normal breath sounds. No wheezing or rales.   Abdominal:     General: Bowel sounds are normal.     Palpations: Abdomen is soft.  Musculoskeletal:     Right lower leg: No edema.     Left lower leg: No edema.  Skin:    General: Skin is warm and dry.  Neurological:     General: No focal deficit present.     Mental Status: He is alert and oriented to person, place, and time. Mental status is at baseline.  Psychiatric:        Mood and Affect: Mood normal.        Behavior: Behavior normal.        Thought Content: Thought content normal.        Judgment: Judgment  normal.    Pertinent Labs, Studies, and Procedures:  CBC Latest Ref Rng & Units 08/02/2020 08/01/2020 07/30/2020  WBC 4.0 - 10.5 K/uL 3.3(L) 4.7 6.4  Hemoglobin 13.0 - 17.0 g/dL 10.4(L) 11.4(L) 11.4(L)  Hematocrit 39.0 - 52.0 % 30.1(L) 33.2(L) 32.3(L)  Platelets 150 - 400 K/uL 194 199 223   CMP Latest Ref Rng & Units 08/02/2020 08/01/2020 07/31/2020  Glucose 70 - 99 mg/dL 213(H) 247(H) 218(H)  BUN 8 - 23 mg/dL 8 11 11   Creatinine 0.61 - 1.24 mg/dL 0.64 0.74 0.71  Sodium 135 - 145 mmol/L 131(L) 132(L) 130(L)  Potassium 3.5 - 5.1 mmol/L 3.9 4.6 4.5  Chloride 98 - 111 mmol/L 102 102 96(L)  CO2 22 - 32 mmol/L 24 22 25   Calcium 8.9 - 10.3 mg/dL 8.1(L) 8.3(L) 8.8(L)  Total Protein 6.5 - 8.1 g/dL - - -  Total Bilirubin 0.3 - 1.2 mg/dL - - -  Alkaline Phos 38 - 126 U/L - - -  AST 15 - 41 U/L - - -  ALT 0 - 44 U/L - - -   A1c: 8.0%  Left Foot Xray:  IMPRESSION: 1. Bony destructive findings in the distal phalanx great toe with local signs of soft tissue infection. The appearance is compatible with active osteomyelitis of the great toe. 2. Plantar calcaneal spur. 3. Mild dorsal spurring at the talonavicular articulation.  Discharge Instructions: Discharge Instructions    Call MD for:  extreme fatigue   Complete by: As directed    Call MD for:  persistant dizziness or light-headedness   Complete by: As directed    Call MD for:  persistant nausea and vomiting   Complete by: As  directed    Call MD for:  redness, tenderness, or signs of infection (pain, swelling, redness, odor or green/yellow discharge around incision site)   Complete by: As directed    Call MD for:  severe uncontrolled pain   Complete by: As directed    Call MD for:  temperature >100.4   Complete by: As directed    Diet - low sodium heart healthy   Complete by: As directed    Discharge instructions   Complete by: As directed    Mr. Cyler Kappes were admitted to the hospital due to an infection of the bone called osteomyelitis. For this, you were started on IV antibiotics, as well as had an amputation of your left great toe. Orthopedic surgery has recommended you follow up in their office in 1 week.   You are being discharged with two new medications:   1) Doxycycline - This is an antibiotic to take twice per day for the next 1 month 2) Hydrocodone-Acetaminophen (Norco) - This is a pain pill that you can take every 6 hours, as needed, for severe pain. This is only a short term medication for the next 5 days.   Please make sure to follow up with your doctors at the [place] in the next 7-10 days.   It was a pleasure meeting you and we wish you the best!   - Dr. Charleen Kirks   Discharge wound care:   Complete by: As directed    Dry dressing changes per Orthopedic Surgery   Increase activity slowly   Complete by: As directed       Signed: Dr. Jose Persia Internal Medicine PGY-2  Pager: [phone] After 5pm on weekdays and 1pm on weekends: On Call pager [phone]  08/02/2020, 2:32 PM

## 2020-08-02 NOTE — Progress Notes (Signed)
SWAT RN: Patient given discharge instructions and verbalizes understanding. CAB called for patient. Will take patient out via [place].

## 2020-08-03 LAB — CULTURE, BLOOD (ROUTINE X 2)
Culture: NO GROWTH
Culture: NO GROWTH
Special Requests: ADEQUATE

## 2020-08-05 ENCOUNTER — Ambulatory Visit: Payer: No Typology Code available for payment source | Admitting: Family

## 2020-08-05 ENCOUNTER — Telehealth: Payer: Self-pay | Admitting: Orthopedic Surgery

## 2020-08-05 NOTE — Telephone Encounter (Signed)
Pt and home health called and wanted to verify with the nurse what the nursing orders are, limitations, etc. Got pt scheduled with Sharol Given for a follow up on 08/07/20. The best call back number is [phone].

## 2020-08-05 NOTE — Telephone Encounter (Signed)
Called and sw HHN to advise that the pt is s/p a GT amputation and that he should leave surgical dressing intact until post op appt. She states that the pt is not wearing a dressing he removed and is wearing a sock and it is stuck to his incision. I advised that I would call the pt and move the appt up from Thursday to tomorrow and will hold this message pending eval and call with any orders.

## 2020-08-07 ENCOUNTER — Telehealth: Payer: Self-pay

## 2020-08-07 ENCOUNTER — Ambulatory Visit: Payer: No Typology Code available for payment source | Admitting: Orthopedic Surgery

## 2020-08-07 ENCOUNTER — Encounter: Payer: No Typology Code available for payment source | Admitting: Orthopedic Surgery

## 2020-08-07 NOTE — Telephone Encounter (Signed)
Elmyra Ricks with [facility] home health would like verbal orders for evaluation for a Education officer, museum and a Marine scientist.  CB# [phone].  Please advise.  Thank you.

## 2020-08-07 NOTE — Telephone Encounter (Signed)
Can you please call this pt and make an appt for tomorrow or early next week. He is a GT amputation and NS his work in appt on Wednesday and CX his appt for today with nothing scheduled in follow up.

## 2020-08-07 NOTE — Telephone Encounter (Signed)
Appt made for 08/11/20

## 2020-08-08 ENCOUNTER — Encounter: Payer: No Typology Code available for payment source | Admitting: Family

## 2020-08-08 NOTE — Telephone Encounter (Signed)
Pt scheduled for 9:15 monday

## 2020-08-08 NOTE — Telephone Encounter (Signed)
Yes for those orders needs follow up

## 2020-08-08 NOTE — Telephone Encounter (Signed)
Are these orders okay?

## 2020-08-08 NOTE — Telephone Encounter (Signed)
I called and lm on vm to advise HHN ok for orders below.  The pt has been a NS to appt every day since Wednesday. Can you please call pt and make an appt with Dr. Sharol Given for Monday.

## 2020-08-11 ENCOUNTER — Encounter: Payer: No Typology Code available for payment source | Admitting: Orthopedic Surgery

## 2020-08-11 NOTE — Telephone Encounter (Signed)
Called pt and was sent to VM which was full; so I also tried his emergency contacts and those numbers went to VM as well but I was able to leave a message.

## 2020-08-11 NOTE — Telephone Encounter (Signed)
I called [phone] and Sw Elmyra Ricks to advise that the pt did not keep his appt today. She had arranged for transport to his office visit and does not know why he did not keep appt. She will communicate this to the [facility] taking care of the pt and she will also send me a picture of the incision from last week. They will encourage pt to keep his appt and we will reach out to reschedule.

## 2020-08-11 NOTE — Telephone Encounter (Signed)
Can you please reach out to pt and make an appt for this week? He has cx 5 office visits and we need to see him. If he needs assistance with transportation please let me know and I can arrange this for him through [facility].

## 2020-08-14 ENCOUNTER — Ambulatory Visit (INDEPENDENT_AMBULATORY_CARE_PROVIDER_SITE_OTHER): Payer: Medicare Other | Admitting: Physician Assistant

## 2020-08-14 ENCOUNTER — Other Ambulatory Visit: Payer: Self-pay

## 2020-08-14 ENCOUNTER — Encounter: Payer: Self-pay | Admitting: Orthopedic Surgery

## 2020-08-14 ENCOUNTER — Encounter: Payer: No Typology Code available for payment source | Admitting: Orthopedic Surgery

## 2020-08-14 ENCOUNTER — Telehealth: Payer: Self-pay

## 2020-08-14 DIAGNOSIS — M869 Osteomyelitis, unspecified: Secondary | ICD-10-CM

## 2020-08-14 NOTE — Telephone Encounter (Signed)
I called and sw HHN to advise that I would call the pt and see if he is agreeable to having transportation set up for appt today with Dr. Sharol Given. He has NS or CX 7 appts since surgery and given the concerns for dehiscence or possible infection he should come in today. I will hold this message to keep [facility] aware. I tried to call the pt x 2 there is no answer and the mailbox is full. I will hold this message and continue to try and reach out to sch appt.

## 2020-08-14 NOTE — Telephone Encounter (Signed)
Estill Bamberg from [facility] called regarding patients appointment that was scheduled for this morning Estill Bamberg stated the patient overslept, Estill Bamberg stated his foot is swollen she is requesting the patient to be worked into the schedule tomorrow if possible with mary anne call back:[phone]

## 2020-08-14 NOTE — Progress Notes (Signed)
Office Visit Note   Patient: Randall Coleman           Date of Birth: 1950/08/13           MRN: [id number] Visit Date: 08/14/2020              Requested by: No referring provider defined for this encounter. PCP: Pcp, No  Chief Complaint  Patient presents with   Left Foot - Routine Post Op    07/30/20 left GT amputation       HPI: Patient is 15 days status post left great toe amputation.  He has had difficulty making his follow-up because of transportation.  He presents today for his first follow-up he is feeling well and taking doxycycline.  He does have some mild stomach issues  Assessment & Plan: Visit Diagnoses: No diagnosis found.  Plan: I have written down on paper for the patient to obtain a probiotic to take with the doxycycline since he is going to be on this a little bit longer.  Should continue to offload the foot.  He should cleanse the foot daily and apply new dressing I gave him some dressing supplies as he does not have any at home follow-up in 1 week.  He understands how to make an appointment and to get a ride  Follow-Up Instructions: No follow-ups on file.   Ortho Exam  Patient is alert, oriented, no adenopathy, well-dressed, normal affect, normal respiratory effort. Overall swelling is well controlled.  Well apposed wound edges just some slight superficial necrosis he has mild erythema on the great toe but no sausage digit swelling no open sores no ascending cellulitis  Imaging: No results found.   Labs: Lab Results  Component Value Date   HGBA1C 8.0 (H) 07/29/2020   HGBA1C 7.3 (H) 12/10/2018   REPTSTATUS 08/03/2020 FINAL 07/29/2020   CULT  07/29/2020    NO GROWTH 5 DAYS Performed at [facility], [place] [address], [place], [place] [address]      Lab Results  Component Value Date   ALBUMIN 4.2 12/09/2018    No results found for: MG No results found for: VD25OH  No results found for: PREALBUMIN CBC EXTENDED Latest Ref Rng & Units 08/02/2020  08/01/2020 07/30/2020  WBC 4.0 - 10.5 K/uL 3.3(L) 4.7 6.4  RBC 4.22 - 5.81 MIL/uL 3.66(L) 4.05(L) 3.96(L)  HGB 13.0 - 17.0 g/dL 10.4(L) 11.4(L) 11.4(L)  HCT 39.0 - 52.0 % 30.1(L) 33.2(L) 32.3(L)  PLT 150 - 400 K/uL 194 199 223  NEUTROABS 1.7 - 7.7 K/uL - 4.1 4.7  LYMPHSABS 0.7 - 4.0 K/uL - 0.2(L) 0.9     There is no height or weight on file to calculate BMI.  Orders:  No orders of the defined types were placed in this encounter.  No orders of the defined types were placed in this encounter.    Procedures: No procedures performed  Clinical Data: No additional findings.  ROS:  All other systems negative, except as noted in the HPI. Review of Systems  Objective: Vital Signs: There were no vitals taken for this visit.  Specialty Comments:  No specialty comments available.  PMFS History: Patient Active Problem List   Diagnosis Date Noted   Osteomyelitis of great toe of left foot ([place])    Abscess of left great toe    Acute osteomyelitis ([place]) 07/29/2020   Major depressive disorder 12/15/2018   Anxiety 12/15/2018   Foot fracture, right 12/11/2018   Foreign body in lung 12/10/2018  Syncope and collapse 12/10/2018   Foot injury, right, initial encounter 12/10/2018   Essential hypertension 12/10/2018   History of DVT of lower extremity 12/10/2018   Type 2 diabetes mellitus without complication ([place]) 33/35/4562   Unspecified atrial fibrillation ([place]) 12/10/2018   Past Medical History:  Diagnosis Date   Diabetes mellitus without complication ([place])    DVT (deep venous thrombosis) ([place])    Hypertension     No family history on file.  Past Surgical History:  Procedure Laterality Date   AMPUTATION Left 07/30/2020   Procedure: AMPUTATION LEFT GREAT TOE;  Surgeon: [name], MD;  Location: [place];  Service: Orthopedics;  Laterality: Left;   HERNIA REPAIR     HERNIA REPAIR     IR CHEST FLUORO  12/13/2018   Social History   Occupational History   Occupation: retired   Tobacco Use   Smoking status: Every Day    Packs/day: 1.00    Years: 54.00    Pack years: 54.00    Types: Cigarettes   Smokeless tobacco: Never  Substance and Sexual Activity   Alcohol use: Not Currently    Comment: h/o heavy use, quit about 10 years ago   Drug use: Never    Comment: used when he was younger   Sexual activity: Not Currently

## 2020-08-14 NOTE — Telephone Encounter (Signed)
Multiple attempts to contact pt. Front desk was able to reach and pt is aware coming in now.

## 2020-08-14 NOTE — Telephone Encounter (Signed)
I called and sw Estill Bamberg and advised that we saw the pt in the office and that the incision is ok to continue with dry dressing or compression sock and advised PA had wanted him to start a probiotic due to the fact that he will be on doxy bid for several weeks and to just follow up with the pt that he is taking this. To call with any questions.

## 2020-08-21 ENCOUNTER — Ambulatory Visit: Payer: No Typology Code available for payment source | Admitting: Orthopedic Surgery

## 2020-09-16 ENCOUNTER — Encounter: Payer: No Typology Code available for payment source | Admitting: Physician Assistant

## 2020-09-29 ENCOUNTER — Ambulatory Visit (INDEPENDENT_AMBULATORY_CARE_PROVIDER_SITE_OTHER): Payer: Medicare Other | Admitting: Orthopedic Surgery

## 2020-09-29 ENCOUNTER — Encounter: Payer: Self-pay | Admitting: Orthopedic Surgery

## 2020-09-29 VITALS — Ht 74.0 in | Wt 214.0 lb

## 2020-09-29 DIAGNOSIS — Z89412 Acquired absence of left great toe: Secondary | ICD-10-CM

## 2020-09-29 MED ORDER — HYDROCODONE-ACETAMINOPHEN 5-325 MG PO TABS: 1.0000 | ORAL_TABLET | Freq: Four times a day (QID) | ORAL | 0 refills | Status: DC | PRN

## 2020-09-29 NOTE — Progress Notes (Signed)
Office Visit Note   Patient: Randall Coleman           Date of Birth: [birth year]           MRN: [id number] Visit Date: 09/29/2020              Requested by: No referring provider defined for this encounter. PCP: Pcp, No  Chief Complaint  Patient presents with   Left Foot - Follow-up    Left great toe amputation 07/30/2020      HPI: Patient is a 70 year old gentleman is 2 months status post left great toe amputation patient states he still has pain 7 out of 10 denies any drainage he states he is wearing compression socks.  Assessment & Plan: Visit Diagnoses: No diagnosis found.  Plan: Sutures were harvested he will continue with the compression socks prescription for Vicodin called in.  Follow-Up Instructions: Return in about 4 weeks (around 10/27/2020).   Ortho Exam  Patient is alert, oriented, no adenopathy, well-dressed, normal affect, normal respiratory effort. Examination patient has increased callus formation over the amputation site this was debrided the sutures were removed there is good epithelization no redness no cellulitis no drainage no tenderness to palpation.  Imaging: No results found. No images are attached to the encounter.  Labs: Lab Results  Component Value Date   HGBA1C 8.0 (H) 07/29/2020   HGBA1C 7.3 (H) 12/10/2018   REPTSTATUS 08/03/2020 FINAL 07/29/2020   CULT  07/29/2020    NO GROWTH 5 DAYS Performed at [facility], [place] [address], [place], [place] [address]      Lab Results  Component Value Date   ALBUMIN 4.2 12/09/2018    No results found for: MG No results found for: VD25OH  No results found for: PREALBUMIN CBC EXTENDED Latest Ref Rng & Units 08/02/2020 08/01/2020 07/30/2020  WBC 4.0 - 10.5 K/uL 3.3(L) 4.7 6.4  RBC 4.22 - 5.81 MIL/uL 3.66(L) 4.05(L) 3.96(L)  HGB 13.0 - 17.0 g/dL 10.4(L) 11.4(L) 11.4(L)  HCT 39.0 - 52.0 % 30.1(L) 33.2(L) 32.3(L)  PLT 150 - 400 K/uL 194 199 223  NEUTROABS 1.7 - 7.7 K/uL - 4.1 4.7  LYMPHSABS 0.7  - 4.0 K/uL - 0.2(L) 0.9     Body mass index is 27.48 kg/m.  Orders:  No orders of the defined types were placed in this encounter.  No orders of the defined types were placed in this encounter.    Procedures: No procedures performed  Clinical Data: No additional findings.  ROS:  All other systems negative, except as noted in the HPI. Review of Systems  Objective: Vital Signs: Ht '6\' 2"'$  (1.88 m)   Wt 214 lb (97.1 kg)   BMI 27.48 kg/m   Specialty Comments:  No specialty comments available.  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 No pertinent family history.  Past Surgical History:  Procedure Laterality Date   AMPUTATION Left 07/30/2020   Procedure: AMPUTATION LEFT GREAT TOE;  Surgeon: [name], MD;  Location: [facility];  Service: Orthopedics;  Laterality: Left;   HERNIA REPAIR     HERNIA REPAIR     IR CHEST FLUORO  12/13/2018   Social History   Occupational History   Occupation: retired  Tobacco Use   Smoking status: Every Day    Packs/day: 1.00    Years: 54.00    Pack years: 54.00    Types: Cigarettes   Smokeless tobacco: Never  Substance and Sexual Activity   Alcohol use: Not Currently    Comment: h/o heavy use, quit about 10 years ago    Drug use: Never    Comment: used when he was younger   Sexual activity: Not Currently

## 2020-09-30 ENCOUNTER — Telehealth: Payer: Self-pay | Admitting: Orthopedic Surgery

## 2020-09-30 NOTE — Telephone Encounter (Signed)
Patient called advised the Rx Hydrocodone is not showing at the pharmacy. The number to contact patient is [phone]

## 2020-09-30 NOTE — Telephone Encounter (Signed)
Patient aware ready at pharmacy

## 2020-10-27 ENCOUNTER — Ambulatory Visit: Payer: No Typology Code available for payment source | Admitting: Orthopedic Surgery

## 2021-04-06 ENCOUNTER — Encounter (HOSPITAL_COMMUNITY): Payer: Self-pay

## 2021-04-06 ENCOUNTER — Emergency Department (HOSPITAL_COMMUNITY): Payer: No Typology Code available for payment source

## 2021-04-06 ENCOUNTER — Inpatient Hospital Stay (HOSPITAL_COMMUNITY)
Admission: EM | Admit: 2021-04-06 | Discharge: 2021-04-10 | DRG: 240 | Disposition: A | Discharge status: Home Health | Payer: No Typology Code available for payment source | Attending: Internal Medicine | Admitting: Internal Medicine

## 2021-04-06 ENCOUNTER — Other Ambulatory Visit: Payer: Self-pay

## 2021-04-06 DIAGNOSIS — Z79899 Other long term (current) drug therapy: Secondary | ICD-10-CM | POA: Diagnosis not present

## 2021-04-06 DIAGNOSIS — I878 Other specified disorders of veins: Secondary | ICD-10-CM | POA: Diagnosis present

## 2021-04-06 DIAGNOSIS — E1169 Type 2 diabetes mellitus with other specified complication: Secondary | ICD-10-CM | POA: Diagnosis present

## 2021-04-06 DIAGNOSIS — I96 Gangrene, not elsewhere classified: Secondary | ICD-10-CM | POA: Diagnosis present

## 2021-04-06 DIAGNOSIS — Z720 Tobacco use: Secondary | ICD-10-CM | POA: Diagnosis not present

## 2021-04-06 DIAGNOSIS — Z86718 Personal history of other venous thrombosis and embolism: Secondary | ICD-10-CM

## 2021-04-06 DIAGNOSIS — G4700 Insomnia, unspecified: Secondary | ICD-10-CM | POA: Diagnosis present

## 2021-04-06 DIAGNOSIS — Z7901 Long term (current) use of anticoagulants: Secondary | ICD-10-CM

## 2021-04-06 DIAGNOSIS — E871 Hypo-osmolality and hyponatremia: Secondary | ICD-10-CM | POA: Diagnosis present

## 2021-04-06 DIAGNOSIS — E11621 Type 2 diabetes mellitus with foot ulcer: Secondary | ICD-10-CM | POA: Diagnosis present

## 2021-04-06 DIAGNOSIS — G3184 Mild cognitive impairment, so stated: Secondary | ICD-10-CM | POA: Diagnosis not present

## 2021-04-06 DIAGNOSIS — E1140 Type 2 diabetes mellitus with diabetic neuropathy, unspecified: Secondary | ICD-10-CM | POA: Diagnosis present

## 2021-04-06 DIAGNOSIS — F17210 Nicotine dependence, cigarettes, uncomplicated: Secondary | ICD-10-CM | POA: Diagnosis present

## 2021-04-06 DIAGNOSIS — E1165 Type 2 diabetes mellitus with hyperglycemia: Secondary | ICD-10-CM | POA: Diagnosis present

## 2021-04-06 DIAGNOSIS — Z7984 Long term (current) use of oral hypoglycemic drugs: Secondary | ICD-10-CM | POA: Diagnosis not present

## 2021-04-06 DIAGNOSIS — E1152 Type 2 diabetes mellitus with diabetic peripheral angiopathy with gangrene: Principal | ICD-10-CM | POA: Diagnosis present

## 2021-04-06 DIAGNOSIS — Z20822 Contact with and (suspected) exposure to covid-19: Secondary | ICD-10-CM | POA: Diagnosis present

## 2021-04-06 DIAGNOSIS — D6489 Other specified anemias: Secondary | ICD-10-CM | POA: Diagnosis present

## 2021-04-06 DIAGNOSIS — Z89411 Acquired absence of right great toe: Secondary | ICD-10-CM

## 2021-04-06 DIAGNOSIS — I10 Essential (primary) hypertension: Secondary | ICD-10-CM | POA: Diagnosis present

## 2021-04-06 DIAGNOSIS — M869 Osteomyelitis, unspecified: Secondary | ICD-10-CM | POA: Diagnosis present

## 2021-04-06 DIAGNOSIS — Z66 Do not resuscitate: Secondary | ICD-10-CM | POA: Diagnosis present

## 2021-04-06 DIAGNOSIS — Z89412 Acquired absence of left great toe: Secondary | ICD-10-CM

## 2021-04-06 DIAGNOSIS — L97529 Non-pressure chronic ulcer of other part of left foot with unspecified severity: Secondary | ICD-10-CM | POA: Diagnosis present

## 2021-04-06 DIAGNOSIS — M86172 Other acute osteomyelitis, left ankle and foot: Secondary | ICD-10-CM | POA: Diagnosis not present

## 2021-04-06 LAB — GLUCOSE, CAPILLARY: Glucose-Capillary: 214 mg/dL — ABNORMAL HIGH (ref 70–99)

## 2021-04-06 LAB — CBC WITH DIFFERENTIAL/PLATELET
Abs Immature Granulocytes: 0.07 10*3/uL (ref 0.00–0.07)
Basophils Absolute: 0.1 10*3/uL (ref 0.0–0.1)
Basophils Relative: 1 %
Eosinophils Absolute: 0.1 10*3/uL (ref 0.0–0.5)
Eosinophils Relative: 1 %
HCT: 35.5 % — ABNORMAL LOW (ref 39.0–52.0)
Hemoglobin: 12.1 g/dL — ABNORMAL LOW (ref 13.0–17.0)
Immature Granulocytes: 1 %
Lymphocytes Relative: 8 %
Lymphs Abs: 0.8 10*3/uL (ref 0.7–4.0)
MCH: 28.6 pg (ref 26.0–34.0)
MCHC: 34.1 g/dL (ref 30.0–36.0)
MCV: 83.9 fL (ref 80.0–100.0)
Monocytes Absolute: 0.6 10*3/uL (ref 0.1–1.0)
Monocytes Relative: 6 %
Neutro Abs: 8.4 10*3/uL — ABNORMAL HIGH (ref 1.7–7.7)
Neutrophils Relative %: 83 %
Platelets: 279 10*3/uL (ref 150–400)
RBC: 4.23 MIL/uL (ref 4.22–5.81)
RDW: 12.6 % (ref 11.5–15.5)
WBC: 10 10*3/uL (ref 4.0–10.5)
nRBC: 0 % (ref 0.0–0.2)

## 2021-04-06 LAB — RESP PANEL BY RT-PCR (FLU A&B, COVID) ARPGX2
Influenza A by PCR: NEGATIVE
Influenza B by PCR: NEGATIVE
SARS Coronavirus 2 by RT PCR: NEGATIVE

## 2021-04-06 LAB — COMPREHENSIVE METABOLIC PANEL
ALT: 54 U/L — ABNORMAL HIGH (ref 0–44)
AST: 28 U/L (ref 15–41)
Albumin: 3.1 g/dL — ABNORMAL LOW (ref 3.5–5.0)
Alkaline Phosphatase: 73 U/L (ref 38–126)
Anion gap: 11 (ref 5–15)
BUN: 15 mg/dL (ref 8–23)
CO2: 26 mmol/L (ref 22–32)
Calcium: 8.8 mg/dL — ABNORMAL LOW (ref 8.9–10.3)
Chloride: 91 mmol/L — ABNORMAL LOW (ref 98–111)
Creatinine, Ser: 0.92 mg/dL (ref 0.61–1.24)
GFR, Estimated: >60 mL/min (ref >60)
Glucose, Bld: 260 mg/dL — ABNORMAL HIGH (ref 70–99)
Potassium: 3.9 mmol/L (ref 3.5–5.1)
Sodium: 128 mmol/L — ABNORMAL LOW (ref 135–145)
Total Bilirubin: 0.9 mg/dL (ref 0.3–1.2)
Total Protein: 6.3 g/dL — ABNORMAL LOW (ref 6.5–8.1)

## 2021-04-06 LAB — HEMOGLOBIN A1C
Hgb A1c MFr Bld: 7.9 % — ABNORMAL HIGH (ref 4.8–5.6)
Mean Plasma Glucose: 180.03 mg/dL

## 2021-04-06 LAB — LACTIC ACID, PLASMA: Lactic Acid, Venous: 1.3 mmol/L (ref 0.5–1.9)

## 2021-04-06 LAB — APTT: aPTT: 34 seconds (ref 24–36)

## 2021-04-06 LAB — PROTIME-INR
INR: 1.3 — ABNORMAL HIGH (ref 0.8–1.2)
Prothrombin Time: 16.2 seconds — ABNORMAL HIGH (ref 11.4–15.2)

## 2021-04-06 LAB — CBG MONITORING, ED: Glucose-Capillary: 156 mg/dL — ABNORMAL HIGH (ref 70–99)

## 2021-04-06 MED ORDER — PIPERACILLIN-TAZOBACTAM 3.375 G IVPB 30 MIN
3.3750 g | Freq: Once | INTRAVENOUS | Status: AC
  Administered 2021-04-06: 3.375 g via INTRAVENOUS
  Filled 2021-04-06: qty 50

## 2021-04-06 MED ORDER — ACETAMINOPHEN 650 MG RE SUPP: 650.0000 mg | Freq: Four times a day (QID) | RECTAL | Status: AC | PRN

## 2021-04-06 MED ORDER — INSULIN ASPART 100 UNIT/ML IJ SOLN
0.0000 [IU] | Freq: Three times a day (TID) | INTRAMUSCULAR | Status: DC
  Administered 2021-04-06: 3 [IU] via SUBCUTANEOUS
  Administered 2021-04-07: 11 [IU] via SUBCUTANEOUS
  Administered 2021-04-07: 5 [IU] via SUBCUTANEOUS
  Administered 2021-04-07: 18:00:00 8 [IU] via SUBCUTANEOUS
  Administered 2021-04-08: 3 [IU] via SUBCUTANEOUS
  Administered 2021-04-08 – 2021-04-09 (×2): 8 [IU] via SUBCUTANEOUS
  Administered 2021-04-09: 11 [IU] via SUBCUTANEOUS
  Administered 2021-04-10: 5 [IU] via SUBCUTANEOUS

## 2021-04-06 MED ORDER — OXYCODONE HCL 5 MG PO TABS
10.0000 mg | ORAL_TABLET | Freq: Four times a day (QID) | ORAL | Status: DC | PRN
  Administered 2021-04-06 – 2021-04-07 (×3): 10 mg via ORAL
  Filled 2021-04-06 (×3): qty 2

## 2021-04-06 MED ORDER — BENZTROPINE MESYLATE 1 MG PO TABS: 1.0000 mg | ORAL_TABLET | Freq: Every day | ORAL | Status: DC

## 2021-04-06 MED ORDER — ENOXAPARIN SODIUM 40 MG/0.4ML IJ SOSY
40.0000 mg | PREFILLED_SYRINGE | Freq: Every day | INTRAMUSCULAR | Status: DC
  Administered 2021-04-06 – 2021-04-07 (×2): 40 mg via SUBCUTANEOUS
  Filled 2021-04-06 (×2): qty 0.4

## 2021-04-06 MED ORDER — FENTANYL CITRATE PF 50 MCG/ML IJ SOSY
50.0000 ug | PREFILLED_SYRINGE | Freq: Once | INTRAMUSCULAR | Status: AC
  Administered 2021-04-06: 50 ug via INTRAVENOUS
  Filled 2021-04-06: qty 1

## 2021-04-06 MED ORDER — HYDROCODONE-ACETAMINOPHEN 5-325 MG PO TABS
1.0000 | ORAL_TABLET | Freq: Once | ORAL | Status: AC
  Administered 2021-04-06: 1 via ORAL
  Filled 2021-04-06: qty 1

## 2021-04-06 MED ORDER — ARIPIPRAZOLE 10 MG PO TABS: 10.0000 mg | ORAL_TABLET | Freq: Every day | ORAL | Status: DC

## 2021-04-06 MED ORDER — NICOTINE 21 MG/24HR TD PT24
21.0000 mg | MEDICATED_PATCH | Freq: Every day | TRANSDERMAL | Status: DC
  Administered 2021-04-06 – 2021-04-10 (×5): 21 mg via TRANSDERMAL
  Filled 2021-04-06 (×5): qty 1

## 2021-04-06 MED ORDER — HYDROMORPHONE HCL 1 MG/ML IJ SOLN
1.0000 mg | Freq: Once | INTRAMUSCULAR | Status: AC
  Administered 2021-04-06: 1 mg via INTRAVENOUS
  Filled 2021-04-06: qty 1

## 2021-04-06 MED ORDER — ACETAMINOPHEN 325 MG PO TABS
650.0000 mg | ORAL_TABLET | Freq: Four times a day (QID) | ORAL | Status: AC | PRN
  Administered 2021-04-07 (×2): 650 mg via ORAL
  Filled 2021-04-06 (×3): qty 2

## 2021-04-06 MED ORDER — FENTANYL CITRATE (PF) 100 MCG/2ML IJ SOLN: 50.0000 ug | Freq: Once | INTRAMUSCULAR | Status: DC

## 2021-04-06 MED ORDER — TRAZODONE HCL 100 MG PO TABS
100.0000 mg | ORAL_TABLET | Freq: Every day | ORAL | Status: DC
  Administered 2021-04-06 – 2021-04-09 (×4): 100 mg via ORAL
  Filled 2021-04-06 (×4): qty 1

## 2021-04-06 MED ORDER — GABAPENTIN 300 MG PO CAPS: 300.0000 mg | ORAL_CAPSULE | Freq: Three times a day (TID) | ORAL | Status: DC

## 2021-04-06 MED ORDER — PIPERACILLIN-TAZOBACTAM 3.375 G IVPB
3.3750 g | Freq: Three times a day (TID) | INTRAVENOUS | Status: AC
  Administered 2021-04-06 – 2021-04-09 (×9): 3.375 g via INTRAVENOUS
  Filled 2021-04-06 (×10): qty 50

## 2021-04-06 NOTE — Progress Notes (Signed)
°   04/06/21 1847  Vitals  Temp 98.8 F (37.1 C)  Temp Source Oral  BP 127/61  MAP (mmHg) 81  BP Location Right Arm  BP Method Automatic  Patient Position (if appropriate) Lying  Pulse Rate 76  Pulse Rate Source Monitor  Resp 16  MEWS COLOR  MEWS Score Color Green  Oxygen Therapy  SpO2 100 %  O2 Device Room Air  MEWS Score  MEWS Temp 0  MEWS Systolic 0  MEWS Pulse 0  MEWS RR 0  MEWS LOC 0  MEWS Score 0   Patient arrived to unit. Vital signs stable. Patient given a drink and shown call bell. Patient instructed to call staff before attempting to get out of bed the first time tonight so mobility can be further assessed. Patient agreeable to this.

## 2021-04-06 NOTE — ED Provider Notes (Addendum)
[facility] EMERGENCY DEPARTMENT Provider Note   CSN: [id number] Arrival date & time: 04/06/21  0945     History  Chief Complaint  Patient presents with   Foot Swelling    Randall Coleman is a 71 y.o. male.  HPI 71 year old male brought in by EMS for left foot infection/pain.  Medic provides initial history which is about a week of pain and discharge to his left foot.  He has a previous history of a great toe removal.  EMS estimates about a quarter sized area of infection.  He had some hyperglycemia on the way and was hypertensive.  He was otherwise stable during transport.  Patient tells me he has noticed some drainage and discomfort for about a week in his left foot.  He reports compliance with his meds including for diabetes.  He is also on Eliquis with a prior history of DVT.  He reports his pain is about an 8 out of 10.  He has not taken anything for the pain.  Home Medications Prior to Admission medications   Medication Sig Start Date End Date Taking? Authorizing Provider  apixaban (ELIQUIS) 5 MG TABS tablet Take 5 mg by mouth 2 (two) times daily.   Yes [provider]  Cyanocobalamin (VITAMIN B 12) 500 MCG TABS Take 1,000 mcg by mouth every morning.   Yes [provider]  glipiZIDE (GLUCOTROL) 5 MG tablet Take 5 mg by mouth every morning.   Yes [provider]  lisinopril (ZESTRIL) 10 MG tablet Take 1 tablet (10 mg total) by mouth daily. Patient taking differently: Take 10 mg by mouth every morning. 12/15/18  Yes Dhungel, Nishant, MD  Magnesium Oxide 420 (252 Mg) MG TABS Take 420 mg by mouth daily after breakfast.   Yes [provider]  metFORMIN (GLUCOPHAGE) 1000 MG tablet Take 1,000 mg by mouth 2 (two) times daily after a meal.   Yes [provider]  Multiple Vitamin (MULTIVITAMIN WITH MINERALS) TABS tablet Take 1 tablet by mouth every morning.   Yes [provider]  omeprazole (PRILOSEC) 20 MG capsule Take  20 mg by mouth 2 (two) times daily.   Yes [provider]  traZODone (DESYREL) 100 MG tablet Take 1 tablet (100 mg total) by mouth at bedtime. Patient taking differently: Take 200 mg by mouth at bedtime as needed for sleep. 12/15/18  Yes Dhungel, Nishant, MD  ARIPiprazole (ABILIFY) 20 MG tablet Take 0.5 tablets (10 mg total) by mouth daily. Patient not taking: Reported on 04/06/2021 12/15/18   Dhungel, [name], MD  benztropine (COGENTIN) 1 MG tablet Take 1 mg by mouth daily. Patient not taking: Reported on 04/06/2021    [provider]  gabapentin (NEURONTIN) 300 MG capsule Take 300 mg by mouth 3 (three) times daily. Patient not taking: Reported on 04/06/2021 03/15/17   [provider]  nicotine (NICODERM CQ - DOSED IN MG/24 HOURS) 21 mg/24hr patch Place 1 patch (21 mg total) onto the skin daily. Patient not taking: Reported on 04/06/2021 12/15/18   Louellen Molder, MD      Allergies    Patient has no known allergies.    Review of Systems   Review of Systems  Constitutional:  Negative for fever.  Musculoskeletal:  Positive for arthralgias.  Skin:  Positive for wound.   Physical Exam Updated Vital Signs BP 136/89    Pulse (!) 116    Temp 98.2 F (36.8 C) (Oral)    Resp 19    Ht  6\' 2"  (1.88 m)    Wt 86.2 kg    SpO2 99%    BMI 24.39 kg/m  Physical Exam Vitals and nursing note reviewed.  Constitutional:      Appearance: He is well-developed.  HENT:     Head: Normocephalic and atraumatic.  Cardiovascular:     Rate and Rhythm: Normal rate and regular rhythm.     Pulses:          Posterior tibial pulses are 2+ on the left side.     Heart sounds: Normal heart sounds.  Pulmonary:     Effort: Pulmonary effort is normal.  Abdominal:     General: There is no distension.  Musculoskeletal:     Left foot: Swelling and tenderness present.     Comments: See picture.  Second toe is diffusely swollen with a purulent wound with foul smell.  Skin:    General: Skin is warm  and dry.  Neurological:     Mental Status: He is alert.     ED Results / Procedures / Treatments   Labs (all labs ordered are listed, but only abnormal results are displayed) Labs Reviewed  COMPREHENSIVE METABOLIC PANEL - Abnormal; Notable for the following components:      Result Value   Sodium 128 (*)    Chloride 91 (*)    Glucose, Bld 260 (*)    Calcium 8.8 (*)    Total Protein 6.3 (*)    Albumin 3.1 (*)    ALT 54 (*)    All other components within normal limits  CBC WITH DIFFERENTIAL/PLATELET - Abnormal; Notable for the following components:   Hemoglobin 12.1 (*)    HCT 35.5 (*)    Neutro Abs 8.4 (*)    All other components within normal limits  PROTIME-INR - Abnormal; Notable for the following components:   Prothrombin Time 16.2 (*)    INR 1.3 (*)    All other components within normal limits  HEMOGLOBIN A1C - Abnormal; Notable for the following components:   Hgb A1c MFr Bld 7.9 (*)    All other components within normal limits  RESP PANEL BY RT-PCR (FLU A&B, COVID) ARPGX2  CULTURE, BLOOD (ROUTINE X 2)  CULTURE, BLOOD (ROUTINE X 2)  LACTIC ACID, PLASMA  APTT  CBC  BASIC METABOLIC PANEL  CBG MONITORING, ED    EKG EKG Interpretation  Date/Time:  Monday April 06 2021 10:16:38 EST Ventricular Rate:  105 PR Interval:  184 QRS Duration: 76 QT Interval:  334 QTC Calculation: 442 R Axis:   50 Text Interpretation: Sinus tachycardia Multiple premature complexes, vent & supraven Low voltage, extremity leads Confirmed by Sherwood Gambler [phone]) on 04/06/2021 10:25:37 AM  Radiology DG Foot Complete Left  Result Date: 04/06/2021 CLINICAL DATA:  Second toe infection. EXAM: LEFT FOOT - COMPLETE 3+ VIEW COMPARISON:  Jul 29, 2020. FINDINGS: Status post surgical amputation of phalanges of first toe. There appears to be lytic destruction involving the distal portion of the second distal phalanx as well as the second middle phalanx. Overlying soft tissue ulceration of second  toe is noted. Nearly complete resorption of second distal phalanx is noted. Mild posterior calcaneal spurring is noted. IMPRESSION: Lytic destruction is seen involving the phalanges of the second toe consistent with osteomyelitis. Electronically Signed   By: [name] M.D.   On: 04/06/2021 10:34    Procedures Procedures    Medications Ordered in ED Medications  traZODone (DESYREL) tablet 100 mg (has no administration in  time range)  enoxaparin (LOVENOX) injection 40 mg (40 mg Subcutaneous Given 04/06/21 1256)  acetaminophen (TYLENOL) tablet 650 mg (has no administration in time range)    Or  acetaminophen (TYLENOL) suppository 650 mg (has no administration in time range)  insulin aspart (novoLOG) injection 0-15 Units (has no administration in time range)  nicotine (NICODERM CQ - dosed in mg/24 hours) patch 21 mg (has no administration in time range)  oxyCODONE (Oxy IR/ROXICODONE) immediate release tablet 10 mg (has no administration in time range)  piperacillin-tazobactam (ZOSYN) IVPB 3.375 g (has no administration in time range)  fentaNYL (SUBLIMAZE) injection 50 mcg (50 mcg Intravenous Given 04/06/21 1034)  HYDROcodone-acetaminophen (NORCO/VICODIN) 5-325 MG per tablet 1 tablet (1 tablet Oral Given 04/06/21 1134)  piperacillin-tazobactam (ZOSYN) IVPB 3.375 g (0 g Intravenous Stopped 04/06/21 1239)  HYDROmorphone (DILAUDID) injection 1 mg (1 mg Intravenous Given 04/06/21 1306)    ED Course/ Medical Decision Making/ A&P                           Medical Decision Making Amount and/or Complexity of Data Reviewed Labs: ordered. Radiology: ordered. ECG/medicine tests: ordered.  Risk Prescription drug management. Decision regarding hospitalization.   Patient appears to have 2nd toe osteomyelitis, likely from and complicated by his history of diabetes and smoking. He is not septic, though he is tachycardic (probably more from pain). Labs show normal WBC with normal lactate. Is hyperglycemic  but not in DKA. Xrays personally reviewed, and has signs of osteo. Chart review shows Similar presentation in May 2022, where he was admitted and had partial amputation by Dr. Sharol Given. Otherwise, ECG obtained which shows tachycardia. Orthopedics, [facility], was consulted. Internal medicine teaching service consulted for admission. They ask for IV antibiotics at this time. Will admit.         Final Clinical Impression(s) / ED Diagnoses Final diagnoses:  Osteomyelitis of left foot, unspecified type [facility])    Rx / DC Orders ED Discharge Orders     None         Sherwood Gambler, MD 04/06/21 1735    Sherwood Gambler, MD 04/06/21 1736

## 2021-04-06 NOTE — Consult Note (Signed)
Reason for Consult:Left 2nd toe osteo Referring Physician: Sherwood Gambler Time called: 2094 Time at bedside: [place]   Randall Coleman is an 71 y.o. male.  HPI: Randall Coleman comes in with a 1 week hx/o left second toe pain and swelling. He's had some low grade swelling in that toe for months but hadn't really bothered him until recently. He denies fevers, chills, sweats, N/V.  Past Medical History:  Diagnosis Date   Diabetes mellitus without complication ([place])    DVT (deep venous thrombosis) ([place])    Hypertension     Past Surgical History:  Procedure Laterality Date   AMPUTATION Left 07/30/2020   Procedure: AMPUTATION LEFT GREAT TOE;  Surgeon: [name], MD;  Location: [facility];  Service: Orthopedics;  Laterality: Left;   HERNIA REPAIR     HERNIA REPAIR     IR CHEST FLUORO  12/13/2018    No family history on file.  Social History:  reports that he has been smoking cigarettes. He has a 54.00 pack-year smoking history. He has never used smokeless tobacco. He reports that he does not currently use alcohol. He reports that he does not use drugs.  Allergies: No Known Allergies  Medications: I have reviewed the patient's current medications.  Results for orders placed or performed during the hospital encounter of 04/06/21 (from the past 48 hour(s))  Lactic acid, plasma     Status: None   Collection Time: 04/06/21 10:12 AM  Result Value Ref Range   Lactic Acid, Venous 1.3 0.5 - 1.9 mmol/L    Comment: Performed at [facility], [address] [place], [place] [address]  Comprehensive metabolic panel     Status: Abnormal   Collection Time: 04/06/21 10:12 AM  Result Value Ref Range   Sodium 128 (L) 135 - 145 mmol/L   Potassium 3.9 3.5 - 5.1 mmol/L   Chloride 91 (L) 98 - 111 mmol/L   CO2 26 22 - 32 mmol/L   Glucose, Bld 260 (H) 70 - 99 mg/dL    Comment: Glucose reference range applies only to samples taken after fasting for at least 8 hours.   BUN 15 8 - 23 mg/dL   Creatinine, Ser  0.92 0.61 - 1.24 mg/dL   Calcium 8.8 (L) 8.9 - 10.3 mg/dL   Total Protein 6.3 (L) 6.5 - 8.1 g/dL   Albumin 3.1 (L) 3.5 - 5.0 g/dL   AST 28 15 - 41 U/L   ALT 54 (H) 0 - 44 U/L   Alkaline Phosphatase 73 38 - 126 U/L   Total Bilirubin 0.9 0.3 - 1.2 mg/dL   GFR, Estimated >60 >60 mL/min    Comment: (NOTE) Calculated using the CKD-EPI Creatinine Equation (2021)    Anion gap 11 5 - 15    Comment: Performed at [facility], [place] [address], [place], [place] [address]  CBC WITH DIFFERENTIAL     Status: Abnormal   Collection Time: 04/06/21 10:12 AM  Result Value Ref Range   WBC 10.0 4.0 - 10.5 K/uL   RBC 4.23 4.22 - 5.81 MIL/uL   Hemoglobin 12.1 (L) 13.0 - 17.0 g/dL   HCT 35.5 (L) 39.0 - 52.0 %   MCV 83.9 80.0 - 100.0 fL   MCH 28.6 26.0 - 34.0 pg   MCHC 34.1 30.0 - 36.0 g/dL   RDW 12.6 11.5 - 15.5 %   Platelets 279 150 - 400 K/uL   nRBC 0.0 0.0 - 0.2 %   Neutrophils Relative %  83 %   Neutro Abs 8.4 (H) 1.7 - 7.7 K/uL   Lymphocytes Relative 8 %   Lymphs Abs 0.8 0.7 - 4.0 K/uL   Monocytes Relative 6 %   Monocytes Absolute 0.6 0.1 - 1.0 K/uL   Eosinophils Relative 1 %   Eosinophils Absolute 0.1 0.0 - 0.5 K/uL   Basophils Relative 1 %   Basophils Absolute 0.1 0.0 - 0.1 K/uL   Immature Granulocytes 1 %   Abs Immature Granulocytes 0.07 0.00 - 0.07 K/uL    Comment: Performed at [facility] [address], [place], [place] [address]  Protime-INR     Status: Abnormal   Collection Time: 04/06/21 10:12 AM  Result Value Ref Range   Prothrombin Time 16.2 (H) 11.4 - 15.2 seconds   INR 1.3 (H) 0.8 - 1.2    Comment: (NOTE) INR goal varies based on device and disease states. Performed at [facility], [place] [address] [place], [place] [address]   APTT     Status: None   Collection Time: 04/06/21 10:12 AM  Result Value Ref Range   aPTT 34 24 - 36 seconds    Comment: Performed at [facility] [address] [place], [place] [address]    DG Foot Complete  Left  Result Date: 04/06/2021 CLINICAL DATA:  Second toe infection. EXAM: LEFT FOOT - COMPLETE 3+ VIEW COMPARISON:  Jul 29, 2020. FINDINGS: Status post surgical amputation of phalanges of first toe. There appears to be lytic destruction involving the distal portion of the second distal phalanx as well as the second middle phalanx. Overlying soft tissue ulceration of second toe is noted. Nearly complete resorption of second distal phalanx is noted. Mild posterior calcaneal spurring is noted. IMPRESSION: Lytic destruction is seen involving the phalanges of the second toe consistent with osteomyelitis. Electronically Signed   By: [name] M.D.   On: 04/06/2021 10:34    Review of Systems  HENT:  Negative for ear discharge, ear pain, hearing loss and tinnitus.   Eyes:  Negative for photophobia and pain.  Respiratory:  Negative for cough and shortness of breath.   Cardiovascular:  Negative for chest pain.  Gastrointestinal:  Negative for abdominal pain, nausea and vomiting.  Genitourinary:  Negative for dysuria, flank pain, frequency and urgency.  Musculoskeletal:  Positive for arthralgias (Left foot). Negative for back pain, myalgias and neck pain.  Neurological:  Negative for dizziness and headaches.  Hematological:  Does not bruise/bleed easily.  Psychiatric/Behavioral:  The patient is not nervous/anxious.   Blood pressure 137/71, pulse (!) 50, temperature 98.2 F (36.8 C), temperature source Oral, resp. rate 16, height 6\' 2"  (1.88 m), weight 86.2 kg, SpO2 99 %. Physical Exam Constitutional:      General: He is not in acute distress.    Appearance: He is well-developed. He is not diaphoretic.  HENT:     Head: Normocephalic and atraumatic.  Eyes:     General: No scleral icterus.       Right eye: No discharge.        Left eye: No discharge.     Conjunctiva/sclera: Conjunctivae normal.  Cardiovascular:     Rate and Rhythm: Normal rate and regular rhythm.  Pulmonary:     Effort:  Pulmonary effort is normal. No respiratory distress.  Musculoskeletal:     Cervical back: Normal range of motion.  Feet:     Comments: Left foot: No traumatic wounds, ulceration and fusiform edema 2nd  toe  Nontender  Sens DPN, SPN paresthetic, TN intact  Motor EHL, ext, flex, evers 5/5  DP 1+, PT 2+, No significant edema Skin:    General: Skin is warm and dry.  Neurological:     Mental Status: He is alert.  Psychiatric:        Mood and Affect: Mood normal.        Behavior: Behavior normal.    Assessment/Plan: Left 2nd toe osteo -- Dr. Sharol Given to evaluate later today or in AM. Likely amputation Wednesday.    Lisette Abu, PA-C Orthopedic Surgery [phone] 04/06/2021, 11:49 AM

## 2021-04-06 NOTE — Progress Notes (Signed)
Pharmacy Antibiotic Note  Randall Coleman is a 71 y.o. male admitted on 04/06/2021 with  wound infection .  Pharmacy has been consulted for zosyn dosing.  SCr 0.92 - at baseline WBC 10; LA 1.3; T 98.2 F  Plan: Zosyn 3.375g IV q8h (4 hour infusion) Trend WBC, Fever, Renal function, & Clinical course F/u cultures, clinical course, WBC, fever De-escalate when able  Height: 6\' 2"  (188 cm) Weight: 86.2 kg (190 lb) IBW/kg (Calculated) : 82.2  Temp (24hrs), Avg:98.2 F (36.8 C), Min:98.2 F (36.8 C), Max:98.2 F (36.8 C)  Recent Labs  Lab 04/06/21 1012  WBC 10.0  CREATININE 0.92  LATICACIDVEN 1.3    Estimated Creatinine Clearance: 88.1 mL/min (by C-G formula based on SCr of 0.92 mg/dL).    No Known Allergies  Antimicrobials this admission: zosyn 2/6 >>   Microbiology results: Pending  Thank you for allowing pharmacy to be a part of this patients care.  Lorelei Pont, PharmD, BCPS 04/06/2021 3:45 PM ED Clinical Pharmacist -  [phone]

## 2021-04-06 NOTE — ED Triage Notes (Signed)
Per [facility], Pt, from home, c/o pain and swelling in L 2nd toe x "a few days."  Pt has history of L great toe amputation.  Pain score 8/10.

## 2021-04-06 NOTE — H&P (Signed)
NAME:  Randall Coleman, MRN:  [id number], DOB:  September 06, 1950, LOS: 0 ADMISSION DATE:  04/06/2021, Primary: Pcp, No  CHIEF COMPLAINT:  left toe pain   Medical Service: Internal Medicine Teaching Service         Attending Physician: Dr. [name], MD    First Contact: Dr. Posey Pronto Pager: [phone]  Second Contact: Dr. Eulas Post Pager: [phone]       After Hours (After 5p/  First Contact Pager: [phone]  weekends / holidays): Second Contact Pager: [phone]    History of present illness   Randall Coleman is a 71 year old male with hypertension, diabetes, and history of amputation of the left first metatarsal who presents to the emergency department today for evaluation of left foot pain.  Patient is a generally poor historian due to some kind of cognitive impairment.  He notes that he developed some pain in his left foot a few days ago.  He notes that he had on a compression sock because he was told that this is good for him to have to reduce swelling.  He had the sock on for several days however decided to take it off this morning, at which time he noticed significant swelling and discharge from his left second toe. Denies fevers, chills.  Past Medical History  He,  has a past medical history of Diabetes mellitus without complication ([place]), DVT (deep venous thrombosis) ([place]), and Hypertension.   Home Medications     Prior to Admission medications   Medication Sig Start Date End Date Taking? Authorizing Provider  apixaban (ELIQUIS) 5 MG TABS tablet Take 5 mg by mouth 2 (two) times daily.   Yes [provider]  Cyanocobalamin (VITAMIN B 12) 500 MCG TABS Take 1,000 mcg by mouth every morning.   Yes [provider]  glipiZIDE (GLUCOTROL) 5 MG tablet Take 5 mg by mouth every morning.   Yes [provider]  lisinopril (ZESTRIL) 10 MG tablet Take 1 tablet (10 mg total) by mouth daily. Patient taking differently: Take 10 mg by mouth every morning. 12/15/18  Yes Dhungel,  Nishant, MD  Magnesium Oxide 420 (252 Mg) MG TABS Take 420 mg by mouth daily after breakfast.   Yes [provider]  metFORMIN (GLUCOPHAGE) 1000 MG tablet Take 1,000 mg by mouth 2 (two) times daily after a meal.   Yes [provider]  Multiple Vitamin (MULTIVITAMIN WITH MINERALS) TABS tablet Take 1 tablet by mouth every morning.   Yes [provider]  omeprazole (PRILOSEC) 20 MG capsule Take 20 mg by mouth 2 (two) times daily.   Yes [provider]  traZODone (DESYREL) 100 MG tablet Take 1 tablet (100 mg total) by mouth at bedtime. Patient taking differently: Take 200 mg by mouth at bedtime as needed for sleep. 12/15/18  Yes Dhungel, Nishant, MD  ARIPiprazole (ABILIFY) 20 MG tablet Take 0.5 tablets (10 mg total) by mouth daily. Patient not taking: Reported on 04/06/2021 12/15/18   Dhungel, [name], MD  benztropine (COGENTIN) 1 MG tablet Take 1 mg by mouth daily. Patient not taking: Reported on 04/06/2021    [provider]  gabapentin (NEURONTIN) 300 MG capsule Take 300 mg by mouth 3 (three) times daily. Patient not taking: Reported on 04/06/2021 03/15/17   [provider]  nicotine (NICODERM CQ - DOSED IN MG/24 HOURS) 21 mg/24hr patch Place 1 patch (21 mg total) onto the skin daily. Patient not taking: Reported on 04/06/2021 12/15/18   Louellen Molder, MD    Allergies  Allergies as of 04/06/2021   (No Known Allergies)    Social History   reports that he has been smoking cigarettes. He has a 54.00 pack-year smoking history. He has never used smokeless tobacco. He reports that he does not currently use alcohol. He reports that he does not use drugs.   Family History   His family history is not on file.   ROS  10 point review of systems negative unless stated in the HPI.  Objective   Blood pressure 136/89, pulse (!) 116, temperature 98.2 F (36.8 C), temperature source Oral, resp. rate 19, height 6' 2" (1.88 m), weight 86.2 kg, SpO2 99  %.    General: chronically ill appearing, no distress Eyes: no scleral icterus or conjunctival injection HEENT: MMM, head atraumatic Cardiac: RRR, no LE edema Pulm: breathing comfortably on room air, lung sounds clear GI: soft, non-tender MSK: normal muscle bulk and tone. Prior left great toe amputation. Skin: purulent left 2nd toe. No surrounding erythema. Chronic venous stasis skin changes on the lower extremities.  Neuro: alert, oriented x4.  Repeatedly asked the same questions multiple times during exam. Significant Diagnostic Tests:   Left foot exam: osteomyelitis of the 2nd metatarsal Labs    CBC Latest Ref Rng & Units 04/06/2021 08/02/2020 08/01/2020  WBC 4.0 - 10.5 K/uL 10.0 3.3(L) 4.7  Hemoglobin 13.0 - 17.0 g/dL 12.1(L) 10.4(L) 11.4(L)  Hematocrit 39.0 - 52.0 % 35.5(L) 30.1(L) 33.2(L)  Platelets 150 - 400 K/uL 279 194 199   BMP Latest Ref Rng & Units 04/06/2021 08/02/2020 08/01/2020  Glucose 70 - 99 mg/dL 260(H) 213(H) 247(H)  BUN 8 - 23 mg/dL _0 Creatinine 0.61 - 1.24 mg/dL 0.92 0.64 0.74  Sodium 135 - 145 mmol/L 128(L) 131(L) 132(L)  Potassium 3.5 - 5.1 mmol/L 3.9 3.9 4.6  Chloride 98 - 111 mmol/L 91(L) 102 102  CO2 22 - 32 mmol/L _1 Calcium 8.9 - 10.3 mg/dL 8.8(L) 8.1(L) 8.3(L)    Summary  71 year old chronically ill male with hypertension, diabetes, and history of prior amputation of the right great toe admitted today for medical and surgical management of osteomyelitis of the left second digit.  Assessment & Plan:  Principal Problem:   Osteomyelitis ([place])  Osteomyelitis of the left second toe Does not meet sepsis criteria on admission Continue Zosyn until source control was met Follow blood cultures Orthopedics consulted.  Planning for amputation on Wednesday As needed oxycodone for pain  Cognitive impairment.  Unclear of the etiology.  He does note that he frequently forgets that he has things.  He asked me the same question multiple times during our  discussion and was unable to provide a good history.  Unclear if this is being exacerbated by his acute infection versus this being his baseline.  Follows at the [facility] so prior records are unavailable.  Chronic mild asymptomatic hyponatremia.  Corrected sodium is 131.  Continue to monitor  Mildly elevated ALT.  Continue to monitor  Hypertension.  Chronic and stable.  Continue lisinopril  History of recurrent DVT.  Hold Eliquis in anticipation of amputation on Wednesday.  Type 2 diabetes mellitus.  A1c 7.9.  Hold metformin and glipizide.  Start sliding scale insulin.    Insomnia.  Continue trazodone  Tobacco use disorder.  Currently smokes 2 packs of cigarettes per day.  Start nicotine patch.  Encourage cessation  Chronic mild normocytic anemia.  Monitor intermittently   Best practice:  CODE STATUS: DNR DVT for prophylaxis: lovenox  Dispo: Admit patient to Inpatient with expected length of stay greater than 2 midnights.   Mitzi Hansen, MD Internal Medicine Resident PGY-3 [facility] Pager: [phone] 04/06/2021 3:49 PM

## 2021-04-07 DIAGNOSIS — M869 Osteomyelitis, unspecified: Secondary | ICD-10-CM | POA: Diagnosis not present

## 2021-04-07 DIAGNOSIS — M86172 Other acute osteomyelitis, left ankle and foot: Secondary | ICD-10-CM | POA: Diagnosis not present

## 2021-04-07 DIAGNOSIS — E1169 Type 2 diabetes mellitus with other specified complication: Secondary | ICD-10-CM | POA: Diagnosis not present

## 2021-04-07 DIAGNOSIS — G3184 Mild cognitive impairment, so stated: Secondary | ICD-10-CM

## 2021-04-07 DIAGNOSIS — Z720 Tobacco use: Secondary | ICD-10-CM

## 2021-04-07 DIAGNOSIS — E871 Hypo-osmolality and hyponatremia: Secondary | ICD-10-CM | POA: Diagnosis not present

## 2021-04-07 DIAGNOSIS — I10 Essential (primary) hypertension: Secondary | ICD-10-CM

## 2021-04-07 DIAGNOSIS — G4700 Insomnia, unspecified: Secondary | ICD-10-CM

## 2021-04-07 DIAGNOSIS — Z86718 Personal history of other venous thrombosis and embolism: Secondary | ICD-10-CM

## 2021-04-07 DIAGNOSIS — F17210 Nicotine dependence, cigarettes, uncomplicated: Secondary | ICD-10-CM

## 2021-04-07 LAB — GLUCOSE, CAPILLARY
Glucose-Capillary: 305 mg/dL — ABNORMAL HIGH (ref 70–99)
Glucose-Capillary: 233 mg/dL — ABNORMAL HIGH (ref 70–99)
Glucose-Capillary: 299 mg/dL — ABNORMAL HIGH (ref 70–99)
Glucose-Capillary: 221 mg/dL — ABNORMAL HIGH (ref 70–99)

## 2021-04-07 LAB — BASIC METABOLIC PANEL
Anion gap: 8 (ref 5–15)
BUN: 16 mg/dL (ref 8–23)
CO2: 27 mmol/L (ref 22–32)
Calcium: 8.7 mg/dL — ABNORMAL LOW (ref 8.9–10.3)
Chloride: 98 mmol/L (ref 98–111)
Creatinine, Ser: 1.06 mg/dL (ref 0.61–1.24)
GFR, Estimated: >60 mL/min (ref >60)
Glucose, Bld: 310 mg/dL — ABNORMAL HIGH (ref 70–99)
Potassium: 4.4 mmol/L (ref 3.5–5.1)
Sodium: 133 mmol/L — ABNORMAL LOW (ref 135–145)

## 2021-04-07 LAB — CBC
HCT: 34.3 % — ABNORMAL LOW (ref 39.0–52.0)
Hemoglobin: 11.9 g/dL — ABNORMAL LOW (ref 13.0–17.0)
MCH: 29.2 pg (ref 26.0–34.0)
MCHC: 34.7 g/dL (ref 30.0–36.0)
MCV: 84.3 fL (ref 80.0–100.0)
Platelets: 268 10*3/uL (ref 150–400)
RBC: 4.07 MIL/uL — ABNORMAL LOW (ref 4.22–5.81)
RDW: 12.8 % (ref 11.5–15.5)
WBC: 8.6 10*3/uL (ref 4.0–10.5)
nRBC: 0 % (ref 0.0–0.2)

## 2021-04-07 LAB — SURGICAL PCR SCREEN
MRSA, PCR: NEGATIVE
Staphylococcus aureus: NEGATIVE

## 2021-04-07 MED ORDER — OXYCODONE HCL 5 MG PO TABS
10.0000 mg | ORAL_TABLET | ORAL | Status: DC | PRN
  Administered 2021-04-07 (×3): 10 mg via ORAL
  Filled 2021-04-07 (×4): qty 2

## 2021-04-07 MED ORDER — INSULIN GLARGINE-YFGN 100 UNIT/ML ~~LOC~~ SOLN
5.0000 [IU] | Freq: Every day | SUBCUTANEOUS | Status: DC
  Administered 2021-04-07: 5 [IU] via SUBCUTANEOUS
  Filled 2021-04-07 (×2): qty 0.05

## 2021-04-07 MED ORDER — POVIDONE-IODINE 10 % EX SWAB
2.0000 "application " | Freq: Once | CUTANEOUS | Status: AC
  Administered 2021-04-08: 2 via TOPICAL

## 2021-04-07 MED ORDER — CHLORHEXIDINE GLUCONATE 4 % EX LIQD
60.0000 mL | Freq: Once | CUTANEOUS | Status: DC
  Filled 2021-04-07 (×2): qty 60

## 2021-04-07 NOTE — Progress Notes (Addendum)
° °  Subjective: °No acute overnight events.  ° °Patient was seen at bedside during rounds today. Pt reports understanding of why he is admitted. He tells me he will have surgery for his toe. He has pain associated the toe. No fevers or chills.  ° °Pt is updated on the plan for today, and all questions and concerns are addressed.  ° °Objective: ° °Vital signs in last 24 hours: °Vitals:  ° 04/07/21 0140 04/07/21 0436 04/07/21 0438 04/07/21 0746  °BP: (!) 142/84 125/78 125/78 (!) 157/86  °Pulse: 99 100 100 83  °Resp: 18  18 16  °Temp: 98.9 °F (37.2 °C) 98.2 °F (36.8 °C) 98.2 °F (36.8 °C) 98.8 °F (37.1 °C)  °TempSrc: Oral Oral Oral Oral  °SpO2: 100% 99% 99% 98%  °Weight:      °Height:      ° °General: chronically ill appearing, no distress °Eyes: no scleral icterus or conjunctival injection °Cardiac: RRR, no LE edema, DP pulses 2+ bilaterally  °Pulm: breathing comfortably on room air, lung sounds clear °GI: soft, non-tender °MSK: normal muscle bulk and tone. Prior left great toe amputation. °Skin: purulent left 2nd toe. No surrounding erythema.  °Neuro: alert, oriented x4 ° °Assessment/Plan: ° °Principal Problem: °  Osteomyelitis ([facility]) ° °Osteomyelitis of the left second toe °Cognitive impairment °No systemic signs of infection at this time. HDS, no leukocytosis, and afebrile. ABI's showing normal pulses, and DP pulses 2+ bilaterally. BC with NGTD. Pt understand diagnoses and tx plan. He lives alone and does not have help for post-op care; TOC consulted for assistance.  °- Orthopedics following, plan for for amputation at 1pm tmrw   °- Continue Zosyn until source control met  °- Follow-up blood cultures °- As needed oxycodone for pain °- TOC consulted  °  °Chronic mild asymptomatic hyponatremia °Correct Na improved to 135  °- Trend BMP ° °Hypertension °Chronic and stable °- Continue lisinopril ° °History of recurrent DVT °Holding Eliquis for amputation tmrw  ° °Type 2 diabetes mellitus °A1c 7.9. Holding home metformin  and glipizide. Will need to control blood sugars to <180 for wound healing postop.  °- SSI  °- CBG monitoring  ° °Insomnia °- Continue trazodone ° °Tobacco use disorder °- Nicotine patch  °  ° °Best Practice: °Diet: Normal °IVF: None,None °VTE: [place]  °Code: DNR ° ° ° , MD  °Internal Medicine Resident, PGY-1 °Pager: #[phone] °After 5pm on weekdays and 1pm on weekends: On Call pager [phone] °

## 2021-04-07 NOTE — Progress Notes (Signed)
°  Date: 04/07/2021  Patient name: Randall Coleman  Medical record number: [id number]  Date of birth: 04-15-1950   I have seen and evaluated Randall Coleman and discussed their care with the Residency Team.  In brief, patient is 71 year old male with past medical history of hypertension, type 2 diabetes and history of amputation of left first big toe, cognitive impairment who presented to the ED with worsening pain in his left second toe with purulent discharge.  Patient states that he was wearing compression stockings at home as he was told that this was good for him to reduce swelling.  He had the socks on for several days and a couple days prior to admission noted pain in his left foot.  He took off his socks on the day prior to admission and noted purulent discharge and swelling of the left second toe.  Patient also noted black discoloration over the left second toe.  No chest pain, no shortness of breath, no palpitations, no lightheadedness, no syncope, no focal weakness, no tingling or numbness, no headache, no blurry vision, no fevers or chills.  Today, patient still complains of persistent pain in his left second toe and states that the pain control has not been adequate.  PMHx, Fam Hx, and/or Soc Hx : As per resident admit note  Vitals:   04/07/21 0438 04/07/21 0746  BP: 125/78 (!) 157/86  Pulse: 100 83  Resp: 18 16  Temp: 98.2 F (36.8 C) 98.8 F (37.1 C)  SpO2: 99% 98%   General: Awake, alert, oriented x3, NAD CVS: Regular rate and rhythm, normal heart sounds, Lungs: CTA bilaterally Abdomen: Soft, nontender, nondistended, active bowel sounds Extremities: No edema noted, left second toe with ulceration with overlying blackish discoloration and discharge. Psych: Normal mood and affect HEENT: Normocephalic, atraumatic  Assessment and Plan: I have seen and evaluated the patient as outlined above. I agree with the formulated Assessment and Plan as detailed in the residents' note, with  the following changes:   1. Osteomyelitis of left second toe: -Patient presented to ED with pain, swelling and discharge from his left second toe in setting of a history of type 2 diabetes and prior amputation of left first toe.  X-ray done showed osteomyelitis of the left second toe. -We will continue with IV Zosyn for now -We will continue with pain control -Blood cultures no growth to date -Ortho follow-up and recommendations appreciated.  Patient scheduled for left second toe amputation tomorrow -We will follow TOC consult -Patient also noted to have elevated blood sugars with glucose levels in the 300s this morning.  We will need to have better control of his blood sugars to ensure better postop recovery -No further work-up at this time   [name], MD 2/7/20231:16 PM

## 2021-04-07 NOTE — Hospital Course (Addendum)
Osteomyelitis of the left second toe Cognitive impairment Did not meet sepsis criteria on admission. No systemic signs of infection at this time. HDS, no leukocytosis, and afebrile. ABI's showing normal pulses, and DP pulses 2+ bilaterally. BC negative. Pt understand diagnoses and tx plan. He lives alone and does not have help for post-op care; [facility] consulted for assistance. Zosyn started. Orthopedics consulted, left second toe amputation done on 04/08/21. Abx continued for an additional day. Pt discharged home with home health services. Discharged with pain medications.    Chronic mild asymptomatic hyponatremia Correct Na improved to 135    Hypertension Chronic and stable - Continues lisinopril  History of recurrent DVT Holding Eliquis for amputation tmrw   Type 2 diabetes mellitus A1c 7.9. Held home metformin and glipizide. Will need to control blood sugars to <180 for wound healing postop. Optimized his medication regimen at discharge to include Jardiance 10 mg and metformin. Discontinued Glipizide.  - SSI during hospitalization  - CBG monitoring during hospitalization   Insomnia - Continued trazodone  Tobacco use disorder - Nicotine patch

## 2021-04-07 NOTE — H&P (View-Only) (Signed)
ORTHOPAEDIC CONSULTATION  REQUESTING PHYSICIAN: [name], MD  Chief Complaint: Ulceration and necrosis left second toe.  HPI: Randall Coleman is a 71 y.o. male who presents with patient is a 71 year old gentleman with a history of diabetes status post patient of the left great toe in June of last year.  Patient has had progressive deformity and ulceration of the left second toe.  Past Medical History:  Diagnosis Date   Diabetes mellitus without complication ([place])    DVT (deep venous thrombosis) ([place])    Hypertension    Past Surgical History:  Procedure Laterality Date   AMPUTATION Left 07/30/2020   Procedure: AMPUTATION LEFT GREAT TOE;  Surgeon: [name], MD;  Location: [facility];  Service: Orthopedics;  Laterality: Left;   HERNIA REPAIR     HERNIA REPAIR     IR CHEST FLUORO  12/13/2018   Social History   Socioeconomic History   Marital status: Single    Spouse name: Not on file   Number of children: Not on file   Years of education: Not on file   Highest education level: Not on file  Occupational History   Occupation: retired  Tobacco Use   Smoking status: Every Day    Packs/day: 1.00    Years: 54.00    Pack years: 54.00    Types: Cigarettes   Smokeless tobacco: Never  Substance and Sexual Activity   Alcohol use: Not Currently    Comment: h/o heavy use, quit about 10 years ago   Drug use: Never    Comment: used when he was younger   Sexual activity: Not Currently  Other Topics Concern   Not on file  Social History Narrative   Not on file   Social Determinants of Health   Financial Resource Strain: Not on file  Food Insecurity: Not on file  Transportation Needs: Not on file  Physical Activity: Not on file  Stress: Not on file  Social Connections: Not on file   No family history on file. - negative except otherwise stated in the family history section No Known Allergies Prior to Admission medications   Medication Sig Start Date End Date  Taking? Authorizing Provider  apixaban (ELIQUIS) 5 MG TABS tablet Take 5 mg by mouth 2 (two) times daily.   Yes [provider]  Cyanocobalamin (VITAMIN B 12) 500 MCG TABS Take 1,000 mcg by mouth every morning.   Yes [provider]  glipiZIDE (GLUCOTROL) 5 MG tablet Take 5 mg by mouth every morning.   Yes [provider]  lisinopril (ZESTRIL) 10 MG tablet Take 1 tablet (10 mg total) by mouth daily. Patient taking differently: Take 10 mg by mouth every morning. 12/15/18  Yes Dhungel, Nishant, MD  Magnesium Oxide 420 (252 Mg) MG TABS Take 420 mg by mouth daily after breakfast.   Yes [provider]  metFORMIN (GLUCOPHAGE) 1000 MG tablet Take 1,000 mg by mouth 2 (two) times daily after a meal.   Yes [provider]  Multiple Vitamin (MULTIVITAMIN WITH MINERALS) TABS tablet Take 1 tablet by mouth every morning.   Yes [provider]  omeprazole (PRILOSEC) 20 MG capsule Take 20 mg by mouth 2 (two) times daily.   Yes [provider]  traZODone (DESYREL) 100 MG tablet Take 1 tablet (100 mg total) by mouth at bedtime. Patient taking differently: Take 200 mg by mouth at bedtime as needed for sleep. 12/15/18  Yes Dhungel, Nishant, MD  ARIPiprazole (ABILIFY) 20 MG tablet Take  0.5 tablets (10 mg total) by mouth daily. Patient not taking: Reported on 04/06/2021 12/15/18   Dhungel, [name], MD  benztropine (COGENTIN) 1 MG tablet Take 1 mg by mouth daily. Patient not taking: Reported on 04/06/2021    [provider]  gabapentin (NEURONTIN) 300 MG capsule Take 300 mg by mouth 3 (three) times daily. Patient not taking: Reported on 04/06/2021 03/15/17   [provider]  nicotine (NICODERM CQ - DOSED IN MG/24 HOURS) 21 mg/24hr patch Place 1 patch (21 mg total) onto the skin daily. Patient not taking: Reported on 04/06/2021 12/15/18   Louellen Molder, MD   DG Foot Complete Left  Result Date: 04/06/2021 CLINICAL DATA:  Second toe infection.  EXAM: LEFT FOOT - COMPLETE 3+ VIEW COMPARISON:  Jul 29, 2020. FINDINGS: Status post surgical amputation of phalanges of first toe. There appears to be lytic destruction involving the distal portion of the second distal phalanx as well as the second middle phalanx. Overlying soft tissue ulceration of second toe is noted. Nearly complete resorption of second distal phalanx is noted. Mild posterior calcaneal spurring is noted. IMPRESSION: Lytic destruction is seen involving the phalanges of the second toe consistent with osteomyelitis. Electronically Signed   By: [name] M.D.   On: 04/06/2021 10:34   - pertinent xrays, CT, MRI studies were reviewed and independently interpreted  Positive ROS: All other systems have been reviewed and were otherwise negative with the exception of those mentioned in the HPI and as above.  Physical Exam: General: Alert, no acute distress Psychiatric: Patient is competent for consent with normal mood and affect Lymphatic: No axillary or cervical lymphadenopathy Cardiovascular: No pedal edema Respiratory: No cyanosis, no use of accessory musculature GI: No organomegaly, abdomen is soft and non-tender    Images:  @ENCIMAGES @  Labs:  Lab Results  Component Value Date   HGBA1C 7.9 (H) 04/06/2021   HGBA1C 8.0 (H) 07/29/2020   HGBA1C 7.3 (H) 12/10/2018   REPTSTATUS PENDING 04/06/2021   CULT  04/06/2021    NO GROWTH < 24 HOURS Performed at [facility], [place] [address] [place], [place] [address]     Lab Results  Component Value Date   ALBUMIN 3.1 (L) 04/06/2021   ALBUMIN 4.2 12/09/2018     CBC EXTENDED Latest Ref Rng & Units 04/07/2021 04/06/2021 08/02/2020  WBC 4.0 - 10.5 K/uL 8.6 10.0 3.3(L)  RBC 4.22 - 5.81 MIL/uL 4.07(L) 4.23 3.66(L)  HGB 13.0 - 17.0 g/dL 11.9(L) 12.1(L) 10.4(L)  HCT 39.0 - 52.0 % 34.3(L) 35.5(L) 30.1(L)  PLT 150 - 400 K/uL 268 279 194  NEUTROABS 1.7 - 7.7 K/uL - 8.4(H) -  LYMPHSABS 0.7 - 4.0 K/uL - 0.8 -    Neurologic:  Patient does not have protective sensation bilateral lower extremities.   MUSCULOSKELETAL:   Skin: Examination there is ulceration swelling and dermatitis of the left second toe.  Radiograph shows osteomyelitis of the second toe.  Patient has a palpable anterior tibial and posterior tibial pulse.  Ankle-brachial indices shows normal pressures at the ankle however there is monophasic flow.  There is no ascending cellulitis.  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  Plan: We will plan for a left second toe amputation tomorrow Wednesday.  Risks and benefits were discussed including risk of the wound not healing.  Patient states he understands wished to proceed at this time.  Thank you for the consult and the opportunity to see Randall Coleman, [place] [phone] 10:23 AM

## 2021-04-07 NOTE — Plan of Care (Signed)

## 2021-04-07 NOTE — Consult Note (Signed)
ORTHOPAEDIC CONSULTATION  REQUESTING PHYSICIAN: [name], MD  Chief Complaint: Ulceration and necrosis left second toe.  HPI: Randall Coleman is a 71 y.o. male who presents with patient is a 71 year old gentleman with a history of diabetes status post patient of the left great toe in June of last year.  Patient has had progressive deformity and ulceration of the left second toe.  Past Medical History:  Diagnosis Date   Diabetes mellitus without complication ([place])    DVT (deep venous thrombosis) ([place])    Hypertension    Past Surgical History:  Procedure Laterality Date   AMPUTATION Left 07/30/2020   Procedure: AMPUTATION LEFT GREAT TOE;  Surgeon: [name], MD;  Location: [facility];  Service: Orthopedics;  Laterality: Left;   HERNIA REPAIR     HERNIA REPAIR     IR CHEST FLUORO  12/13/2018   Social History   Socioeconomic History   Marital status: Single    Spouse name: Not on file   Number of children: Not on file   Years of education: Not on file   Highest education level: Not on file  Occupational History   Occupation: retired  Tobacco Use   Smoking status: Every Day    Packs/day: 1.00    Years: 54.00    Pack years: 54.00    Types: Cigarettes   Smokeless tobacco: Never  Substance and Sexual Activity   Alcohol use: Not Currently    Comment: h/o heavy use, quit about 10 years ago   Drug use: Never    Comment: used when he was younger   Sexual activity: Not Currently  Other Topics Concern   Not on file  Social History Narrative   Not on file   Social Determinants of Health   Financial Resource Strain: Not on file  Food Insecurity: Not on file  Transportation Needs: Not on file  Physical Activity: Not on file  Stress: Not on file  Social Connections: Not on file   No family history on file. - negative except otherwise stated in the family history section No Known Allergies Prior to Admission medications   Medication Sig Start Date End Date  Taking? Authorizing Provider  apixaban (ELIQUIS) 5 MG TABS tablet Take 5 mg by mouth 2 (two) times daily.   Yes [provider]  Cyanocobalamin (VITAMIN B 12) 500 MCG TABS Take 1,000 mcg by mouth every morning.   Yes [provider]  glipiZIDE (GLUCOTROL) 5 MG tablet Take 5 mg by mouth every morning.   Yes [provider]  lisinopril (ZESTRIL) 10 MG tablet Take 1 tablet (10 mg total) by mouth daily. Patient taking differently: Take 10 mg by mouth every morning. 12/15/18  Yes Dhungel, Nishant, MD  Magnesium Oxide 420 (252 Mg) MG TABS Take 420 mg by mouth daily after breakfast.   Yes [provider]  metFORMIN (GLUCOPHAGE) 1000 MG tablet Take 1,000 mg by mouth 2 (two) times daily after a meal.   Yes [provider]  Multiple Vitamin (MULTIVITAMIN WITH MINERALS) TABS tablet Take 1 tablet by mouth every morning.   Yes [provider]  omeprazole (PRILOSEC) 20 MG capsule Take 20 mg by mouth 2 (two) times daily.   Yes [provider]  traZODone (DESYREL) 100 MG tablet Take 1 tablet (100 mg total) by mouth at bedtime. Patient taking differently: Take 200 mg by mouth at bedtime as needed for sleep. 12/15/18  Yes Dhungel, Nishant, MD  ARIPiprazole (ABILIFY) 20 MG tablet Take  0.5 tablets (10 mg total) by mouth daily. Patient not taking: Reported on 04/06/2021 12/15/18   Dhungel, [name], MD  benztropine (COGENTIN) 1 MG tablet Take 1 mg by mouth daily. Patient not taking: Reported on 04/06/2021    [provider]  gabapentin (NEURONTIN) 300 MG capsule Take 300 mg by mouth 3 (three) times daily. Patient not taking: Reported on 04/06/2021 03/15/17   [provider]  nicotine (NICODERM CQ - DOSED IN MG/24 HOURS) 21 mg/24hr patch Place 1 patch (21 mg total) onto the skin daily. Patient not taking: Reported on 04/06/2021 12/15/18   Louellen Molder, MD   DG Foot Complete Left  Result Date: 04/06/2021 CLINICAL DATA:  Second toe infection.  EXAM: LEFT FOOT - COMPLETE 3+ VIEW COMPARISON:  Jul 29, 2020. FINDINGS: Status post surgical amputation of phalanges of first toe. There appears to be lytic destruction involving the distal portion of the second distal phalanx as well as the second middle phalanx. Overlying soft tissue ulceration of second toe is noted. Nearly complete resorption of second distal phalanx is noted. Mild posterior calcaneal spurring is noted. IMPRESSION: Lytic destruction is seen involving the phalanges of the second toe consistent with osteomyelitis. Electronically Signed   By: [name] M.D.   On: 04/06/2021 10:34   - pertinent xrays, CT, MRI studies were reviewed and independently interpreted  Positive ROS: All other systems have been reviewed and were otherwise negative with the exception of those mentioned in the HPI and as above.  Physical Exam: General: Alert, no acute distress Psychiatric: Patient is competent for consent with normal mood and affect Lymphatic: No axillary or cervical lymphadenopathy Cardiovascular: No pedal edema Respiratory: No cyanosis, no use of accessory musculature GI: No organomegaly, abdomen is soft and non-tender    Images:  @ENCIMAGES @  Labs:  Lab Results  Component Value Date   HGBA1C 7.9 (H) 04/06/2021   HGBA1C 8.0 (H) 07/29/2020   HGBA1C 7.3 (H) 12/10/2018   REPTSTATUS PENDING 04/06/2021   CULT  04/06/2021    NO GROWTH < 24 HOURS Performed at [facility], [place] [address] [place], [place] [address]     Lab Results  Component Value Date   ALBUMIN 3.1 (L) 04/06/2021   ALBUMIN 4.2 12/09/2018     CBC EXTENDED Latest Ref Rng & Units 04/07/2021 04/06/2021 08/02/2020  WBC 4.0 - 10.5 K/uL 8.6 10.0 3.3(L)  RBC 4.22 - 5.81 MIL/uL 4.07(L) 4.23 3.66(L)  HGB 13.0 - 17.0 g/dL 11.9(L) 12.1(L) 10.4(L)  HCT 39.0 - 52.0 % 34.3(L) 35.5(L) 30.1(L)  PLT 150 - 400 K/uL 268 279 194  NEUTROABS 1.7 - 7.7 K/uL - 8.4(H) -  LYMPHSABS 0.7 - 4.0 K/uL - 0.8 -    Neurologic:  Patient does not have protective sensation bilateral lower extremities.   MUSCULOSKELETAL:   Skin: Examination there is ulceration swelling and dermatitis of the left second toe.  Radiograph shows osteomyelitis of the second toe.  Patient has a palpable anterior tibial and posterior tibial pulse.  Ankle-brachial indices shows normal pressures at the ankle however there is monophasic flow.  There is no ascending cellulitis.  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  Plan: We will plan for a left second toe amputation tomorrow Wednesday.  Risks and benefits were discussed including risk of the wound not healing.  Patient states he understands wished to proceed at this time.  Thank you for the consult and the opportunity to see Mr. Yanixan Mellinger, [place] [phone] 10:23 AM

## 2021-04-07 NOTE — Progress Notes (Addendum)
° °  Subjective: No acute overnight events.   Patient was seen at bedside during rounds today. He reports feeling well, with the exception of pain in his left foot. He is ready for surgery today.   Pt is updated on the plan for today, and all questions and concerns are addressed.   Objective:  Vital signs in last 24 hours: Vitals:   04/07/21 0140 04/07/21 0436 04/07/21 0438 04/07/21 0746  BP: (!) 142/84 125/78 125/78 (!) 157/86  Pulse: 99 100 100 83  Resp: _0 Temp: 98.9 F (37.2 C) 98.2 F (36.8 C) 98.2 F (36.8 C) 98.8 F (37.1 C)  TempSrc: Oral Oral Oral Oral  SpO2: 100% 99% 99% 98%  Weight:      Height:       General: chronically ill appearing, no distress Eyes: no scleral icterus or conjunctival injection Cardiac: RRR, no LE edema  Pulm: breathing comfortably on room air, lung sounds clear GI: soft, non-tender MSK: normal muscle bulk and tone. Prior left great toe amputation. Skin: purulent left 2nd toe. No surrounding erythema.  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 HDS, no leukocytosis, and afebrile. BC with NGTD. Pt understand  tx plan. TOC consulted for assistance in postop care as pt lives alone and has no help.  - Orthopedics following, plan for left 2nd toe amputation today  - Continue Zosyn until source control met   - Follow-up blood cultures - Control blood sugars (see below) - oxycodone 10 mg q4h prn on board  - TOC consulted     Chronic mild asymptomatic hyponatremia Correct Na improved to 137 - Trend BMP  Hypertension Chronic and stable   - Continue lisinopril  History of recurrent DVT Holding Eliquis for amputation today   Type 2 diabetes mellitus A1c 7.9. CBGs 220-290's. Holding home metformin and glipizide. Will  need to control blood sugars to <180 for wound healing postop.  - Semglee increased to 10 U - Added Novolog 4U tid  - SSI  - CBG monitoring  - Will consider starting SGLT2 inhibitor at discharge   Insomnia - Continue trazodone  Tobacco use disorder - Nicotine patch     Best Practice: Diet: Normal IVF: None,None VTE: Levenox  Code: DNR   Lajean Manes, MD  Internal Medicine Resident, PGY-1 Pager: [phone] After 5pm on weekdays and 1pm on weekends: On Call pager [phone]

## 2021-04-08 ENCOUNTER — Encounter (HOSPITAL_COMMUNITY): Payer: Self-pay | Admitting: Internal Medicine

## 2021-04-08 ENCOUNTER — Encounter (HOSPITAL_COMMUNITY)
Admission: EM | Disposition: A | Discharge status: Home Health | Payer: Self-pay | Source: Home / Self Care | Attending: Internal Medicine

## 2021-04-08 ENCOUNTER — Inpatient Hospital Stay (HOSPITAL_COMMUNITY): Payer: No Typology Code available for payment source | Admitting: Anesthesiology

## 2021-04-08 DIAGNOSIS — E1169 Type 2 diabetes mellitus with other specified complication: Secondary | ICD-10-CM | POA: Diagnosis not present

## 2021-04-08 DIAGNOSIS — E871 Hypo-osmolality and hyponatremia: Secondary | ICD-10-CM | POA: Diagnosis not present

## 2021-04-08 DIAGNOSIS — M86172 Other acute osteomyelitis, left ankle and foot: Secondary | ICD-10-CM | POA: Diagnosis not present

## 2021-04-08 DIAGNOSIS — G3184 Mild cognitive impairment, so stated: Secondary | ICD-10-CM | POA: Diagnosis not present

## 2021-04-08 DIAGNOSIS — M869 Osteomyelitis, unspecified: Secondary | ICD-10-CM | POA: Diagnosis not present

## 2021-04-08 HISTORY — PX: AMPUTATION: SHX166

## 2021-04-08 LAB — GLUCOSE, CAPILLARY
Glucose-Capillary: 287 mg/dL — ABNORMAL HIGH (ref 70–99)
Glucose-Capillary: 116 mg/dL — ABNORMAL HIGH (ref 70–99)
Glucose-Capillary: 108 mg/dL — ABNORMAL HIGH (ref 70–99)
Glucose-Capillary: 164 mg/dL — ABNORMAL HIGH (ref 70–99)

## 2021-04-08 LAB — CBC
HCT: 33.2 % — ABNORMAL LOW (ref 39.0–52.0)
Hemoglobin: 11.5 g/dL — ABNORMAL LOW (ref 13.0–17.0)
MCH: 29.8 pg (ref 26.0–34.0)
MCHC: 34.6 g/dL (ref 30.0–36.0)
MCV: 86 fL (ref 80.0–100.0)
Platelets: 217 10*3/uL (ref 150–400)
RBC: 3.86 MIL/uL — ABNORMAL LOW (ref 4.22–5.81)
RDW: 12.8 % (ref 11.5–15.5)
WBC: 5.8 10*3/uL (ref 4.0–10.5)
nRBC: 0 % (ref 0.0–0.2)

## 2021-04-08 LAB — BASIC METABOLIC PANEL
Anion gap: 8 (ref 5–15)
BUN: 14 mg/dL (ref 8–23)
CO2: 28 mmol/L (ref 22–32)
Calcium: 8.6 mg/dL — ABNORMAL LOW (ref 8.9–10.3)
Chloride: 98 mmol/L (ref 98–111)
Creatinine, Ser: 0.87 mg/dL (ref 0.61–1.24)
GFR, Estimated: >60 mL/min (ref >60)
Glucose, Bld: 261 mg/dL — ABNORMAL HIGH (ref 70–99)
Potassium: 4.4 mmol/L (ref 3.5–5.1)
Sodium: 134 mmol/L — ABNORMAL LOW (ref 135–145)

## 2021-04-08 SURGERY — AMPUTATION DIGIT
Anesthesia: Monitor Anesthesia Care | Site: Second Toe | Laterality: Left

## 2021-04-08 MED ORDER — PROPOFOL 10 MG/ML IV BOLUS
INTRAVENOUS | Status: DC | PRN
Start: 2021-04-08 — End: 2021-04-08
  Administered 2021-04-08: 20 mg via INTRAVENOUS

## 2021-04-08 MED ORDER — DOCUSATE SODIUM 100 MG PO CAPS
100.0000 mg | ORAL_CAPSULE | Freq: Every day | ORAL | Status: DC
  Administered 2021-04-09 – 2021-04-10 (×2): 100 mg via ORAL
  Filled 2021-04-08 (×2): qty 1

## 2021-04-08 MED ORDER — ZINC SULFATE 220 (50 ZN) MG PO CAPS
220.0000 mg | ORAL_CAPSULE | Freq: Every day | ORAL | Status: DC
  Administered 2021-04-08 – 2021-04-10 (×3): 220 mg via ORAL
  Filled 2021-04-08 (×3): qty 1

## 2021-04-08 MED ORDER — ALUM & MAG HYDROXIDE-SIMETH 200-200-20 MG/5ML PO SUSP: 15.0000 mL | ORAL | Status: DC | PRN

## 2021-04-08 MED ORDER — ORAL CARE MOUTH RINSE: 15.0000 mL | Freq: Once | OROMUCOSAL | Status: AC

## 2021-04-08 MED ORDER — INSULIN GLARGINE-YFGN 100 UNIT/ML ~~LOC~~ SOLN
10.0000 [IU] | Freq: Every day | SUBCUTANEOUS | Status: DC
  Administered 2021-04-08 – 2021-04-09 (×2): 10 [IU] via SUBCUTANEOUS
  Filled 2021-04-08 (×3): qty 0.1

## 2021-04-08 MED ORDER — PHENOL 1.4 % MT LIQD: 1.0000 | OROMUCOSAL | Status: DC | PRN

## 2021-04-08 MED ORDER — MAGNESIUM CITRATE PO SOLN: 1.0000 | Freq: Once | ORAL | Status: DC | PRN

## 2021-04-08 MED ORDER — MORPHINE SULFATE (PF) 2 MG/ML IV SOLN: 0.5000 mg | INTRAVENOUS | Status: DC | PRN

## 2021-04-08 MED ORDER — ONDANSETRON HCL 4 MG/2ML IJ SOLN
INTRAMUSCULAR | Status: DC | PRN
Start: 2021-04-08 — End: 2021-04-08
  Administered 2021-04-08: 4 mg via INTRAVENOUS

## 2021-04-08 MED ORDER — FENTANYL CITRATE (PF) 100 MCG/2ML IJ SOLN
INTRAMUSCULAR | Status: AC
  Administered 2021-04-08: 50 ug via INTRAVENOUS
  Filled 2021-04-08: qty 2

## 2021-04-08 MED ORDER — OXYCODONE HCL 5 MG PO TABS: 5.0000 mg | ORAL_TABLET | Freq: Once | ORAL | Status: DC | PRN

## 2021-04-08 MED ORDER — POLYETHYLENE GLYCOL 3350 17 G PO PACK: 17.0000 g | PACK | Freq: Every day | ORAL | Status: DC | PRN

## 2021-04-08 MED ORDER — HYDROCODONE-ACETAMINOPHEN 7.5-325 MG PO TABS
1.0000 | ORAL_TABLET | ORAL | Status: DC | PRN
  Administered 2021-04-08 – 2021-04-09 (×3): 2 via ORAL
  Filled 2021-04-08 (×3): qty 2

## 2021-04-08 MED ORDER — BUPIVACAINE HCL (PF) 0.5 % IJ SOLN
INTRAMUSCULAR | Status: DC | PRN
  Administered 2021-04-08: 25 mL via PERINEURAL

## 2021-04-08 MED ORDER — INSULIN ASPART 100 UNIT/ML IJ SOLN
4.0000 [IU] | Freq: Three times a day (TID) | INTRAMUSCULAR | Status: DC
  Administered 2021-04-08 – 2021-04-09 (×3): 4 [IU] via SUBCUTANEOUS

## 2021-04-08 MED ORDER — POTASSIUM CHLORIDE CRYS ER 20 MEQ PO TBCR: 20.0000 meq | EXTENDED_RELEASE_TABLET | Freq: Every day | ORAL | Status: DC | PRN

## 2021-04-08 MED ORDER — HYDRALAZINE HCL 20 MG/ML IJ SOLN: 5.0000 mg | INTRAMUSCULAR | Status: DC | PRN

## 2021-04-08 MED ORDER — MAGNESIUM SULFATE 2 GM/50ML IV SOLN
2.0000 g | Freq: Every day | INTRAVENOUS | Status: DC | PRN
  Filled 2021-04-08: qty 50

## 2021-04-08 MED ORDER — BISACODYL 5 MG PO TBEC: 5.0000 mg | DELAYED_RELEASE_TABLET | Freq: Every day | ORAL | Status: DC | PRN

## 2021-04-08 MED ORDER — PANTOPRAZOLE SODIUM 40 MG PO TBEC
40.0000 mg | DELAYED_RELEASE_TABLET | Freq: Every day | ORAL | Status: DC
  Administered 2021-04-08 – 2021-04-10 (×3): 40 mg via ORAL
  Filled 2021-04-08 (×3): qty 1

## 2021-04-08 MED ORDER — SODIUM CHLORIDE 0.9 % IV SOLN
INTRAVENOUS | Status: DC
Start: 2021-04-08 — End: 2021-04-10

## 2021-04-08 MED ORDER — METOPROLOL TARTRATE 5 MG/5ML IV SOLN: 2.0000 mg | INTRAVENOUS | Status: DC | PRN

## 2021-04-08 MED ORDER — LACTATED RINGERS IV SOLN: INTRAVENOUS | Status: DC

## 2021-04-08 MED ORDER — GUAIFENESIN-DM 100-10 MG/5ML PO SYRP: 15.0000 mL | ORAL_SOLUTION | ORAL | Status: DC | PRN

## 2021-04-08 MED ORDER — ACETAMINOPHEN 500 MG PO TABS
1000.0000 mg | ORAL_TABLET | Freq: Once | ORAL | Status: AC
  Administered 2021-04-08: 1000 mg via ORAL
  Filled 2021-04-08: qty 2

## 2021-04-08 MED ORDER — MIDAZOLAM HCL 2 MG/2ML IJ SOLN
INTRAMUSCULAR | Status: AC
  Administered 2021-04-08: 1 mg via INTRAVENOUS
  Filled 2021-04-08: qty 2

## 2021-04-08 MED ORDER — JUVEN PO PACK
1.0000 | PACK | Freq: Two times a day (BID) | ORAL | Status: DC
  Administered 2021-04-09 – 2021-04-10 (×3): 1 via ORAL
  Filled 2021-04-08 (×3): qty 1

## 2021-04-08 MED ORDER — HYDROCODONE-ACETAMINOPHEN 5-325 MG PO TABS
1.0000 | ORAL_TABLET | ORAL | Status: DC | PRN
  Administered 2021-04-09: 2 via ORAL
  Filled 2021-04-08: qty 2

## 2021-04-08 MED ORDER — FENTANYL CITRATE (PF) 100 MCG/2ML IJ SOLN
25.0000 ug | INTRAMUSCULAR | Status: DC | PRN
  Administered 2021-04-08 (×2): 25 ug via INTRAVENOUS

## 2021-04-08 MED ORDER — OXYCODONE HCL 5 MG PO TABS
10.0000 mg | ORAL_TABLET | ORAL | Status: DC | PRN
  Administered 2021-04-08 – 2021-04-10 (×11): 10 mg via ORAL
  Filled 2021-04-08 (×11): qty 2

## 2021-04-08 MED ORDER — MIDAZOLAM HCL 2 MG/2ML IJ SOLN: 1.0000 mg | Freq: Once | INTRAMUSCULAR | Status: AC

## 2021-04-08 MED ORDER — FENTANYL CITRATE (PF) 100 MCG/2ML IJ SOLN
INTRAMUSCULAR | Status: AC
  Filled 2021-04-08: qty 2

## 2021-04-08 MED ORDER — ENOXAPARIN SODIUM 40 MG/0.4ML IJ SOSY
40.0000 mg | PREFILLED_SYRINGE | INTRAMUSCULAR | Status: DC
  Administered 2021-04-09: 40 mg via SUBCUTANEOUS
  Filled 2021-04-08: qty 0.4

## 2021-04-08 MED ORDER — PROPOFOL 500 MG/50ML IV EMUL
INTRAVENOUS | Status: DC | PRN
  Administered 2021-04-08: 100 ug/kg/min via INTRAVENOUS

## 2021-04-08 MED ORDER — FENTANYL CITRATE (PF) 250 MCG/5ML IJ SOLN
INTRAMUSCULAR | Status: AC
  Filled 2021-04-08: qty 5

## 2021-04-08 MED ORDER — OXYCODONE HCL 5 MG/5ML PO SOLN: 5.0000 mg | Freq: Once | ORAL | Status: DC | PRN

## 2021-04-08 MED ORDER — LABETALOL HCL 5 MG/ML IV SOLN: 10.0000 mg | INTRAVENOUS | Status: DC | PRN

## 2021-04-08 MED ORDER — ONDANSETRON HCL 4 MG/2ML IJ SOLN: 4.0000 mg | Freq: Four times a day (QID) | INTRAMUSCULAR | Status: DC | PRN

## 2021-04-08 MED ORDER — CHLORHEXIDINE GLUCONATE 0.12 % MT SOLN
15.0000 mL | Freq: Once | OROMUCOSAL | Status: AC
  Administered 2021-04-08: 15 mL via OROMUCOSAL

## 2021-04-08 MED ORDER — ACETAMINOPHEN 325 MG PO TABS: 325.0000 mg | ORAL_TABLET | Freq: Four times a day (QID) | ORAL | Status: DC | PRN

## 2021-04-08 MED ORDER — FENTANYL CITRATE (PF) 100 MCG/2ML IJ SOLN: 50.0000 ug | Freq: Once | INTRAMUSCULAR | Status: AC

## 2021-04-08 MED ORDER — ASCORBIC ACID 500 MG PO TABS
1000.0000 mg | ORAL_TABLET | Freq: Every day | ORAL | Status: DC
  Administered 2021-04-08 – 2021-04-10 (×3): 1000 mg via ORAL
  Filled 2021-04-08 (×3): qty 2

## 2021-04-08 MED ORDER — 0.9 % SODIUM CHLORIDE (POUR BTL) OPTIME
TOPICAL | Status: DC | PRN
  Administered 2021-04-08: 1000 mL

## 2021-04-08 SURGICAL SUPPLY — 28 items
BAG COUNTER SPONGE SURGICOUNT (BAG) ×2 IMPLANT
BLADE SURG 21 STRL SS (BLADE) ×2 IMPLANT
BNDG COHESIVE 1X5 TAN STRL LF (GAUZE/BANDAGES/DRESSINGS) ×1 IMPLANT
BNDG COHESIVE 4X5 TAN STRL (GAUZE/BANDAGES/DRESSINGS) ×2 IMPLANT
BNDG ESMARK 4X9 LF (GAUZE/BANDAGES/DRESSINGS) IMPLANT
BNDG GAUZE ELAST 4 BULKY (GAUZE/BANDAGES/DRESSINGS) ×2 IMPLANT
COVER SURGICAL LIGHT HANDLE (MISCELLANEOUS) ×4 IMPLANT
DRAPE U-SHAPE 47X51 STRL (DRAPES) ×2 IMPLANT
DRSG ADAPTIC 3X8 NADH LF (GAUZE/BANDAGES/DRESSINGS) ×1 IMPLANT
DRSG PAD ABDOMINAL 8X10 ST (GAUZE/BANDAGES/DRESSINGS) ×2 IMPLANT
DURAPREP 26ML APPLICATOR (WOUND CARE) ×2 IMPLANT
ELECT REM PT RETURN 9FT ADLT (ELECTROSURGICAL) ×2
ELECTRODE REM PT RTRN 9FT ADLT (ELECTROSURGICAL) ×1 IMPLANT
GAUZE SPONGE 4X4 12PLY STRL (GAUZE/BANDAGES/DRESSINGS) ×1 IMPLANT
GLOVE SURG ORTHO LTX SZ9 (GLOVE) ×2 IMPLANT
GLOVE SURG UNDER POLY LF SZ9 (GLOVE) ×2 IMPLANT
GOWN STRL REUS W/ TWL XL LVL3 (GOWN DISPOSABLE) ×2 IMPLANT
GOWN STRL REUS W/TWL XL LVL3 (GOWN DISPOSABLE) ×2
KIT BASIN OR (CUSTOM PROCEDURE TRAY) ×2 IMPLANT
KIT TURNOVER KIT B (KITS) ×2 IMPLANT
MANIFOLD NEPTUNE II (INSTRUMENTS) ×2 IMPLANT
NEEDLE 22X1 1/2 (OR ONLY) (NEEDLE) IMPLANT
NS IRRIG 1000ML POUR BTL (IV SOLUTION) ×2 IMPLANT
PACK ORTHO EXTREMITY (CUSTOM PROCEDURE TRAY) ×2 IMPLANT
PAD ARMBOARD 7.5X6 YLW CONV (MISCELLANEOUS) ×4 IMPLANT
SUT ETHILON 2 0 PSLX (SUTURE) ×2 IMPLANT
SYR CONTROL 10ML LL (SYRINGE) IMPLANT
TOWEL GREEN STERILE (TOWEL DISPOSABLE) ×2 IMPLANT

## 2021-04-08 NOTE — Anesthesia Procedure Notes (Signed)
Anesthesia Regional Block: Ankle block   Pre-Anesthetic Checklist: , timeout performed,  Correct Patient, Correct Site, Correct Laterality,  Correct Procedure, Correct Position, site marked,  Risks and benefits discussed,  Pre-op evaluation,  At surgeon's request and post-op pain management  Laterality: Left  Prep: Maximum Sterile Barrier Precautions used, chloraprep       Needles:  Injection technique: Single-shot  Needle Type: Echogenic Needle     Needle Length: 4cm  Needle Gauge: 25     Additional Needles:   Narrative:  Start time: 04/08/2021 12:42 PM End time: 04/08/2021 12:45 PM  Performed by: Personally  Anesthesiologist: Brennan Bailey, MD  Additional Notes: Risks, benefits, and alternative discussed. Patient gave consent for procedure. Patient prepped and draped in sterile fashion. Sedation administered, patient remains easily responsive to voice. Local anesthetic given in 5cc increments with no signs or symptoms of intravascular injection. No pain or paraesthesias with injection. Patient monitored throughout procedure with signs of LAST or immediate complications. Tolerated well.   Tawny Asal, MD

## 2021-04-08 NOTE — Progress Notes (Signed)
OT Cancellation Note  Patient Details Name: Randall Coleman MRN: [id number] DOB: [birth year]   Cancelled Treatment:    Reason Eval/Treat Not Completed: Patient at procedure or test/ unavailable (in Sx for amputation). OT will follow post-op to evaluate need for post-acute therapy  Jaci Carrel 04/08/2021, 1:10 PM  Jesse Sans OTR/L Acute Rehabilitation Services Pager: [phone] Office: [phone]

## 2021-04-08 NOTE — Progress Notes (Signed)
Mobility Specialist Progress Note:   04/08/21 1308  Mobility  Activity Off unit   Will follow-up as time allows.   [facility] Public librarian Phone [phone]

## 2021-04-08 NOTE — Op Note (Signed)
04/08/2021  1:51 PM  PATIENT:  Randall Coleman    PRE-OPERATIVE DIAGNOSIS:  Osteomyelitis Left 2nd Toe  POST-OPERATIVE DIAGNOSIS:  Same  PROCEDURE:  AMPUTATION LEFT 2ND ray.  SURGEON:  [name], MD  PHYSICIAN ASSISTANT:None ANESTHESIA:   General  PREOPERATIVE INDICATIONS:  [name] is a  71 y.o. male with a diagnosis of Osteomyelitis Left 2nd Toe who failed conservative measures and elected for surgical management.    The risks benefits and alternatives were discussed with the patient preoperatively including but not limited to the risks of infection, bleeding, nerve injury, cardiopulmonary complications, the need for revision surgery, among others, and the patient was willing to proceed.  OPERATIVE IMPLANTS: None  @ENCIMAGES @  OPERATIVE FINDINGS: Minimal petechial bleeding tissue margins were clear  OPERATIVE PROCEDURE: Patient was brought the operating room after undergoing a regional anesthetic.  After adequate levels anesthesia were obtained patient's left lower extremity was prepped using DuraPrep draped into a sterile field a timeout was called.  Patient was status post a previous great toe amputation.  A fishmouth incision was made in line with his previous incision this was carried down to the MTP joint and the toe was amputated.  Patient had a prominent metatarsal head and the metatarsal head was resected as well.  The flexor tendons were pulled cut and allowed to retract.  There was some mild petechial bleeding along the wound edges.  The wound was irrigated with normal saline incision closed using 2-0 nylon and a sterile dressing was applied patient was taken to the PACU in stable condition.   DISCHARGE PLANNING:  Antibiotic duration: Continue antibiotics for 24 hours  Weightbearing: Touchdown weightbearing on the left with a postoperative shoe  Pain medication: Low-dose opioid pathway  Dressing care/ Wound VAC: Change dressing as needed  Ambulatory devices:  Walker or crutches  Discharge to: Anticipate discharge to home.  Follow-up: In the office 1 week post operative.

## 2021-04-08 NOTE — Anesthesia Preprocedure Evaluation (Addendum)
Anesthesia Evaluation  Patient identified by MRN, date of birth, ID band Patient awake    Reviewed: Allergy & Precautions, NPO status , Patient's Chart, lab work & pertinent test results  History of Anesthesia Complications Negative for: history of anesthetic complications  Airway Mallampati: II  TM Distance: >3 FB Neck ROM: Full    Dental no notable dental hx.    Pulmonary Current Smoker,    Pulmonary exam normal        Cardiovascular hypertension, Pt. on medications + DVT  Normal cardiovascular exam     Neuro/Psych Anxiety Depression negative neurological ROS     GI/Hepatic negative GI ROS, Neg liver ROS,   Endo/Other  diabetes, Type 2, Oral Hypoglycemic Agents  Renal/GU negative Renal ROS  negative genitourinary   Musculoskeletal negative musculoskeletal ROS (+)   Abdominal   Peds  Hematology  (+) Blood dyscrasia (Hgb 11.5), anemia ,   Anesthesia Other Findings Day of surgery medications reviewed with patient.  Reproductive/Obstetrics negative OB ROS                            Anesthesia Physical Anesthesia Plan  ASA: 3  Anesthesia Plan: MAC and Regional   Post-op Pain Management: Minimal or no pain anticipated and Tylenol PO (pre-op)   Induction:   PONV Risk Score and Plan: 0 and Treatment may vary due to age or medical condition, Propofol infusion, Ondansetron and Midazolam  Airway Management Planned: Natural Airway and Simple Face Mask  Additional Equipment: None  Intra-op Plan:   Post-operative Plan:   Informed Consent: I have reviewed the patients History and Physical, chart, labs and discussed the procedure including the risks, benefits and alternatives for the proposed anesthesia with the patient or authorized representative who has indicated his/her understanding and acceptance.   Patient has DNR.  Discussed DNR with patient and Suspend DNR.     Plan  Discussed with: CRNA  Anesthesia Plan Comments:       Anesthesia Quick Evaluation

## 2021-04-08 NOTE — TOC Initial Note (Addendum)
Transition of Care [facility]) - Initial/Assessment Note    Patient Details  Name: Randall Coleman MRN: [id number] Date of Birth: Nov 18, 1950  [facility] CM/SW Contact:    Marilu Favre, RN Phone Number: 04/08/2021, 11:51 AM  Clinical Narrative:                    Patient currently in surgery. Received a secure chat from MD asking to arrange assistance at home( patient lives alone).   Patient has medicare A and B which will cover HHPT/OT if recommended and ordered.   NCM called [facility] regarding aides / personnel assistance.   PCP is DR Landry Mellow fax: [phone]  Social worker is Abundio Miu , NCM left social message. Once operate report complete will fax to [facility] PCP.   [facility] will need post op PT notes   NCM will continue to follow for recommendations.   [place] social worker [phone] ext [phone] returned call. Patient does not met criteria for their home maker home health aide program, even if he did they are 4 weeks out due to staffing. Manuela Schwartz will send Dr Landry Mellow message asking for approval for HHRN,PT,OT and aide. Will need post op PT/OT evaluations. Once received NCM will fax information to Dr Ronnald Ramp for approval.   Anderson Malta with [facility] accepted referral if patient approves.Patient also has medicare. Patient currently in PACU . NCM will discuss with patient.   Manuela Schwartz wants to be called with update, DC date etc.   However, according to [place] worker is Cyndee Brightly ext [phone]   Patient Goals and CMS Choice        Expected Discharge Plan and Services                                                Prior Living Arrangements/Services                       Activities of Daily Living Home Assistive Devices/Equipment: None ADL Screening (condition at time of admission) Patient's cognitive ability adequate to safely complete daily activities?: Yes Is the patient deaf or have difficulty hearing?: No Does the  patient have difficulty seeing, even when wearing glasses/contacts?: No Does the patient have difficulty concentrating, remembering, or making decisions?: No Patient able to express need for assistance with ADLs?: Yes Does the patient have difficulty dressing or bathing?: No Independently performs ADLs?: Yes (appropriate for developmental age) Does the patient have difficulty walking or climbing stairs?: No Weakness of Legs: None Weakness of Arms/Hands: None  Permission Sought/Granted                  Emotional Assessment              Admission diagnosis:  Osteomyelitis ([place]) [M86.9] Osteomyelitis of left foot, unspecified type ([place]) [M86.9] Patient Active Problem List   Diagnosis Date Noted   Osteomyelitis ([place]) 04/06/2021   Osteomyelitis of second toe of left foot ([place])    Abscess of left great toe    Acute osteomyelitis ([place]) 07/29/2020   Major depressive disorder 12/15/2018   Anxiety 12/15/2018   Foot fracture, right 12/11/2018   Foreign body in lung 12/10/2018   Syncope and collapse 12/10/2018   Foot injury, right, initial encounter 12/10/2018   Essential hypertension 12/10/2018  History of DVT of lower extremity 12/10/2018   Type 2 diabetes mellitus without complication ([place]) 34/04/7094   Unspecified atrial fibrillation ([place]) 12/10/2018   PCP:  Pcp, No Pharmacy:   [id number] [place]Phone: [phone]: [address]Phone: [phone]: [phone]    Social Determinants of Health (SDOH) Interventions    Readmission Risk Interventions No flowsheet data found.

## 2021-04-08 NOTE — Progress Notes (Addendum)
° °  Subjective: No acute overnight events.   Patient was seen at bedside during rounds today. He reports feeling well after surgery yesterday. Complains of some pain s/p amputation. He has no other complaints. Anticipate DC tmrw, pt aware.   Pt is updated on the plan for today, and all questions and concerns are addressed.   Objective:  Vital signs in last 24 hours: Vitals:   04/08/21 1500 04/08/21 1515 04/08/21 1530 04/08/21 1548  BP: 124/69 140/71 (!) 124/58 (!) 142/82  Pulse: 89 (!) 57 87 88  Resp: 18 14 17 17   Temp:    97.7 F (36.5 C)  TempSrc:    Oral  SpO2: 100% 97% 100% 100%  Weight:      Height:       General: chronically ill appearing, no distress Eyes: no scleral icterus or conjunctival injection Cardiac: RRR, no LE edema  Pulm: breathing comfortably on room air, lung sounds clear GI: soft, non-tender MSK: normal muscle bulk and tone. Prior left great toe amputation. Skin: purulent left 2nd toe. No surrounding erythema.  Neuro: alert, oriented x4  Assessment/Plan:  Principal Problem:   Osteomyelitis ([place]) Active Problems:   Osteomyelitis of second toe of left foot (HCC)  Osteomyelitis of the left second toe Cognitive impairment S/p left second toe amputation yesterday. BC with NGTD. TOC consulted for assistance in postop care as pt lives alone and has no help. PT recommending HHPT.  - Continue Zosyn for today and then dc - Follow up on blood cultures - Follow up surgical path - Follow up with Dr Sharol Given in 1 week  - Maintain CBGs <180 for wound healing (see below) - oxycodone 10 mg q4h prn on board  - TOC consulted     Type 2 diabetes mellitus A1c 7.9. Holding home metformin and glipizide. CBGs <180 for wound healing postop. - Semglee 10 U - Novolog 4U increased to 6U tid  - SSI  - CBG monitoring  - Will consider starting SGLT2 inhibitor at discharge   Chronic mild asymptomatic hyponatremia Correct Na improved to 136 - Trend  BMP  Hypertension Chronic and stable   - Continue lisinopril  History of recurrent DVT Holding Eliquis for amputation today   Insomnia - Continue trazodone  Tobacco use disorder - Nicotine patch     Best Practice: Diet: Normal IVF: None,None VTE: Levenox  Code: DNR   Lajean Manes, MD  Internal Medicine Resident, PGY-1 Pager: [phone] After 5pm on weekdays and 1pm on weekends: On Call pager [phone]

## 2021-04-08 NOTE — Progress Notes (Signed)
Orthopedic Tech Progress Note Patient Details:  Randall Coleman [birth year] [id number]  Ortho Devices Type of Ortho Device: Postop shoe/boot Ortho Device/Splint Location: RLE Ortho Device/Splint Interventions: Ordered, Application   Post Interventions Patient Tolerated: Well Instructions Provided: [facility] [place] 04/08/2021, 2:44 PM

## 2021-04-08 NOTE — Interval H&P Note (Signed)
History and Physical Interval Note:  04/08/2021 6:45 AM  Randall Coleman  has presented today for surgery, with the diagnosis of Osteomyelitis Left 2nd Toe.  The various methods of treatment have been discussed with the patient and family. After consideration of risks, benefits and other options for treatment, the patient has consented to  Procedure(s): AMPUTATION LEFT 2ND TOE (Left) as a surgical intervention.  The patient's history has been reviewed, patient examined, no change in status, stable for surgery.  I have reviewed the patient's chart and labs.  Questions were answered to the patient's satisfaction.     [name]

## 2021-04-08 NOTE — Anesthesia Postprocedure Evaluation (Signed)
Anesthesia Post Note  Patient: Randall Coleman  Procedure(s) Performed: AMPUTATION LEFT 2ND TOE (Left: Second Toe)     Patient location during evaluation: PACU Anesthesia Type: Regional Level of consciousness: awake and alert Pain management: pain level controlled Vital Signs Assessment: post-procedure vital signs reviewed and stable Respiratory status: spontaneous breathing, nonlabored ventilation and respiratory function stable Cardiovascular status: blood pressure returned to baseline Postop Assessment: no apparent nausea or vomiting Anesthetic complications: no   No notable events documented.  Last Vitals:  Vitals:   04/08/21 1530 04/08/21 1548  BP: (!) 124/58 (!) 142/82  Pulse: 87 88  Resp: 17 17  Temp:  36.5 C  SpO2: 100% 100%    Last Pain:  Vitals:   04/08/21 1548  TempSrc: Oral  PainSc:                  [name]

## 2021-04-08 NOTE — Transfer of Care (Signed)
Immediate Anesthesia Transfer of Care Note  Patient: Randall Coleman  Procedure(s) Performed: AMPUTATION LEFT 2ND TOE (Left: Second Toe)  Patient Location: PACU  Anesthesia Type:MAC and Regional  Level of Consciousness: awake, alert  and oriented  Airway & Oxygen Therapy: Patient Spontanous Breathing  Post-op Assessment: Report given to RN and Post -op Vital signs reviewed and stable  Post vital signs: Reviewed and stable  Last Vitals:  Vitals Value Taken Time  BP 128/84 04/08/21 1357  Temp    Pulse 89 04/08/21 1357  Resp 13 04/08/21 1357  SpO2 88 % 04/08/21 1357  Vitals shown include unvalidated device data.  Last Pain:  Vitals:   04/08/21 1137  TempSrc: Oral  PainSc:       Patients Stated Pain Goal: 4 (36/85/99 2341)  Complications: No notable events documented.

## 2021-04-09 ENCOUNTER — Encounter (HOSPITAL_COMMUNITY): Payer: Self-pay | Admitting: Orthopedic Surgery

## 2021-04-09 LAB — CBC
HCT: 30.5 % — ABNORMAL LOW (ref 39.0–52.0)
Hemoglobin: 10.5 g/dL — ABNORMAL LOW (ref 13.0–17.0)
MCH: 29.5 pg (ref 26.0–34.0)
MCHC: 34.4 g/dL (ref 30.0–36.0)
MCV: 85.7 fL (ref 80.0–100.0)
Platelets: 213 10*3/uL (ref 150–400)
RBC: 3.56 MIL/uL — ABNORMAL LOW (ref 4.22–5.81)
RDW: 12.7 % (ref 11.5–15.5)
WBC: 5.8 10*3/uL (ref 4.0–10.5)
nRBC: 0 % (ref 0.0–0.2)

## 2021-04-09 LAB — BASIC METABOLIC PANEL
Anion gap: 7 (ref 5–15)
BUN: 6 mg/dL — ABNORMAL LOW (ref 8–23)
CO2: 28 mmol/L (ref 22–32)
Calcium: 8.2 mg/dL — ABNORMAL LOW (ref 8.9–10.3)
Chloride: 97 mmol/L — ABNORMAL LOW (ref 98–111)
Creatinine, Ser: 0.81 mg/dL (ref 0.61–1.24)
GFR, Estimated: >60 mL/min (ref >60)
Glucose, Bld: 280 mg/dL — ABNORMAL HIGH (ref 70–99)
Potassium: 4.3 mmol/L (ref 3.5–5.1)
Sodium: 132 mmol/L — ABNORMAL LOW (ref 135–145)

## 2021-04-09 LAB — GLUCOSE, CAPILLARY
Glucose-Capillary: 175 mg/dL — ABNORMAL HIGH (ref 70–99)
Glucose-Capillary: 290 mg/dL — ABNORMAL HIGH (ref 70–99)
Glucose-Capillary: 332 mg/dL — ABNORMAL HIGH (ref 70–99)
Glucose-Capillary: 89 mg/dL (ref 70–99)
Glucose-Capillary: 167 mg/dL — ABNORMAL HIGH (ref 70–99)

## 2021-04-09 MED ORDER — INSULIN ASPART 100 UNIT/ML IJ SOLN
7.0000 [IU] | Freq: Three times a day (TID) | INTRAMUSCULAR | Status: DC
  Administered 2021-04-10: 7 [IU] via SUBCUTANEOUS

## 2021-04-09 MED ORDER — METFORMIN HCL 500 MG PO TABS
1000.0000 mg | ORAL_TABLET | Freq: Two times a day (BID) | ORAL | Status: DC
  Administered 2021-04-09 – 2021-04-10 (×2): 1000 mg via ORAL
  Filled 2021-04-09 (×2): qty 2

## 2021-04-09 MED ORDER — ACETAMINOPHEN 500 MG PO TABS
1000.0000 mg | ORAL_TABLET | Freq: Three times a day (TID) | ORAL | Status: DC
  Administered 2021-04-09 – 2021-04-10 (×2): 1000 mg via ORAL
  Filled 2021-04-09 (×2): qty 2

## 2021-04-09 MED ORDER — APIXABAN 5 MG PO TABS
5.0000 mg | ORAL_TABLET | Freq: Two times a day (BID) | ORAL | Status: DC
  Administered 2021-04-10: 5 mg via ORAL
  Filled 2021-04-09: qty 1

## 2021-04-09 MED ORDER — INSULIN ASPART 100 UNIT/ML IJ SOLN: 6.0000 [IU] | Freq: Three times a day (TID) | INTRAMUSCULAR | Status: DC

## 2021-04-09 NOTE — Evaluation (Signed)
Physical Therapy Evaluation Patient Details Name: Randall Coleman MRN: [id number] DOB: 12-14-1950 Today's Date: 04/09/2021  History of Present Illness  Pt is a 71 y.o. 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   Clinical Impression  Pt presents with an overall decrease in functional mobility secondary to above. PTA, pt independent, drives, lives alone; reports no one available to assist at home. Educ re: L foot precautions, positioning, post-op shoe wear, activity recommendations. Today, pt able to initiate transfer and gait training with RW; pt non-compliant with L foot TDWB precautions despite repeated education and cues for sequencing. Discussed w/c use to limit WB through L foot and potentially allow better wound healing, pt declines. Pt would benefit from continued acute PT services to maximize functional mobility and independence prior to d/c with HHPT services.      Recommendations for follow up therapy are one component of a multi-disciplinary discharge planning process, led by the attending physician.  Recommendations may be updated based on patient status, additional functional criteria and insurance authorization.  Follow Up Recommendations Home health PT    Assistance Recommended at Discharge Intermittent Supervision/Assistance  Patient can return home with the following  Assistance with cooking/housework;Assist for transportation;Help with stairs or ramp for entrance    Equipment Recommendations None recommended by PT (declined w/c)  Recommendations for Other Services       Functional Status Assessment Patient has had a recent decline in their functional status and demonstrates the ability to make significant improvements in function in a reasonable and predictable amount of time.     Precautions / Restrictions Precautions Precautions:  Fall Restrictions Weight Bearing Restrictions: Yes LLE Weight Bearing: Touchdown weight bearing Other Position/Activity Restrictions: in post-op shoe      Mobility  Bed Mobility Overal bed mobility: Modified Independent             General bed mobility comments: HOB slightly elevated    Transfers Overall transfer level: Needs assistance Equipment used: Rolling walker (2 wheels) Transfers: Sit to/from Stand, Bed to chair/wheelchair/BSC Sit to Stand: Supervision   Step pivot transfers: Min guard       General transfer comment: Multiple sit<>stands to RW from EOB and recliner, pt continues to put weight through L foot despite cues; poor safety with RW requiring cues for sequencing, to maintain closer proximity to walker; pt tried a couple hops on R foot with RW, but reports, "I can't do that" and opts to walk with WB through LLE; deferred further ambulation secondary to inability to keep weight off LLE    Ambulation/Gait                  Stairs            Wheelchair Mobility    Modified Rankin (Stroke Patients Only)       Balance Overall balance assessment: Needs assistance   Sitting balance-Leahy Scale: Good Sitting balance - Comments: indep to don R sock sitting EOB   Standing balance support: Reliant on assistive device for balance Standing balance-Leahy Scale: Poor                               Pertinent Vitals/Pain Pain Assessment Pain Assessment: Faces Faces Pain Scale: Hurts even more Pain Location: L foot Pain Descriptors / Indicators: Discomfort, Grimacing, Guarding Pain Intervention(s): Monitored during  session, Limited activity within patient's tolerance, RN gave pain meds during session    [place] expects to be discharged to:: Private residence Living Arrangements: Alone Available Help at Discharge:  (reports no one) Type of Home: Apartment Home Access: Stairs to enter Entrance Stairs-Rails:  Right Entrance Stairs-Number of Steps: 2-3 or none   Home Layout: One level Home Equipment: Conservation officer, nature (2 wheels);Cane - single point Additional Comments: reports no one available to assist    Prior Function Prior Level of Function : Independent/Modified Independent;Driving             Mobility Comments: Independent without DME, drives ADLs Comments: typically does microwave meals, stands to shower     Hand Dominance        Extremity/Trunk Assessment   Upper Extremity Assessment Upper Extremity Assessment: Overall WFL for tasks assessed    Lower Extremity Assessment Lower Extremity Assessment: Generalized weakness;LLE deficits/detail LLE Deficits / Details: s/p L 2nd toe amputation; hip and knee functionally at least 3/5    Cervical / Trunk Assessment Cervical / Trunk Assessment: Kyphotic  Communication   Communication: No difficulties  Cognition Arousal/Alertness: Awake/alert Behavior During Therapy: WFL for tasks assessed/performed Overall Cognitive Status: No family/caregiver present to determine baseline cognitive functioning Area of Impairment: Attention, Following commands, Awareness, Safety/judgement                   Current Attention Level: Selective   Following Commands: Follows one step commands inconsistently Safety/Judgement: Decreased awareness of deficits, Decreased awareness of safety Awareness: Emergent   General Comments: WFL for majority of simple tasks, though apparent decreased attention and decreased awareness - pt continues to use L foot WB with standing activity despite repeated cues/educ on TDWB precautions        General Comments General comments (skin integrity, edema, etc.): increased time discussing safe d/c plan, DME needs and fall risk reduction. pt declines wheelchair use which would limited WB through L foot to allow healing; pt declines, reports it won't fit in apt and he plans to walk    Exercises      Assessment/Plan    PT Assessment Patient needs continued PT services  PT Problem List Decreased strength;Decreased activity tolerance;Decreased balance;Decreased mobility;Decreased cognition;Decreased knowledge of use of DME;Decreased knowledge of precautions;Decreased safety awareness;Pain       PT Treatment Interventions DME instruction;Gait training;Stair training;Functional mobility training;Therapeutic activities;Therapeutic exercise;Balance training;Patient/family education    PT Goals (Current goals can be found in the Care Plan section)  Acute Rehab PT Goals Patient Stated Goal: have more help at home PT Goal Formulation: With patient Time For Goal Achievement: 04/23/21 Potential to Achieve Goals: Good    Frequency Min 3X/week     Co-evaluation               AM-PAC PT "6 Clicks" Mobility  Outcome Measure Help needed turning from your back to your side while in a flat bed without using bedrails?: None Help needed moving from lying on your back to sitting on the side of a flat bed without using bedrails?: None Help needed moving to and from a bed to a chair (including a wheelchair)?: A Little Help needed standing up from a chair using your arms (e.g., wheelchair or bedside chair)?: A Little Help needed to walk in hospital room?: A Little Help needed climbing 3-5 steps with a railing? : A Little 6 Click Score: 20    End of Session Equipment Utilized During Treatment: Gait belt Activity Tolerance: Patient  tolerated treatment well;Patient limited by pain Patient left: in chair;with call bell/phone within reach;with chair alarm set;with nursing/sitter in room Nurse Communication: Mobility status PT Visit Diagnosis: Other abnormalities of gait and mobility (R26.89);Pain;Muscle weakness (generalized) (M62.81)    Time: 2440-1027 PT Time Calculation (min) (ACUTE ONLY): 20 min   Charges:   PT Evaluation $PT Eval Moderate Complexity: [place],  PT, DPT Acute Rehabilitation Services  Pager [phone] Office [phone]  Derry Lory 04/09/2021, 9:22 AM

## 2021-04-09 NOTE — Evaluation (Addendum)
Occupational Therapy Evaluation Patient Details Name: Randall Coleman MRN: [id number] DOB: June 19, 1950 Today's Date: 04/09/2021   History of Present Illness Pt is a 71 y.o. 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   Clinical Impression   Pt was functioning independently at home and driving prior to admission. Presents with pain, impaired cognition (likely baseline) and decreased balance. He has difficulty maintaining TDWB on his L foot and problem solving in order to generalize in ADLs and IADLs. Pt has no social support at home.      Recommendations for follow up therapy are one component of a multi-disciplinary discharge planning process, led by the attending physician.  Recommendations may be updated based on patient status, additional functional criteria and insurance authorization.   Follow Up Recommendations  Home health OT    Assistance Recommended at Discharge PRN  Patient can return home with the following Assistance with cooking/housework;Assist for transportation    Functional Status Assessment  Patient has had a recent decline in their functional status and demonstrates the ability to make significant improvements in function in a reasonable and predictable amount of time.  Equipment Recommendations  BSC/3in1    Recommendations for Other Services       Precautions / Restrictions Precautions Precautions: Fall Required Braces or Orthoses: Other Brace Other Brace: post op shoe Restrictions Weight Bearing Restrictions: Yes LLE Weight Bearing: Touchdown weight bearing Other Position/Activity Restrictions: in post-op shoe      Mobility Bed Mobility               General bed mobility comments: pt in chair    Transfers Overall transfer level: Needs assistance Equipment used: Rolling walker (2 wheels) Transfers: Sit to/from Stand Sit to  Stand: Supervision           General transfer comment: pt with difficulty maintaining TDWB status despite maximum attempts to educate in technique      Balance Overall balance assessment: Needs assistance   Sitting balance-Leahy Scale: Good     Standing balance support: Reliant on assistive device for balance Standing balance-Leahy Scale: Poor                             ADL either performed or assessed with clinical judgement   ADL Overall ADL's : Needs assistance/impaired Eating/Feeding: Independent;Sitting   Grooming: Min guard;Standing;Wash/dry hands   Upper Body Bathing: Set up;Sitting   Lower Body Bathing: Set up;Sitting/lateral leans   Upper Body Dressing : Set up;Sitting   Lower Body Dressing: Set up;Sitting/lateral leans   Toilet Transfer: Min guard;Ambulation;Comfort height toilet;Rolling walker (2 wheels);Grab bars Toilet Transfer Details (indicate cue type and reason): educated in use of 3 in 1 over toilet Toileting- Clothing Manipulation and Hygiene: Set up;Sitting/lateral lean       Functional mobility during ADLs: Min guard;Rolling walker (2 wheels)       Vision Ability to See in Adequate Light: 0 Adequate       Perception     Praxis      Pertinent Vitals/Pain Pain Assessment Pain Assessment: Faces Faces Pain Scale: Hurts even more Pain Location: L foot Pain Descriptors / Indicators: Discomfort, Grimacing, Guarding Pain Intervention(s): RN gave pain meds during session, Monitored during session     Hand Dominance Right   Extremity/Trunk Assessment Upper Extremity Assessment Upper Extremity Assessment: Overall WFL for tasks assessed  Lower Extremity Assessment Lower Extremity Assessment: Defer to PT evaluation LLE Deficits / Details: s/p L 2nd toe amputation; hip and knee functionally at least 3/5   Cervical / Trunk Assessment Cervical / Trunk Assessment: Kyphotic   Communication Communication Communication: No  difficulties   Cognition Arousal/Alertness: Awake/alert Behavior During Therapy: WFL for tasks assessed/performed Overall Cognitive Status: Impaired/Different from baseline Area of Impairment: Attention, Following commands, Awareness, Safety/judgement, Memory                   Current Attention Level: Selective Memory: Decreased short-term memory, Decreased recall of precautions Following Commands: Follows one step commands inconsistently Safety/Judgement: Decreased awareness of safety, Decreased awareness of deficits Awareness: Emergent   General Comments: Pt needing repetition of instruction in compensatory strategies for maintaining TDWB.     General Comments  increased time discussing safe d/c plan, DME needs and fall risk reduction. pt declines wheelchair use which would limited WB through L foot to allow healing; pt declines, reports it won't fit in apt and he plans to walk    Exercises     Shoulder Instructions      Home Living Family/patient expects to be discharged to:: Private residence Living Arrangements: Alone Available Help at Discharge:  (reports no one) Type of Home: Apartment Home Access: Level entry Entrance Stairs-Number of Steps: 2-3 or none Entrance Stairs-Rails: Right Home Layout: One level     Bathroom Shower/Tub: Teacher, early years/pre: Standard     Home Equipment: Conservation officer, nature (2 wheels);Cane - single point   Additional Comments: reports no one available to assist      Prior Functioning/Environment Prior Level of Function : Independent/Modified Independent;Driving             Mobility Comments: Independent without DME, drives ADLs Comments: typically does microwave meals, stands to shower        OT Problem List: Impaired balance (sitting and/or standing);Decreased knowledge of use of DME or AE;Decreased cognition;Pain      OT Treatment/Interventions: Self-care/ADL training;DME and/or AE instruction;Therapeutic  activities;Patient/family education;Balance training;Cognitive remediation/compensation    OT Goals(Current goals can be found in the care plan section) Acute Rehab OT Goals OT Goal Formulation: With patient Time For Goal Achievement: 04/23/21 Potential to Achieve Goals: Fair ADL Goals Pt Will Perform Grooming: with modified independence;standing Pt Will Transfer to Toilet: with modified independence;ambulating;bedside commode (over toilet) Pt Will Perform Toileting - Clothing Manipulation and hygiene: with modified independence;sitting/lateral leans Additional ADL Goal #1: Pt will maintain TDWB on LLE during ADL and mobility.  OT Frequency: Min 2X/week    Co-evaluation              AM-PAC OT "6 Clicks" Daily Activity     Outcome Measure Help from another person eating meals?: None Help from another person taking care of personal grooming?: A Little Help from another person toileting, which includes using toliet, bedpan, or urinal?: A Little Help from another person bathing (including washing, rinsing, drying)?: A Little Help from another person to put on and taking off regular upper body clothing?: A Little Help from another person to put on and taking off regular lower body clothing?: A Little 6 Click Score: 19   End of Session Equipment Utilized During Treatment: Rolling walker (2 wheels);Gait belt;Other (comment) (post op shoe)  Activity Tolerance: Patient tolerated treatment well Patient left: in chair;with call bell/phone within reach;with chair alarm set  OT Visit Diagnosis: Unsteadiness on feet (R26.81);Other abnormalities of gait and mobility (R26.89);Pain;Other symptoms  and signs involving cognitive function                Time: 1000-1030 OT Time Calculation (min): 30 min Charges:  OT General Charges $OT Visit: 1 Visit OT Evaluation $OT Eval Moderate Complexity: 1 Mod OT Treatments $Self Care/Home Management : 8-22 mins Nestor Lewandowsky, OTR/L Acute  Rehabilitation Services Pager: [phone] Office: [phone]   Malka So 04/09/2021, 10:49 AM

## 2021-04-09 NOTE — Progress Notes (Signed)
Inpatient Diabetes Program Recommendations  AACE/ADA: New Consensus Statement on Inpatient Glycemic Control (2015)  Target Ranges:  Prepandial:   less than 140 mg/dL      Peak postprandial:   less than 180 mg/dL (1-2 hours)      Critically ill patients:  140 - 180 mg/dL   Lab Results  Component Value Date   GLUCAP 332 (H) 04/09/2021   HGBA1C 7.9 (H) 04/06/2021    Review of Glycemic Control  Latest Reference Range & Units 04/08/21 16:40 04/08/21 20:54 04/09/21 07:44 04/09/21 12:09  Glucose-Capillary 70 - 99 mg/dL 164 (H) 175 (H) 290 (H) 332 (H)  (H): Data is abnormally high Diabetes history: T2DM Outpatient Diabetes medications: Metformin/Glipizide Current orders for Inpatient glycemic control: Semglee 10 units QD, Novolog 4 units TID, Novolog 0-15 units TID  Inpatient Diabetes Program Recommendations:    Consider increasing meal coverage to Novolog 6 units TID (assuming pt is consuming >50% of meals).   Thanks, Bronson Curb, MSN, RNC-OB Diabetes Coordinator [phone] (8a-5p)

## 2021-04-09 NOTE — TOC Progression Note (Addendum)
Transition of Care [facility]) - Progression Note    Patient Details  Name: Randall Coleman MRN: [id number] Date of Birth: [birth year]  [facility] CM/SW Contact  Jacalyn Lefevre Edson Snowball, RN Phone Number: 04/09/2021, 10:39 AM  Clinical Narrative:     Discharge planned for tomorrow. Patient states he has no transportation home. NCM discussed cab with patient, MD and PT. PT is OK with patient going home in cab if he has his walker. His walker is currently at home. Patient states he has NO one to get the walker for him or no one to drive him home tomorrow.   Patient believes he got the walker through the [place]. NCM asked if he wanted NCM to see if he can get a walker through medicare. Patient agreed. Also discussed 3 in1 with patient and OT. Patient in agreement. 3 in 1 to be delivered to his home . Left Freda Munro with [employer] a message.   Patient asking about personnel care services. [facility] has [facility] and [facility], but explained if he is approved [facility] stated it takes 4 weeks to arrange staff.   PAtient asking to have groceries delivered to his home. He states there is a Paediatric nurse close to him. [employer] does deliver groceries , he can arrange . Patient states he has a credit card but does not like to use it. OT went over how he can maintain his weight bearing status and go grocery shopping.      Anderson Malta with [facility] aware discharge planned for tomorrow. Dr Posey Pronto will enter orders for HHRN,PT,OT, aide, and face to face. NCM will fax once received.  Sister paid for walker   Home health orders, updated notes faxed to [place] .   Anderson Malta with [facility] aware discharge planned for tomorrow.   Called [facility] transportation . PAtient already in their system . Tomorrow when patient is ready to discharge, nurse just needs to call [phone].   Expected Discharge Plan and Services                                                 Social Determinants of Health (SDOH) Interventions     Readmission Risk Interventions No flowsheet data found.

## 2021-04-09 NOTE — Progress Notes (Signed)
Patient ID: Randall Coleman, male   DOB: [birth year], 71 y.o.   MRN: [id number] Patient is a 71 year old gentleman who is postop day 1 second toe amputation.  The dressing is dry recommended minimizing weightbearing on the foot until the wound completely heals.  Patient should be in a postoperative shoe for gait training.  Recommend discontinuing antibiotics after today discharge to home when safe with therapy.

## 2021-04-10 ENCOUNTER — Other Ambulatory Visit (HOSPITAL_COMMUNITY): Payer: Self-pay

## 2021-04-10 LAB — CBC
HCT: 30.3 % — ABNORMAL LOW (ref 39.0–52.0)
Hemoglobin: 10.4 g/dL — ABNORMAL LOW (ref 13.0–17.0)
MCH: 29.6 pg (ref 26.0–34.0)
MCHC: 34.3 g/dL (ref 30.0–36.0)
MCV: 86.3 fL (ref 80.0–100.0)
Platelets: 194 10*3/uL (ref 150–400)
RBC: 3.51 MIL/uL — ABNORMAL LOW (ref 4.22–5.81)
RDW: 12.6 % (ref 11.5–15.5)
WBC: 5.3 10*3/uL (ref 4.0–10.5)
nRBC: 0 % (ref 0.0–0.2)

## 2021-04-10 LAB — BASIC METABOLIC PANEL
Anion gap: 8 (ref 5–15)
BUN: 8 mg/dL (ref 8–23)
CO2: 28 mmol/L (ref 22–32)
Calcium: 8.4 mg/dL — ABNORMAL LOW (ref 8.9–10.3)
Chloride: 98 mmol/L (ref 98–111)
Creatinine, Ser: 0.82 mg/dL (ref 0.61–1.24)
GFR, Estimated: >60 mL/min (ref >60)
Glucose, Bld: 239 mg/dL — ABNORMAL HIGH (ref 70–99)
Potassium: 4 mmol/L (ref 3.5–5.1)
Sodium: 134 mmol/L — ABNORMAL LOW (ref 135–145)

## 2021-04-10 LAB — GLUCOSE, CAPILLARY: Glucose-Capillary: 217 mg/dL — ABNORMAL HIGH (ref 70–99)

## 2021-04-10 MED ORDER — EMPAGLIFLOZIN 10 MG PO TABS
10.0000 mg | ORAL_TABLET | Freq: Every day | ORAL | 0 refills | Status: DC
  Filled 2021-04-10: qty 30, 30d supply, fill #0

## 2021-04-10 MED ORDER — OXYCODONE HCL 10 MG PO TABS
10.0000 mg | ORAL_TABLET | Freq: Four times a day (QID) | ORAL | 0 refills | Status: AC | PRN
  Filled 2021-04-10: qty 28, 7d supply, fill #0

## 2021-04-10 MED ORDER — ACETAMINOPHEN 500 MG PO TABS
1000.0000 mg | ORAL_TABLET | Freq: Three times a day (TID) | ORAL | 0 refills | Status: DC
  Filled 2021-04-10: qty 30, 5d supply, fill #0

## 2021-04-10 MED ORDER — BISACODYL 5 MG PO TBEC
5.0000 mg | DELAYED_RELEASE_TABLET | Freq: Every day | ORAL | 0 refills | Status: DC | PRN
  Filled 2021-04-10: qty 30, 30d supply, fill #0

## 2021-04-10 MED ORDER — DOCUSATE SODIUM 100 MG PO CAPS
100.0000 mg | ORAL_CAPSULE | Freq: Every day | ORAL | 0 refills | Status: DC
  Filled 2021-04-10: qty 10, 10d supply, fill #0

## 2021-04-10 MED ORDER — POLYETHYLENE GLYCOL 3350 17 GM/SCOOP PO POWD
17.0000 g | Freq: Every day | ORAL | 0 refills | Status: DC | PRN
  Filled 2021-04-10: qty 238, 14d supply, fill #0

## 2021-04-10 NOTE — Discharge Summary (Signed)
Name: Randall Coleman MRN: [id number] DOB: [birth year] 71 y.o. PCP: Pcp, No  Date of Admission: 04/06/2021  9:45 AM Date of Discharge:  04/10/21 Attending Physician: Dr. Dareen Piano  DISCHARGE DIAGNOSIS:  Primary Problem: Osteomyelitis [facility])   Hospital Problems: Principal Problem:   Osteomyelitis ([place]) Active Problems:   Osteomyelitis of second toe of left foot ([place])    DISCHARGE MEDICATIONS:   Allergies as of 04/10/2021   No Known Allergies      Medication List     STOP taking these medications    glipiZIDE 5 MG tablet Commonly known as: GLUCOTROL       TAKE these medications    acetaminophen 500 MG tablet Commonly known as: TYLENOL Take 2 tablets (1,000 mg total) by mouth every 8 (eight) hours.   apixaban 5 MG Tabs tablet Commonly known as: ELIQUIS Take 5 mg by mouth 2 (two) times daily.   ARIPiprazole 20 MG tablet Commonly known as: ABILIFY Take 0.5 tablets (10 mg total) by mouth daily.   benztropine 1 MG tablet Commonly known as: COGENTIN Take 1 mg by mouth daily.   bisacodyl 5 MG EC tablet Commonly known as: DULCOLAX Take 1 tablet (5 mg total) by mouth daily as needed for moderate constipation.   docusate sodium 100 MG capsule Commonly known as: COLACE Take 1 capsule (100 mg total) by mouth daily.   empagliflozin 10 MG Tabs tablet Commonly known as: Jardiance Take 1 tablet (10 mg total) by mouth daily before breakfast.   gabapentin 300 MG capsule Commonly known as: NEURONTIN Take 300 mg by mouth 3 (three) times daily.   lisinopril 10 MG tablet Commonly known as: ZESTRIL Take 1 tablet (10 mg total) by mouth daily. What changed: when to take this   Magnesium Oxide 420 (252 Mg) MG Tabs Take 420 mg by mouth daily after breakfast.   metFORMIN 1000 MG tablet Commonly known as: GLUCOPHAGE Take 1,000 mg by mouth 2 (two) times daily after a meal.   multivitamin with minerals Tabs tablet Take 1 tablet by mouth every morning.   nicotine 21  mg/24hr patch Commonly known as: NICODERM CQ - dosed in mg/24 hours Place 1 patch (21 mg total) onto the skin daily.   omeprazole 20 MG capsule Commonly known as: PRILOSEC Take 20 mg by mouth 2 (two) times daily.   Oxycodone HCl 10 MG Tabs Take 1 tablet (10 mg total) by mouth every 6 (six) hours as needed for up to 7 days for severe pain.   polyethylene glycol 17 g packet Commonly known as: MIRALAX / GLYCOLAX Take 17 g by mouth daily as needed for mild constipation.   traZODone 100 MG tablet Commonly known as: DESYREL Take 1 tablet (100 mg total) by mouth at bedtime. What changed:  how much to take when to take this reasons to take this   Vitamin B 12 500 MCG Tabs Take 1,000 mcg by mouth every morning.               Durable Medical Equipment  (From admission, onward)           Start     Ordered   04/09/21 1035  For home use only DME Walker rolling  Once       Question Answer Comment  Walker: With 5 Inch Wheels   Patient needs a walker to treat with the following condition Weakness      04/09/21 1034   04/09/21 1034  For home use only DME 3 n  1  Once        04/09/21 1034              Discharge Care Instructions  (From admission, onward)           Start     Ordered   04/10/21 0000  Change dressing (specify)       Comments: Change dressing as needed   04/10/21 0825            DISPOSITION AND FOLLOW-UP:  [name] was discharged from [facility] in stable condition. At the hospital follow up visit please address:  Follow-up Recommendations: Consults: none  Labs: CBC and BMP at PCP follow up  Studies: none  Medications: Added Jardiance 10 mg, Stopped Glipizide  Follow-up Appointments:  Follow-up Information     [name], MD Follow up in 1 week(s).   Specialty: Orthopedic Surgery Contact information: [place] [address] [phone]         [facility] Follow up.    Contact information: for transportation to [facility] appointments. [place]:  Patient Summary: Osteomyelitis of the left second toe Cognitive impairment Did not meet sepsis criteria on admission. No systemic signs of infection at this time. HDS, no leukocytosis, and afebrile. ABI's showing normal pulses, and DP pulses 2+ bilaterally. BC negative. Pt understand diagnoses and tx plan. He lives alone and does not have help for post-op care; [facility] consulted for assistance. Zosyn started. Orthopedics consulted, left second toe amputation done on 04/08/21. Abx continued for an additional day. Pt discharged home with home health services. Discharged with pain medications.    Chronic mild asymptomatic hyponatremia Correct Na improved to 135    Hypertension Chronic and stable - Continues lisinopril  History of recurrent DVT Holding Eliquis for amputation tmrw   Type 2 diabetes mellitus A1c 7.9. Held home metformin and glipizide. Will need to control blood sugars to <180 for wound healing postop. Optimized his medication regimen at discharge to include Jardiance 10 mg and metformin. Discontinued Glipizide.  - SSI during hospitalization  - CBG monitoring during hospitalization   Insomnia - Continued trazodone  Tobacco use disorder - Nicotine patch    DISCHARGE INSTRUCTIONS:   Discharge Instructions     Call MD for:  difficulty breathing, headache or visual disturbances   Complete by: As directed    Call MD for:  extreme fatigue   Complete by: As directed    Call MD for:  hives   Complete by: As directed    Call MD for:  persistant dizziness or light-headedness   Complete by: As directed    Call MD for:  persistant nausea and vomiting   Complete by: As directed    Call MD for:  redness, tenderness, or signs of infection (pain, swelling, redness, odor or green/yellow discharge around incision site)   Complete by: As directed    Call MD for:   severe uncontrolled pain   Complete by: As directed    Call MD for:  temperature >100.4   Complete by: As directed    Change dressing (specify)   Complete by: As directed    Change dressing as needed   Diet - low sodium heart healthy   Complete by: As directed    Increase activity slowly   Complete by: As directed        SUBJECTIVE:  No acute overnight events. Patient was seen at bedside during rounds this morning. Pt reports feeling well this morning. Complains of some pain in leg. Pt denies fevers and chills. He understands that he has a 1 week follow up with Dr Sharol Given. He feels well over all. HHPT and HH orders placed.   No other complains or concerns at this time.   All questions were addressed with patient prior to being discharged.   Discharge Vitals:   BP (!) 122/59 (BP Location: Right Arm)    Pulse 71    Temp 97.6 F (36.4 C) (Oral)    Resp 17    Ht 6\' 2"  (1.88 m)    Wt 82.1 kg    SpO2 98%    BMI 23.24 kg/m   OBJECTIVE:   General: chronically ill appearing, no distress Eyes: no scleral icterus or conjunctival injection Cardiac: RRR, no LE edema  Pulm: breathing comfortably on room air, lung sounds clear GI: soft, non-tender MSK: normal muscle bulk and tone. Prior left great toe amputation. Skin: left foot wrapped, with no obvious drainage or discharge  Neuro: alert, oriented x4  Pertinent Labs, Studies, and Procedures:  CBC Latest Ref Rng & Units 04/10/2021 04/09/2021 04/08/2021  WBC 4.0 - 10.5 K/uL 5.3 5.8 5.8  Hemoglobin 13.0 - 17.0 g/dL 10.4(L) 10.5(L) 11.5(L)  Hematocrit 39.0 - 52.0 % 30.3(L) 30.5(L) 33.2(L)  Platelets 150 - 400 K/uL 194 213 217    CMP Latest Ref Rng & Units 04/10/2021 04/09/2021 04/08/2021  Glucose 70 - 99 mg/dL 239(H) 280(H) 261(H)  BUN 8 - 23 mg/dL 8 6(L) 14  Creatinine 0.61 - 1.24 mg/dL 0.82 0.81 0.87  Sodium 135 - 145 mmol/L 134(L) 132(L) 134(L)  Potassium 3.5 - 5.1 mmol/L 4.0 4.3 4.4  Chloride 98 - 111 mmol/L 98 97(L) 98  CO2 22 - 32 mmol/L  28 28 28   Calcium 8.9 - 10.3 mg/dL 8.4(L) 8.2(L) 8.6(L)  Total Protein 6.5 - 8.1 g/dL - - -  Total Bilirubin 0.3 - 1.2 mg/dL - - -  Alkaline Phos 38 - 126 U/L - - -  AST 15 - 41 U/L - - -  ALT 0 - 44 U/L - - -    DG Foot Complete Left  Result Date: 04/06/2021 CLINICAL DATA:  Second toe infection. EXAM: LEFT FOOT - COMPLETE 3+ VIEW COMPARISON:  Jul 29, 2020. FINDINGS: Status post surgical amputation of phalanges of first toe. There appears to be lytic destruction involving the distal portion of the second distal phalanx as well as the second middle phalanx. Overlying soft tissue ulceration of second toe is noted. Nearly complete resorption of second distal phalanx is noted. Mild posterior calcaneal spurring is noted. IMPRESSION: Lytic destruction is seen involving the phalanges of the second toe consistent with osteomyelitis. Electronically Signed   By: [name] M.D.   On: 04/06/2021 10:34      Lajean Manes, MD Internal Medicine Resident, PGY-1 Pager: [phone]

## 2021-04-10 NOTE — TOC Progression Note (Signed)
Transition of Care [facility]) - Progression Note    Patient Details  Name: Aharon Carriere MRN: [id number] Date of Birth: 11-Mar-1950  Transition of Care [facility]) CM/SW Contact  Jacalyn Lefevre Edson Snowball, RN Phone Number: 04/10/2021, 2:09 PM  Clinical Narrative:      Discharge summary faxed to Dr Ronnald Ramp at [facility]. Medications were filled through [facility]. Home Health arranged through [facility], Transportation home provided through [facility]       Expected Discharge Plan and Services           Expected Discharge Date: 04/10/21                                     Social Determinants of Health (SDOH) Interventions    Readmission Risk Interventions No flowsheet data found.

## 2021-04-10 NOTE — Progress Notes (Addendum)
Occupational Therapy Treatment Patient Details Name: Randall Coleman MRN: [id number] DOB: 1951-02-09 Today's Date: 04/10/2021   History of present illness Pt is a 71 y.o. 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   OT comments  Patient educated on overall safety with RW and TTWB along with overall ADL safety in home  environment.   Recommendations for follow up therapy are one component of a multi-disciplinary discharge planning process, led by the attending physician.  Recommendations may be updated based on patient status, additional functional criteria and insurance authorization.    Follow Up Recommendations    Home health OT   Assistance Recommended at Discharge  PRN  Patient can return home with the following      Equipment Recommendations    3-1   Recommendations for Other Services      Precautions / Restrictions Precautions Precautions: Fall Required Braces or Orthoses: Other Brace Other Brace: post op shoe Restrictions Weight Bearing Restrictions: Yes LLE Weight Bearing: Touchdown weight bearing (TTWB) Other Position/Activity Restrictions: in post-op shoe       Mobility Bed Mobility Overal bed mobility: Modified Independent                  Transfers Overall transfer level: Needs assistance Equipment used: Rolling walker (2 wheels) Transfers: Sit to/from Stand Sit to Stand: Supervision           General transfer comment: pt with difficulty maintaining TDWB status despite maximum attempts to educate in technique     Balance Overall balance assessment: Needs assistance Sitting-balance support: No upper extremity supported Sitting balance-Leahy Scale: Good     Standing balance support: Reliant on assistive device for balance Standing balance-Leahy Scale: Poor                             ADL either performed or  assessed with clinical judgement   ADL Overall ADL's : Needs assistance/impaired Eating/Feeding: Independent;Sitting   Grooming: Standing;Wash/dry hands;Wash/dry Sports administrator: Min guard;Ambulation;Comfort height toilet;Rolling walker (2 wheels);Grab bars Toilet Transfer Details (indicate cue type and reason): educated in use of 3 in 1 over toilet Toileting- Clothing Manipulation and Hygiene: Min guard       Functional mobility during ADLs: Min guard;Rolling walker (2 wheels) General ADL Comments: Patient educated on using book bag or garbage bag to transport items from one room to another  in order to safely use RW for stability.  Patient also educated on making sure to complete daily skin checks on feet before and after doning/doffing socks and shoes. Patient re-educated on using 3-1 over commode and also taking his time  functional mobility and not rushing to prevent falls and adhereing to TTWB through L LE.    Extremity/Trunk Assessment Upper Extremity Assessment Upper Extremity Assessment: Overall WFL for tasks assessed            Vision       Perception     Praxis      Cognition Arousal/Alertness: Awake/alert Behavior During Therapy: WFL for tasks assessed/performed Overall Cognitive Status: Impaired/Different from baseline Area of Impairment: Attention, Following commands, Safety/judgement, Awareness, Problem solving                   Current Attention  Level: Selective Memory: Decreased short-term memory, Decreased recall of precautions Following Commands: Follows one step commands consistently Safety/Judgement: Decreased awareness of safety, Decreased awareness of deficits Awareness: Emergent Problem Solving: Requires verbal cues General Comments: Pt needing repetition of instruction in compensatory strategies for maintaining TDWB.        Exercises      Shoulder Instructions       General Comments       Pertinent Vitals/ Pain       Pain Assessment Pain Location: L foot  Home Living                                          Prior Functioning/Environment              Frequency           Progress Toward Goals  OT Goals(current goals can now be found in the care plan section)        Plan      Co-evaluation                 AM-PAC OT "6 Clicks" Daily Activity     Outcome Measure                    End of Session Equipment Utilized During Treatment: Rolling walker (2 wheels)      Activity Tolerance Patient tolerated treatment well   Patient Left in chair;with call bell/phone within reach;with chair alarm set   Nurse Communication Patient requests pain meds        Time: 5462-7035 OT Time Calculation (min): 37 min  Charges: OT General Charges $OT Visit: 1 Visit OT Treatments $Self Care/Home Management : 23-37 mins    Hadley Pen OTR/L 04/10/2021, 10:22 AM

## 2021-04-10 NOTE — Progress Notes (Signed)
Nsg Discharge Note  Admit Date:  04/06/2021 Discharge date: 04/10/2021   Randall Coleman to be D/C'd Home wit [facility] per MD order.  AVS completed. Patient/caregiver able to verbalize understanding.  Discharge Medication: Allergies as of 04/10/2021   No Known Allergies      Medication List     STOP taking these medications    glipiZIDE 5 MG tablet Commonly known as: GLUCOTROL       TAKE these medications    Acetaminophen Extra Strength 500 MG tablet Generic drug: acetaminophen Take 2 tablets (1,000 mg total) by mouth every 8 (eight) hours.   apixaban 5 MG Tabs tablet Commonly known as: ELIQUIS Take 5 mg by mouth 2 (two) times daily.   ARIPiprazole 20 MG tablet Commonly known as: ABILIFY Take 0.5 tablets (10 mg total) by mouth daily.   benztropine 1 MG tablet Commonly known as: COGENTIN Take 1 mg by mouth daily.   bisacodyl 5 MG EC tablet Generic drug: bisacodyl Take 1 tablet (5 mg total) by mouth daily as needed for moderate constipation.   docusate sodium 100 MG capsule Commonly known as: COLACE Take 1 capsule (100 mg total) by mouth daily.   gabapentin 300 MG capsule Commonly known as: NEURONTIN Take 300 mg by mouth 3 (three) times daily.   Jardiance 10 MG Tabs tablet Generic drug: empagliflozin Take 1 tablet (10 mg total) by mouth daily before breakfast.   lisinopril 10 MG tablet Commonly known as: ZESTRIL Take 1 tablet (10 mg total) by mouth daily. What changed: when to take this   Magnesium Oxide 420 (252 Mg) MG Tabs Take 420 mg by mouth daily after breakfast.   metFORMIN 1000 MG tablet Commonly known as: GLUCOPHAGE Take 1,000 mg by mouth 2 (two) times daily after a meal.   multivitamin with minerals Tabs tablet Take 1 tablet by mouth every morning.   nicotine 21 mg/24hr patch Commonly known as: NICODERM CQ - dosed in mg/24 hours Place 1 patch (21 mg total) onto the skin daily.   omeprazole 20 MG capsule Commonly known as: PRILOSEC Take 20 mg  by mouth 2 (two) times daily.   Oxycodone HCl 10 MG Tabs Take 1 tablet (10 mg total) by mouth every 6 (six) hours as needed for up to 7 days for severe pain.   polyethylene glycol powder 17 GM/SCOOP powder Commonly known as: GLYCOLAX/MIRALAX Take 17 g by mouth daily as needed for mild constipation.   traZODone 100 MG tablet Commonly known as: DESYREL Take 1 tablet (100 mg total) by mouth at bedtime. What changed:  how much to take when to take this reasons to take this   Vitamin B 12 500 MCG Tabs Take 1,000 mcg by mouth every morning.               Durable Medical Equipment  (From admission, onward)           Start     Ordered   04/09/21 1035  For home use only DME Walker rolling  Once       Question Answer Comment  Walker: With 5 Inch Wheels   Patient needs a walker to treat with the following condition Weakness      04/09/21 1034   04/09/21 1034  For home use only DME 3 n 1  Once        04/09/21 1034              Discharge Care Instructions  (From admission, onward)  Start     Ordered   04/10/21 0000  Change dressing (specify)       Comments: Change dressing as needed   04/10/21 0825            Discharge Assessment: Vitals:   04/10/21 0341 04/10/21 0805  BP: 133/80 (!) 122/59  Pulse: 72 71  Resp: 16 17  Temp: 97.8 F (36.6 C) 97.6 F (36.4 C)  SpO2: 100% 98%   Skin clean, dry and intact without evidence of skin break down, no evidence of skin tears noted. IV catheter discontinued intact. Site without signs and symptoms of complications - no redness or edema noted at insertion site, patient denies c/o pain - only slight tenderness at site.  Dressing with slight pressure applied.  D/c Instructions-Education: Discharge instructions given to patient/family with verbalized understanding. D/c education completed with patient/family including follow up instructions, medication list, d/c activities limitations if indicated, with  other d/c instructions as indicated by MD - patient able to verbalize understanding, all questions fully answered. Patient instructed to return to ED, call 911, or call MD for any changes in condition.  Patient escorted via WC, and D/C home via [facility].  Atilano Ina, RN 04/10/2021 11:19 AM

## 2021-04-10 NOTE — Progress Notes (Signed)
Orthopedic Tech Progress Note Patient Details:  Randall Coleman Sep 18, 1950 [id number]  Ortho Devices Type of Ortho Device: Postop shoe/boot Ortho Device/Splint Location: LLE Ortho Device/Splint Interventions: Ordered   Post Interventions Patient Tolerated: Other (comment) Instructions Provided: Adjustment of device, Care of device, Poper ambulation with device  Randall Coleman 04/10/2021, 1:55 AM

## 2021-04-10 NOTE — Progress Notes (Signed)
Physical Therapy Treatment Patient Details Name: Randall Coleman MRN: [id number] DOB: 1950/08/05 Today's Date: 04/10/2021   History of Present Illness Pt is a 71 y.o. 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    PT Comments    Pt received in chair with post op shoe donned and agreeable to session focusing on WB precaution reinforcement with gait training. Pt with poor to fair adherence with TDWB precaution with max effort through multimodal cues and sequencing education. Pt demonstrated adherence to TDWB ~50% of time with most being at end of session after sequencing education and task practice. Pt continues to benefit from skilled PT services to progress toward functional mobility goals.    Recommendations for follow up therapy are one component of a multi-disciplinary discharge planning process, led by the attending physician.  Recommendations may be updated based on patient status, additional functional criteria and insurance authorization.  Follow Up Recommendations  Home health PT     Assistance Recommended at Discharge Intermittent Supervision/Assistance  Patient can return home with the following Assistance with cooking/housework;Assist for transportation;Help with stairs or ramp for entrance   Equipment Recommendations  None recommended by PT    Recommendations for Other Services       Precautions / Restrictions Precautions Precautions: Fall Required Braces or Orthoses: Other Brace Other Brace: post op shoe Restrictions Weight Bearing Restrictions: Yes LLE Weight Bearing: Touchdown weight bearing Other Position/Activity Restrictions: in post-op shoe     Mobility  Bed Mobility       General bed mobility comments: pt OOB in chair on arrival    Transfers Overall transfer level: Needs assistance Equipment used: Rolling walker (2  wheels) Transfers: Sit to/from Stand Sit to Stand: Min guard     General transfer comment: min guard for safety, pt with difficulty maintaining TDWB status despite maximum attempts to educate in technique    Ambulation/Gait Ambulation/Gait assistance: Min guard, Min assist Gait Distance (Feet): 15 Feet Assistive device: Rolling walker (2 wheels) Gait Pattern/deviations: Step-to pattern       General Gait Details: ambulation in room, max cues and effort for pt to maintain TDWB, mulitomodal cues for sequencing with poor to fair adherance   Stairs             Wheelchair Mobility    Modified Rankin (Stroke Patients Only)       Balance Overall balance assessment: Needs assistance Sitting-balance support: No upper extremity supported Sitting balance-Leahy Scale: Good     Standing balance support: Reliant on assistive device for balance Standing balance-Leahy Scale: Poor Standing balance comment: reliance on BUE support          Cognition Arousal/Alertness: Awake/alert Behavior During Therapy: WFL for tasks assessed/performed Overall Cognitive Status: Impaired/Different from baseline Area of Impairment: Attention, Following commands, Safety/judgement, Awareness, Problem solving     Current Attention Level: Selective Memory: Decreased short-term memory, Decreased recall of precautions Following Commands: Follows one step commands consistently Safety/Judgement: Decreased awareness of safety, Decreased awareness of deficits Awareness: Emergent Problem Solving: Requires verbal cues General Comments: Pt needing repetition of instruction in compensatory strategies for maintaining TDWB.        Exercises      General Comments        Pertinent Vitals/Pain Pain Assessment Pain Assessment: Faces Faces Pain Scale: Hurts a little bit Pain Location: L foot Pain Descriptors / Indicators: Discomfort, Grimacing, Guarding Pain Intervention(s):  Limited activity  within patient's tolerance, Monitored during session, Premedicated before session    Home Living                          Prior Function            PT Goals (current goals can now be found in the care plan section) Acute Rehab PT Goals PT Goal Formulation: With patient Time For Goal Achievement: 04/23/21 Potential to Achieve Goals: Good    Frequency    Min 3X/week      PT Plan      Co-evaluation              AM-PAC PT "6 Clicks" Mobility   Outcome Measure  Help needed turning from your back to your side while in a flat bed without using bedrails?: None Help needed moving from lying on your back to sitting on the side of a flat bed without using bedrails?: None Help needed moving to and from a bed to a chair (including a wheelchair)?: A Little Help needed standing up from a chair using your arms (e.g., wheelchair or bedside chair)?: A Little Help needed to walk in hospital room?: A Little Help needed climbing 3-5 steps with a railing? : A Little 6 Click Score: 20    End of Session Equipment Utilized During Treatment: Gait belt Activity Tolerance: Patient tolerated treatment well;Patient limited by pain Patient left: in chair;with call bell/phone within reach;with chair alarm set Nurse Communication: Mobility status PT Visit Diagnosis: Other abnormalities of gait and mobility (R26.89);Pain;Muscle weakness (generalized) (M62.81)     Time: 1005-1020 PT Time Calculation (min) (ACUTE ONLY): 15 min  Charges:  $Gait Training: 8-22 mins                     Kielyn Kardell R. PTA Acute Rehabilitation Services Office: [place] 04/10/2021, 10:31 AM

## 2021-04-11 LAB — CULTURE, BLOOD (ROUTINE X 2)
Culture: NO GROWTH
Culture: NO GROWTH
Special Requests: ADEQUATE

## 2021-04-18 ENCOUNTER — Emergency Department (HOSPITAL_COMMUNITY): Payer: No Typology Code available for payment source

## 2021-04-18 ENCOUNTER — Other Ambulatory Visit: Payer: Self-pay

## 2021-04-18 ENCOUNTER — Emergency Department (HOSPITAL_COMMUNITY)
Admission: EM | Admit: 2021-04-18 | Discharge: 2021-04-18 | Disposition: A | Discharge status: Home / Self Care | Payer: No Typology Code available for payment source | Attending: Emergency Medicine | Admitting: Emergency Medicine

## 2021-04-18 ENCOUNTER — Encounter (HOSPITAL_COMMUNITY): Payer: Self-pay

## 2021-04-18 DIAGNOSIS — Z7901 Long term (current) use of anticoagulants: Secondary | ICD-10-CM | POA: Diagnosis not present

## 2021-04-18 DIAGNOSIS — R0602 Shortness of breath: Secondary | ICD-10-CM | POA: Diagnosis present

## 2021-04-18 DIAGNOSIS — E119 Type 2 diabetes mellitus without complications: Secondary | ICD-10-CM | POA: Insufficient documentation

## 2021-04-18 DIAGNOSIS — I4892 Unspecified atrial flutter: Secondary | ICD-10-CM | POA: Diagnosis not present

## 2021-04-18 DIAGNOSIS — I10 Essential (primary) hypertension: Secondary | ICD-10-CM | POA: Diagnosis not present

## 2021-04-18 LAB — CBC
HCT: 38.2 % — ABNORMAL LOW (ref 39.0–52.0)
Hemoglobin: 13.1 g/dL (ref 13.0–17.0)
MCH: 29 pg (ref 26.0–34.0)
MCHC: 34.3 g/dL (ref 30.0–36.0)
MCV: 84.5 fL (ref 80.0–100.0)
Platelets: 359 10*3/uL (ref 150–400)
RBC: 4.52 MIL/uL (ref 4.22–5.81)
RDW: 13.2 % (ref 11.5–15.5)
WBC: 9.3 10*3/uL (ref 4.0–10.5)
nRBC: 0 % (ref 0.0–0.2)

## 2021-04-18 LAB — BASIC METABOLIC PANEL
Anion gap: 9 (ref 5–15)
BUN: 13 mg/dL (ref 8–23)
CO2: 25 mmol/L (ref 22–32)
Calcium: 8.3 mg/dL — ABNORMAL LOW (ref 8.9–10.3)
Chloride: 97 mmol/L — ABNORMAL LOW (ref 98–111)
Creatinine, Ser: 0.79 mg/dL (ref 0.61–1.24)
GFR, Estimated: >60 mL/min (ref >60)
Glucose, Bld: 402 mg/dL — ABNORMAL HIGH (ref 70–99)
Potassium: 3.4 mmol/L — ABNORMAL LOW (ref 3.5–5.1)
Sodium: 131 mmol/L — ABNORMAL LOW (ref 135–145)

## 2021-04-18 LAB — MAGNESIUM: Magnesium: 1.4 mg/dL — ABNORMAL LOW (ref 1.7–2.4)

## 2021-04-18 MED ORDER — OXYCODONE-ACETAMINOPHEN 5-325 MG PO TABS
1.0000 | ORAL_TABLET | Freq: Once | ORAL | Status: AC
  Administered 2021-04-18: 1 via ORAL
  Filled 2021-04-18: qty 1

## 2021-04-18 MED ORDER — MAGNESIUM SULFATE IN D5W 1-5 GM/100ML-% IV SOLN
1.0000 g | Freq: Once | INTRAVENOUS | Status: AC
  Administered 2021-04-18: 1 g via INTRAVENOUS
  Filled 2021-04-18: qty 100

## 2021-04-18 MED ORDER — DILTIAZEM LOAD VIA INFUSION
15.0000 mg | Freq: Once | INTRAVENOUS | Status: AC
  Administered 2021-04-18: 15 mg via INTRAVENOUS
  Filled 2021-04-18: qty 15

## 2021-04-18 MED ORDER — POTASSIUM CHLORIDE CRYS ER 20 MEQ PO TBCR
40.0000 meq | EXTENDED_RELEASE_TABLET | Freq: Once | ORAL | Status: AC
Start: 2021-04-18 — End: 2021-04-18
  Administered 2021-04-18: 40 meq via ORAL
  Filled 2021-04-18: qty 2

## 2021-04-18 MED ORDER — DILTIAZEM HCL-DEXTROSE 125-5 MG/125ML-% IV SOLN (PREMIX)
5.0000 mg/h | INTRAVENOUS | Status: DC
  Administered 2021-04-18: 5 mg/h via INTRAVENOUS
  Filled 2021-04-18: qty 125

## 2021-04-18 MED ORDER — SODIUM CHLORIDE 0.9 % IV BOLUS
500.0000 mL | Freq: Once | INTRAVENOUS | Status: AC
  Administered 2021-04-18: 500 mL via INTRAVENOUS

## 2021-04-18 NOTE — ED Notes (Signed)
Oral hydration and sandwich provided.

## 2021-04-18 NOTE — ED Triage Notes (Signed)
Presents to ER via EMS with c/o dizziness, shortness of breath for 1-2 days. EMS gave 20 mg of Cardizem for a-fib w/RVR. No change reported in rhythm or rate.

## 2021-04-18 NOTE — Discharge Instructions (Signed)
You were seen in the emergency room today with atrial flutter.  This is an abnormal heart rhythm that I think was explaining many of your symptoms.  I have referred you to the A-fib clinic.  You should continue taking your home medications including your anticoagulation.  Please follow with your primary care doctor as well as the cardiology team.  They should be calling you for an appointment in the next several days.  Return with any new or suddenly worsening symptoms.

## 2021-04-18 NOTE — ED Provider Notes (Signed)
Emergency Department Provider Note   I have reviewed the triage vital signs and the nursing notes.   HISTORY  Chief Complaint Tachycardia, Dizziness, and Shortness of Breath   HPI Randall Coleman is a 71 y.o. male with past history of diabetes, hypertension, DVT on Eliquis presents with heart palpitations starting this morning.  He describes fatigue and shortness of breath which ultimately prompted him to call EMS.  He is not having any fever.  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  The procedure was done on 2/8.  He has had no significant side effects from that procedure.  He has been compliant with his anticoagulation.  EMS arrived and found him to be an atrial flutter with a rate into the 160s.  They tried 20 mg of diltiazem prior to arrival with no improvement.     Past Medical History:  Diagnosis Date   Diabetes mellitus without complication ([place])    DVT (deep venous thrombosis) ([place])    Hypertension     Review of Systems  Constitutional: No fever/chills Eyes: No visual changes. ENT: No sore throat. Cardiovascular: Positive chest heaviness and palpitations.  Respiratory: Positive shortness of breath. Gastrointestinal: No abdominal pain.  No nausea, no vomiting.  No diarrhea.  No constipation. Genitourinary: Negative for dysuria. Musculoskeletal: Negative for back pain. Skin: Negative for rash. Neurological: Negative for headaches, focal weakness or numbness.   ____________________________________________   PHYSICAL EXAM:  VITAL SIGNS: ED Triage Vitals  Enc Vitals Group     BP 04/18/21 1458 (!) 104/91     Pulse Rate 04/18/21 1458 (!) 159     Resp 04/18/21 1458 20     Temp 04/18/21 1458 98 F (36.7 C)     Temp src --      SpO2 04/18/21 1457 100 %     Weight 04/18/21 1502 185 lb (83.9 kg)     Height 04/18/21 1502 6\' 2"  (1.88 m)    Constitutional: Alert and oriented. Well appearing and in no acute distress. Eyes:  Conjunctivae are normal.  Head: Atraumatic. Nose: No congestion/rhinnorhea. Mouth/Throat: Mucous membranes are moist.  Neck: No stridor.  Cardiovascular: Tachycardia. Good peripheral circulation. Grossly normal heart sounds.   Respiratory: Normal respiratory effort.  No retractions. Lungs CTAB. Neurologic:  Normal speech and language. No gross focal neurologic deficits are appreciated.  Skin:  Skin is warm, dry and intact. No rash noted.  ____________________________________________   LABS (all labs ordered are listed, but only abnormal results are displayed)  Labs Reviewed  BASIC METABOLIC PANEL - Abnormal; Notable for the following components:      Result Value   Sodium 131 (*)    Potassium 3.4 (*)    Chloride 97 (*)    Glucose, Bld 402 (*)    Calcium 8.3 (*)    All other components within normal limits  CBC - Abnormal; Notable for the following components:   HCT 38.2 (*)    All other components within normal limits  MAGNESIUM - Abnormal; Notable for the following components:   Magnesium 1.4 (*)    All other components within normal limits   ____________________________________________  EKG   EKG Interpretation  Date/Time:  Saturday April 18 2021 15:01:00 EST Ventricular Rate:  157 PR Interval:    QRS Duration: 136 QT Interval:  299 QTC Calculation: 484 R Axis:   10 Text Interpretation: Atrial flutter with RVR Nonspecific intraventricular conduction delay Confirmed by Nanda Quinton [phone]) on 04/18/2021 3:43:27 PM  EKG Interpretation  Date/Time:  Saturday April 18 2021 16:30:20 EST Ventricular Rate:  79 PR Interval:  179 QRS Duration: 79 QT Interval:  376 QTC Calculation: 431 R Axis:   77 Text Interpretation: Ectopic atrial rhythm Paired ventricular premature complexes Left ventricular hypertrophy Anterior infarct, old Confirmed by Nanda Quinton [phone]) on 04/18/2021 4:59:35 PM         ____________________________________________  RADIOLOGY  DG Chest 2 View  Result Date: 04/18/2021 CLINICAL DATA:  Shortness of breath EXAM: CHEST - 2 VIEW COMPARISON:  Chest x-ray 12/09/2018 FINDINGS: Heart size and mediastinal contours are within normal limits. No suspicious pulmonary opacities identified. No pleural effusion or pneumothorax visualized. No acute osseous abnormality appreciated. IMPRESSION: No acute intrathoracic process identified. Electronically Signed   By: Ofilia Neas M.D.   On: 04/18/2021 16:22    ____________________________________________   PROCEDURES  Procedure(s) performed:   Procedures  CRITICAL CARE Performed by: Margette Fast Total critical care time: 35 minutes Critical care time was exclusive of separately billable procedures and treating other patients. Critical care was necessary to treat or prevent imminent or life-threatening deterioration. Critical care was time spent personally by me on the following activities: development of treatment plan with patient and/or surrogate as well as nursing, discussions with consultants, evaluation of patient's response to treatment, examination of patient, obtaining history from patient or surrogate, ordering and performing treatments and interventions, ordering and review of laboratory studies, ordering and review of radiographic studies, pulse oximetry and re-evaluation of patient's condition.  Nanda Quinton, MD Emergency Medicine  ____________________________________________   INITIAL IMPRESSION / ASSESSMENT AND PLAN / ED COURSE  Pertinent labs & imaging results that were available during my care of the patient were reviewed by me and considered in my medical decision making (see chart for details).   This patient is Presenting for Evaluation of CP, which does require a range of treatment options, and is a complaint that involves a high risk of morbidity and mortality.  The Differential Diagnoses  includes all life-threatening causes for chest pain. This includes but is not exclusive to acute coronary syndrome, aortic dissection, pulmonary embolism, cardiac tamponade, community-acquired pneumonia, pericarditis, musculoskeletal chest wall pain, etc.   Critical Interventions- Diltiazem ggt   Medications  diltiazem (CARDIZEM) 1 mg/mL load via infusion 15 mg (15 mg Intravenous Bolus from Bag 04/18/21 1529)  sodium chloride 0.9 % bolus 500 mL (0 mLs Intravenous Stopped 04/18/21 1636)  magnesium sulfate IVPB 1 g 100 mL (0 g Intravenous Stopped 04/18/21 1724)  potassium chloride SA (KLOR-CON M) CR tablet 40 mEq (40 mEq Oral Given 04/18/21 1701)  oxyCODONE-acetaminophen (PERCOCET/ROXICET) 5-325 MG per tablet 1 tablet (1 tablet Oral Given 04/18/21 1919)    Reassessment after intervention:  patient converted to NSR.   I did obtain Additional Historical Information from EMS with care en route described above.  I decided to review pertinent External Data, and in summary patient discharged on 2/10 after admit for osteo and toe amputation on 2/8 with Dr. Sharol Given.    Clinical Laboratory Tests Ordered, included Mg slightly low with potassium of 3.4.   Radiologic Tests Ordered, included CXR. I independently interpreted the images and agree with radiology interpretation.   Cardiac Monitor Tracing which shows A flutter with RVR.  Social Determinants of Health Risk  patient has a smoking history.    Medical Decision Making: Summary:  Patient arrives to the emergency department with chest heaviness, shortness of breath, palpitations.  Suspect that most of the symptoms are related  to his a flutter with RVR.  Plan to screen for electrolyte disturbance.  We will start IV fluid bolus along with diltiazem infusion to attempt rate control.  He was off of his anticoagulation briefly earlier this month for his surgery but has since been back on and compliant.  After labs, will discuss with cardiology to determine if  patient is a cardioversion candidate in this setting.   This patients CHA2DS2-VASc Score and unadjusted Ischemic Stroke Rate (% per year) is equal to 3.2 % stroke rate/year from a score of 3  Above score calculated as 1 point each if present [CHF, HTN, DM, Vascular=MI/PAD/Aortic Plaque, Age if 65-74, or Male] Above score calculated as 2 points each if present [Age > 75, or Stroke/TIA/TE]   04:30 PM  While on diltiazem infusion patient has spontaneously converted to sinus rhythm.  Blood pressure improved.  Review of blood work shows the magnesium and potassium are low.  Plan to supplement and will discontinue the infusion after that and monitor for recurrent atrial flutter.   Reevaluation with update and discussion with patient. Observed in the ED for several hours after conversion to sinus rhythm. He is no longer requiring Diltiazem. Feeling well. Tolerating PO. Plan for discharge home. Have placed referral in our system for a-fib clinic.   Disposition: discharge  ____________________________________________  FINAL CLINICAL IMPRESSION(S) / ED DIAGNOSES  Final diagnoses:  Atrial flutter with rapid ventricular response ([place])     Note:  This document was prepared using Dragon voice recognition software and may include unintentional dictation errors.  Nanda Quinton, MD, [facility] Emergency Medicine    Daryan Cagley, [facility], MD 04/23/21 [phone]

## 2021-04-18 NOTE — ED Notes (Signed)
Patient transported to X-ray 

## 2021-04-21 ENCOUNTER — Telehealth (HOSPITAL_COMMUNITY): Payer: Self-pay

## 2021-04-21 NOTE — Telephone Encounter (Signed)
Contacted patient regarding ED follow up appt. Scheduled appointment for him on 3/2 @ 9:30am to see [facility]. Patient given directions and he was told we will mail him appointment reminder prior to next week. Consulted with patient and he verbalized understanding.

## 2021-04-30 ENCOUNTER — Inpatient Hospital Stay (HOSPITAL_COMMUNITY)
Admission: RE | Admit: 2021-04-30 | Discharge: 2021-04-30 | Disposition: A | Discharge status: Home / Self Care | Payer: No Typology Code available for payment source | Source: Ambulatory Visit | Attending: Physician Assistant | Admitting: Physician Assistant

## 2021-04-30 NOTE — Progress Notes (Incomplete)
? ? ?Primary Care Physician: Pcp, No ?Primary Cardiologist: none ?Primary Electrophysiologist: none ?Referring Physician: [facility] ? ? ?Randall Coleman is a 71 y.o. male with a history of DM, HTN, DVT, atrial fibrillation who presents for consultation in the [facility].  The patient was initially diagnosed with atrial fibrillation 04/18/21 after presenting to the ED with symptoms of palpitations and SOB. ECG showed atrial flutter with RVR. Patient is on *** for a CHADS2VASC score of ***. He was given a fluid bolus and started on diltiazem IV which converted him to SR. His K+ and magnesium were repleated. *** ? ?Today, he denies symptoms of ***palpitations, chest pain, shortness of breath, orthopnea, PND, lower extremity edema, dizziness, presyncope, syncope, snoring, daytime somnolence, bleeding, or neurologic sequela. The patient is tolerating medications without difficulties and is otherwise without complaint today.  ? ? ?Atrial Fibrillation Risk Factors: ? ?he {Action; does/does not:19097} have symptoms or diagnosis of sleep apnea. ?he {ACTION; IS/IS [id number]} compliant with CPAP therapy. ?he {Action; does/does not:19097} have a history of rheumatic fever. ?he {Action; does/does not:19097} have a history of alcohol use. ?The patient {Action; does/does not:19097} have a history of early familial atrial fibrillation or other arrhythmias. ? ?he has a BMI of There is no height or weight on file to calculate BMI.[name] ?There were no vitals filed for this visit. ? ?No family history on file. ? ? ?Atrial Fibrillation Management history: ? ?Previous antiarrhythmic drugs: none ?Previous cardioversions: none ?Previous ablations: none ?CHADS2VASC score: 3 ?Anticoagulation history: Eliquis  ? ? ?Past Medical History:  ?Diagnosis Date  ? Diabetes mellitus without complication ([place])   ? DVT (deep venous thrombosis) ([place])   ? Hypertension   ? ?Past Surgical History:  ?Procedure Laterality Date  ?  AMPUTATION Left 07/30/2020  ? Procedure: AMPUTATION LEFT GREAT TOE;  Surgeon: [name], MD;  Location: [place];  Service: Orthopedics;  Laterality: Left;  ? AMPUTATION Left 04/08/2021  ? Procedure: AMPUTATION LEFT 2ND TOE;  Surgeon: [name], MD;  Location: [place];  Service: Orthopedics;  Laterality: Left;  ? HERNIA REPAIR    ? HERNIA REPAIR    ? IR CHEST FLUORO  12/13/2018  ? ? ?Current Outpatient Medications  ?Medication Sig Dispense Refill  ? acetaminophen (TYLENOL) 500 MG tablet Take 2 tablets (1,000 mg total) by mouth every 8 (eight) hours. 30 tablet 0  ? apixaban (ELIQUIS) 5 MG TABS tablet Take 5 mg by mouth 2 (two) times daily.    ? ARIPiprazole (ABILIFY) 20 MG tablet Take 0.5 tablets (10 mg total) by mouth daily. (Patient not taking: Reported on 04/06/2021) 10 tablet 0  ? benztropine (COGENTIN) 1 MG tablet Take 1 mg by mouth daily. (Patient not taking: Reported on 04/06/2021)    ? bisacodyl (DULCOLAX) 5 MG EC tablet Take 1 tablet (5 mg total) by mouth daily as needed for moderate constipation. 30 tablet 0  ? Cyanocobalamin (VITAMIN B 12) 500 MCG TABS Take 1,000 mcg by mouth every morning.    ? docusate sodium (COLACE) 100 MG capsule Take 1 capsule (100 mg total) by mouth daily. 10 capsule 0  ? empagliflozin (JARDIANCE) 10 MG TABS tablet Take 1 tablet (10 mg total) by mouth daily before breakfast. 30 tablet 0  ? gabapentin (NEURONTIN) 300 MG capsule Take 300 mg by mouth 3 (three) times daily. (Patient not taking: Reported on 04/06/2021)    ? lisinopril (ZESTRIL) 10 MG tablet Take 1 tablet (10 mg total) by mouth  daily. (Patient taking differently: Take 10 mg by mouth every morning.) 30 tablet 0  ? Magnesium Oxide 420 (252 Mg) MG TABS Take 420 mg by mouth daily after breakfast.    ? metFORMIN (GLUCOPHAGE) 1000 MG tablet Take 1,000 mg by mouth 2 (two) times daily after a meal.    ? Multiple Vitamin (MULTIVITAMIN WITH MINERALS) TABS tablet Take 1 tablet by mouth every morning.    ? nicotine (NICODERM CQ - DOSED IN  MG/24 HOURS) 21 mg/24hr patch Place 1 patch (21 mg total) onto the skin daily. (Patient not taking: Reported on 04/06/2021) 28 patch 0  ? omeprazole (PRILOSEC) 20 MG capsule Take 20 mg by mouth 2 (two) times daily.    ? polyethylene glycol powder (GLYCOLAX/MIRALAX) 17 GM/SCOOP powder Take 17 g by mouth daily as needed for mild constipation. 238 g 0  ? traZODone (DESYREL) 100 MG tablet Take 1 tablet (100 mg total) by mouth at bedtime. (Patient taking differently: Take 200 mg by mouth at bedtime as needed for sleep.) 10 tablet 0  ? ?No current facility-administered medications for this visit.  ? ? ?No Known Allergies ? ?Social History  ? ?Socioeconomic History  ? Marital status: Single  ?  Spouse name: Not on file  ? Number of children: Not on file  ? Years of education: Not on file  ? Highest education level: Not on file  ?Occupational History  ? Occupation: retired  ?Tobacco Use  ? Smoking status: Every Day  ?  Packs/day: 1.00  ?  Years: 54.00  ?  Pack years: 54.00  ?  Types: Cigarettes  ? Smokeless tobacco: Never  ?Substance and Sexual Activity  ? Alcohol use: Not Currently  ?  Comment: h/o heavy use, quit about 10 years ago  ? Drug use: Never  ?  Comment: used when he was younger  ? Sexual activity: Not Currently  ?Other Topics Concern  ? Not on file  ?Social History Narrative  ? Not on file  ? ?Social Determinants of Health  ? ?Financial Resource Strain: Not on file  ?Food Insecurity: Not on file  ?Transportation Needs: Not on file  ?Physical Activity: Not on file  ?Stress: Not on file  ?Social Connections: Not on file  ?Intimate Partner Violence: Not on file  ? ? ? ?ROS- All systems are reviewed and negative except as per the HPI above. ? ?Physical Exam: ?There were no vitals filed for this visit. ? ?GEN- The patient is a well appearing ***{Desc; male/male:11659}, alert and oriented x 3 today.   ?Head- normocephalic, atraumatic ?Eyes-  Sclera clear, conjunctiva pink ?Ears- hearing intact ?Oropharynx-  clear ?Neck- supple  ?Lungs- Clear to ausculation bilaterally, normal work of breathing ?Heart- ***Regular rate and rhythm, no murmurs, rubs or gallops  ?GI- soft, NT, ND, + BS ?Extremities- no clubbing, cyanosis, or edema ?MS- no significant deformity or atrophy ?Skin- no rash or lesion ?Psych- euthymic mood, full affect ?Neuro- strength and sensation are intact ? ?Wt Readings from Last 3 Encounters:  ?04/18/21 83.9 kg  ?04/08/21 82.1 kg  ?09/29/20 97.1 kg  ? ? ?EKG today demonstrates  ?*** ? ?Echo 12/10/18 demonstrated  ? 1. Left ventricular ejection fraction, by visual estimation, is 60 to  ?65%. The left ventricle has normal function. Normal left ventricular size.  ?There is severely increased left ventricular hypertrophy.  ? 2. Global right ventricle has normal systolic function.The right  ?ventricular size is mildly enlarged. No increase in right ventricular wall  ?thickness.  ?  3. 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  ? ?Epic records are reviewed at length today ? ?CHA2DS2-VASc Score =    ?The patient's score is based upon: ?  {Click here to calculate score.  REFRESH note before signing. :1}   ? ? ?ASSESSMENT AND PLAN: ?{Select the correct AFib Diagnosis                 :[id number]} ? ?***ablation? Rate control ?Continue Eliquis 5 mg BID ? ?3. Obesity ?There is no height or weight on file to calculate BMI. ?Lifestyle modification was discussed at length including regular exercise and weight reduction. ?*** ? ?4. Snoring***Obstructive sleep apnea ?The importance of adequate treatment of sleep apnea was discussed today in order to improve our ability to maintain sinus rhythm long term. ?*** ? ?5. HTN ?Stable, no changes today. ?*** ? ? ?Follow up *** ? ? ?Ricky Zeddie Njie PA-C ?[facility] ?[facility] ?[address] ?[place], [place] [address] ?[phone] ?04/30/2021 ?8:50 AM ? ?

## 2021-05-08 ENCOUNTER — Inpatient Hospital Stay: Payer: No Typology Code available for payment source | Admitting: Family

## 2021-05-15 ENCOUNTER — Encounter: Payer: Medicare Other | Admitting: Family

## 2021-05-18 ENCOUNTER — Telehealth: Payer: Self-pay

## 2021-05-18 NOTE — Telephone Encounter (Signed)
Elicia Lamp, LPN with [facility] called wanting know if patient's sutures could be removed.   ?Talked with Dr. Jess Barters assistant Autumn and per Autumn, sutures could be removed and patient would need a F/U appointment. ? ?Shauna Hugh of message above and she voiced that she understands. ?

## 2021-08-07 ENCOUNTER — Emergency Department (HOSPITAL_COMMUNITY): Payer: No Typology Code available for payment source

## 2021-08-07 ENCOUNTER — Emergency Department (HOSPITAL_COMMUNITY)
Admission: EM | Admit: 2021-08-07 | Discharge: 2021-08-07 | Disposition: A | Discharge status: Home / Self Care | Payer: No Typology Code available for payment source | Attending: Emergency Medicine | Admitting: Emergency Medicine

## 2021-08-07 ENCOUNTER — Encounter (HOSPITAL_COMMUNITY): Payer: Self-pay

## 2021-08-07 DIAGNOSIS — I10 Essential (primary) hypertension: Secondary | ICD-10-CM | POA: Diagnosis not present

## 2021-08-07 DIAGNOSIS — E119 Type 2 diabetes mellitus without complications: Secondary | ICD-10-CM | POA: Diagnosis not present

## 2021-08-07 DIAGNOSIS — M868X7 Other osteomyelitis, ankle and foot: Secondary | ICD-10-CM | POA: Diagnosis not present

## 2021-08-07 DIAGNOSIS — Z7901 Long term (current) use of anticoagulants: Secondary | ICD-10-CM | POA: Insufficient documentation

## 2021-08-07 DIAGNOSIS — F17210 Nicotine dependence, cigarettes, uncomplicated: Secondary | ICD-10-CM | POA: Insufficient documentation

## 2021-08-07 DIAGNOSIS — Z79899 Other long term (current) drug therapy: Secondary | ICD-10-CM | POA: Insufficient documentation

## 2021-08-07 DIAGNOSIS — Z7984 Long term (current) use of oral hypoglycemic drugs: Secondary | ICD-10-CM | POA: Insufficient documentation

## 2021-08-07 DIAGNOSIS — M79671 Pain in right foot: Secondary | ICD-10-CM | POA: Diagnosis present

## 2021-08-07 DIAGNOSIS — M869 Osteomyelitis, unspecified: Secondary | ICD-10-CM

## 2021-08-07 LAB — COMPREHENSIVE METABOLIC PANEL
ALT: 15 U/L (ref 0–44)
AST: 14 U/L — ABNORMAL LOW (ref 15–41)
Albumin: 4.2 g/dL (ref 3.5–5.0)
Alkaline Phosphatase: 73 U/L (ref 38–126)
Anion gap: 8 (ref 5–15)
BUN: 11 mg/dL (ref 8–23)
CO2: 27 mmol/L (ref 22–32)
Calcium: 9.9 mg/dL (ref 8.9–10.3)
Chloride: 99 mmol/L (ref 98–111)
Creatinine, Ser: 0.74 mg/dL (ref 0.61–1.24)
GFR, Estimated: >60 mL/min (ref >60)
Glucose, Bld: 267 mg/dL — ABNORMAL HIGH (ref 70–99)
Potassium: 5 mmol/L (ref 3.5–5.1)
Sodium: 134 mmol/L — ABNORMAL LOW (ref 135–145)
Total Bilirubin: 0.7 mg/dL (ref 0.3–1.2)
Total Protein: 7.7 g/dL (ref 6.5–8.1)

## 2021-08-07 LAB — CBC WITH DIFFERENTIAL/PLATELET
Abs Immature Granulocytes: 0.08 10*3/uL — ABNORMAL HIGH (ref 0.00–0.07)
Basophils Absolute: 0 10*3/uL (ref 0.0–0.1)
Basophils Relative: 0 %
Eosinophils Absolute: 0.1 10*3/uL (ref 0.0–0.5)
Eosinophils Relative: 2 %
HCT: 43.3 % (ref 39.0–52.0)
Hemoglobin: 15 g/dL (ref 13.0–17.0)
Immature Granulocytes: 1 %
Lymphocytes Relative: 13 %
Lymphs Abs: 1 10*3/uL (ref 0.7–4.0)
MCH: 28.9 pg (ref 26.0–34.0)
MCHC: 34.6 g/dL (ref 30.0–36.0)
MCV: 83.4 fL (ref 80.0–100.0)
Monocytes Absolute: 0.5 10*3/uL (ref 0.1–1.0)
Monocytes Relative: 7 %
Neutro Abs: 6 10*3/uL (ref 1.7–7.7)
Neutrophils Relative %: 77 %
Platelets: 238 10*3/uL (ref 150–400)
RBC: 5.19 MIL/uL (ref 4.22–5.81)
RDW: 12.5 % (ref 11.5–15.5)
WBC: 7.8 10*3/uL (ref 4.0–10.5)
nRBC: 0 % (ref 0.0–0.2)

## 2021-08-07 MED ORDER — OXYCODONE HCL 5 MG PO TABS: 5.0000 mg | ORAL_TABLET | ORAL | 0 refills | Status: DC | PRN

## 2021-08-07 MED ORDER — KETOROLAC TROMETHAMINE 15 MG/ML IJ SOLN
15.0000 mg | Freq: Once | INTRAMUSCULAR | Status: AC
  Administered 2021-08-07: 15 mg via INTRAVENOUS
  Filled 2021-08-07: qty 1

## 2021-08-07 MED ORDER — OXYCODONE-ACETAMINOPHEN 5-325 MG PO TABS
1.0000 | ORAL_TABLET | Freq: Four times a day (QID) | ORAL | 0 refills | Status: DC | PRN
Start: 2021-08-07 — End: 2021-08-07

## 2021-08-07 NOTE — ED Provider Notes (Signed)
[facility] DEPT Provider Note   CSN: [id number] Arrival date & time: 08/07/21  1230     History  Chief Complaint  Patient presents with   Toe Injury    Randall Coleman is a 71 y.o. male.  HPI  71 year old male presents emergency department with complaints of left foot pain.  Patient states that pain began Monday with associated swelling.  He has a history of 2 prior surgeries on affected foot with amputation of first and second digit on 07/30/2020 and 04/08/2021 respectively secondary to osteoarthritis.  Patient denies known incident of pain.  He is noted serosanguineous drainage from affected digit since symptom onset.  Pain is now rating into his forefoot and up his ankle.  Patient is still able to ambulate without difficulty.  He states he has an odd sensation in his foot tendinitis if he had stepped on something.  He is still an active smoker.  Denies fever, weakness, sensory deficits from baseline, fall, trauma to affected extremity.   Past Medical History:  Diagnosis Date   Diabetes mellitus without complication ([place])    DVT (deep venous thrombosis) ([place])    Hypertension    Past Surgical History:  Procedure Laterality Date   AMPUTATION Left 07/30/2020   Procedure: AMPUTATION LEFT GREAT TOE;  Surgeon: [name], MD;  Location: [facility];  Service: Orthopedics;  Laterality: Left;   AMPUTATION Left 04/08/2021   Procedure: AMPUTATION LEFT 2ND TOE;  Surgeon: [name], MD;  Location: [facility];  Service: Orthopedics;  Laterality: Left;   HERNIA REPAIR     HERNIA REPAIR     IR CHEST FLUORO  12/13/2018    Home Medications Prior to Admission medications   Medication Sig Start Date End Date Taking? Authorizing Provider  oxyCODONE (ROXICODONE) 5 MG immediate release tablet Take 1 tablet (5 mg total) by mouth every 4 (four) hours as needed for up to 3 days for severe pain. 08/07/21 08/10/21 Yes Dion Saucier A, PA  acetaminophen (TYLENOL) 500 MG tablet Take  2 tablets (1,000 mg total) by mouth every 8 (eight) hours. 04/10/21   Lajean Manes, MD  apixaban (ELIQUIS) 5 MG TABS tablet Take 5 mg by mouth 2 (two) times daily.    [provider]  ARIPiprazole (ABILIFY) 20 MG tablet Take 0.5 tablets (10 mg total) by mouth daily. Patient not taking: Reported on 04/06/2021 12/15/18   Dhungel, [name], MD  benztropine (COGENTIN) 1 MG tablet Take 1 mg by mouth daily. Patient not taking: Reported on 04/06/2021    [provider]  bisacodyl (DULCOLAX) 5 MG EC tablet Take 1 tablet (5 mg total) by mouth daily as needed for moderate constipation. 04/10/21   Lajean Manes, MD  Cyanocobalamin (VITAMIN B 12) 500 MCG TABS Take 1,000 mcg by mouth every morning.    [provider]  docusate sodium (COLACE) 100 MG capsule Take 1 capsule (100 mg total) by mouth daily. 04/10/21   Lajean Manes, MD  empagliflozin (JARDIANCE) 10 MG TABS tablet Take 1 tablet (10 mg total) by mouth daily before breakfast. 04/10/21   Lajean Manes, MD  gabapentin (NEURONTIN) 300 MG capsule Take 300 mg by mouth 3 (three) times daily. Patient not taking: Reported on 04/06/2021 03/15/17   [provider]  lisinopril (ZESTRIL) 10 MG tablet Take 1 tablet (10 mg total) by mouth daily. Patient taking differently: Take 10 mg by mouth every morning. 12/15/18   Dhungel, [name], MD  Magnesium Oxide 420 (252 Mg) MG TABS  Take 420 mg by mouth daily after breakfast.    [provider]  metFORMIN (GLUCOPHAGE) 1000 MG tablet Take 1,000 mg by mouth 2 (two) times daily after a meal.    [provider]  Multiple Vitamin (MULTIVITAMIN WITH MINERALS) TABS tablet Take 1 tablet by mouth every morning.    [provider]  nicotine (NICODERM CQ - DOSED IN MG/24 HOURS) 21 mg/24hr patch Place 1 patch (21 mg total) onto the skin daily. Patient not taking: Reported on 04/06/2021 12/15/18   Dhungel, [name], MD  omeprazole (PRILOSEC) 20 MG capsule Take 20 mg by mouth 2 (two) times  daily.    [provider]  polyethylene glycol powder (GLYCOLAX/MIRALAX) 17 GM/SCOOP powder Take 17 g by mouth daily as needed for mild constipation. 04/10/21   Lajean Manes, MD  traZODone (DESYREL) 100 MG tablet Take 1 tablet (100 mg total) by mouth at bedtime. Patient taking differently: Take 200 mg by mouth at bedtime as needed for sleep. 12/15/18   Dhungel, [name], MD      Allergies    Patient has no known allergies.    Review of Systems   Review of Systems  Constitutional:  Negative for chills and fever.  HENT:  Negative for ear pain and sore throat.   Eyes:  Negative for pain and visual disturbance.  Respiratory:  Negative for cough and shortness of breath.   Cardiovascular:  Negative for chest pain and palpitations.  Gastrointestinal:  Negative for abdominal pain and vomiting.  Genitourinary:  Negative for dysuria and hematuria.  Musculoskeletal:  Positive for arthralgias. Negative for back pain.       Wound on distal aspect of third digit of left foot.  Pain is associated with radiation to forefoot.  Skin:  Positive for wound.  Neurological:  Negative for seizures and syncope.  All other systems reviewed and are negative.   Physical Exam Updated Vital Signs BP (!) 154/89   Pulse 87   Temp 98.2 F (36.8 C) (Oral)   Resp 18   Ht '6\' 2"'$  (1.88 m)   SpO2 100%   BMI 23.75 kg/m  Physical Exam Vitals and nursing note reviewed.  Constitutional:      General: He is not in acute distress.    Appearance: Normal appearance. He is well-developed. He is not ill-appearing, toxic-appearing or diaphoretic.  HENT:     Head: Normocephalic and atraumatic.     Mouth/Throat:     Mouth: Mucous membranes are moist.     Pharynx: Oropharynx is clear.  Eyes:     General:        Right eye: No discharge.        Left eye: No discharge.     Extraocular Movements: Extraocular movements intact.     Conjunctiva/sclera: Conjunctivae normal.  Cardiovascular:     Rate and Rhythm:  Normal rate and regular rhythm.     Pulses: Normal pulses.     Heart sounds: Normal heart sounds. No murmur heard. Pulmonary:     Effort: Pulmonary effort is normal. No respiratory distress.     Breath sounds: Normal breath sounds.  Abdominal:     Palpations: Abdomen is soft.     Tenderness: There is no abdominal tenderness.  Musculoskeletal:        General: No swelling.     Cervical back: Normal range of motion and neck supple. No rigidity.     Comments: Patient has full active range of motion of bilateral ankle, toes.  Posterior  tibial pulses full and intact bilaterally.  Ankle plantar and dorsiflexion muscle strength 5 out of 5 bilaterally.  No sensory deficits from baseline along major nerve distributions of the lower extremities.  Obvious ulcerated wound noted on the distal aspect of the third toe on the left foot.  Toe has baseline deformity.  Swelling extending into the forefoot with associated erythema.  Tenderness to palpation over affected digit as well as distal third metacarpal.  First and second digit of affected foot amputated.  Skin:    General: Skin is warm and dry.     Capillary Refill: Capillary refill takes less than 2 seconds.  Neurological:     General: No focal deficit present.     Mental Status: He is alert and oriented to person, place, and time.  Psychiatric:        Mood and Affect: Mood normal.     ED Results / Procedures / Treatments   Labs (all labs ordered are listed, but only abnormal results are displayed) Labs Reviewed  COMPREHENSIVE METABOLIC PANEL - Abnormal; Notable for the following components:      Result Value   Sodium 134 (*)    Glucose, Bld 267 (*)    AST 14 (*)    All other components within normal limits  CBC WITH DIFFERENTIAL/PLATELET - Abnormal; Notable for the following components:   Abs Immature Granulocytes 0.08 (*)    All other components within normal limits    EKG None  Radiology DG Foot Complete Left  Result Date:  08/07/2021 CLINICAL DATA:  Possible osteo 3rd digit EXAM: LEFT FOOT - COMPLETE 3+ VIEW COMPARISON:  LEFT foot XRs, most recently 04/06/2021. FINDINGS: Interval amputation of the LEFT second phalanges. Similar appearance of prior LEFT first filling amputation. Soft tissue thickening of LEFT third digit with demineralization, erosion or periosteal change involving distal phalanx. See key image. No acute fracture or dislocation. Small plantar enthesophyte. Vascular calcifications. IMPRESSION: Soft tissue thickening with underlying radiographic changes suspicious for osteomyelitis of the LEFT third distal phalanx. Electronically Signed   By: Michaelle Birks M.D.   On: 08/07/2021 13:49    Procedures Procedures    Medications Ordered in ED Medications  ketorolac (TORADOL) 15 MG/ML injection 15 mg (15 mg Intravenous Given 08/07/21 1345)    ED Course/ Medical Decision Making/ A&P Clinical Course as of 08/07/21 1541  Fri Aug 07, 2021  1426 Consulted ortho Dr. Sammuel Hines, he recommended hard sole shoe, hold antibiotics and Sharol Given will see him in the office on Monday to set up time for amputation.  [CR]    Clinical Course User Index [CR] Wilnette Kales, PA                           Medical Decision Making Amount and/or Complexity of Data Reviewed Labs: ordered. Radiology: ordered.  Risk Prescription drug management.   This patient presents to the ED for concern of left foot pain, this involves an extensive number of treatment options, and is a complaint that carries with it a high risk of complications and morbidity.  The differential diagnosis includes fracture, osteomyelitis, gout, RA, OA, Reiter's, strain/sprain, cellulitis   Co morbidities that complicate the patient evaluation  Chronic cigarette smoker, diabetes mellitus, hypertension   Additional history obtained:  Additional history obtained from surgical note from 04/23/2021 noting second digit amputation.   Lab Tests:  I Ordered, and  personally interpreted labs.  The pertinent results include: No acute abnormalities  Imaging Studies ordered:  I ordered imaging studies including left foot x-ray I independently visualized and interpreted imaging which showed soft tissue thickening with underlying radiographic changes suspicious for osteomyelitis of the left third distal phalanx. I agree with the radiologist interpretation  Cardiac Monitoring: / EKG:  The patient was maintained on a cardiac monitor.  I personally viewed and interpreted the cardiac monitored which showed an underlying rhythm of: Sinus rhythm   Consultations Obtained:  I requested consultation with the Dr. Sammuel Hines of ortho,  and discussed lab and imaging findings as well as pertinent plan - they recommend: Closed outpatient follow-up on Monday to plan future amputation of affected digit.    Problem List / ED Course / Critical interventions / Medication management  Left ankle pain I ordered medication including Toradol for pain   Reevaluation of the patient after these medicines showed that the patient improved I have reviewed the patients home medicines and have made adjustments as needed   Social Determinants of Health:  Chronic cigarette use   Test / Admission - Considered:  Left foot pain Vitals signs significant for hypertension with a blood pressure 154/89.  Patient recommended close PCP follow-up for further blood pressure management/control. Otherwise within normal range and stable throughout visit. imaging studies significant for: Findings consistent with osteomyelitis. Orthopedic consulted regarding patient's status.  Ortho recommended placing the patient in a hard soled shoe, and close follow-up in the clinic on Monday to plan for future surgical intervention.  They also recommended holding antibiotics at this time given the patient does not currently have white count, and wound was relatively unimpressive.  Patient was educated  regarding the tentative treatment plan, he acknowledged understanding and was agreeable to said plan. Worrisome signs and symptoms were discussed with the patient, and the patient acknowledged understanding to return to the ED if noticed. Patient was stable upon discharge.         Final Clinical Impression(s) / ED Diagnoses Final diagnoses:  Osteomyelitis of other site, unspecified type [facility])    Rx / DC Orders ED Discharge Orders          Ordered         oxyCODONE (ROXICODONE) 5 MG immediate release tablet  Every 4 hours PRN        08/07/21 1530              Wilnette Kales, [place] 08/07/21 1541    Lucrezia Starch, MD 08/09/21 [phone]

## 2021-08-07 NOTE — Discharge Instructions (Addendum)
Please call the office of [facility] due to today to set up an appointment on Monday for setting up surgical removal of infected toe.  In the meantime, keep postop shoe on affected foot.  Wash area with warm soapy water.  The orthopedic doctor recommended not to put you on antibiotic at this time. I will prescribe you some medicine to help you with pain through the weekend. Please do not hesitate to return to emergency department for worrisome signs and symptoms we discussed become apparent.

## 2021-08-07 NOTE — ED Triage Notes (Signed)
Pt arrives via [facility] from home for evaluation of toe injury. Pt states that for approximately five days he has had pain to his middle toe as well as serosanguinous drainage. Pt has hx of osteomyelitis on same foot and amputation of great toe and adjacent one. Pt endorses chills at home, unknown fever.

## 2021-08-07 NOTE — Progress Notes (Signed)
Orthopedic Tech Progress Note Patient Details:  Randall Coleman 01-Feb-1951 [id number]  Ortho Devices Type of Ortho Device: Postop shoe/boot Ortho Device/Splint Location: LLE Ortho Device/Splint Interventions: Application   Post Interventions Patient Tolerated: Well  [name] [name] 08/07/2021, 2:56 PM

## 2021-08-10 ENCOUNTER — Encounter: Payer: Self-pay | Admitting: Orthopedic Surgery

## 2021-08-10 ENCOUNTER — Ambulatory Visit (INDEPENDENT_AMBULATORY_CARE_PROVIDER_SITE_OTHER): Payer: No Typology Code available for payment source | Admitting: Orthopedic Surgery

## 2021-08-10 DIAGNOSIS — M869 Osteomyelitis, unspecified: Secondary | ICD-10-CM | POA: Diagnosis not present

## 2021-08-10 NOTE — Progress Notes (Signed)
Office Visit Note   Patient: Randall Coleman           Date of Birth: 1951/02/28           MRN: [id number] Visit Date: 08/10/2021              Requested by: No referring provider defined for this encounter. PCP: Pcp, No  Chief Complaint  Patient presents with   Left Foot - Wound Check      HPI: Patient is a 71 year old gentleman who presents in follow-up with progressive ulceration swelling and drainage left foot third toe.  Patient is status post a second toe great toe amputation over 4 months ago.  Assessment & Plan: Visit Diagnoses:  1. Osteomyelitis of third toe of left foot ([place])     Plan: We will plan for amputation of the third fourth and fifth toes.  The risks and benefits were discussed including risk of the wound not healing need for additional surgery.  Patient states he understands wishes to proceed at this time would prefer surgery on Wednesday.  Follow-Up Instructions: Return in about 2 weeks (around 08/24/2021).   Ortho Exam  Patient is alert, oriented, no adenopathy, well-dressed, normal affect, normal respiratory effort. Patient has a palpable dorsalis pedis pulse.  There are no plantar ulcers there is no ascending cellulitis.  Patient has sausage digit swelling necrosis of the skin and drainage of the third toe with an ulcer that probes to bone.  The great toe and second toe amputations have healed well.   Review of the radiographs shows destructive bony changes of the tuft of the left third toe.  Imaging: No results found. No images are attached to the encounter.  Labs: Lab Results  Component Value Date   HGBA1C 7.9 (H) 04/06/2021   HGBA1C 8.0 (H) 07/29/2020   HGBA1C 7.3 (H) 12/10/2018   REPTSTATUS 04/11/2021 FINAL 04/06/2021   CULT  04/06/2021    NO GROWTH 5 DAYS Performed at [facility], [address] [place], [place] [address]      Lab Results  Component Value Date   ALBUMIN 4.2 08/07/2021   ALBUMIN 3.1 (L) 04/06/2021   ALBUMIN  4.2 12/09/2018    Lab Results  Component Value Date   MG 1.4 (L) 04/18/2021   No results found for: "VD25OH"  No results found for: "PREALBUMIN"    Latest Ref Rng & Units 08/07/2021    1:46 PM 04/18/2021    3:05 PM 04/10/2021    1:32 AM  CBC EXTENDED  WBC 4.0 - 10.5 K/uL 7.8  9.3  5.3   RBC 4.22 - 5.81 MIL/uL 5.19  4.52  3.51   Hemoglobin 13.0 - 17.0 g/dL 15.0  13.1  10.4   HCT 39.0 - 52.0 % 43.3  38.2  30.3   Platelets 150 - 400 K/uL 238  359  194   NEUT# 1.7 - 7.7 K/uL 6.0     Lymph# 0.7 - 4.0 K/uL 1.0        There is no height or weight on file to calculate BMI.  Orders:  No orders of the defined types were placed in this encounter.  No orders of the defined types were placed in this encounter.    Procedures: No procedures performed  Clinical Data: No additional findings.  ROS:  All other systems negative, except as noted in the HPI. Review of Systems  Objective: Vital Signs: There were no vitals taken for this visit.  Specialty Comments:  No specialty comments available.  PMFS History: Patient Active Problem List   Diagnosis Date Noted   Osteomyelitis ([place]) 04/06/2021   Osteomyelitis of second toe of left foot ([place])    Abscess of left great toe    Acute osteomyelitis ([place]) 07/29/2020   Major depressive disorder 12/15/2018   Anxiety 12/15/2018   Foot fracture, right 12/11/2018   Foreign body in lung 12/10/2018   Syncope and collapse 12/10/2018   Foot injury, right, initial encounter 12/10/2018   Essential hypertension 12/10/2018   History of DVT of lower extremity 12/10/2018   Type 2 diabetes mellitus without complication ([place]) 37/34/2876   Unspecified atrial fibrillation ([place]) 12/10/2018   Past Medical History:  Diagnosis Date   Diabetes mellitus without complication ([place])    DVT (deep venous thrombosis) ([place])    Hypertension     History reviewed. No pertinent family history.  Past Surgical History:  Procedure Laterality Date   AMPUTATION  Left 07/30/2020   Procedure: AMPUTATION LEFT GREAT TOE;  Surgeon: [name], MD;  Location: [facility];  Service: Orthopedics;  Laterality: Left;   AMPUTATION Left 04/08/2021   Procedure: AMPUTATION LEFT 2ND TOE;  Surgeon: [name], MD;  Location: [facility];  Service: Orthopedics;  Laterality: Left;   HERNIA REPAIR     HERNIA REPAIR     IR CHEST FLUORO  12/13/2018   Social History   Occupational History   Occupation: retired  Tobacco Use   Smoking status: Every Day    Packs/day: 1.00    Years: 54.00    Total pack years: 54.00    Types: Cigarettes   Smokeless tobacco: Never  Substance and Sexual Activity   Alcohol use: Not Currently    Comment: h/o heavy use, quit about 10 years ago   Drug use: Never    Comment: used when he was younger   Sexual activity: Not Currently

## 2021-08-11 ENCOUNTER — Encounter (HOSPITAL_COMMUNITY): Payer: Self-pay | Admitting: Orthopedic Surgery

## 2021-08-11 NOTE — Progress Notes (Signed)
Pt could not be contacted for pre-op instructions. Sister, Malachy Mood called back and provided information on pt. She lives in [place], [place] and says pt lives alone in [place], does not have any caretakers and she believes he has some forgetfulness and dementia. Pt voicemail is full, cannot leave VM. Malachy Mood (sister) has been trying to reach pt all day today to ask about this upcoming procedure. Per sister, she is usually the one contacted for consents and medical information. Verbal consent with dual nurse verification done. Per sister, ok to reach out DOS for any additional information needed.

## 2021-08-12 ENCOUNTER — Other Ambulatory Visit: Payer: Self-pay

## 2021-08-12 ENCOUNTER — Encounter (HOSPITAL_COMMUNITY)
Admission: RE | Disposition: A | Discharge status: Home / Self Care | Payer: Self-pay | Source: Home / Self Care | Attending: Orthopedic Surgery

## 2021-08-12 ENCOUNTER — Encounter (HOSPITAL_COMMUNITY): Payer: Self-pay | Admitting: Certified Registered Nurse Anesthetist

## 2021-08-12 ENCOUNTER — Ambulatory Visit (HOSPITAL_COMMUNITY)
Admission: RE | Admit: 2021-08-12 | Discharge: 2021-08-12 | Disposition: A | Discharge status: Home / Self Care | Payer: Medicare Other | Attending: Orthopedic Surgery | Admitting: Orthopedic Surgery

## 2021-08-12 DIAGNOSIS — Z89412 Acquired absence of left great toe: Secondary | ICD-10-CM | POA: Diagnosis not present

## 2021-08-12 DIAGNOSIS — Z89422 Acquired absence of other left toe(s): Secondary | ICD-10-CM | POA: Insufficient documentation

## 2021-08-12 DIAGNOSIS — Z538 Procedure and treatment not carried out for other reasons: Secondary | ICD-10-CM | POA: Diagnosis not present

## 2021-08-12 DIAGNOSIS — E1169 Type 2 diabetes mellitus with other specified complication: Secondary | ICD-10-CM | POA: Insufficient documentation

## 2021-08-12 DIAGNOSIS — M869 Osteomyelitis, unspecified: Secondary | ICD-10-CM | POA: Diagnosis not present

## 2021-08-12 LAB — GLUCOSE, CAPILLARY: Glucose-Capillary: 392 mg/dL — ABNORMAL HIGH (ref 70–99)

## 2021-08-12 SURGERY — AMPUTATION DIGIT
Anesthesia: Choice | Laterality: Left

## 2021-08-12 MED ORDER — CEFAZOLIN SODIUM-DEXTROSE 2-4 GM/100ML-% IV SOLN: 2.0000 g | INTRAVENOUS | Status: DC

## 2021-08-12 MED ORDER — INSULIN ASPART 100 UNIT/ML IJ SOLN: 0.0000 [IU] | INTRAMUSCULAR | Status: DC | PRN

## 2021-08-12 MED ORDER — INSULIN ASPART 100 UNIT/ML IJ SOLN
INTRAMUSCULAR | Status: AC
  Administered 2021-08-12: 14 [IU] via SUBCUTANEOUS
  Filled 2021-08-12: qty 1

## 2021-08-12 NOTE — Progress Notes (Signed)
Patient given insulin per peri op glycemic protocol. Per Dr. Gloris Manchester patient is ok for discharge and does not need to remain for observation after insulin administration. Patient ambulatory to lobby with nurse tech escorting him. Patient called [facility] for transportation home.

## 2021-08-12 NOTE — H&P (Signed)
Randall Coleman is an 71 y.o. male.   Chief Complaint: Osteomyelitis left foot third toe HPI: Patient is a 71 year old gentleman who presents in follow-up with progressive ulceration swelling and drainage left foot third toe.  Patient is status post a second toe great toe amputation over 4 months ago.  Past Medical History:  Diagnosis Date   Diabetes mellitus without complication ([place])    DVT (deep venous thrombosis) ([place])    Hypertension     Past Surgical History:  Procedure Laterality Date   AMPUTATION Left 07/30/2020   Procedure: AMPUTATION LEFT GREAT TOE;  Surgeon: [name], MD;  Location: [facility];  Service: Orthopedics;  Laterality: Left;   AMPUTATION Left 04/08/2021   Procedure: AMPUTATION LEFT 2ND TOE;  Surgeon: [name], MD;  Location: [facility];  Service: Orthopedics;  Laterality: Left;   HERNIA REPAIR     HERNIA REPAIR     IR CHEST FLUORO  12/13/2018    History reviewed. No pertinent family history. Social History:  reports that he has been smoking cigarettes. He has a 54.00 pack-year smoking history. He has never used smokeless tobacco. He reports that he does not currently use alcohol. He reports that he does not use drugs.  Allergies: No Known Allergies  No medications prior to admission.    No results found for this or any previous visit (from the past 48 hour(s)). No results found.  Review of Systems  All other systems reviewed and are negative.   There were no vitals taken for this visit. Physical Exam  Patient is alert, oriented, no adenopathy, well-dressed, normal affect, normal respiratory effort. Patient has a palpable dorsalis pedis pulse.  There are no plantar ulcers there is no ascending cellulitis.  Patient has sausage digit swelling necrosis of the skin and drainage of the third toe with an ulcer that probes to bone.  The great toe and second toe amputations have healed well. Assessment/Plan 1. Osteomyelitis of third toe of left foot ([place])        Plan: We will plan for amputation of the third fourth and fifth toes.  The risks and benefits were discussed including risk of the wound not healing need for additional surgery.  Patient states he understands wishes to proceed at this time  [name], MD 08/12/2021, 7:16 AM

## 2021-08-12 NOTE — Progress Notes (Addendum)
When asking questions about care at discharge patient stated that he does not have a ride home and does not have anyone that can stay with him for 24 hours. Patient states to call his apartment complex manager to see if she can pick him up. No answer. Dr. Sharol Given made aware and per Dr. Sharol Given surgery is to be rescheduled. Called social work and per the Development worker, community will not pay for care but another option would be for the patient to pay out of pocket for care. Called the patient's sister who lives in [place] and made her aware that patient does not have transportation home or anyone to stay with him for 24 hours after surgery. Per the patient's sister, she will contact patient's other sibling and they will arrange for someone to be present and transport/stay with patient for whenever patient's surgery is rescheduled. Transition To Care Order placed. Patient made aware and verbalized understanding.  Patient CBG 392. Dr. Gloris Manchester with anesthesia made aware that patient's surgery is going to be rescheduled and Dr. Gloris Manchester made aware of CBG 392 and that patient last took Metformin yesterday. Per Dr. Gloris Manchester give insulin per Peri op Glycemic Control Protocol.

## 2021-08-24 ENCOUNTER — Ambulatory Visit: Payer: No Typology Code available for payment source | Admitting: Orthopedic Surgery

## 2021-08-25 ENCOUNTER — Other Ambulatory Visit: Payer: Self-pay

## 2021-08-25 ENCOUNTER — Emergency Department (HOSPITAL_COMMUNITY): Payer: No Typology Code available for payment source

## 2021-08-25 ENCOUNTER — Telehealth: Payer: Self-pay | Admitting: Orthopedic Surgery

## 2021-08-25 ENCOUNTER — Encounter (HOSPITAL_COMMUNITY): Payer: Self-pay | Admitting: Emergency Medicine

## 2021-08-25 ENCOUNTER — Inpatient Hospital Stay (HOSPITAL_COMMUNITY)
Admission: EM | Admit: 2021-08-25 | Discharge: 2021-09-01 | DRG: 853 | Disposition: A | Discharge status: Rehabilitation Facility | Payer: No Typology Code available for payment source | Attending: Internal Medicine | Admitting: Internal Medicine

## 2021-08-25 DIAGNOSIS — I70262 Atherosclerosis of native arteries of extremities with gangrene, left leg: Secondary | ICD-10-CM | POA: Diagnosis present

## 2021-08-25 DIAGNOSIS — D638 Anemia in other chronic diseases classified elsewhere: Secondary | ICD-10-CM | POA: Diagnosis not present

## 2021-08-25 DIAGNOSIS — L97528 Non-pressure chronic ulcer of other part of left foot with other specified severity: Secondary | ICD-10-CM | POA: Diagnosis not present

## 2021-08-25 DIAGNOSIS — Z79899 Other long term (current) drug therapy: Secondary | ICD-10-CM

## 2021-08-25 DIAGNOSIS — I480 Paroxysmal atrial fibrillation: Secondary | ICD-10-CM | POA: Diagnosis not present

## 2021-08-25 DIAGNOSIS — Z89412 Acquired absence of left great toe: Secondary | ICD-10-CM | POA: Diagnosis not present

## 2021-08-25 DIAGNOSIS — E43 Unspecified severe protein-calorie malnutrition: Secondary | ICD-10-CM | POA: Diagnosis not present

## 2021-08-25 DIAGNOSIS — F17210 Nicotine dependence, cigarettes, uncomplicated: Secondary | ICD-10-CM | POA: Diagnosis present

## 2021-08-25 DIAGNOSIS — Z89422 Acquired absence of other left toe(s): Secondary | ICD-10-CM

## 2021-08-25 DIAGNOSIS — E1169 Type 2 diabetes mellitus with other specified complication: Secondary | ICD-10-CM | POA: Diagnosis not present

## 2021-08-25 DIAGNOSIS — M869 Osteomyelitis, unspecified: Secondary | ICD-10-CM

## 2021-08-25 DIAGNOSIS — E1110 Type 2 diabetes mellitus with ketoacidosis without coma: Secondary | ICD-10-CM | POA: Diagnosis not present

## 2021-08-25 DIAGNOSIS — D62 Acute posthemorrhagic anemia: Secondary | ICD-10-CM | POA: Diagnosis not present

## 2021-08-25 DIAGNOSIS — Z6823 Body mass index (BMI) 23.0-23.9, adult: Secondary | ICD-10-CM | POA: Diagnosis not present

## 2021-08-25 DIAGNOSIS — L02612 Cutaneous abscess of left foot: Secondary | ICD-10-CM | POA: Diagnosis present

## 2021-08-25 DIAGNOSIS — Z86718 Personal history of other venous thrombosis and embolism: Secondary | ICD-10-CM

## 2021-08-25 DIAGNOSIS — L089 Local infection of the skin and subcutaneous tissue, unspecified: Secondary | ICD-10-CM | POA: Diagnosis not present

## 2021-08-25 DIAGNOSIS — I10 Essential (primary) hypertension: Secondary | ICD-10-CM | POA: Diagnosis present

## 2021-08-25 DIAGNOSIS — I4891 Unspecified atrial fibrillation: Secondary | ICD-10-CM

## 2021-08-25 DIAGNOSIS — E119 Type 2 diabetes mellitus without complications: Secondary | ICD-10-CM | POA: Diagnosis not present

## 2021-08-25 DIAGNOSIS — Z89512 Acquired absence of left leg below knee: Secondary | ICD-10-CM | POA: Diagnosis not present

## 2021-08-25 DIAGNOSIS — I739 Peripheral vascular disease, unspecified: Secondary | ICD-10-CM | POA: Diagnosis not present

## 2021-08-25 DIAGNOSIS — S88112A Complete traumatic amputation at level between knee and ankle, left lower leg, initial encounter: Secondary | ICD-10-CM

## 2021-08-25 DIAGNOSIS — Z91148 Patient's other noncompliance with medication regimen for other reason: Secondary | ICD-10-CM | POA: Diagnosis not present

## 2021-08-25 DIAGNOSIS — R269 Unspecified abnormalities of gait and mobility: Secondary | ICD-10-CM | POA: Diagnosis present

## 2021-08-25 DIAGNOSIS — T148XXA Other injury of unspecified body region, initial encounter: Secondary | ICD-10-CM | POA: Diagnosis present

## 2021-08-25 DIAGNOSIS — K5900 Constipation, unspecified: Secondary | ICD-10-CM | POA: Diagnosis not present

## 2021-08-25 DIAGNOSIS — E871 Hypo-osmolality and hyponatremia: Secondary | ICD-10-CM | POA: Diagnosis not present

## 2021-08-25 DIAGNOSIS — A419 Sepsis, unspecified organism: Principal | ICD-10-CM | POA: Diagnosis present

## 2021-08-25 DIAGNOSIS — G546 Phantom limb syndrome with pain: Secondary | ICD-10-CM | POA: Diagnosis not present

## 2021-08-25 DIAGNOSIS — Z7984 Long term (current) use of oral hypoglycemic drugs: Secondary | ICD-10-CM | POA: Diagnosis not present

## 2021-08-25 DIAGNOSIS — I482 Chronic atrial fibrillation, unspecified: Secondary | ICD-10-CM | POA: Diagnosis not present

## 2021-08-25 DIAGNOSIS — M79606 Pain in leg, unspecified: Secondary | ICD-10-CM | POA: Diagnosis not present

## 2021-08-25 DIAGNOSIS — F419 Anxiety disorder, unspecified: Secondary | ICD-10-CM | POA: Diagnosis present

## 2021-08-25 DIAGNOSIS — R4189 Other symptoms and signs involving cognitive functions and awareness: Secondary | ICD-10-CM | POA: Diagnosis not present

## 2021-08-25 DIAGNOSIS — Z7901 Long term (current) use of anticoagulants: Secondary | ICD-10-CM | POA: Diagnosis not present

## 2021-08-25 DIAGNOSIS — D649 Anemia, unspecified: Secondary | ICD-10-CM | POA: Diagnosis not present

## 2021-08-25 HISTORY — DX: Unspecified atrial fibrillation: I48.91

## 2021-08-25 LAB — BLOOD GAS, VENOUS
Acid-Base Excess: 2.8 mmol/L — ABNORMAL HIGH (ref 0.0–2.0)
Bicarbonate: 28.5 mmol/L — ABNORMAL HIGH (ref 20.0–28.0)
Drawn by: 63183
O2 Saturation: 30.5 %
Patient temperature: 36.9
pCO2, Ven: 47 mmHg (ref 44–60)
pH, Ven: 7.39 (ref 7.25–7.43)
pO2, Ven: <31 mmHg — CL (ref 32–45)

## 2021-08-25 LAB — BASIC METABOLIC PANEL
Anion gap: 19 — ABNORMAL HIGH (ref 5–15)
Anion gap: 13 (ref 5–15)
Anion gap: 12 (ref 5–15)
BUN: 27 mg/dL — ABNORMAL HIGH (ref 8–23)
BUN: 24 mg/dL — ABNORMAL HIGH (ref 8–23)
BUN: 25 mg/dL — ABNORMAL HIGH (ref 8–23)
CO2: 17 mmol/L — ABNORMAL LOW (ref 22–32)
CO2: 22 mmol/L (ref 22–32)
CO2: 21 mmol/L — ABNORMAL LOW (ref 22–32)
Calcium: 8.6 mg/dL — ABNORMAL LOW (ref 8.9–10.3)
Calcium: 8.3 mg/dL — ABNORMAL LOW (ref 8.9–10.3)
Calcium: 8.4 mg/dL — ABNORMAL LOW (ref 8.9–10.3)
Chloride: 93 mmol/L — ABNORMAL LOW (ref 98–111)
Chloride: 96 mmol/L — ABNORMAL LOW (ref 98–111)
Chloride: 96 mmol/L — ABNORMAL LOW (ref 98–111)
Creatinine, Ser: 0.98 mg/dL (ref 0.61–1.24)
Creatinine, Ser: 0.8 mg/dL (ref 0.61–1.24)
Creatinine, Ser: 0.74 mg/dL (ref 0.61–1.24)
GFR, Estimated: >60 mL/min (ref >60)
GFR, Estimated: >60 mL/min (ref >60)
GFR, Estimated: >60 mL/min (ref >60)
Glucose, Bld: 383 mg/dL — ABNORMAL HIGH (ref 70–99)
Glucose, Bld: 298 mg/dL — ABNORMAL HIGH (ref 70–99)
Glucose, Bld: 287 mg/dL — ABNORMAL HIGH (ref 70–99)
Potassium: 4.7 mmol/L (ref 3.5–5.1)
Potassium: 4.3 mmol/L (ref 3.5–5.1)
Potassium: 4.4 mmol/L (ref 3.5–5.1)
Sodium: 129 mmol/L — ABNORMAL LOW (ref 135–145)
Sodium: 131 mmol/L — ABNORMAL LOW (ref 135–145)
Sodium: 129 mmol/L — ABNORMAL LOW (ref 135–145)

## 2021-08-25 LAB — URINALYSIS, ROUTINE W REFLEX MICROSCOPIC
Bacteria, UA: NONE SEEN
Bilirubin Urine: NEGATIVE
Glucose, UA: >=500 mg/dL — AB
Hgb urine dipstick: NEGATIVE
Ketones, ur: 80 mg/dL — AB
Leukocytes,Ua: NEGATIVE
Nitrite: NEGATIVE
Protein, ur: NEGATIVE mg/dL
Specific Gravity, Urine: 1.031 — ABNORMAL HIGH (ref 1.005–1.030)
pH: 5 (ref 5.0–8.0)

## 2021-08-25 LAB — CBC WITH DIFFERENTIAL/PLATELET
Abs Immature Granulocytes: 0.14 10*3/uL — ABNORMAL HIGH (ref 0.00–0.07)
Basophils Absolute: 0.1 10*3/uL (ref 0.0–0.1)
Basophils Relative: 0 %
Eosinophils Absolute: 0 10*3/uL (ref 0.0–0.5)
Eosinophils Relative: 0 %
HCT: 43.7 % (ref 39.0–52.0)
Hemoglobin: 14.3 g/dL (ref 13.0–17.0)
Immature Granulocytes: 1 %
Lymphocytes Relative: 5 %
Lymphs Abs: 0.7 10*3/uL (ref 0.7–4.0)
MCH: 27.6 pg (ref 26.0–34.0)
MCHC: 32.7 g/dL (ref 30.0–36.0)
MCV: 84.4 fL (ref 80.0–100.0)
Monocytes Absolute: 1 10*3/uL (ref 0.1–1.0)
Monocytes Relative: 7 %
Neutro Abs: 12.1 10*3/uL — ABNORMAL HIGH (ref 1.7–7.7)
Neutrophils Relative %: 87 %
Platelets: 292 10*3/uL (ref 150–400)
RBC: 5.18 MIL/uL (ref 4.22–5.81)
RDW: 12.4 % (ref 11.5–15.5)
WBC: 14 10*3/uL — ABNORMAL HIGH (ref 4.0–10.5)
nRBC: 0 % (ref 0.0–0.2)

## 2021-08-25 LAB — COMPREHENSIVE METABOLIC PANEL
ALT: 20 U/L (ref 0–44)
AST: 19 U/L (ref 15–41)
Albumin: 2.7 g/dL — ABNORMAL LOW (ref 3.5–5.0)
Alkaline Phosphatase: 106 U/L (ref 38–126)
Anion gap: 16 — ABNORMAL HIGH (ref 5–15)
BUN: 28 mg/dL — ABNORMAL HIGH (ref 8–23)
CO2: 21 mmol/L — ABNORMAL LOW (ref 22–32)
Calcium: 8.8 mg/dL — ABNORMAL LOW (ref 8.9–10.3)
Chloride: 92 mmol/L — ABNORMAL LOW (ref 98–111)
Creatinine, Ser: 1.14 mg/dL (ref 0.61–1.24)
GFR, Estimated: >60 mL/min (ref >60)
Glucose, Bld: 413 mg/dL — ABNORMAL HIGH (ref 70–99)
Potassium: 4.6 mmol/L (ref 3.5–5.1)
Sodium: 129 mmol/L — ABNORMAL LOW (ref 135–145)
Total Bilirubin: 1.2 mg/dL (ref 0.3–1.2)
Total Protein: 6.8 g/dL (ref 6.5–8.1)

## 2021-08-25 LAB — CBG MONITORING, ED
Glucose-Capillary: 433 mg/dL — ABNORMAL HIGH (ref 70–99)
Glucose-Capillary: 309 mg/dL — ABNORMAL HIGH (ref 70–99)

## 2021-08-25 LAB — PROTIME-INR
INR: 1.3 — ABNORMAL HIGH (ref 0.8–1.2)
Prothrombin Time: 15.6 seconds — ABNORMAL HIGH (ref 11.4–15.2)

## 2021-08-25 LAB — MAGNESIUM: Magnesium: 1.8 mg/dL (ref 1.7–2.4)

## 2021-08-25 LAB — BETA-HYDROXYBUTYRIC ACID
Beta-Hydroxybutyric Acid: 3.55 mmol/L — ABNORMAL HIGH (ref 0.05–0.27)
Beta-Hydroxybutyric Acid: 1.32 mmol/L — ABNORMAL HIGH (ref 0.05–0.27)
Beta-Hydroxybutyric Acid: 0.59 mmol/L — ABNORMAL HIGH (ref 0.05–0.27)

## 2021-08-25 LAB — SURGICAL PCR SCREEN
MRSA, PCR: NEGATIVE
Staphylococcus aureus: NEGATIVE

## 2021-08-25 LAB — GLUCOSE, CAPILLARY
Glucose-Capillary: 294 mg/dL — ABNORMAL HIGH (ref 70–99)
Glucose-Capillary: 379 mg/dL — ABNORMAL HIGH (ref 70–99)

## 2021-08-25 LAB — APTT
aPTT: 28 seconds (ref 24–36)
aPTT: 27 seconds (ref 24–36)

## 2021-08-25 LAB — LACTIC ACID, PLASMA
Lactic Acid, Venous: 2 mmol/L (ref 0.5–1.9)
Lactic Acid, Venous: 2.3 mmol/L (ref 0.5–1.9)

## 2021-08-25 LAB — HEPARIN LEVEL (UNFRACTIONATED)
Heparin Unfractionated: <0.1 IU/mL — ABNORMAL LOW (ref 0.30–0.70)
Heparin Unfractionated: <0.1 IU/mL — ABNORMAL LOW (ref 0.30–0.70)

## 2021-08-25 LAB — HIV ANTIBODY (ROUTINE TESTING W REFLEX): HIV Screen 4th Generation wRfx: NONREACTIVE

## 2021-08-25 LAB — TSH: TSH: 1.492 u[IU]/mL (ref 0.350–4.500)

## 2021-08-25 MED ORDER — METOPROLOL TARTRATE 5 MG/5ML IV SOLN
5.0000 mg | Freq: Once | INTRAVENOUS | Status: AC
Start: 2021-08-25 — End: 2021-08-25
  Administered 2021-08-25: 5 mg via INTRAVENOUS
  Filled 2021-08-25: qty 5

## 2021-08-25 MED ORDER — POTASSIUM CHLORIDE 10 MEQ/100ML IV SOLN: 10.0000 meq | INTRAVENOUS | Status: DC

## 2021-08-25 MED ORDER — DEXTROSE IN LACTATED RINGERS 5 % IV SOLN: INTRAVENOUS | Status: DC

## 2021-08-25 MED ORDER — HEPARIN (PORCINE) 25000 UT/250ML-% IV SOLN
2000.0000 [IU]/h | INTRAVENOUS | Status: AC
  Administered 2021-08-25: 1200 [IU]/h via INTRAVENOUS
  Administered 2021-08-26: 1500 [IU]/h via INTRAVENOUS
  Administered 2021-08-27: 1800 [IU]/h via INTRAVENOUS
  Filled 2021-08-25 (×4): qty 250

## 2021-08-25 MED ORDER — SODIUM CHLORIDE 0.9 % IV SOLN: INTRAVENOUS | Status: DC

## 2021-08-25 MED ORDER — NICOTINE 21 MG/24HR TD PT24
21.0000 mg | MEDICATED_PATCH | Freq: Every day | TRANSDERMAL | Status: DC
  Administered 2021-08-25 – 2021-09-01 (×8): 21 mg via TRANSDERMAL
  Filled 2021-08-25 (×8): qty 1

## 2021-08-25 MED ORDER — LACTATED RINGERS IV SOLN: INTRAVENOUS | Status: DC

## 2021-08-25 MED ORDER — METRONIDAZOLE 500 MG/100ML IV SOLN
500.0000 mg | Freq: Two times a day (BID) | INTRAVENOUS | Status: DC
  Administered 2021-08-25 – 2021-08-29 (×9): 500 mg via INTRAVENOUS
  Filled 2021-08-25 (×9): qty 100

## 2021-08-25 MED ORDER — LACTATED RINGERS IV BOLUS
1000.0000 mL | Freq: Once | INTRAVENOUS | Status: AC
  Administered 2021-08-25: 1000 mL via INTRAVENOUS

## 2021-08-25 MED ORDER — ACETAMINOPHEN 325 MG PO TABS
650.0000 mg | ORAL_TABLET | Freq: Four times a day (QID) | ORAL | Status: DC | PRN
  Administered 2021-08-26: 650 mg via ORAL
  Filled 2021-08-25: qty 2

## 2021-08-25 MED ORDER — INSULIN DETEMIR 100 UNIT/ML ~~LOC~~ SOLN
3.0000 [IU] | Freq: Every day | SUBCUTANEOUS | Status: DC
  Administered 2021-08-25 – 2021-08-27 (×3): 3 [IU] via SUBCUTANEOUS
  Filled 2021-08-25 (×4): qty 0.03

## 2021-08-25 MED ORDER — METOPROLOL TARTRATE 50 MG PO TABS
50.0000 mg | ORAL_TABLET | Freq: Two times a day (BID) | ORAL | Status: DC
  Administered 2021-08-25 – 2021-09-01 (×13): 50 mg via ORAL
  Filled 2021-08-25 (×14): qty 1

## 2021-08-25 MED ORDER — DEXTROSE 50 % IV SOLN: 0.0000 mL | INTRAVENOUS | Status: DC | PRN

## 2021-08-25 MED ORDER — INSULIN REGULAR(HUMAN) IN NACL 100-0.9 UT/100ML-% IV SOLN: INTRAVENOUS | Status: DC

## 2021-08-25 MED ORDER — OXYCODONE HCL 5 MG PO TABS
5.0000 mg | ORAL_TABLET | ORAL | Status: DC | PRN
  Administered 2021-08-25 – 2021-08-26 (×3): 5 mg via ORAL
  Filled 2021-08-25 (×3): qty 1

## 2021-08-25 MED ORDER — MUPIROCIN 2 % EX OINT
1.0000 | TOPICAL_OINTMENT | Freq: Two times a day (BID) | CUTANEOUS | Status: AC
  Administered 2021-08-26 – 2021-08-30 (×8): 1 via NASAL
  Filled 2021-08-25 (×3): qty 22

## 2021-08-25 MED ORDER — VANCOMYCIN HCL IN DEXTROSE 1-5 GM/200ML-% IV SOLN
1000.0000 mg | Freq: Two times a day (BID) | INTRAVENOUS | Status: DC
  Administered 2021-08-26 – 2021-08-29 (×7): 1000 mg via INTRAVENOUS
  Filled 2021-08-25 (×10): qty 200

## 2021-08-25 MED ORDER — SODIUM CHLORIDE 0.9 % IV SOLN
2.0000 g | Freq: Three times a day (TID) | INTRAVENOUS | Status: DC
  Administered 2021-08-25 – 2021-08-29 (×13): 2 g via INTRAVENOUS
  Filled 2021-08-25 (×13): qty 12.5

## 2021-08-25 MED ORDER — ACETAMINOPHEN 650 MG RE SUPP: 650.0000 mg | Freq: Four times a day (QID) | RECTAL | Status: DC | PRN

## 2021-08-25 MED ORDER — INSULIN DETEMIR 100 UNIT/ML ~~LOC~~ SOLN
5.0000 [IU] | Freq: Every day | SUBCUTANEOUS | Status: DC
  Filled 2021-08-25: qty 0.05

## 2021-08-25 MED ORDER — VANCOMYCIN HCL 2000 MG/400ML IV SOLN
2000.0000 mg | Freq: Once | INTRAVENOUS | Status: AC
  Administered 2021-08-25: 2000 mg via INTRAVENOUS
  Filled 2021-08-25: qty 400

## 2021-08-25 MED ORDER — MORPHINE SULFATE (PF) 4 MG/ML IV SOLN
6.0000 mg | Freq: Once | INTRAVENOUS | Status: AC
  Administered 2021-08-25: 6 mg via INTRAVENOUS
  Filled 2021-08-25: qty 2

## 2021-08-25 MED ORDER — DILTIAZEM LOAD VIA INFUSION
20.0000 mg | Freq: Once | INTRAVENOUS | Status: AC
  Administered 2021-08-25: 20 mg via INTRAVENOUS
  Filled 2021-08-25: qty 20

## 2021-08-25 MED ORDER — DILTIAZEM HCL-DEXTROSE 125-5 MG/125ML-% IV SOLN (PREMIX)
5.0000 mg/h | INTRAVENOUS | Status: DC
  Administered 2021-08-25: 5 mg/h via INTRAVENOUS
  Administered 2021-08-25 – 2021-08-26 (×2): 15 mg/h via INTRAVENOUS
  Administered 2021-08-26: 5 mg/h via INTRAVENOUS
  Filled 2021-08-25 (×5): qty 125

## 2021-08-25 MED ORDER — INSULIN ASPART 100 UNIT/ML IJ SOLN
0.0000 [IU] | Freq: Three times a day (TID) | INTRAMUSCULAR | Status: DC
  Administered 2021-08-26: 1 [IU] via SUBCUTANEOUS
  Administered 2021-08-26: 3 [IU] via SUBCUTANEOUS
  Administered 2021-08-27: 7 [IU] via SUBCUTANEOUS
  Administered 2021-08-27 (×2): 3 [IU] via SUBCUTANEOUS
  Administered 2021-08-28: 5 [IU] via SUBCUTANEOUS
  Administered 2021-08-28: 7 [IU] via SUBCUTANEOUS
  Administered 2021-08-28: 5 [IU] via SUBCUTANEOUS
  Administered 2021-08-29: 4 [IU] via SUBCUTANEOUS
  Administered 2021-08-29 – 2021-08-30 (×4): 3 [IU] via SUBCUTANEOUS
  Administered 2021-08-30: 7 [IU] via SUBCUTANEOUS
  Administered 2021-08-31: 3 [IU] via SUBCUTANEOUS
  Administered 2021-08-31: 7 [IU] via SUBCUTANEOUS
  Administered 2021-08-31: 3 [IU] via SUBCUTANEOUS
  Administered 2021-09-01: 2 [IU] via SUBCUTANEOUS
  Administered 2021-09-01: 7 [IU] via SUBCUTANEOUS

## 2021-08-25 MED ORDER — MORPHINE SULFATE (PF) 2 MG/ML IV SOLN
2.0000 mg | INTRAVENOUS | Status: DC | PRN
  Administered 2021-08-25: 2 mg via INTRAVENOUS
  Filled 2021-08-25: qty 1

## 2021-08-25 MED ORDER — POTASSIUM CHLORIDE 10 MEQ/100ML IV SOLN
10.0000 meq | INTRAVENOUS | Status: DC
  Administered 2021-08-25 (×2): 10 meq via INTRAVENOUS
  Filled 2021-08-25: qty 100

## 2021-08-25 MED ORDER — INSULIN REGULAR(HUMAN) IN NACL 100-0.9 UT/100ML-% IV SOLN
INTRAVENOUS | Status: DC
  Filled 2021-08-25: qty 100

## 2021-08-25 MED ORDER — INSULIN ASPART 100 UNIT/ML IJ SOLN
8.0000 [IU] | Freq: Once | INTRAMUSCULAR | Status: AC
Start: 2021-08-25 — End: 2021-08-25
  Administered 2021-08-25: 8 [IU] via SUBCUTANEOUS

## 2021-08-25 MED ORDER — METOPROLOL TARTRATE 25 MG PO TABS
25.0000 mg | ORAL_TABLET | Freq: Two times a day (BID) | ORAL | Status: DC
  Administered 2021-08-25: 25 mg via ORAL
  Filled 2021-08-25: qty 1

## 2021-08-25 MED ORDER — METOPROLOL TARTRATE 25 MG PO TABS: 25.0000 mg | ORAL_TABLET | Freq: Two times a day (BID) | ORAL | Status: DC

## 2021-08-25 NOTE — Assessment & Plan Note (Addendum)
Appears to be new finding for him. From [place] records that I can see on the eliquis for his DVT. He does not recall ever having afib or being told he has an irregular heart rhythm.  He was given cardizem bolus and started of cardizem infusion. He is maxed out on cardizem and heart rate continues to be 130-170. He is asymtpomatic and blood pressures stable at 130/80 Will give one dose of IV metoprolol now 5mg .  [facility] ordered TSH/magnesium pending  cha2ds2-vasc of 3  Starting heparin gtt Consulted cardiology, Dr. Algie Coffer who will see patient shortly.  Will not be able to go OR tomorrow until rate controlled Given 2L IVF in ED with maintenance IVF that I have stopped for now.

## 2021-08-25 NOTE — Progress Notes (Signed)
ANTICOAGULATION CONSULT NOTE - Initial Consult  Pharmacy Consult for Heparin Indication: atrial fibrillation & Hx of DVT  No Known Allergies  Patient Measurements: Height: 6\' 2"  (188 cm) Weight: 83.9 kg (184 lb 15.5 oz) IBW/kg (Calculated) : 82.2 Heparin Dosing Weight: 83.9 kg  Vital Signs: Temp: 98.9 F (37.2 C) (06/27 1129) Temp Source: Oral (06/27 1129) BP: 130/86 (06/27 1345) Pulse Rate: 123 (06/27 1345)  Labs: Recent Labs    08/25/21 1139  HGB 14.3  HCT 43.7  PLT 292  APTT 28  LABPROT 15.6*  INR 1.3*  CREATININE 1.14    Estimated Creatinine Clearance: 71.1 mL/min (by C-G formula based on SCr of 1.14 mg/dL).   Medical History: Past Medical History:  Diagnosis Date   Diabetes mellitus without complication (HCC)    DVT (deep venous thrombosis) (HCC)    Hypertension     Medications:  (Not in a hospital admission)  Scheduled:   nicotine  21 mg Transdermal Daily   Infusions:   ceFEPime (MAXIPIME) IV Stopped (08/25/21 1352)   diltiazem (CARDIZEM) infusion 15 mg/hr (08/25/21 1351)   heparin     metronidazole     [START ON 08/26/2021] vancomycin     vancomycin 2,000 mg (08/25/21 1354)   PRN: acetaminophen **OR** acetaminophen, morphine injection, oxyCODONE  Assessment: 70 yom with a history of T2DM, HTN,  hx of DVT on eliquis. Patient is presenting with pain, swelling and redness in his left foot . Heparin per pharmacy consult placed for atrial fibrillation & Hx of DVT  Patient is on apixaban prior to arrival. Last dose 6/26 pm. Will require aPTT monitoring due to likely falsely high anti-Xa level secondary to DOAC use.  Hgb 14.3; plt 292 aPTT 28  Goal of Therapy:  Heparin level 0.3-0.7 units/ml aPTT 66-102 seconds Monitor platelets by anticoagulation protocol: Yes   Plan:  No initial heparin bolus Start heparin infusion at 1200 units/hr Check aPTT & anti-Xa level at 2100 and daily while on heparin Continue to monitor via aPTT until levels are  correlated Continue to monitor H&H and platelets  Delmar Landau, PharmD, BCPS 08/25/2021 2:39 PM ED Clinical Pharmacist -  [phone]

## 2021-08-25 NOTE — Progress Notes (Signed)
IV team assisting Angelito, RN with medications via current 3 IVs.  Per pharmacy Sheppard Coil, RPH, [place], [facility]) compatibilities are as follows IV 1. cardizem and heparin 2. LR or D5LR and insulin 3. maxipime, potassium and flagyl separately.  With this combining of medications patient has adequate access for prescribed treatment.

## 2021-08-25 NOTE — Progress Notes (Signed)
Pharmacy Antibiotic Note  Randall Coleman is a 71 y.o. male admitted on 08/25/2021 with  Wound infection on LLE .  Pharmacy has been consulted for Vancomycin/cefepime dosing.  Scr 1.14 mg/dL (BL 0.8) No previous culture data  Plan: Vancomycin 2000mg  LD x1 followed by 1000mg  q12hr (eAUC 522) Low threshold to adjust dosing based on Scr trend Cefepime 2gm q8hr Plan to obtain levels at steady state if therapy continued  Will monitor for acute changes in renal function and adjust as needed F/u cultures results and de-escalate as appropriate      Temp (24hrs), Avg:98.9 F (37.2 C), Min:98.9 F (37.2 C), Max:98.9 F (37.2 C)  Recent Labs  Lab 08/25/21 1139  WBC 14.0*  CREATININE 1.14    Estimated Creatinine Clearance: 71.1 mL/min (by C-G formula based on SCr of 1.14 mg/dL).    No Known Allergies  Thank you for allowing pharmacy to be a part of this patient's care.  Tomie China, PharmD Clinical Pharmacist  Please check AMION for all [facility] Pharmacy numbers After 10:00 PM, call Main Pharmacy [phone]

## 2021-08-25 NOTE — ED Triage Notes (Signed)
Pt BIB [facility] from home, pt scheduled for surgery on foot tomorrow for toe removal. Weeping noted to L foot with odor, pain usually is in L foot but has now spread up to leg/ CBG 534, hx DM, did not take metformin yesterday or today. Pt in afib, hx of same, HR 88-210, received 150 ml NS, HR after NS was up to 150. BP 102/50, afebrile, RR 36, ETCO2 20. 20g L AC

## 2021-08-25 NOTE — Assessment & Plan Note (Addendum)
On no anti hypertensive medication per [facility]  Monitor as metoprolol started in light of new afib with RVR

## 2021-08-25 NOTE — Assessment & Plan Note (Addendum)
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  -admit to progressive -started on vanc/cefepime. Adding flagyl since t2dm.  -given 2L IVF in ED and started on maintenance IVF. (have held these in setting of afib with RVR) -blood culture pending -lactic acid wnl  -Dr. Lajoyce Corners notified and plans for OR tomorrow; however, we need to medically stabilize him first.  -ABI of LLE wnl on 07/2020.  -on heparin gtt for now with afib with RVR

## 2021-08-26 ENCOUNTER — Inpatient Hospital Stay (HOSPITAL_COMMUNITY): Payer: No Typology Code available for payment source | Admitting: Anesthesiology

## 2021-08-26 ENCOUNTER — Encounter (HOSPITAL_COMMUNITY)
Admission: EM | Disposition: A | Discharge status: Rehabilitation Facility | Payer: Self-pay | Source: Home / Self Care | Attending: Internal Medicine

## 2021-08-26 ENCOUNTER — Ambulatory Visit (HOSPITAL_COMMUNITY): Admission: RE | Admit: 2021-08-26 | Payer: Medicare Other | Source: Home / Self Care | Admitting: Orthopedic Surgery

## 2021-08-26 ENCOUNTER — Inpatient Hospital Stay (HOSPITAL_COMMUNITY): Payer: No Typology Code available for payment source

## 2021-08-26 ENCOUNTER — Encounter (HOSPITAL_COMMUNITY): Payer: Self-pay | Admitting: Family Medicine

## 2021-08-26 ENCOUNTER — Other Ambulatory Visit (HOSPITAL_COMMUNITY): Payer: No Typology Code available for payment source

## 2021-08-26 ENCOUNTER — Other Ambulatory Visit: Payer: Self-pay

## 2021-08-26 DIAGNOSIS — L02612 Cutaneous abscess of left foot: Secondary | ICD-10-CM

## 2021-08-26 DIAGNOSIS — T148XXA Other injury of unspecified body region, initial encounter: Secondary | ICD-10-CM

## 2021-08-26 DIAGNOSIS — A419 Sepsis, unspecified organism: Secondary | ICD-10-CM

## 2021-08-26 DIAGNOSIS — M869 Osteomyelitis, unspecified: Secondary | ICD-10-CM

## 2021-08-26 DIAGNOSIS — F419 Anxiety disorder, unspecified: Secondary | ICD-10-CM

## 2021-08-26 DIAGNOSIS — Z86718 Personal history of other venous thrombosis and embolism: Secondary | ICD-10-CM

## 2021-08-26 DIAGNOSIS — I4891 Unspecified atrial fibrillation: Secondary | ICD-10-CM

## 2021-08-26 DIAGNOSIS — Z7984 Long term (current) use of oral hypoglycemic drugs: Secondary | ICD-10-CM

## 2021-08-26 DIAGNOSIS — I10 Essential (primary) hypertension: Secondary | ICD-10-CM

## 2021-08-26 DIAGNOSIS — E1169 Type 2 diabetes mellitus with other specified complication: Secondary | ICD-10-CM

## 2021-08-26 DIAGNOSIS — E1110 Type 2 diabetes mellitus with ketoacidosis without coma: Secondary | ICD-10-CM

## 2021-08-26 DIAGNOSIS — L089 Local infection of the skin and subcutaneous tissue, unspecified: Secondary | ICD-10-CM

## 2021-08-26 HISTORY — PX: AMPUTATION: SHX166

## 2021-08-26 LAB — HEPARIN LEVEL (UNFRACTIONATED)
Heparin Unfractionated: 0.11 IU/mL — ABNORMAL LOW (ref 0.30–0.70)
Heparin Unfractionated: 0.11 IU/mL — ABNORMAL LOW (ref 0.30–0.70)

## 2021-08-26 LAB — CBC
HCT: 33.3 % — ABNORMAL LOW (ref 39.0–52.0)
HCT: 31.9 % — ABNORMAL LOW (ref 39.0–52.0)
Hemoglobin: 11.4 g/dL — ABNORMAL LOW (ref 13.0–17.0)
Hemoglobin: 11 g/dL — ABNORMAL LOW (ref 13.0–17.0)
MCH: 28.5 pg (ref 26.0–34.0)
MCH: 28.4 pg (ref 26.0–34.0)
MCHC: 34.2 g/dL (ref 30.0–36.0)
MCHC: 34.5 g/dL (ref 30.0–36.0)
MCV: 83.3 fL (ref 80.0–100.0)
MCV: 82.2 fL (ref 80.0–100.0)
Platelets: 215 10*3/uL (ref 150–400)
Platelets: 250 10*3/uL (ref 150–400)
RBC: 4 MIL/uL — ABNORMAL LOW (ref 4.22–5.81)
RBC: 3.88 MIL/uL — ABNORMAL LOW (ref 4.22–5.81)
RDW: 12.5 % (ref 11.5–15.5)
RDW: 12.4 % (ref 11.5–15.5)
WBC: 10.8 10*3/uL — ABNORMAL HIGH (ref 4.0–10.5)
WBC: 10 10*3/uL (ref 4.0–10.5)
nRBC: 0 % (ref 0.0–0.2)
nRBC: 0 % (ref 0.0–0.2)

## 2021-08-26 LAB — ECHOCARDIOGRAM COMPLETE
AR max vel: 3.69 cm2
AV Peak grad: 4.3 mmHg
Ao pk vel: 1.03 m/s
Height: 74 in
S' Lateral: 3.1 cm
Weight: 2959.46 oz

## 2021-08-26 LAB — URINE CULTURE: Culture: NO GROWTH

## 2021-08-26 LAB — HEMOGLOBIN A1C
Hgb A1c MFr Bld: 10.7 % — ABNORMAL HIGH (ref 4.8–5.6)
Mean Plasma Glucose: 260 mg/dL

## 2021-08-26 LAB — GLUCOSE, CAPILLARY
Glucose-Capillary: 287 mg/dL — ABNORMAL HIGH (ref 70–99)
Glucose-Capillary: 327 mg/dL — ABNORMAL HIGH (ref 70–99)
Glucose-Capillary: 273 mg/dL — ABNORMAL HIGH (ref 70–99)
Glucose-Capillary: 208 mg/dL — ABNORMAL HIGH (ref 70–99)
Glucose-Capillary: 150 mg/dL — ABNORMAL HIGH (ref 70–99)
Glucose-Capillary: 219 mg/dL — ABNORMAL HIGH (ref 70–99)
Glucose-Capillary: 224 mg/dL — ABNORMAL HIGH (ref 70–99)

## 2021-08-26 LAB — BETA-HYDROXYBUTYRIC ACID: Beta-Hydroxybutyric Acid: 0.91 mmol/L — ABNORMAL HIGH (ref 0.05–0.27)

## 2021-08-26 SURGERY — AMPUTATION, FOOT, PARTIAL
Anesthesia: General | Site: Foot | Laterality: Left

## 2021-08-26 MED ORDER — HYDRALAZINE HCL 20 MG/ML IJ SOLN: 5.0000 mg | INTRAMUSCULAR | Status: DC | PRN

## 2021-08-26 MED ORDER — ACETAMINOPHEN 325 MG PO TABS
325.0000 mg | ORAL_TABLET | Freq: Four times a day (QID) | ORAL | Status: DC | PRN
  Administered 2021-08-30: 650 mg via ORAL
  Filled 2021-08-26: qty 2

## 2021-08-26 MED ORDER — JUVEN PO PACK: 1.0000 | PACK | Freq: Two times a day (BID) | ORAL | Status: DC

## 2021-08-26 MED ORDER — HYDROMORPHONE HCL 1 MG/ML IJ SOLN
0.5000 mg | INTRAMUSCULAR | Status: DC | PRN
  Administered 2021-08-29 – 2021-08-30 (×4): 1 mg via INTRAVENOUS
  Filled 2021-08-26 (×4): qty 1

## 2021-08-26 MED ORDER — BUPIVACAINE HCL (PF) 0.25 % IJ SOLN
INTRAMUSCULAR | Status: DC | PRN
  Administered 2021-08-26: 10 mL

## 2021-08-26 MED ORDER — PHENOL 1.4 % MT LIQD: 1.0000 | OROMUCOSAL | Status: DC | PRN

## 2021-08-26 MED ORDER — PROPOFOL 10 MG/ML IV BOLUS
INTRAVENOUS | Status: DC | PRN
  Administered 2021-08-26: 120 mg via INTRAVENOUS

## 2021-08-26 MED ORDER — PHENYLEPHRINE HCL-NACL 20-0.9 MG/250ML-% IV SOLN
INTRAVENOUS | Status: DC | PRN
  Administered 2021-08-26: 60 ug/min via INTRAVENOUS

## 2021-08-26 MED ORDER — BISACODYL 5 MG PO TBEC: 5.0000 mg | DELAYED_RELEASE_TABLET | Freq: Every day | ORAL | Status: DC | PRN

## 2021-08-26 MED ORDER — POLYETHYLENE GLYCOL 3350 17 G PO PACK: 17.0000 g | PACK | Freq: Every day | ORAL | Status: DC | PRN

## 2021-08-26 MED ORDER — OXYCODONE HCL 5 MG PO TABS
10.0000 mg | ORAL_TABLET | ORAL | Status: DC | PRN
  Administered 2021-08-26 – 2021-08-30 (×8): 15 mg via ORAL
  Administered 2021-08-31: 10 mg via ORAL
  Administered 2021-08-31 – 2021-09-01 (×6): 15 mg via ORAL
  Filled 2021-08-26 (×14): qty 3

## 2021-08-26 MED ORDER — POTASSIUM CHLORIDE CRYS ER 20 MEQ PO TBCR: 20.0000 meq | EXTENDED_RELEASE_TABLET | Freq: Every day | ORAL | Status: DC | PRN

## 2021-08-26 MED ORDER — OXYCODONE HCL 5 MG PO TABS
5.0000 mg | ORAL_TABLET | ORAL | Status: DC | PRN
  Administered 2021-08-29: 5 mg via ORAL
  Administered 2021-08-29: 10 mg via ORAL
  Administered 2021-08-29: 5 mg via ORAL
  Administered 2021-08-30 (×2): 10 mg via ORAL
  Filled 2021-08-26 (×2): qty 2
  Filled 2021-08-26 (×2): qty 1
  Filled 2021-08-26 (×2): qty 2

## 2021-08-26 MED ORDER — LABETALOL HCL 5 MG/ML IV SOLN: 10.0000 mg | INTRAVENOUS | Status: DC | PRN

## 2021-08-26 MED ORDER — BUPIVACAINE HCL (PF) 0.25 % IJ SOLN
INTRAMUSCULAR | Status: AC
  Filled 2021-08-26: qty 30

## 2021-08-26 MED ORDER — LIDOCAINE 2% (20 MG/ML) 5 ML SYRINGE
INTRAMUSCULAR | Status: AC
  Filled 2021-08-26: qty 5

## 2021-08-26 MED ORDER — MIDAZOLAM HCL 2 MG/2ML IJ SOLN
INTRAMUSCULAR | Status: AC
  Filled 2021-08-26: qty 2

## 2021-08-26 MED ORDER — PROPOFOL 10 MG/ML IV BOLUS
INTRAVENOUS | Status: AC
  Filled 2021-08-26: qty 40

## 2021-08-26 MED ORDER — MAGNESIUM CITRATE PO SOLN: 1.0000 | Freq: Once | ORAL | Status: DC | PRN

## 2021-08-26 MED ORDER — DILTIAZEM HCL ER 60 MG PO CP12
120.0000 mg | ORAL_CAPSULE | Freq: Two times a day (BID) | ORAL | Status: DC
  Administered 2021-08-26 – 2021-09-01 (×12): 120 mg via ORAL
  Filled 2021-08-26 (×13): qty 2

## 2021-08-26 MED ORDER — ALUM & MAG HYDROXIDE-SIMETH 200-200-20 MG/5ML PO SUSP: 15.0000 mL | ORAL | Status: DC | PRN

## 2021-08-26 MED ORDER — SODIUM CHLORIDE 0.9 % IV SOLN: INTRAVENOUS | Status: DC

## 2021-08-26 MED ORDER — LIDOCAINE 2% (20 MG/ML) 5 ML SYRINGE
INTRAMUSCULAR | Status: DC | PRN
  Administered 2021-08-26: 40 mg via INTRAVENOUS

## 2021-08-26 MED ORDER — PHENYLEPHRINE 80 MCG/ML (10ML) SYRINGE FOR IV PUSH (FOR BLOOD PRESSURE SUPPORT)
PREFILLED_SYRINGE | INTRAVENOUS | Status: DC | PRN
  Administered 2021-08-26: 200 ug via INTRAVENOUS
  Administered 2021-08-26: 160 ug via INTRAVENOUS

## 2021-08-26 MED ORDER — DOCUSATE SODIUM 100 MG PO CAPS
100.0000 mg | ORAL_CAPSULE | Freq: Every day | ORAL | Status: DC
  Administered 2021-08-29 – 2021-09-01 (×4): 100 mg via ORAL
  Filled 2021-08-26 (×5): qty 1

## 2021-08-26 MED ORDER — MIDAZOLAM HCL 2 MG/2ML IJ SOLN
INTRAMUSCULAR | Status: DC | PRN
  Administered 2021-08-26: 1 mg via INTRAVENOUS

## 2021-08-26 MED ORDER — ONDANSETRON HCL 4 MG/2ML IJ SOLN
INTRAMUSCULAR | Status: AC
  Filled 2021-08-26: qty 2

## 2021-08-26 MED ORDER — ONDANSETRON HCL 4 MG/2ML IJ SOLN
INTRAMUSCULAR | Status: DC | PRN
  Administered 2021-08-26: 4 mg via INTRAVENOUS

## 2021-08-26 MED ORDER — JUVEN PO PACK
1.0000 | PACK | Freq: Two times a day (BID) | ORAL | Status: DC
  Administered 2021-08-27 – 2021-09-01 (×9): 1 via ORAL
  Filled 2021-08-26 (×9): qty 1

## 2021-08-26 MED ORDER — PANTOPRAZOLE SODIUM 40 MG PO TBEC
40.0000 mg | DELAYED_RELEASE_TABLET | Freq: Every day | ORAL | Status: DC
  Administered 2021-08-26 – 2021-09-01 (×7): 40 mg via ORAL
  Filled 2021-08-26 (×7): qty 1

## 2021-08-26 MED ORDER — METOPROLOL TARTRATE 5 MG/5ML IV SOLN: 2.0000 mg | INTRAVENOUS | Status: DC | PRN

## 2021-08-26 MED ORDER — LACTATED RINGERS IV SOLN: INTRAVENOUS | Status: DC | PRN

## 2021-08-26 MED ORDER — INSULIN ASPART 100 UNIT/ML IJ SOLN
INTRAMUSCULAR | Status: AC
  Administered 2021-08-26: 1 [IU]
  Filled 2021-08-26: qty 1

## 2021-08-26 MED ORDER — 0.9 % SODIUM CHLORIDE (POUR BTL) OPTIME
TOPICAL | Status: DC | PRN
  Administered 2021-08-26: 1000 mL

## 2021-08-26 MED ORDER — BUPIVACAINE HCL (PF) 0.25 % IJ SOLN
INTRAMUSCULAR | Status: AC
  Filled 2021-08-26: qty 10

## 2021-08-26 MED ORDER — ASCORBIC ACID 500 MG PO TABS
1000.0000 mg | ORAL_TABLET | Freq: Every day | ORAL | Status: DC
Start: 2021-08-26 — End: 2021-09-01
  Administered 2021-08-26 – 2021-09-01 (×6): 1000 mg via ORAL
  Filled 2021-08-26 (×6): qty 2

## 2021-08-26 MED ORDER — INSULIN REGULAR(HUMAN) IN NACL 100-0.9 UT/100ML-% IV SOLN
INTRAVENOUS | Status: DC
Start: 2021-08-26 — End: 2021-08-26
  Administered 2021-08-26: 15 [IU]/h via INTRAVENOUS
  Filled 2021-08-26: qty 100

## 2021-08-26 MED ORDER — FENTANYL CITRATE (PF) 250 MCG/5ML IJ SOLN
INTRAMUSCULAR | Status: DC | PRN
Start: 2021-08-26 — End: 2021-08-26
  Administered 2021-08-26: 50 ug via INTRAVENOUS
  Administered 2021-08-26: 100 ug via INTRAVENOUS

## 2021-08-26 MED ORDER — GUAIFENESIN-DM 100-10 MG/5ML PO SYRP: 15.0000 mL | ORAL_SOLUTION | ORAL | Status: DC | PRN

## 2021-08-26 MED ORDER — ONDANSETRON HCL 4 MG/2ML IJ SOLN: 4.0000 mg | Freq: Four times a day (QID) | INTRAMUSCULAR | Status: DC | PRN

## 2021-08-26 MED ORDER — INSULIN ASPART 100 UNIT/ML IJ SOLN
5.0000 [IU] | Freq: Once | INTRAMUSCULAR | Status: AC
  Administered 2021-08-26: 5 [IU] via SUBCUTANEOUS

## 2021-08-26 MED ORDER — ADULT MULTIVITAMIN W/MINERALS CH
1.0000 | ORAL_TABLET | Freq: Every day | ORAL | Status: DC
  Administered 2021-08-26 – 2021-08-30 (×4): 1 via ORAL
  Filled 2021-08-26 (×4): qty 1

## 2021-08-26 MED ORDER — ALBUMIN HUMAN 5 % IV SOLN: INTRAVENOUS | Status: DC | PRN

## 2021-08-26 MED ORDER — DEXAMETHASONE SODIUM PHOSPHATE 10 MG/ML IJ SOLN
INTRAMUSCULAR | Status: AC
  Filled 2021-08-26: qty 1

## 2021-08-26 MED ORDER — MAGNESIUM SULFATE 2 GM/50ML IV SOLN: 2.0000 g | Freq: Every day | INTRAVENOUS | Status: DC | PRN

## 2021-08-26 MED ORDER — ENSURE ENLIVE PO LIQD
237.0000 mL | Freq: Three times a day (TID) | ORAL | Status: DC
  Administered 2021-08-26 (×2): 237 mL via ORAL

## 2021-08-26 MED ORDER — ZINC SULFATE 220 (50 ZN) MG PO CAPS
220.0000 mg | ORAL_CAPSULE | Freq: Every day | ORAL | Status: DC
  Administered 2021-08-26 – 2021-09-01 (×6): 220 mg via ORAL
  Filled 2021-08-26 (×6): qty 1

## 2021-08-26 MED ORDER — FENTANYL CITRATE (PF) 250 MCG/5ML IJ SOLN
INTRAMUSCULAR | Status: AC
  Filled 2021-08-26: qty 5

## 2021-08-26 SURGICAL SUPPLY — 30 items
BAG COUNTER SPONGE SURGICOUNT (BAG) ×2 IMPLANT
BAG SPNG CNTER NS LX DISP (BAG) ×1
BLADE SAW SGTL 18X1.27X75 (BLADE) ×1 IMPLANT
BLADE SAW SGTL HD 18.5X60.5X1. (BLADE) ×2 IMPLANT
BLADE SURG 21 STRL SS (BLADE) ×2 IMPLANT
BNDG CMPR 5X6 CHSV STRCH STRL (GAUZE/BANDAGES/DRESSINGS) ×1
BNDG COHESIVE 6X5 TAN ST LF (GAUZE/BANDAGES/DRESSINGS) ×1 IMPLANT
BNDG GAUZE DERMACEA FLUFF (GAUZE/BANDAGES/DRESSINGS) ×1
BNDG GAUZE DERMACEA FLUFF 4 (GAUZE/BANDAGES/DRESSINGS) IMPLANT
BNDG GZE DERMACEA 4 6PLY (GAUZE/BANDAGES/DRESSINGS) ×1
COVER SURGICAL LIGHT HANDLE (MISCELLANEOUS) ×2 IMPLANT
DRAPE INCISE IOBAN 66X45 STRL (DRAPES) ×2 IMPLANT
DRAPE U-SHAPE 47X51 STRL (DRAPES) ×2 IMPLANT
GLOVE BIOGEL PI IND STRL 9 (GLOVE) ×1 IMPLANT
GLOVE BIOGEL PI INDICATOR 9 (GLOVE) ×1
GLOVE SURG ORTHO 9.0 STRL STRW (GLOVE) ×2 IMPLANT
GOWN STRL REUS W/ TWL XL LVL3 (GOWN DISPOSABLE) ×3 IMPLANT
GOWN STRL REUS W/TWL XL LVL3 (GOWN DISPOSABLE) ×6
KIT BASIN OR (CUSTOM PROCEDURE TRAY) ×2 IMPLANT
KIT TURNOVER KIT B (KITS) ×2 IMPLANT
NS IRRIG 1000ML POUR BTL (IV SOLUTION) ×2 IMPLANT
PACK ORTHO EXTREMITY (CUSTOM PROCEDURE TRAY) ×2 IMPLANT
PAD ARMBOARD 7.5X6 YLW CONV (MISCELLANEOUS) ×4 IMPLANT
PENCIL SMOKE EVACUATOR (MISCELLANEOUS) ×1 IMPLANT
SUT ETHILON 2 0 PSLX (SUTURE) ×2 IMPLANT
TOWEL GREEN STERILE (TOWEL DISPOSABLE) ×2 IMPLANT
TOWEL GREEN STERILE FF (TOWEL DISPOSABLE) ×2 IMPLANT
TUBE CONNECTING 12X1/4 (SUCTIONS) ×2 IMPLANT
WATER STERILE IRR 1000ML POUR (IV SOLUTION) ×2 IMPLANT
YANKAUER SUCT BULB TIP NO VENT (SUCTIONS) ×2 IMPLANT

## 2021-08-26 NOTE — Progress Notes (Signed)
Pt CBG 327 at 0740, Dr. Fransisco Beau made aware. Verbal order for 5 units SQ insulin and initiate endo tool. Orders placed. Pt placed on monitor and associated devices. Will continue to monitor and treat.

## 2021-08-26 NOTE — Consult Note (Addendum)
Ref: Pcp, No   Subjective:  Awake.  Diltiazem drip down to 5 mg./hr. No chest pain.  S/P left transmetatarsal amputation. Atrial fibrillation continues.  Objective:  Vital Signs in the last 24 hours: Temp:  [97.4 F (36.3 C)-98.5 F (36.9 C)] 97.7 F (36.5 C) (06/28 1602) Pulse Rate:  [76-105] 95 (06/28 1602) Cardiac Rhythm: Atrial fibrillation (06/28 1015) Resp:  [16-26] 18 (06/28 1602) BP: (86-176)/(62-146) 101/65 (06/28 1602) SpO2:  [93 %-98 %] 98 % (06/28 1602)  Physical Exam: BP Readings from Last 1 Encounters:  08/26/21 101/65     Wt Readings from Last 1 Encounters:  08/25/21 83.9 kg    Weight change:  Body mass index is 23.75 kg/m. HEENT: [place]/AT, Eyes-Blue, Conjunctiva-Pink, Sclera-Non-icteric Neck: No JVD, No bruit, Trachea midline. Lungs:  Clear, Bilateral. Cardiac:  Irregular rhythm, normal S1 and S2, no S3. II/VI systolic murmur. Abdomen:  Soft, non-tender. BS present. Extremities:  No edema present. No cyanosis. No clubbing. Dressing over left foot stump. CNS: AxOx3, Cranial nerves grossly intact, moves all 4 extremities.  Skin: Warm and dry.   Intake/Output from previous day: 06/27 0701 - 06/28 0700 In: 1707.3 [I.V.:970.5; IV Piggyback:736.8] Out: 800 [Urine:800]    Lab Results: BMET    Component Value Date/Time   NA 129 (L) 08/25/2021 1755   NA 131 (L) 08/25/2021 1656   NA 129 (L) 08/25/2021 1422   K 4.4 08/25/2021 1755   K 4.3 08/25/2021 1656   K 4.7 08/25/2021 1422   CL 96 (L) 08/25/2021 1755   CL 96 (L) 08/25/2021 1656   CL 93 (L) 08/25/2021 1422   CO2 21 (L) 08/25/2021 1755   CO2 22 08/25/2021 1656   CO2 17 (L) 08/25/2021 1422   GLUCOSE 287 (H) 08/25/2021 1755   GLUCOSE 298 (H) 08/25/2021 1656   GLUCOSE 383 (H) 08/25/2021 1422   BUN 25 (H) 08/25/2021 1755   BUN 24 (H) 08/25/2021 1656   BUN 27 (H) 08/25/2021 1422   CREATININE 0.74 08/25/2021 1755   CREATININE 0.80 08/25/2021 1656   CREATININE 0.98 08/25/2021 1422   CALCIUM 8.4  (L) 08/25/2021 1755   CALCIUM 8.3 (L) 08/25/2021 1656   CALCIUM 8.6 (L) 08/25/2021 1422   GFRNONAA >60 08/25/2021 1755   GFRNONAA >60 08/25/2021 1656   GFRNONAA >60 08/25/2021 1422   GFRAA >60 12/14/2018 0444   GFRAA >60 12/13/2018 0530   GFRAA >60 12/12/2018 0507   CBC    Component Value Date/Time   WBC 10.0 08/26/2021 1432   RBC 3.88 (L) 08/26/2021 1432   HGB 11.0 (L) 08/26/2021 1432   HCT 31.9 (L) 08/26/2021 1432   PLT 250 08/26/2021 1432   MCV 82.2 08/26/2021 1432   MCH 28.4 08/26/2021 1432   MCHC 34.5 08/26/2021 1432   RDW 12.4 08/26/2021 1432   LYMPHSABS 0.7 08/25/2021 1139   MONOABS 1.0 08/25/2021 1139   EOSABS 0.0 08/25/2021 1139   BASOSABS 0.1 08/25/2021 1139   HEPATIC Function Panel Recent Labs    04/06/21 1012 08/07/21 1346 08/25/21 1139  PROT 6.3* 7.7 6.8  ALBUMIN 3.1* 4.2 2.7*  AST 28 14* 19  ALT 54* 15 20  ALKPHOS 73 73 106   HEMOGLOBIN A1C Lab Results  Component Value Date   MPG 260 08/25/2021   CARDIAC ENZYMES No results found for: "CKTOTAL", "CKMB", "CKMBINDEX", "TROPONINI" BNP No results for input(s): "PROBNP" in the last 8760 hours. TSH Recent Labs    08/25/21 1422  TSH 1.492   CHOLESTEROL No results  for input(s): "CHOL" in the last 8760 hours.  Scheduled Meds:  vitamin C  1,000 mg Oral Daily   diltiazem  120 mg Oral Q12H   [START ON 08/27/2021] docusate sodium  100 mg Oral Daily   feeding supplement  237 mL Oral TID BM   insulin aspart  0-9 Units Subcutaneous TID WC   insulin detemir  3 Units Subcutaneous QHS   metoprolol tartrate  50 mg Oral BID   multivitamin with minerals  1 tablet Oral Daily   mupirocin ointment  1 Application Nasal BID   nicotine  21 mg Transdermal Daily   [START ON 08/27/2021] nutrition supplement (JUVEN)  1 packet Oral BID   pantoprazole  40 mg Oral Daily   zinc sulfate  220 mg Oral Daily   Continuous Infusions:  sodium chloride     ceFEPime (MAXIPIME) IV 2 g (08/26/21 1238)   heparin 1,500 Units/hr  (08/26/21 1034)   lactated ringers 50 mL/hr at 08/26/21 0600   magnesium sulfate bolus IVPB     metronidazole 500 mg (08/26/21 1512)   vancomycin 1,000 mg (08/26/21 1623)   PRN Meds:.[START ON 08/27/2021] acetaminophen, alum & mag hydroxide-simeth, bisacodyl, dextrose, guaiFENesin-dextromethorphan, hydrALAZINE, HYDROmorphone (DILAUDID) injection, labetalol, magnesium sulfate bolus IVPB, metoprolol tartrate, ondansetron, oxyCODONE, oxyCODONE, phenol, polyethylene glycol, potassium chloride  Assessment/Plan:  Atrial fibrillation, rate controlled Type 2 DM with hyperglycemia PVD Left foot amputation Chronic anticoagulation use  Plan: May hold IV heparin 24-48 hours if needed for bleed control from surgical site. Change IV diltiazem to PO.   LOS: 1 day   Time spent including chart review, lab review, examination, discussion with patient/Nurse : 30 min   Dixie Dials  MD  08/26/2021, 5:47 PM

## 2021-08-26 NOTE — Progress Notes (Signed)
Initial Nutrition Assessment  DOCUMENTATION CODES:   Severe malnutrition in context of chronic illness  INTERVENTION:   Ensure Enlive po TID, each supplement provides 350 kcal and 20 grams of protein.  MVI with minerals daily.  Vitamin C 1,000 mg daily.  Zinc 220 mg daily x 14 days (end date 7/12).  Juven 1 packet PO BID, each packet provides 80 calories, 8 grams of carbohydrate, 2.5  grams of protein (collagen), 7 grams of L-arginine and 7 grams of L-glutamine; supplement contains CaHMB, Vitamins C, E, B12 and Zinc to promote wound healing.  NUTRITION DIAGNOSIS:   Severe Malnutrition related to chronic illness (uncontrolled DM with A1C=10.7) as evidenced by severe muscle depletion, severe fat depletion.  GOAL:   Patient will meet greater than or equal to 90% of their needs  MONITOR:   PO intake, Supplement acceptance, Labs, Skin  REASON FOR ASSESSMENT:   Consult Wound healing  ASSESSMENT:   71 yo male admitted with sepsis r/t left foot osteomyelitis, DKA. PMH includes DM-2, HTN, DVT, A fib with RVR, HTN, smoker.  6/28 - L transmetatarsal amputation  Patient reports poor appetite for the past couple of days d/t pain and acute infection. Since then he has been eating well. Lunch tray at bedside ~50% complete.  He usually takes a multivitamin daily at home. Discussed the importance of adequate protein intake to promote healing. He agreed to drink Juven BID and Ensure supplements TID to maximize oral intake of protein and calories. Will schedule Juven with meals and Ensure Plus High Protein between meals.   Labs reviewed. Na 129 A1C 10.7 on 6/27 CBG: [phone]  Medications reviewed and include vitamin C, Colace, Novolog, Levemir, Juven, zinc sulfate. IVF: LR at 50 ml/h.   Patient meets criteria for severe malnutrition, given severe depletion of muscle and subcutaneous fat mass. This is likely related to uncontrolled DM.   NUTRITION - FOCUSED PHYSICAL  EXAM:  Flowsheet Row Most Recent Value  Orbital Region Severe depletion  Upper Arm Region Severe depletion  Thoracic and Lumbar Region Severe depletion  Buccal Region Severe depletion  Temple Region Severe depletion  Clavicle Bone Region Moderate depletion  Clavicle and Acromion Bone Region Moderate depletion  Scapular Bone Region Moderate depletion  Dorsal Hand Moderate depletion  Patellar Region Severe depletion  Anterior Thigh Region Severe depletion  Posterior Calf Region Severe depletion  Edema (RD Assessment) Moderate  Hair Reviewed  Eyes Reviewed  Mouth Reviewed  Skin Reviewed  Nails Reviewed       Diet Order:   Diet Order             Diet Carb Modified Fluid consistency: Thin; Room service appropriate? Yes  Diet effective now                   EDUCATION NEEDS:   Education needs have been addressed  Skin:  Skin Assessment: Skin Integrity Issues: Skin Integrity Issues:: Other (Comment) Other: S/P L foot TMA  Last BM:  no BM documented  Height:   Ht Readings from Last 1 Encounters:  08/25/21 '6\' 2"'$  (1.88 m)    Weight:   Wt Readings from Last 1 Encounters:  08/25/21 83.9 kg    Ideal Body Weight:  86.4 kg  BMI:  Body mass index is 23.75 kg/m.  Estimated Nutritional Needs:   Kcal:  2300-2500  Protein:  120-130 gm  Fluid:  2.3-2.5 L    Lucas Mallow RD, LDN, CNSC Please refer to Amion for contact information.

## 2021-08-26 NOTE — Progress Notes (Signed)
Inpatient Rehab Admissions Coordinator:   Consult received and chart reviewed.  Await therapy recs to determine post-op therapy needs.    Shann Medal, PT, DPT Admissions Coordinator [phone] 08/26/21  11:17 AM

## 2021-08-26 NOTE — Progress Notes (Signed)
PROGRESS NOTE    Randall Coleman  MEB:[id number] DOB: 1951-01-28 DOA: 08/25/2021 PCP: Merryl Hacker, No    Brief Narrative:  [name] is a 71 y.o. male with medical history of type 2 diabetes, hypertension, history of DVT on Eliquis presented to hospital with left foot infection.  Patient endorsed having pain swelling and redness of the foot and had foul-smelling discharge and ambulatory dysfunction.  In the ED patient was afebrile with mild leukocytosis and hyponatremia.  Lactate was elevated at 2.0.  Chest x-ray without any obvious infiltrate.  X-ray of the foot showed findings schertz tip of osteomyelitis involving the distal third digit.  Patient was given 2 L of IV fluids, vancomycin and cefepime and Dr. Sharol Given was notified.  Patient was then admitted hospital for further evaluation and treatment.    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  Continue vanc/cefepime, flagyl.  ABI of the left lower extremity was normal on 08/06/2020.  Orthopedic recommended nonweightbearing on the left with IV antibiotics.  As per orthopedics might need a below-knee amputation on Friday.  Follow orthopedic recommendations.   Atrial fibrillation with RVR ([place]) On presentation.  Required Cardizem drip.  Was on heparin drip for surgical intervention.  On Eliquis as outpatient.  Check 2D echocardiogram.  Patient was seen by cardiology during hospitalization.  Will follow cardiology recommendation.  Continue metoprolol.  DKA, type 2  Likely secondary to sepsis from left foot osteomyelitis.  Received IV fluids and insulin while  in the hospital.  Latest hemoglobin A1c of 7.9 in 04/2021.  Repeat hemoglobin A1c at 10.7 on 08/25/2021.     History of DVT of lower extremity On eliquis as outpatient.  Received heparin drip     Essential hypertension Was not on any medications at home.  Has been started on metoprolol during hospitalization.  History of cigarette smoking.  On nicotine patch.    DVT prophylaxis: SCD's Start: 08/26/21 1056   Code Status:     Code Status: Full Code  Disposition: Home with home health/CIR.  PT evaluation pending.  Status is: Inpatient  Remains inpatient appropriate because: Status post amputation, IV antibiotic,   Family Communication:  Communicated with the patient's sister at bedside.  Consultants:  Orthopedics Dr Sharol Given.  Procedures:  Left transmetatarsal amputation on 08/26/2021.  Antimicrobials:  Vancomycin, Flagyl, cefepime  Anti-infectives (From admission, onward)    Start     Dose/Rate Route Frequency Ordered Stop   08/26/21 0200  vancomycin (VANCOCIN) IVPB 1000 mg/200 mL premix        1,000 mg 200 mL/hr over 60 Minutes Intravenous Every 12 hours 08/25/21 1225     08/25/21 1430  metroNIDAZOLE (FLAGYL) IVPB 500 mg        500 mg 100 mL/hr over 60 Minutes Intravenous Every 12 hours 08/25/21 1422     08/25/21 1300  ceFEPIme (MAXIPIME) 2 g in sodium chloride 0.9 % 100 mL IVPB        2 g 200 mL/hr over 30 Minutes Intravenous Every 8 hours 08/25/21 1220     08/25/21 1230  vancomycin (VANCOREADY) IVPB 2000 mg/400  mL        2,000 mg 200 mL/hr over 120 Minutes Intravenous  Once 08/25/21 1220 08/25/21 1554       Subjective: Today, patient was seen and examined at bedside.  Seen after transmetatarsal amputation and complains of mild foot pain.  Denies any shortness of breath chest pain dizziness lightheadedness.  Objective: Vitals:   08/26/21 1015 08/26/21 1035 08/26/21 1137 08/26/21 1241  BP: 108/73 (!) 176/146 91/62 104/71  Pulse: 80  85 93  Resp: (!) '21  18 16   '$ Temp: (!) 97.4 F (36.3 C)  98.2 F (36.8 C)   TempSrc:   Oral   SpO2: 98%  98% 95%  Weight:      Height:        Intake/Output Summary (Last 24 hours) at 08/26/2021 1324 Last data filed at 08/26/2021 1019 Gross per 24 hour  Intake 2557.25 ml  Output 1250 ml  Net 1307.25 ml   Filed Weights   08/25/21 1438  Weight: 83.9 kg    Physical Examination: Body mass index is 23.75 kg/m.   General:  Average built, not in obvious distress HENT:   No scleral pallor or icterus noted. Oral mucosa is moist.  Chest:  Clear breath sounds.  Diminished breath sounds bilaterally. No crackles or wheezes.  CVS: S1 &S2 heard. No murmur.  Regular rate and rhythm. Abdomen: Soft, nontender, nondistended.  Bowel sounds are heard.   Extremities: No cyanosis, clubbing or edema.  Peripheral pulses are palpable.  Status post left transmetatarsal amputation with dressing.[name] Psych: Alert, awake and oriented, normal mood CNS:  No cranial nerve deficits.  Power equal in all extremities.   Skin: Warm and dry.  No rashes noted.  Data Reviewed:   CBC: Recent Labs  Lab 08/25/21 1139 08/26/21 0657  WBC 14.0* 10.8*  NEUTROABS 12.1*  --   HGB 14.3 11.4*  HCT 43.7 33.3*  MCV 84.4 83.3  PLT 292 505    Basic Metabolic Panel: Recent Labs  Lab 08/25/21 1139 08/25/21 1422 08/25/21 1656 08/25/21 1755  NA 129* 129* 131* 129*  K 4.6 4.7 4.3 4.4  CL 92* 93* 96* 96*  CO2 21* 17* 22 21*  GLUCOSE 413* 383* 298* 287*  BUN 28* 27* 24* 25*  CREATININE 1.14 0.98 0.80 0.74  CALCIUM 8.8* 8.6* 8.3* 8.4*  MG  --  1.8  --   --     Liver Function Tests: Recent Labs  Lab 08/25/21 1139  AST 19  ALT 20  ALKPHOS 106  BILITOT 1.2  PROT 6.8  ALBUMIN 2.7*     Radiology Studies: ECHOCARDIOGRAM COMPLETE  Result Date: 08/26/2021    ECHOCARDIOGRAM REPORT   Patient Name:   Randall Coleman Date of Exam: 08/26/2021 Medical Rec #:  [id number]      Height:       74.0 in Accession #:    [id number]     Weight:        185.0 lb Date of Birth:  06-Jul-1950       BSA:          2.102 m Patient Age:    59 years       BP:           91/62 mmHg Patient Gender: M              HR:           90 bpm. Exam Location:  Inpatient Procedure: 2D Echo, Cardiac Doppler and Color Doppler  Indications:    Atrial Fibrillation  History:        Patient has prior history of Echocardiogram examinations, most                 recent 12/10/2018. Arrythmias:Atrial Fibrillation; Risk                 Factors:Hypertension and Diabetes.  Sonographer:    Jefferey Pica Referring Phys: [id number] [place]  1. Left ventricular ejection fraction, by estimation, is 40 to 45%. The left ventricle has mildly decreased function. The left ventricle demonstrates global hypokinesis. There is mild left ventricular hypertrophy. Left ventricular diastolic parameters are indeterminate.  2. Right ventricular systolic function is normal. The right ventricular size is mildly enlarged. There is mildly elevated pulmonary artery systolic pressure.  3. Left atrial size was moderately dilated.  4. Right atrial size was mildly dilated.  5. The mitral valve is normal in structure. Trivial mitral valve regurgitation. No evidence of mitral stenosis.  6. The aortic valve is normal in structure. Aortic valve regurgitation is not visualized. No aortic stenosis is present.  7. The inferior vena cava is normal in size with greater than 50% respiratory variability, suggesting right atrial pressure of 3 mmHg. Comparison(s): The left ventricular function is worsened. FINDINGS  Left Ventricle: Left ventricular ejection fraction, by estimation, is 40 to 45%. The left ventricle has mildly decreased function. The left ventricle demonstrates global hypokinesis. The left ventricular internal cavity size was normal in size. There is  mild left ventricular hypertrophy. Left ventricular diastolic parameters are indeterminate. Right Ventricle: The right ventricular size is mildly enlarged. No  increase in right ventricular wall thickness. Right ventricular systolic function is normal. There is mildly elevated pulmonary artery systolic pressure. The tricuspid regurgitant velocity is 2.36 m/s, and with an assumed right atrial pressure of 15 mmHg, the estimated right ventricular systolic pressure is 38.1 mmHg. Left Atrium: Left atrial size was moderately dilated. Right Atrium: Right atrial size was mildly dilated. Pericardium: There is no evidence of pericardial effusion. Mitral Valve: The mitral valve is normal in structure. Trivial mitral valve regurgitation. No evidence of mitral valve stenosis. Tricuspid Valve: The tricuspid valve is normal in structure. Tricuspid valve regurgitation is mild . No evidence of tricuspid stenosis. Aortic Valve: The aortic valve is normal in structure. Aortic valve regurgitation is not visualized. No aortic stenosis is present. Aortic valve peak gradient measures 4.3 mmHg. Pulmonic Valve: The pulmonic valve was normal in structure. Pulmonic valve regurgitation is not visualized. No evidence of pulmonic stenosis. Aorta: The aortic root is normal in size and structure. Venous: The inferior vena cava is normal in size with greater than 50% respiratory variability, suggesting right atrial pressure of 3 mmHg. IAS/Shunts: No atrial level shunt detected by color flow Doppler.  LEFT VENTRICLE PLAX 2D LVIDd:         3.90 cm LVIDs:         3.10 cm LV PW:         1.20 cm LV IVS:        1.20 cm LVOT diam:     2.40 cm LV SV:         70 LV SV Index:   33 LVOT Area:     4.52 cm  RIGHT VENTRICLE          IVC RV Basal diam:  3.60 cm  IVC diam: 2.90 cm RV Mid diam:    4.10 cm LEFT ATRIUM  Index        RIGHT ATRIUM           Index LA diam:        4.60 cm  2.19 cm/m   RA Area:     23.60 cm LA Vol (A2C):   96.9 ml  46.09 ml/m  RA Volume:   76.50 ml  36.39 ml/m LA Vol (A4C):   103.0 ml 48.99 ml/m LA Biplane Vol: 100.0 ml 47.56 ml/m  AORTIC VALVE                 PULMONIC VALVE  AV Area (Vmax): 3.69 cm     PV Vmax:       0.75 m/s AV Vmax:        103.16 cm/s  PV Peak grad:  2.3 mmHg AV Peak Grad:   4.3 mmHg LVOT Vmax:      84.20 cm/s LVOT Vmean:     55.925 cm/s LVOT VTI:       0.154 m  AORTA Ao Root diam: 3.70 cm Ao Asc diam:  3.40 cm TRICUSPID VALVE TR Peak grad:   22.3 mmHg TR Vmax:        236.00 cm/s  SHUNTS Systemic VTI:  0.15 m Systemic Diam: 2.40 cm Candee Furbish MD Electronically signed by Candee Furbish MD Signature Date/Time: 08/26/2021/12:52:09 PM    Final    DG Foot 2 Views Left  Result Date: 08/25/2021 CLINICAL DATA:  Surgery on foot tomorrow for toe removal.  Pain. EXAM: LEFT FOOT - 2 VIEW COMPARISON:  08/07/2021 FINDINGS: Prior resection of the first and second phalanges. Soft tissue swelling and minimal soft tissue gas about the medial forefoot. Ill definition of the second distal metatarsal is relatively similar. There is osseous irregularity involving the distal third phalanx with increased fragmentation. The middle phalanx of the third digit demonstrates osteolysis, new. Small calcaneal spur. IMPRESSION: Findings suspicious for osteomyelitis involving the distal third digit, progressive. Cannot exclude osteomyelitis involving the distal portion of the second metatarsal. Electronically Signed   By: Abigail Miyamoto M.D.   On: 08/25/2021 12:07   DG Chest Port 1 View  Result Date: 08/25/2021 CLINICAL DATA:  Sepsis. EXAM: PORTABLE CHEST 1 VIEW COMPARISON:  04/18/2021 FINDINGS: The heart size and mediastinal contours are within normal limits. Both lungs are clear. IMPRESSION: No active disease. Electronically Signed   By: Marlaine Hind M.D.   On: 08/25/2021 12:03      LOS: 1 day    Flora Lipps, MD [facility] Available via Epic secure chat 7am-7pm After these hours, please refer to coverage provider listed on [email or web address] 08/26/2021, 1:24 PM

## 2021-08-26 NOTE — Progress Notes (Signed)
ANTICOAGULATION CONSULT NOTE  Pharmacy Consult for Heparin Indication: atrial fibrillation and hx of VTE  No Known Allergies  Patient Measurements: Height: '6\' 2"'$  (188 cm) Weight: 83.9 kg (184 lb 15.5 oz) IBW/kg (Calculated) : 82.2  Heparin Dosing Weight: 84 kg  Vital Signs: Temp: 97.7 F (36.5 C) (06/28 1602) Temp Source: Oral (06/28 1137) BP: 101/65 (06/28 1602) Pulse Rate: 95 (06/28 1602)  Labs: Recent Labs    08/25/21 1139 08/25/21 1422 08/25/21 1515 08/25/21 1656 08/25/21 1755 08/25/21 2117 08/26/21 0657 08/26/21 1432 08/26/21 1648  HGB 14.3  --   --   --   --   --  11.4* 11.0*  --   HCT 43.7  --   --   --   --   --  33.3* 31.9*  --   PLT 292  --   --   --   --   --  215 250  --   APTT 28  --   --   --   --  27  --   --   --   LABPROT 15.6*  --   --   --   --   --   --   --   --   INR 1.3*  --   --   --   --   --   --   --   --   HEPARINUNFRC  --   --    < >  --   --  <0.10* 0.11*  --  0.11*  CREATININE 1.14 0.98  --  0.80 0.74  --   --   --   --    < > = values in this interval not displayed.     Estimated Creatinine Clearance: 101.3 mL/min (by C-G formula based on SCr of 0.74 mg/dL).   Assessment: 54 yom with a history of DVT and atrial fibrillation on Eliquis PTA (LD 6.26). Patient presented with pain, swelling and redness in his left foot. Heparin per pharmacy consult placed while Eliquis on hold.   Heparin level remains subtherapeutic (0.11) on heparin infusion at 1500 units/hr. S/p L metatarsal amputation today. RN notes that wound is bleeding but not copiously. RN states that the heparin gtt was leaking a bit earlier but they have redressed the heparin site and doesn't seem to be having any problems now.  Goal of Therapy:  Heparin level 0.3-0.7 units/ml (aim for 0.3-0.5 since post amputation) Monitor platelets by anticoagulation protocol: Yes   Plan:  Increase heparin infusion slightly to 1650 units/hr Check heparin level in 6 hours  F/u closely  for any increase in bleeding   Thank you for allowing pharmacy to be a part of this patient's care.  Sherlon Handing, PharmD, BCPS Please see [email or web address] for complete clinical pharmacist phone list 08/26/2021 5:49 PM

## 2021-08-26 NOTE — Progress Notes (Signed)
ANTICOAGULATION CONSULT NOTE - Follow Up Consult  Pharmacy Consult for heparin Indication:  Afib and h/o VTE  Labs: Recent Labs    08/25/21 1139 08/25/21 1422 08/25/21 1515 08/25/21 1656 08/25/21 1755 08/25/21 2117  HGB 14.3  --   --   --   --   --   HCT 43.7  --   --   --   --   --   PLT 292  --   --   --   --   --   APTT 28  --   --   --   --  27  LABPROT 15.6*  --   --   --   --   --   INR 1.3*  --   --   --   --   --   HEPARINUNFRC  --   --  <0.10*  --   --  <0.10*  CREATININE 1.14 0.98  --  0.80 0.74  --     Assessment: 70yo male subtherapeutic on heparin with initial dosing while Eliquis on hold; no infusion issues or signs of bleeding per RN.  Heparin level is undetectable, would usually be elevated with recent DOAC use.  Goal of Therapy:  Heparin level 0.3-0.7 units/ml   Plan:  Will increase heparin infusion by 4 units/kg/hr to 1500 units/hr and check level in 6 hours.    Wynona Neat, PharmD, BCPS  08/26/2021,12:11 AM

## 2021-08-26 NOTE — Evaluation (Signed)
Diltiazem drip cont at 23m/hr. Previous order no longer active. Cardiology paged  for clarification. Awaiting response.

## 2021-08-26 NOTE — Transfer of Care (Signed)
Immediate Anesthesia Transfer of Care Note  Patient: Randall Coleman  Procedure(s) Performed: Left Partial Foot Amputation (Left: Foot)  Patient Location: PACU  Anesthesia Type:General  Level of Consciousness: awake, alert  and oriented  Airway & Oxygen Therapy: Patient Spontanous Breathing and Patient connected to nasal cannula oxygen  Post-op Assessment: Report given to RN and Post -op Vital signs reviewed and stable, albumin given for BP, patient comfortable A&O NAD, insulin gtt started goal 140-180 per Dr. Fransisco Beau  Post vital signs: Reviewed and stable  Last Vitals:  Vitals Value Taken Time  BP 85/57 08/26/21 0908  Temp    Pulse 90 08/26/21 0908  Resp 18 08/26/21 0908  SpO2 93 % 08/26/21 0908  Vitals shown include unvalidated device data.  Last Pain:  Vitals:   08/26/21 0600  TempSrc:   PainSc: 8          Complications: No notable events documented.

## 2021-08-26 NOTE — Progress Notes (Signed)
ANTICOAGULATION CONSULT NOTE  Pharmacy Consult for Heparin Indication: atrial fibrillation and hx of VTE  No Known Allergies  Patient Measurements: Height: '6\' 2"'$  (188 cm) Weight: 83.9 kg (184 lb 15.5 oz) IBW/kg (Calculated) : 82.2  Heparin Dosing Weight: 84 kg  Vital Signs: Temp: 98.2 F (36.8 C) (06/28 1137) Temp Source: Oral (06/28 1137) BP: 104/71 (06/28 1241) Pulse Rate: 93 (06/28 1241)  Labs: Recent Labs    08/25/21 1139 08/25/21 1422 08/25/21 1515 08/25/21 1656 08/25/21 1755 08/25/21 2117 08/26/21 0657  HGB 14.3  --   --   --   --   --  11.4*  HCT 43.7  --   --   --   --   --  33.3*  PLT 292  --   --   --   --   --  215  APTT 28  --   --   --   --  27  --   LABPROT 15.6*  --   --   --   --   --   --   INR 1.3*  --   --   --   --   --   --   HEPARINUNFRC  --   --  <0.10*  --   --  <0.10* 0.11*  CREATININE 1.14 0.98  --  0.80 0.74  --   --     Estimated Creatinine Clearance: 101.3 mL/min (by C-G formula based on SCr of 0.74 mg/dL).   Assessment: 8 yom with a history of DVT and atrial fibrillation on Eliquis PTA (LD 6.26). Patient presented with pain, swelling and redness in his left foot. Heparin per pharmacy consult placed while Eliquis on hold.   Anticipated to have falsely high anti-Xa level secondary to DOAC use, however anti-Xa levels have remained undetectable since admission. Unsure if consistently taking DOAC PTA. Will DC aPTT monitoring and continue adjusting heparin levels based on anti-Xa instead.   Heparin level subtherapeutic 0.11 despite recent increase to 1500 units/hr. Heparin stopped during surgery, unclear if level drawn before heparin was stopped. No issues with heparin infusion or signs bleeding reported. CBC stable, platelets 215. Heparin now resumed as of 10:30. Will redrawn heparin level in 6 hours and adjust if needed.   Goal of Therapy:  Heparin level 0.3-0.7 units/ml aPTT 66-102 seconds Monitor platelets by anticoagulation  protocol: Yes   Plan:  Continue heparin infusion at 1500 units/hr Check heparin level in 6 hours and daily while on heparin Continue to monitor H&H and platelets    Thank you for allowing pharmacy to be a part of this patient's care.  Ardyth Harps, PharmD Clinical Pharmacist

## 2021-08-26 NOTE — Evaluation (Addendum)
Occupational Therapy Evaluation Patient Details Name: Randall Coleman MRN: [id number] DOB: Aug 27, 1950 Today's Date: 08/26/2021   History of Present Illness 71 y.o. male with medical history significant of T2DM, HTN,  hx of DVT on eliquis who presented to ED with concerns for left foot infection.  S/p L transmet amputation with NWB 6/28.   Clinical Impression   Patient admitted for the procedure above.  PTA he was living alone in an apartment.  Patient stating he was walking on th e left foot without an AD, continues to drive, and was completing light meal prep and his own ADL.  Deficits are listed below, but decreased problem solving and ability to maintain his NWB status are primary.  OT will follow in the acute setting, but he would benefit from an aggressive multi disciplined approach to post acute rehab.  The patient can tolerate 3+ hours of rehab per day to maximize his functional status.  His long term goal is to move closer to his sister in [place].  Sounding like an ALF type situation.  Was able to locate a drop arm recliner, and advised RN to lateral scoot back to bed.        Recommendations for follow up therapy are one component of a multi-disciplinary discharge planning process, led by the attending physician.  Recommendations may be updated based on patient status, additional functional criteria and insurance authorization.   Follow Up Recommendations  Acute inpatient rehab (3hours/day)    Assistance Recommended at Discharge Frequent or constant Supervision/Assistance  Patient can return home with the following A lot of help with bathing/dressing/bathroom;A lot of help with walking and/or transfers;Help with stairs or ramp for entrance;Assist for transportation;Assistance with cooking/housework    Functional Status Assessment  Patient has had a recent decline in their functional status and demonstrates the ability to make significant improvements in function in a reasonable and  predictable amount of time.  Equipment Recommendations  BSC/3in1    Recommendations for Other Services       Precautions / Restrictions Precautions Precautions: Fall Restrictions Weight Bearing Restrictions: Yes LLE Weight Bearing: Non weight bearing      Mobility Bed Mobility Overal bed mobility: Modified Independent                  Transfers Overall transfer level: Needs assistance Equipment used: Rolling walker (2 wheels) Transfers: Sit to/from Stand, Bed to chair/wheelchair/BSC Sit to Stand: Mod assist     Step pivot transfers: Mod assist     General transfer comment: unable to maintain NWB - recommend lateral scoot      Balance Overall balance assessment: Needs assistance Sitting-balance support: Feet supported Sitting balance-Leahy Scale: Good     Standing balance support: Reliant on assistive device for balance Standing balance-Leahy Scale: Poor                             ADL either performed or assessed with clinical judgement   ADL Overall ADL's : Needs assistance/impaired Eating/Feeding: Independent;Bed level   Grooming: Wash/dry hands;Wash/dry face;Set up;Sitting   Upper Body Bathing: Set up;Sitting   Lower Body Bathing: Minimal assistance;Sitting/lateral leans   Upper Body Dressing : Set up;Sitting   Lower Body Dressing: Minimal assistance;Moderate assistance;Sitting/lateral leans   Toilet Transfer: Moderate assistance;Stand-pivot;BSC/3in1 Toilet Transfer Details (indicate cue type and reason): recommend lateral scoot to drop arm                 Vision Baseline  Vision/History: 1 Wears glasses Patient Visual Report: No change from baseline       Perception Perception Perception: Not tested   Praxis Praxis Praxis: Not tested    Pertinent Vitals/Pain Pain Assessment Pain Assessment: Faces Faces Pain Scale: Hurts little more Pain Location: L foot Pain Descriptors / Indicators: Aching, Jabbing Pain  Intervention(s): Monitored during session, Patient requesting pain meds-RN notified     Hand Dominance Right   Extremity/Trunk Assessment Upper Extremity Assessment Upper Extremity Assessment: Overall WFL for tasks assessed   Lower Extremity Assessment Lower Extremity Assessment: Defer to PT evaluation   Cervical / Trunk Assessment Cervical / Trunk Assessment: Kyphotic   Communication Communication Communication: No difficulties   Cognition Arousal/Alertness: Awake/alert Behavior During Therapy: WFL for tasks assessed/performed Overall Cognitive Status: History of cognitive impairments - at baseline Area of Impairment: Attention, Following commands, Safety/judgement, Problem solving                   Current Attention Level: Selective   Following Commands: Follows one step commands with increased time Safety/Judgement: Decreased awareness of safety   Problem Solving: Slow processing, Requires verbal cues       General Comments   HR to 123 with transfer    Exercises     Shoulder Instructions      Home Living Family/patient expects to be discharged to:: Private residence Living Arrangements: Alone   Type of Home: Apartment Home Access: Level entry     Home Layout: One level     Bathroom Shower/Tub: Teacher, early years/pre: Standard Bathroom Accessibility: Yes How Accessible: Accessible via walker Home Equipment: Rankin (2 wheels);Cane - single point   Additional Comments: Sits on the edge of the bathtub to shower/bathe      Prior Functioning/Environment Prior Level of Function : Independent/Modified Independent;Driving             Mobility Comments: Walking without AD ADLs Comments: typically does microwave meals, sitting on the edge of bathtub to shower.  Continues to drive.        OT Problem List: Decreased strength;Impaired balance (sitting and/or standing);Decreased knowledge of precautions;Decreased knowledge of  use of DME or AE;Decreased safety awareness;Decreased cognition;Pain;Increased edema      OT Treatment/Interventions: Self-care/ADL training;Therapeutic activities;Patient/family education;Balance training;DME and/or AE instruction    OT Goals(Current goals can be found in the care plan section) Acute Rehab OT Goals Patient Stated Goal: Patient states he is moving to [place], closer to his sister. OT Goal Formulation: With patient Time For Goal Achievement: 09/10/21 Potential to Achieve Goals: Good ADL Goals Pt Will Perform Lower Body Dressing: with min guard assist;sit to/from stand;sitting/lateral leans Pt Will Transfer to Toilet: with modified independence;stand pivot transfer;bedside commode  OT Frequency: Min 2X/week    Co-evaluation              AM-PAC OT "6 Clicks" Daily Activity     Outcome Measure Help from another person eating meals?: None   Help from another person toileting, which includes using toliet, bedpan, or urinal?: A Lot Help from another person bathing (including washing, rinsing, drying)?: A Little Help from another person to put on and taking off regular upper body clothing?: None Help from another person to put on and taking off regular lower body clothing?: A Lot 6 Click Score: 15   End of Session Equipment Utilized During Treatment: Gait belt;Rolling walker (2 wheels) Nurse Communication: Mobility status  Activity Tolerance: Patient tolerated treatment well Patient left: in chair;with  call bell/phone within reach;with chair alarm set  OT Visit Diagnosis: Unsteadiness on feet (R26.81);Muscle weakness (generalized) (M62.81);Other symptoms and signs involving cognitive function;Pain Pain - Right/Left: Left Pain - part of body: Ankle and joints of foot                Time: 1164-3539 OT Time Calculation (min): 20 min Charges:  OT General Charges $OT Visit: 1 Visit OT Evaluation $OT Eval Moderate Complexity: 1 Mod  08/26/2021  RP, OTR/L  Acute  Rehabilitation Services  Office:  [phone]   Metta Clines 08/26/2021, 5:19 PM

## 2021-08-26 NOTE — Inpatient Diabetes Management (Addendum)
Inpatient Diabetes Program Recommendations  AACE/ADA: New Consensus Statement on Inpatient Glycemic Control (2015)  Target Ranges:  Prepandial:   less than 140 mg/dL      Peak postprandial:   less than 180 mg/dL (1-2 hours)      Critically ill patients:  140 - 180 mg/dL   Lab Results  Component Value Date   GLUCAP 273 (H) 08/26/2021   HGBA1C 10.7 (H) 08/25/2021    Review of Glycemic Control  Latest Reference Range & Units 08/25/21 13:59 08/25/21 16:01 08/25/21 17:14 08/25/21 19:23 08/26/21 04:19 08/26/21 07:37 08/26/21 08:57  Glucose-Capillary 70 - 99 mg/dL 433 (H) 309 (H) 294 (H) 379 (H) 287 (H) 327 (H) 273 (H)   Diabetes history: DM2 Outpatient Diabetes medications: Metformin 1000 mg BID Current orders for Inpatient glycemic control: Novolog 0-9 units tid Levemir 3 units qhs  A1c 10.7% on 6/27  Inpatient Diabetes Program Recommendations:    -  Increasing Levemir to 12 units  Pt would benefit from insulin at time of d/c.  [facility] insurance, should be able to get insulin pens if pt is agreeable. Pt having cognitive decline concerns expressed over him living alone. May move with sister to [place].  Pt to go to OR today. Will follow for inpatient recs and disposition at time of d/c.  Spoke with pt at bedside regarding A1c and glucose control at home. Pt reports he is 80% sure that he will go to be with his sister in [place]. Pt reports going to a PCP at the [facility] maybe twice a year. He has been on metformin for years with no changes. Pt was never given a meter and has never been told to monitor his glucose levels at home. Told pt was will monitor trends in the hospital and determine if he will need insulin at time of d/c. Or can be managed with a pill. However, he will still need to check glucose levels 1-2 times a day. Pt agreeable at this time. May include sister in teaching closer to d/c.    Thank you, Tama Headings RN, MSN, BC-ADM Inpatient Diabetes Coordinator Team Pager [phone]  (8a-5p)

## 2021-08-26 NOTE — Anesthesia Preprocedure Evaluation (Addendum)
Anesthesia Evaluation  Patient identified by MRN, date of birth, ID band Patient awake    Reviewed: Allergy & Precautions, NPO status , Patient's Chart, lab work & pertinent test results  History of Anesthesia Complications Negative for: history of anesthetic complications  Airway Mallampati: II  TM Distance: >3 FB Neck ROM: Full    Dental  (+) Dental Advisory Given, Edentulous Upper, Edentulous Lower   Pulmonary Current Smoker and Patient abstained from smoking.,    Pulmonary exam normal        Cardiovascular hypertension, + DVT  + dysrhythmias Atrial Fibrillation  Rhythm:Irregular Rate:Tachycardia   '20 TTE - EF 60 to 65%. There is severely increased left ventricular hypertrophy. The right ventricular size is mildly enlarged. 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    Neuro/Psych PSYCHIATRIC DISORDERS Depression negative neurological ROS     GI/Hepatic negative GI ROS, Neg liver ROS,   Endo/Other  diabetes, Type 2, Oral Hypoglycemic Agents Na 129 Ca 8.4 Cl 96   Renal/GU negative Renal ROS     Musculoskeletal  Osteomyelitis     Abdominal   Peds  Hematology  On eliquis    Anesthesia Other Findings   Reproductive/Obstetrics                            Anesthesia Physical Anesthesia Plan  ASA: 4  Anesthesia Plan: General   Post-op Pain Management: Tylenol PO (pre-op)*   Induction: Intravenous  PONV Risk Score and Plan: 2 and Treatment may vary due to age or medical condition, Ondansetron and Midazolam  Airway Management Planned: LMA  Additional Equipment: None  Intra-op Plan:   Post-operative Plan: Extubation in OR  Informed Consent: I have reviewed the patients History and Physical, chart, labs and discussed the procedure including the risks, benefits and alternatives for the proposed anesthesia with the patient or authorized representative who has  indicated his/her understanding and acceptance.     Dental advisory given  Plan Discussed with: CRNA and Anesthesiologist  Anesthesia Plan Comments:        Anesthesia Quick Evaluation

## 2021-08-26 NOTE — Plan of Care (Signed)
  Problem: Education: Goal: Knowledge of disease or condition will improve Outcome: Not Progressing Goal: Understanding of medication regimen will improve Outcome: Not Progressing Goal: Individualized Educational Video(s) Outcome: Not Progressing   Problem: Activity: Goal: Ability to tolerate increased activity will improve Outcome: Not Progressing   Problem: Cardiac: Goal: Ability to achieve and maintain adequate cardiopulmonary perfusion will improve Outcome: Not Progressing   Problem: Health Behavior/Discharge Planning: Goal: Ability to safely manage health-related needs after discharge will improve Outcome: Not Progressing   Problem: Clinical Measurements: Goal: Ability to maintain clinical measurements within normal limits will improve Outcome: Not Progressing Goal: Will remain free from infection Outcome: Not Progressing Goal: Diagnostic test results will improve Outcome: Not Progressing Goal: Respiratory complications will improve Outcome: Not Progressing Goal: Cardiovascular complication will be avoided Outcome: Not Progressing   Problem: Pain Managment: Goal: General experience of comfort will improve Outcome: Not Progressing   Problem: Safety: Goal: Ability to remain free from injury will improve Outcome: Not Progressing   Problem: Elimination: Goal: Will not experience complications related to bowel motility Outcome: Not Progressing Goal: Will not experience complications related to urinary retention Outcome: Not Progressing

## 2021-08-26 NOTE — Anesthesia Postprocedure Evaluation (Signed)
Anesthesia Post Note  Patient: Randall Coleman  Procedure(s) Performed: Left Partial Foot Amputation (Left: Foot)     Patient location during evaluation: PACU Anesthesia Type: General Level of consciousness: awake and alert Pain management: pain level controlled Vital Signs Assessment: post-procedure vital signs reviewed and stable Respiratory status: spontaneous breathing, nonlabored ventilation, respiratory function stable and patient connected to nasal cannula oxygen Cardiovascular status: blood pressure returned to baseline, stable and tachycardic Postop Assessment: no apparent nausea or vomiting Anesthetic complications: no   No notable events documented.  Last Vitals:  Vitals:   08/26/21 0915 08/26/21 0930  BP: (!) 141/99 105/86  Pulse: (!) 105   Resp: (!) 26 19  Temp:    SpO2: 94%     Last Pain:  Vitals:   08/26/21 0900  TempSrc:   PainSc: 0-No pain                 Audry Pili

## 2021-08-26 NOTE — Consult Note (Signed)
ORTHOPAEDIC CONSULTATION  REQUESTING PHYSICIAN: Pokhrel, Laxman, MD  Chief Complaint: Abscess left foot  HPI: Randall Coleman is a 71 y.o. male who presents with necrotic abscess left foot progressively worsening with redness extending up the foot and leg.  Patient is status post great toe and second toe amputation.  Patient has uncontrolled type 2 diabetes.  Past Medical History:  Diagnosis Date   Diabetes mellitus without complication ([place])    DVT (deep venous thrombosis) ([place])    Hypertension    Past Surgical History:  Procedure Laterality Date   AMPUTATION Left 07/30/2020   Procedure: AMPUTATION LEFT GREAT TOE;  Surgeon: [name], MD;  Location: [facility];  Service: Orthopedics;  Laterality: Left;   AMPUTATION Left 04/08/2021   Procedure: AMPUTATION LEFT 2ND TOE;  Surgeon: [name], MD;  Location: [facility];  Service: Orthopedics;  Laterality: Left;   HERNIA REPAIR     HERNIA REPAIR     IR CHEST FLUORO  12/13/2018   Social History   Socioeconomic History   Marital status: Single    Spouse name: Not on file   Number of children: Not on file   Years of education: Not on file   Highest education level: Not on file  Occupational History   Occupation: retired  Tobacco Use   Smoking status: Every Day    Packs/day: 1.00    Years: 54.00    Total pack years: 54.00    Types: Cigarettes   Smokeless tobacco: Never  Substance and Sexual Activity   Alcohol use: Not Currently    Comment: h/o heavy use, quit about 10 years ago   Drug use: Never    Comment: used when he was younger   Sexual activity: Not Currently  Other Topics Concern   Not on file  Social History Narrative   Not on file   Social Determinants of Health   Financial Resource Strain: Not on file  Food Insecurity: Not on file  Transportation Needs: Not on file  Physical Activity: Not on file  Stress: Not on file  Social Connections: Not on file   History reviewed. No pertinent family history. -  negative except otherwise stated in the family history section No Known Allergies Prior to Admission medications   Medication Sig Start Date End Date Taking? Authorizing Provider  acetaminophen (TYLENOL) 500 MG tablet Take 2 tablets (1,000 mg total) by mouth every 8 (eight) hours. Patient taking differently: Take 1,000 mg by mouth every 6 (six) hours as needed for moderate pain. 04/10/21  Yes Lajean Manes, MD  apixaban (ELIQUIS) 5 MG TABS tablet Take 5 mg by mouth 2 (two) times daily.   Yes [provider]  bisacodyl (DULCOLAX) 5 MG EC tablet Take 1 tablet (5 mg total) by mouth daily as needed for moderate constipation. 04/10/21  Yes Lajean Manes, MD  metFORMIN (GLUCOPHAGE) 1000 MG tablet Take 1,000 mg by mouth 2 (two) times daily after a meal.   Yes [provider]  Multiple Vitamin (MULTIVITAMIN WITH MINERALS) TABS tablet Take 1 tablet by mouth every morning.   Yes [provider]  docusate sodium (COLACE) 100 MG capsule Take 1 capsule (100 mg total) by mouth daily. Patient not taking: Reported on 08/10/2021 04/10/21   Lajean Manes, MD  polyethylene glycol powder (GLYCOLAX/MIRALAX) 17 GM/SCOOP powder Take 17 g by mouth daily as needed for mild constipation. Patient not taking: Reported on 08/10/2021 04/10/21   Lajean Manes, MD   DG Foot 2 Views Left  Result Date: 08/25/2021 CLINICAL DATA:  Surgery on foot tomorrow for toe removal.  Pain. EXAM: LEFT FOOT - 2 VIEW COMPARISON:  08/07/2021 FINDINGS: Prior resection of the first and second phalanges. Soft tissue swelling and minimal soft tissue gas about the medial forefoot. Ill definition of the second distal metatarsal is relatively similar. There is osseous irregularity involving the distal third phalanx with increased fragmentation. The middle phalanx of the third digit demonstrates osteolysis, new. Small calcaneal spur. IMPRESSION: Findings suspicious for osteomyelitis involving the distal third digit, progressive. Cannot  exclude osteomyelitis involving the distal portion of the second metatarsal. Electronically Signed   By: Abigail Miyamoto M.D.   On: 08/25/2021 12:07   DG Chest Port 1 View  Result Date: 08/25/2021 CLINICAL DATA:  Sepsis. EXAM: PORTABLE CHEST 1 VIEW COMPARISON:  04/18/2021 FINDINGS: The heart size and mediastinal contours are within normal limits. Both lungs are clear. IMPRESSION: No active disease. Electronically Signed   By: Marlaine Hind M.D.   On: 08/25/2021 12:03   - pertinent xrays, CT, MRI studies were reviewed and independently interpreted  Positive ROS: All other systems have been reviewed and were otherwise negative with the exception of those mentioned in the HPI and as above.  Physical Exam: General: Alert, no acute distress Psychiatric: Patient is competent for consent with normal mood and affect Lymphatic: No axillary or cervical lymphadenopathy Cardiovascular: No pedal edema Respiratory: No cyanosis, no use of accessory musculature GI: No organomegaly, abdomen is soft and non-tender    Images:  '@ENCIMAGES'$ @  Labs:  Lab Results  Component Value Date   HGBA1C 10.7 (H) 08/25/2021   HGBA1C 7.9 (H) 04/06/2021   HGBA1C 8.0 (H) 07/29/2020   REPTSTATUS PENDING 08/25/2021   REPTSTATUS PENDING 08/25/2021   CULT  08/25/2021    NO GROWTH < 24 HOURS Performed at [facility], [place] [address] [place],  [address]    CULT  08/25/2021    NO GROWTH < 24 HOURS Performed at [facility] [address], [place],  [address]     Lab Results  Component Value Date   ALBUMIN 2.7 (L) 08/25/2021   ALBUMIN 4.2 08/07/2021   ALBUMIN 3.1 (L) 04/06/2021        Latest Ref Rng & Units 08/26/2021    6:57 AM 08/25/2021   11:39 AM 08/07/2021    1:46 PM  CBC EXTENDED  WBC 4.0 - 10.5 K/uL 10.8  14.0  7.8   RBC 4.22 - 5.81 MIL/uL 4.00  5.18  5.19   Hemoglobin 13.0 - 17.0 g/dL 11.4  14.3  15.0   HCT 39.0 - 52.0 % 33.3  43.7  43.3   Platelets 150 - 400 K/uL 215  292   238   NEUT# 1.7 - 7.7 K/uL  12.1  6.0   Lymph# 0.7 - 4.0 K/uL  0.7  1.0     Neurologic: Patient does not have protective sensation bilateral lower extremities.   MUSCULOSKELETAL:   Skin: Examination patient has necrotic changes to the toes of the left foot with cellulitis to the midfoot.  Patient's white blood cell count is 10.8 down from 14.  Albumin 2.7.  Hemoglobin A1c 10.7.  Assessment: Assessment: Uncontrolled type 2 diabetes with protein caloric malnutrition with abscess and osteomyelitis left forefoot.  Plan: We will plan for midfoot amputation on the left.  Discussed with the patient he may require a below-knee amputation but will proceed with foot salvage intervention at this time.  Future surgery  possibly on Friday.  Thank you for the consult and the opportunity to see Mr. Randall Coleman, [place] [phone] 8:08 AM

## 2021-08-26 NOTE — Op Note (Signed)
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 male with a diagnosis of Osteomyelitis Left 3rd Toe who failed conservative measures and elected for surgical management.    The risks benefits and alternatives were discussed with the patient preoperatively including but not limited to the risks of infection, bleeding, nerve injury, cardiopulmonary complications, the need for revision surgery, among others, and the patient was willing to proceed.  OPERATIVE IMPLANTS: Wound packed open with Kerlix  '@ENCIMAGES'$ @  OPERATIVE FINDINGS: Abscess extending through the hindfoot.  OPERATIVE PROCEDURE: Patient was brought the operating room and underwent general anesthetic.  After adequate levels anesthesia were obtained patient's left lower extremity was prepped using DuraPrep draped into a sterile field a timeout was called.  A fishmouth incision was made proximal to the ulcerative tissue this was carried down to bone and oscillating saw was used to resect the bone through a transmetatarsal amputation.  Patient had abscess and necrotic tissue on the plantar aspect this was resected out and required resection of the entire plantar aspect of the foot with abscess extending past the calcaneus.  The wound was irrigated with normal saline the abscess was decompressed.  The wound was packed open with Kerlix a sterile dressing was applied patient was extubated taken the PACU in stable condition.   DISCHARGE PLANNING:  Antibiotic duration: Continue IV antibiotics  Weightbearing: Nonweightbearing on the left  Pain medication: Opioid pathway  Dressing care/ Wound VAC: Reinforce dressing as needed  Ambulatory devices: Walker   Patient  will need return to the operating room on Friday for a below-knee amputation.  Patient does not have foot salvage intervention options.

## 2021-08-26 NOTE — Hospital Course (Addendum)
Randall Coleman is a 71 y.o. male with medical history of type 2 diabetes, hypertension, history of DVT on Eliquis presented to hospital with left foot infection.  Patient endorsed having pain swelling and redness of the foot and had foul-smelling discharge and ambulatory dysfunction.  In the ED patient was afebrile with mild leukocytosis and hyponatremia.  Lactate was elevated at 2.0.  Chest x-ray without any obvious infiltrate.  X-ray of the foot showed findings suggestive of osteomyelitis involving the distal third digit.  Patient was given 2 L of IV fluids, vancomycin and cefepime and Dr. Sharol Given was notified.  Patient was then admitted hospital for further evaluation and treatment.    During hospitalization, patient was seen by orthopedics and underwent transmetatarsal amputation on 08/26/2021 with findings of abscess in the hindfoot.  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  Continue vanc/cefepime, flagyl.  ABI of the left lower extremity was normal on 08/06/2020.  Orthopedic recommended nonweightbearing on the left with IV antibiotics.  Plan for below-knee amputation 08/28/2021 but since the patient had eaten food has been postponed.  Paroxysmal Atrial fibrillation with RVR (HCC) Controlled at this time.  Initially needed Cardizem drip.  Now on oral Cardizem.  On heparin drip.   On Eliquis as outpatient.  2D echocardiogram with LV ejection  fraction of 40 to 45% with global hypokinesis.   Continue metoprolol.  Cardiology following.  DKA, type 2  Likely secondary to sepsis from left foot osteomyelitis.  Received IV fluids and insulin while in the hospital.  Latest hemoglobin A1c of 7.9 in 04/2021.  Repeat hemoglobin A1c at 10.7 on 08/25/2021.  Continue sliding scale and long-acting insulin at this time.  Latest POC glucose of 278.   History of DVT of lower extremity On eliquis as outpatient.  Currently on heparin drip     Essential hypertension Not on medications at home.  Currently on Cardizem and metoprolol.  Blood pressure is stable.  History of cigarette smoking.  On nicotine patch.  Mild hyponatremia.  We will continue to monitor closely.  Latest sodium of 133  Hypomagnesemia.  Replenished.  Magnesium level of 1.9  Nutrition Status:Body mass index is 23.75 kg/m.  Present on admission. Nutrition Problem: Severe Malnutrition Etiology: chronic illness (uncontrolled DM with A1C=10.7) Signs/Symptoms: severe muscle depletion, severe fat depletion Interventions: Ensure Enlive (each supplement provides 350kcal and 20 grams of protein), MVI, Juven

## 2021-08-27 ENCOUNTER — Encounter (HOSPITAL_COMMUNITY): Payer: Self-pay | Admitting: Orthopedic Surgery

## 2021-08-27 DIAGNOSIS — E43 Unspecified severe protein-calorie malnutrition: Secondary | ICD-10-CM | POA: Insufficient documentation

## 2021-08-27 DIAGNOSIS — E871 Hypo-osmolality and hyponatremia: Secondary | ICD-10-CM

## 2021-08-27 LAB — BASIC METABOLIC PANEL
Anion gap: 6 (ref 5–15)
BUN: 18 mg/dL (ref 8–23)
CO2: 23 mmol/L (ref 22–32)
Calcium: 8.1 mg/dL — ABNORMAL LOW (ref 8.9–10.3)
Chloride: 101 mmol/L (ref 98–111)
Creatinine, Ser: 0.84 mg/dL (ref 0.61–1.24)
GFR, Estimated: >60 mL/min (ref >60)
Glucose, Bld: 331 mg/dL — ABNORMAL HIGH (ref 70–99)
Potassium: 4 mmol/L (ref 3.5–5.1)
Sodium: 130 mmol/L — ABNORMAL LOW (ref 135–145)

## 2021-08-27 LAB — CBC
HCT: 32.8 % — ABNORMAL LOW (ref 39.0–52.0)
Hemoglobin: 11.1 g/dL — ABNORMAL LOW (ref 13.0–17.0)
MCH: 28.1 pg (ref 26.0–34.0)
MCHC: 33.8 g/dL (ref 30.0–36.0)
MCV: 83 fL (ref 80.0–100.0)
Platelets: 240 10*3/uL (ref 150–400)
RBC: 3.95 MIL/uL — ABNORMAL LOW (ref 4.22–5.81)
RDW: 12.4 % (ref 11.5–15.5)
WBC: 7.6 10*3/uL (ref 4.0–10.5)
nRBC: 0 % (ref 0.0–0.2)

## 2021-08-27 LAB — HEPARIN LEVEL (UNFRACTIONATED)
Heparin Unfractionated: 0.14 IU/mL — ABNORMAL LOW (ref 0.30–0.70)
Heparin Unfractionated: 0.32 IU/mL (ref 0.30–0.70)
Heparin Unfractionated: 0.22 IU/mL — ABNORMAL LOW (ref 0.30–0.70)

## 2021-08-27 LAB — GLUCOSE, CAPILLARY
Glucose-Capillary: 323 mg/dL — ABNORMAL HIGH (ref 70–99)
Glucose-Capillary: 236 mg/dL — ABNORMAL HIGH (ref 70–99)
Glucose-Capillary: 227 mg/dL — ABNORMAL HIGH (ref 70–99)
Glucose-Capillary: 134 mg/dL — ABNORMAL HIGH (ref 70–99)

## 2021-08-27 LAB — MAGNESIUM: Magnesium: 1.6 mg/dL — ABNORMAL LOW (ref 1.7–2.4)

## 2021-08-27 MED ORDER — MAGNESIUM SULFATE 2 GM/50ML IV SOLN
2.0000 g | Freq: Once | INTRAVENOUS | Status: AC
  Administered 2021-08-27: 2 g via INTRAVENOUS
  Filled 2021-08-27: qty 50

## 2021-08-27 MED ORDER — MAGNESIUM OXIDE -MG SUPPLEMENT 400 (240 MG) MG PO TABS
400.0000 mg | ORAL_TABLET | Freq: Two times a day (BID) | ORAL | Status: DC
  Administered 2021-08-27 – 2021-09-01 (×10): 400 mg via ORAL
  Filled 2021-08-27 (×10): qty 1

## 2021-08-27 MED ORDER — GLUCERNA SHAKE PO LIQD
237.0000 mL | Freq: Three times a day (TID) | ORAL | Status: DC
  Administered 2021-08-27 – 2021-09-01 (×16): 237 mL via ORAL

## 2021-08-27 NOTE — Progress Notes (Addendum)
NCM called [facility] notification hotline to inform of current hospital stay. Authorization # received: [id number]. Active with [facility]. PCP: Landry Mellow, [place], [place], ext. [phone]. Whitman Hero RN,BSN,CM

## 2021-08-27 NOTE — Progress Notes (Addendum)
PROGRESS NOTE    Randall Coleman  ALP:[id number] DOB: [birth year] DOA: 08/25/2021 PCP: Merryl Hacker, No    Brief Narrative:  Randall Coleman is a 71 y.o. male with medical history of type 2 diabetes, hypertension, history of DVT on Eliquis presented to hospital with left foot infection.  Patient endorsed having pain swelling and redness of the foot and had foul-smelling discharge and ambulatory dysfunction.  In the ED patient was afebrile with mild leukocytosis and hyponatremia.  Lactate was elevated at 2.0.  Chest x-ray without any obvious infiltrate.  X-ray of the foot showed findings suggestive of osteomyelitis involving the distal third digit.  Patient was given 2 L of IV fluids, vancomycin and cefepime and Dr. Sharol Given was notified.  Patient was then admitted hospital for further evaluation and treatment.    During hospitalization, patient was seen by orthopedics and underwent transmetatarsal amputation on 08/26/2021 with findings of abscess in the hindfoot.  At this time, patient is on IV antibiotic and the plan is to follow-up 08/28/21 to see if he would need below-knee amputation.  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  Continue vanc/cefepime, flagyl.  ABI of the left lower extremity was normal on 08/06/2020.  Orthopedic recommended nonweightbearing on the left with IV antibiotics.  As per orthopedics might need a below-knee amputation on Friday.  We will follow orthopedic recommendations.   Paroxysmal Atrial fibrillation with RVR  ([place]) On presentation.  Required Cardizem drip initially.  Heart rate has improved after Cardizem drip, has been transitioned to oral Cardizem at this time..  Continue heparin drip for need for possible further surgical intervention.  On Eliquis as outpatient.  2D echocardiogram with LV ejection fraction of 40 to 45% with global hypokinesis.   Continue metoprolol.  Follow cardiology recommendation.  DKA, type 2  Likely secondary to sepsis from left foot osteomyelitis.  Received IV fluids and insulin while in the hospital.  Latest hemoglobin A1c of 7.9 in 04/2021.  Repeat hemoglobin A1c at 10.7 on 08/25/2021.  We will continue to monitor closely.  Continue sliding scale and long-acting insulin at this time.   History of DVT of lower extremity On eliquis as outpatient.  Currently on heparin drip     Essential hypertension Not on medications at home.  Currently on Cardizem and metoprolol.  Blood pressure of 121/71  History of cigarette smoking.  On nicotine patch.  Mild hyponatremia.  We will continue to monitor closely.  Hypomagnesemia.  Will replace.  Check levels in AM.  Nutrition Status:Body mass index is 23.75 kg/m.  Present on admission. Nutrition Problem: Severe Malnutrition Etiology: chronic illness (uncontrolled DM with A1C=10.7) Signs/Symptoms: severe muscle depletion, severe fat depletion Interventions: Ensure Enlive (each supplement provides 350kcal and 20 grams of protein), MVI, Juven     DVT prophylaxis: SCD's Start: 08/26/21 1056   Code Status:     Code Status: Full Code  Disposition: Home with home health/CIR.  PT evaluation pending.  Status is: Inpatient  Remains inpatient appropriate because: Status post transmetatarsal amputation, IV antibiotic, possible  need for further intervention.   Family Communication:  Communicated with the patient's sister at bedside on 08/26/2021.  Consultants:  Orthopedics Dr Sharol Given.  Procedures:  Left transmetatarsal amputation on  08/26/2021.  Antimicrobials:  Vancomycin, Flagyl, cefepime  Anti-infectives (From admission, onward)    Start     Dose/Rate Route Frequency Ordered Stop   08/26/21 0200  vancomycin (VANCOCIN) IVPB 1000 mg/200 mL premix        1,000 mg 200 mL/hr over 60 Minutes Intravenous Every 12 hours 08/25/21 1225     08/25/21 1430  metroNIDAZOLE (FLAGYL) IVPB 500 mg        500 mg 100 mL/hr over 60 Minutes Intravenous Every 12 hours 08/25/21 1422     08/25/21 1300  ceFEPIme (MAXIPIME) 2 g in sodium chloride 0.9 % 100 mL IVPB        2 g 200 mL/hr over 30 Minutes Intravenous Every 8 hours 08/25/21 1220     08/25/21 1230  vancomycin (VANCOREADY) IVPB 2000 mg/400 mL        2,000 mg 200 mL/hr over 120 Minutes Intravenous  Once 08/25/21 1220 08/25/21 1554      Subjective: Today, patient was seen and examined at bedside.  Status post transmetatarsal amputation.  Complains of mild foot pain.  No fever chills shortness of breath.    Objective: Vitals:   08/26/21 2328 08/27/21 0344 08/27/21 0800 08/27/21 1200  BP: 101/67 92/78 121/71 110/65  Pulse: 99 90 93 93  Resp: (!) 22 (!) '24 19 17  '$ Temp: 98 F (36.7 C) 98 F (36.7 C) 98.2 F (36.8 C) 98.3 F (36.8 C)  TempSrc: Oral Oral Oral Oral  SpO2: 95% 95% 97% 93%  Weight:      Height:        Intake/Output Summary (Last 24 hours) at 08/27/2021 1420 Last data filed at 08/27/2021 0800 Gross per 24 hour  Intake 965.89 ml  Output 400 ml  Net 565.89 ml   Filed Weights   08/25/21 1438  Weight: 83.9 kg    Physical Examination: Body mass index is 23.75 kg/m.   General:  Average built, not in obvious distress HENT:   No scleral pallor or icterus noted. Oral mucosa is moist.  Chest:  Clear breath sounds.  Diminished breath sounds bilaterally. No crackles or wheezes.  CVS: S1 &S2 heard. No murmur.  Regular rate and rhythm. Abdomen: Soft, nontender, nondistended.  Bowel sounds are heard.   Extremities: No cyanosis, clubbing or edema.  Peripheral  pulses are palpable.  Status left transmetatarsal amputation with dressing. Psych: Alert, awake and oriented, normal mood CNS:  No cranial nerve deficits.  Skin: Warm and dry.  Status post left foot surgery.  Data Reviewed:   CBC: Recent Labs  Lab 08/25/21 1139 08/26/21 0657 08/26/21 1432 08/27/21 0401  WBC 14.0* 10.8* 10.0 7.6  NEUTROABS 12.1*  --   --   --   HGB 14.3 11.4* 11.0* 11.1*  HCT 43.7 33.3* 31.9* 32.8*  MCV 84.4 83.3 82.2 83.0  PLT 292 215 250 062    Basic Metabolic Panel: Recent Labs  Lab 08/25/21 1139 08/25/21 1422 08/25/21 1656 08/25/21 1755 08/27/21 0401  NA 129* 129* 131* 129* 130*  K 4.6 4.7 4.3 4.4 4.0  CL 92* 93* 96* 96* 101  CO2 21* 17* 22 21* 23  GLUCOSE 413* 383* 298* 287* 331*  BUN 28* 27* 24* 25* 18  CREATININE 1.14 0.98 0.80 0.74 0.84  CALCIUM 8.8* 8.6* 8.3* 8.4* 8.1*  MG  --  1.8  --   --  1.6*    Liver Function Tests: Recent Labs  Lab 08/25/21 1139  AST 19  ALT 20  ALKPHOS 106  BILITOT 1.2  PROT 6.8  ALBUMIN 2.7*     Radiology Studies: ECHOCARDIOGRAM COMPLETE  Result Date: 08/26/2021    ECHOCARDIOGRAM REPORT   Patient Name:   BHAVIK CABINESS Date of Exam: 08/26/2021 Medical Rec #:  [id number]      Height:       74.0 in Accession #:    [id number]     Weight:       185.0 lb Date of Birth:  07-17-1950       BSA:          2.102 m Patient Age:    36 years       BP:           91/62 mmHg Patient Gender: M              HR:           90 bpm. Exam Location:  Inpatient Procedure: 2D Echo, Cardiac Doppler and Color Doppler Indications:    Atrial Fibrillation  History:        Patient has prior history of Echocardiogram examinations, most                 recent 12/10/2018. Arrythmias:Atrial Fibrillation; Risk                 Factors:Hypertension and Diabetes.  Sonographer:    Jefferey Pica Referring Phys: [id number] [place]  1. Left ventricular ejection fraction, by estimation, is 40 to 45%. The left ventricle has mildly decreased  function. The left ventricle demonstrates global hypokinesis. There is mild left ventricular hypertrophy. Left ventricular diastolic parameters are indeterminate.  2. Right ventricular systolic function is normal. The right ventricular size is mildly enlarged. There is mildly elevated pulmonary artery systolic pressure.  3. Left atrial size was moderately dilated.  4. Right atrial size was mildly dilated.  5. The mitral valve is normal in structure. Trivial mitral valve regurgitation. No evidence of mitral stenosis.  6. The aortic valve is normal in structure. Aortic valve regurgitation is not visualized. No aortic stenosis is present.  7. The inferior vena cava is normal in size with greater than 50% respiratory variability, suggesting right atrial pressure of 3 mmHg. Comparison(s): The left ventricular function is worsened. FINDINGS  Left Ventricle: Left ventricular ejection fraction, by estimation, is 40 to 45%. The left ventricle has mildly decreased function. The left ventricle demonstrates global hypokinesis. The left ventricular internal cavity size was normal in size. There is  mild left ventricular hypertrophy. Left ventricular diastolic parameters are indeterminate. Right Ventricle: The right ventricular size is mildly enlarged. No increase in right ventricular wall thickness. Right ventricular systolic function is normal. There is mildly elevated pulmonary artery systolic pressure. The tricuspid regurgitant velocity is 2.36 m/s, and with an assumed right atrial pressure of 15 mmHg, the estimated right ventricular systolic pressure is 71.0 mmHg. Left Atrium: Left atrial size was moderately dilated. Right Atrium: Right atrial size was mildly dilated. Pericardium: There is no evidence of pericardial effusion. Mitral Valve: The mitral valve is normal in structure. Trivial mitral valve regurgitation. No evidence of mitral valve stenosis. Tricuspid Valve: The tricuspid valve is normal in structure. Tricuspid  valve regurgitation is mild . No evidence of tricuspid stenosis. Aortic Valve: The aortic valve is normal in structure. Aortic  valve regurgitation is not visualized. No aortic stenosis is present. Aortic valve peak gradient measures 4.3 mmHg. Pulmonic Valve: The pulmonic valve was normal in structure. Pulmonic valve regurgitation is not visualized. No evidence of pulmonic stenosis. Aorta: The aortic root is normal in size and structure. Venous: The inferior vena cava is normal in size with greater than 50% respiratory variability, suggesting right atrial pressure of 3 mmHg. IAS/Shunts: No atrial level shunt detected by color flow Doppler.  LEFT VENTRICLE PLAX 2D LVIDd:         3.90 cm LVIDs:         3.10 cm LV PW:         1.20 cm LV IVS:        1.20 cm LVOT diam:     2.40 cm LV SV:         70 LV SV Index:   33 LVOT Area:     4.52 cm  RIGHT VENTRICLE          IVC RV Basal diam:  3.60 cm  IVC diam: 2.90 cm RV Mid diam:    4.10 cm LEFT ATRIUM              Index        RIGHT ATRIUM           Index LA diam:        4.60 cm  2.19 cm/m   RA Area:     23.60 cm LA Vol (A2C):   96.9 ml  46.09 ml/m  RA Volume:   76.50 ml  36.39 ml/m LA Vol (A4C):   103.0 ml 48.99 ml/m LA Biplane Vol: 100.0 ml 47.56 ml/m  AORTIC VALVE                 PULMONIC VALVE AV Area (Vmax): 3.69 cm     PV Vmax:       0.75 m/s AV Vmax:        103.16 cm/s  PV Peak grad:  2.3 mmHg AV Peak Grad:   4.3 mmHg LVOT Vmax:      84.20 cm/s LVOT Vmean:     55.925 cm/s LVOT VTI:       0.154 m  AORTA Ao Root diam: 3.70 cm Ao Asc diam:  3.40 cm TRICUSPID VALVE TR Peak grad:   22.3 mmHg TR Vmax:        236.00 cm/s  SHUNTS Systemic VTI:  0.15 m Systemic Diam: 2.40 cm Candee Furbish MD Electronically signed by Candee Furbish MD Signature Date/Time: 08/26/2021/12:52:09 PM    Final       LOS: 2 days    Flora Lipps, MD [facility] Available via Epic secure chat 7am-7pm After these hours, please refer to coverage provider listed on [email or web address] 08/27/2021,  2:20 PM

## 2021-08-27 NOTE — Progress Notes (Signed)
Patient ID: Randall Coleman, male   DOB: 01-10-1951, 71 y.o.   MRN: [id number] Patient is postoperative day 1 from transmetatarsal amputation left foot.  Patient's abscess and necrotic tissue extended to the hindfoot.  Patient does not have foot salvage options available and will proceed with a below-knee amputation on the left tomorrow, Friday.  Anticipate discharge to skilled nursing.

## 2021-08-27 NOTE — Progress Notes (Signed)
ANTICOAGULATION CONSULT NOTE Pharmacy Consult for Heparin Indication: atrial fibrillation and hx of VTE Brief A/P: Heparin level subtherapeutic Increase Heparin rate  No Known Allergies  Patient Measurements: Height: '6\' 2"'$  (188 cm) Weight: 83.9 kg (184 lb 15.5 oz) IBW/kg (Calculated) : 82.2  Heparin Dosing Weight: 84 kg  Vital Signs: Temp: 98.3 F (36.8 C) (06/29 1929) Temp Source: Oral (06/29 1929) BP: 117/70 (06/29 1929) Pulse Rate: 95 (06/29 1929)  Labs: Recent Labs    08/25/21 1139 08/25/21 1422 08/25/21 1656 08/25/21 1755 08/25/21 2117 08/26/21 0657 08/26/21 1432 08/26/21 1648 08/27/21 0401 08/27/21 1258 08/27/21 2250  HGB 14.3  --   --   --   --  11.4* 11.0*  --  11.1*  --   --   HCT 43.7  --   --   --   --  33.3* 31.9*  --  32.8*  --   --   PLT 292  --   --   --   --  215 250  --  240  --   --   APTT 28  --   --   --  27  --   --   --   --   --   --   LABPROT 15.6*  --   --   --   --   --   --   --   --   --   --   INR 1.3*  --   --   --   --   --   --   --   --   --   --   HEPARINUNFRC  --    < >  --   --  <0.10* 0.11*  --    < > 0.14* 0.32 0.22*  CREATININE 1.14   < > 0.80 0.74  --   --   --   --  0.84  --   --    < > = values in this interval not displayed.     Estimated Creatinine Clearance: 96.5 mL/min (by C-G formula based on SCr of 0.84 mg/dL).   Assessment: 71 y.o. male with h/o DVT and Afib, Eliquis on hold, for heparin  Goal of Therapy:  Heparin level 0.3-0.7 units/mL Monitor platelets by anticoagulation protocol: Yes   Plan:  Increase Heparin 2000 units/hr F/U after OR tomorrow  Phillis Knack, PharmD, BCPS

## 2021-08-27 NOTE — Progress Notes (Signed)
ANTICOAGULATION CONSULT NOTE  Pharmacy Consult for Heparin Indication: atrial fibrillation and hx of VTE  No Known Allergies  Patient Measurements: Height: '6\' 2"'$  (188 cm) Weight: 83.9 kg (184 lb 15.5 oz) IBW/kg (Calculated) : 82.2  Heparin Dosing Weight: 84 kg  Vital Signs: Temp: 98 F (36.7 C) (06/29 0344) Temp Source: Oral (06/29 0344) BP: 92/78 (06/29 0344) Pulse Rate: 90 (06/29 0344)  Labs: Recent Labs    08/25/21 1139 08/25/21 1422 08/25/21 1515 08/25/21 1656 08/25/21 1755 08/25/21 2117 08/26/21 0657 08/26/21 1432 08/26/21 1648 08/27/21 0401  HGB 14.3  --   --   --   --   --  11.4* 11.0*  --  11.1*  HCT 43.7  --   --   --   --   --  33.3* 31.9*  --  32.8*  PLT 292  --   --   --   --   --  215 250  --  240  APTT 28  --   --   --   --  27  --   --   --   --   LABPROT 15.6*  --   --   --   --   --   --   --   --   --   INR 1.3*  --   --   --   --   --   --   --   --   --   HEPARINUNFRC  --   --    < >  --   --  <0.10* 0.11*  --  0.11* 0.14*  CREATININE 1.14 0.98  --  0.80 0.74  --   --   --   --   --    < > = values in this interval not displayed.     Estimated Creatinine Clearance: 101.3 mL/min (by C-G formula based on SCr of 0.74 mg/dL).   Assessment: 24 yom with a history of DVT and atrial fibrillation on Eliquis PTA (LD 6.26). Patient presented with pain, swelling and redness in his left foot. Heparin per pharmacy consult placed while Eliquis on hold.   Heparin level remains subtherapeutic (0.11) on heparin infusion at 1500 units/hr. S/p L metatarsal amputation today. RN notes that wound is bleeding but not copiously. RN states that the heparin gtt was leaking a bit earlier but they have redressed the heparin site and doesn't seem to be having any problems now.  6/29 AM update:  Heparin level remains low Hgb stable from yesterday  Goal of Therapy:  Heparin level 0.3-0.7 units/ml (aim for 0.3-0.5 since post amputation) Monitor platelets by  anticoagulation protocol: Yes   Plan:  Increase heparin infusion to 1800 units/hr Check heparin level in 6-8 hours  F/u closely for any increase in bleeding   Narda Bonds, PharmD, [place] Pharmacist Phone: [phone]

## 2021-08-27 NOTE — Progress Notes (Signed)
ANTICOAGULATION CONSULT NOTE  Pharmacy Consult for Heparin Indication: atrial fibrillation and hx of VTE  No Known Allergies  Patient Measurements: Height: '6\' 2"'$  (188 cm) Weight: 83.9 kg (184 lb 15.5 oz) IBW/kg (Calculated) : 82.2  Heparin Dosing Weight: 84 kg  Vital Signs: Temp: 98.3 F (36.8 C) (06/29 1200) Temp Source: Oral (06/29 1200) BP: 110/65 (06/29 1200) Pulse Rate: 93 (06/29 1200)  Labs: Recent Labs    08/25/21 1139 08/25/21 1422 08/25/21 1656 08/25/21 1755 08/25/21 2117 08/26/21 0657 08/26/21 1432 08/26/21 1648 08/27/21 0401 08/27/21 1258  HGB 14.3  --   --   --   --  11.4* 11.0*  --  11.1*  --   HCT 43.7  --   --   --   --  33.3* 31.9*  --  32.8*  --   PLT 292  --   --   --   --  215 250  --  240  --   APTT 28  --   --   --  27  --   --   --   --   --   LABPROT 15.6*  --   --   --   --   --   --   --   --   --   INR 1.3*  --   --   --   --   --   --   --   --   --   HEPARINUNFRC  --    < >  --   --  <0.10* 0.11*  --  0.11* 0.14* 0.32  CREATININE 1.14   < > 0.80 0.74  --   --   --   --  0.84  --    < > = values in this interval not displayed.    Estimated Creatinine Clearance: 96.5 mL/min (by C-G formula based on SCr of 0.84 mg/dL).   Assessment: 56 yom with a history of DVT and atrial fibrillation on Eliquis PTA (LD 6.26). Patient presented with pain, swelling and redness in his left foot. Heparin per pharmacy consult placed while Eliquis on hold.   Anticipated to have falsely high anti-Xa level secondary to DOAC use, however anti-Xa levels have remained undetectable since admission. Unsure if consistently taking DOAC PTA. Will DC aPTT monitoring and continue adjusting heparin levels based on anti-Xa instead.   Heparin level remains subtherapeutic (0.11) on heparin infusion at 1500 units/hr. S/p L metatarsal amputation today. Overnight, RN noted that wound was bleeding but not copiously. RN states that the heparin gtt was also leaking a bit earlier.  Currently no further heparin infusion issues or additional signs of bleeding. Heparin now therapeutic 0.32 at current rate of 1800 units/hr.    Goal of Therapy:  Heparin level 0.3-0.7 units/ml aPTT 66-102 seconds Monitor platelets by anticoagulation protocol: Yes   Plan:  Continue heparin infusion at 1800 units/hr Check heparin level in 8 hours and daily while on heparin Continue to monitor H&H and platelets    Thank you for allowing pharmacy to be a part of this patient's care.  Ardyth Harps, PharmD Clinical Pharmacist

## 2021-08-27 NOTE — Inpatient Diabetes Management (Signed)
Inpatient Diabetes Program Recommendations  AACE/ADA: New Consensus Statement on Inpatient Glycemic Control (2015)  Target Ranges:  Prepandial:   less than 140 mg/dL      Peak postprandial:   less than 180 mg/dL (1-2 hours)      Critically ill patients:  140 - 180 mg/dL   Lab Results  Component Value Date   GLUCAP 323 (H) 08/27/2021   HGBA1C 10.7 (H) 08/25/2021    Review of Glycemic Control  Latest Reference Range & Units 08/25/21 13:59 08/25/21 16:01 08/25/21 17:14 08/25/21 19:23 08/26/21 04:19 08/26/21 07:37 08/26/21 08:57  Glucose-Capillary 70 - 99 mg/dL 433 (H) 309 (H) 294 (H) 379 (H) 287 (H) 327 (H) 273 (H)   Diabetes history: DM2 Outpatient Diabetes medications: Metformin 1000 mg BID Current orders for Inpatient glycemic control: Novolog 0-9 units tid Levemir 3 units qhs  A1c 10.7% on 6/27  Inpatient Diabetes Program Recommendations:    -  Increasing Levemir to 12 units -  Increase Novolog Correction to 0-15 units tid + hs  Pt would benefit from insulin at time of d/c.  [facility], should be able to get insulin pens if pt is agreeable. Pt having cognitive decline concerns expressed over him living alone. May move with sister to [place].   Spoke with pt at bedside on 6/28   Thank you, Tama Headings RN, MSN, BC-ADM Inpatient Diabetes Coordinator Team Pager [phone] (8a-5p)

## 2021-08-27 NOTE — Evaluation (Signed)
Physical Therapy Evaluation Patient Details Name: Randall Coleman MRN: [id number] DOB: 1950-05-28 Today's Date: 08/27/2021  History of Present Illness  71 y.o. male with medical history significant of T2DM, HTN,  hx of DVT on eliquis who presented to ED with concerns for left foot infection.  S/p L transmet amputation with NWB 6/28. Pt to have surgery on 06/30 for L BKA.  Clinical Impression  Pt agreeable to physical therapy evaluation/treatment session. Pt performed bed <> chair transfer with RW while maintaining NWB on L LE. Pt with unsteadiness and difficulty sequencing 2/2 cognitive and motor planning deficits. Pt currently presents with functional limitations secondary to impairments listed in PT problem list. Pt to benefit from skilled, acute care physical therapy interventions to maximize his independence level, mobility, and quality of life.        Recommendations for follow up therapy are one component of a multi-disciplinary discharge planning process, led by the attending physician.  Recommendations may be updated based on patient status, additional functional criteria and insurance authorization.  Follow Up Recommendations Acute inpatient rehab (3hours/day)      Assistance Recommended at Discharge Frequent or constant Supervision/Assistance  Patient can return home with the following  A lot of help with walking and/or transfers;A lot of help with bathing/dressing/bathroom;Assistance with cooking/housework;Assist for transportation    Equipment Recommendations Other (comment) (going to inpatient rehab)  Recommendations for Other Services  Rehab consult    Functional Status Assessment Patient has had a recent decline in their functional status and demonstrates the ability to make significant improvements in function in a reasonable and predictable amount of time.     Precautions / Restrictions Precautions Precautions: Fall Restrictions Weight Bearing Restrictions: Yes LLE  Weight Bearing: Non weight bearing      Mobility  Bed Mobility Overal bed mobility: Modified Independent                  Transfers Overall transfer level: Needs assistance Equipment used: Rolling walker (2 wheels) Transfers: Sit to/from Stand, Bed to chair/wheelchair/BSC Sit to Stand: Min assist, +2 physical assistance, +2 safety/equipment   Step pivot transfers: Min assist, +2 physical assistance, +2 safety/equipment       General transfer comment: Pt able to maintain NWB on L LE but unsteadiness present. Pt performed combination of small hops and scooting R LE on floor to complete transfer. Pt performed bed <> chair transfer as nursing requesting pt to be back in bed.    Ambulation/Gait               General Gait Details: few feet performed with step pivot transfer but no formal gait (see above)  Stairs            Wheelchair Mobility    Modified Rankin (Stroke Patients Only)       Balance Overall balance assessment: Needs assistance Sitting-balance support: Feet supported Sitting balance-Leahy Scale: Good     Standing balance support: Reliant on assistive device for balance Standing balance-Leahy Scale: Poor                               Pertinent Vitals/Pain Pain Assessment Pain Assessment: 0-10 Pain Score: 8  Pain Location: L foot Pain Descriptors / Indicators: Aching, Jabbing Pain Intervention(s): Monitored during session, Premedicated before session, Limited activity within patient's tolerance    Home Living Family/patient expects to be discharged to:: Private residence Living Arrangements: Alone Available Help at Discharge: Other (Comment) (  no assist) Type of Home: Apartment Home Access: Level entry       Home Layout: One level Home Equipment: Conservation officer, nature (2 wheels);Cane - single point      Prior Function Prior Level of Function : Independent/Modified Independent;Driving (disabled due DVT; denies falls in last  six months)             Mobility Comments: does not use AD ADLs Comments: typically does microwave meals, sitting on the edge of bathtub to shower.  Continues to drive. Pt independent with ADL's and IADL's as well as medications and finances.     Hand Dominance   Dominant Hand: Right    Extremity/Trunk Assessment   Upper Extremity Assessment Upper Extremity Assessment: Overall WFL for tasks assessed    Lower Extremity Assessment Lower Extremity Assessment:  (Grossly 4- to 4/5 in major muscle groups bilaterally)    Cervical / Trunk Assessment Cervical / Trunk Assessment: Kyphotic  Communication   Communication: No difficulties  Cognition Arousal/Alertness: Awake/alert Behavior During Therapy: WFL for tasks assessed/performed Overall Cognitive Status: History of cognitive impairments - at baseline Area of Impairment: Memory, Problem solving, Awareness, Safety/judgement                   Current Attention Level: Selective   Following Commands: Follows one step commands with increased time Safety/Judgement: Decreased awareness of safety   Problem Solving: Slow processing, Requires verbal cues, Difficulty sequencing General Comments: Pt's sister reports pt has had memory deficits but seems to be worse in recent times        General Comments General comments (skin integrity, edema, etc.): VSS on RA    Exercises     Assessment/Plan    PT Assessment Patient needs continued PT services  PT Problem List Decreased strength;Decreased activity tolerance;Decreased balance;Pain;Decreased safety awareness;Decreased knowledge of precautions;Decreased mobility;Decreased cognition       PT Treatment Interventions DME instruction;Gait training;Stair training;Functional mobility training;Therapeutic activities;Therapeutic exercise;Balance training;Neuromuscular re-education;Cognitive remediation;Patient/family education    PT Goals (Current goals can be found in the Care  Plan section)  Acute Rehab PT Goals Patient Stated Goal: no specific goals PT Goal Formulation: With patient Time For Goal Achievement: 09/03/21 Potential to Achieve Goals: Good    Frequency Min 4X/week     Co-evaluation               AM-PAC PT "6 Clicks" Mobility  Outcome Measure Help needed turning from your back to your side while in a flat bed without using bedrails?: None Help needed moving from lying on your back to sitting on the side of a flat bed without using bedrails?: None Help needed moving to and from a bed to a chair (including a wheelchair)?: Total Help needed standing up from a chair using your arms (e.g., wheelchair or bedside chair)?: Total Help needed to walk in hospital room?: Total Help needed climbing 3-5 steps with a railing? : Total 6 Click Score: 12    End of Session Equipment Utilized During Treatment: Gait belt Activity Tolerance: Patient tolerated treatment well;Patient limited by fatigue;Patient limited by pain Patient left: in bed;with call bell/phone within reach;with bed alarm set;with family/visitor present Nurse Communication: Mobility status;Precautions;Weight bearing status PT Visit Diagnosis: Unsteadiness on feet (R26.81);Other abnormalities of gait and mobility (R26.89);Muscle weakness (generalized) (M62.81);Pain    Time: 0160-1093 PT Time Calculation (min) (ACUTE ONLY): 29 min   Charges:   PT Evaluation $PT Eval Moderate Complexity: 1 Mod PT Treatments $Therapeutic Activity: 8-22 mins  Donna Bernard, PT   Donna Bernard 08/27/2021, 4:41 PM

## 2021-08-27 NOTE — Plan of Care (Signed)
  Problem: Education: Goal: Knowledge of disease or condition will improve Outcome: Not Progressing Goal: Understanding of medication regimen will improve Outcome: Not Progressing Goal: Individualized Educational Video(s) Outcome: Not Progressing   Problem: Activity: Goal: Ability to tolerate increased activity will improve Outcome: Not Progressing   Problem: Cardiac: Goal: Ability to achieve and maintain adequate cardiopulmonary perfusion will improve Outcome: Not Progressing   Problem: Health Behavior/Discharge Planning: Goal: Ability to safely manage health-related needs after discharge will improve Outcome: Not Progressing

## 2021-08-27 NOTE — H&P (View-Only) (Signed)
Patient ID: Randall Coleman, male   DOB: 19-Feb-1951, 71 y.o.   MRN: [id number] Patient is postoperative day 1 from transmetatarsal amputation left foot.  Patient's abscess and necrotic tissue extended to the hindfoot.  Patient does not have foot salvage options available and will proceed with a below-knee amputation on the left tomorrow, Friday.  Anticipate discharge to skilled nursing.

## 2021-08-27 NOTE — Progress Notes (Signed)
Inpatient Rehab Admissions Coordinator:   Note plans for BKA tomorrow.  Will follow for postop therapy evaluations.   Shann Medal, PT, DPT Admissions Coordinator [phone] 08/27/21  8:44 AM

## 2021-08-28 LAB — BASIC METABOLIC PANEL
Anion gap: 5 (ref 5–15)
BUN: 12 mg/dL (ref 8–23)
CO2: 24 mmol/L (ref 22–32)
Calcium: 7.8 mg/dL — ABNORMAL LOW (ref 8.9–10.3)
Chloride: 104 mmol/L (ref 98–111)
Creatinine, Ser: 0.73 mg/dL (ref 0.61–1.24)
GFR, Estimated: >60 mL/min (ref >60)
Glucose, Bld: 231 mg/dL — ABNORMAL HIGH (ref 70–99)
Potassium: 4.3 mmol/L (ref 3.5–5.1)
Sodium: 133 mmol/L — ABNORMAL LOW (ref 135–145)

## 2021-08-28 LAB — GLUCOSE, CAPILLARY
Glucose-Capillary: 278 mg/dL — ABNORMAL HIGH (ref 70–99)
Glucose-Capillary: 266 mg/dL — ABNORMAL HIGH (ref 70–99)
Glucose-Capillary: 333 mg/dL — ABNORMAL HIGH (ref 70–99)
Glucose-Capillary: 339 mg/dL — ABNORMAL HIGH (ref 70–99)

## 2021-08-28 LAB — CBC
HCT: 31 % — ABNORMAL LOW (ref 39.0–52.0)
Hemoglobin: 10.2 g/dL — ABNORMAL LOW (ref 13.0–17.0)
MCH: 27.3 pg (ref 26.0–34.0)
MCHC: 32.9 g/dL (ref 30.0–36.0)
MCV: 82.9 fL (ref 80.0–100.0)
Platelets: 217 K/uL (ref 150–400)
RBC: 3.74 MIL/uL — ABNORMAL LOW (ref 4.22–5.81)
RDW: 12.3 % (ref 11.5–15.5)
WBC: 7.3 K/uL (ref 4.0–10.5)
nRBC: 0 % (ref 0.0–0.2)

## 2021-08-28 LAB — MAGNESIUM: Magnesium: 1.9 mg/dL (ref 1.7–2.4)

## 2021-08-28 MED ORDER — INSULIN DETEMIR 100 UNIT/ML ~~LOC~~ SOLN
10.0000 [IU] | Freq: Every day | SUBCUTANEOUS | Status: DC
  Administered 2021-08-28: 10 [IU] via SUBCUTANEOUS
  Filled 2021-08-28 (×2): qty 0.1

## 2021-08-28 MED ORDER — HEPARIN (PORCINE) 25000 UT/250ML-% IV SOLN
2000.0000 [IU]/h | INTRAVENOUS | Status: AC
  Administered 2021-08-28: 2000 [IU]/h via INTRAVENOUS
  Filled 2021-08-28: qty 250

## 2021-08-28 NOTE — Progress Notes (Signed)
PROGRESS NOTE    [name]  EYC:[id number] DOB: 12-06-1950 DOA: 08/25/2021 PCP: Merryl Hacker, No    Brief Narrative:  Randall Coleman is a 71 y.o. male with medical history of type 2 diabetes, hypertension, history of DVT on Eliquis presented to hospital with left foot infection.  Patient endorsed having pain swelling and redness of the foot and had foul-smelling discharge and ambulatory dysfunction.  In the ED patient was afebrile with mild leukocytosis and hyponatremia.  Lactate was elevated at 2.0.  Chest x-ray without any obvious infiltrate.  X-ray of the foot showed findings suggestive of osteomyelitis involving the distal third digit.  Patient was given 2 L of IV fluids, vancomycin and cefepime and Dr. Sharol Given was notified.  Patient was then admitted hospital for further evaluation and treatment.    During hospitalization, patient was seen by orthopedics and underwent transmetatarsal amputation on 08/26/2021 with findings of abscess in the hindfoot.  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  Continue vanc/cefepime, flagyl.  ABI of the left lower extremity was normal on 08/06/2020.  Orthopedic recommended nonweightbearing on the left with IV antibiotics.  Plan for below-knee amputation 08/28/2021 but since the patient had eaten food has been postponed.  Paroxysmal Atrial fibrillation with RVR ([facility]) Controlled at this time.  Initially needed  Cardizem drip.  Now on oral Cardizem.  On heparin drip.   On Eliquis as outpatient.  2D echocardiogram with LV ejection fraction of 40 to 45% with global hypokinesis.   Continue metoprolol.  Cardiology following.  DKA, type 2  Likely secondary to sepsis from left foot osteomyelitis.  Received IV fluids and insulin while in the hospital.  Latest hemoglobin A1c of 7.9 in 04/2021.  Repeat hemoglobin A1c at 10.7 on 08/25/2021.  Continue sliding scale and long-acting insulin at this time.  Latest POC glucose of 278.   History of DVT of lower extremity On eliquis as outpatient.  Currently on heparin drip     Essential hypertension Not on medications at home.  Currently on Cardizem and metoprolol.  Blood pressure is stable.  History of cigarette smoking.  On nicotine patch.  Mild hyponatremia.  We will continue to monitor closely.  Latest sodium of 133  Hypomagnesemia.  Replenished.  Magnesium level of 1.9  Nutrition Status:Body mass index is 23.75 kg/m.  Present on admission. Nutrition Problem: Severe Malnutrition Etiology: chronic illness (uncontrolled DM with A1C=10.7) Signs/Symptoms: severe muscle depletion, severe fat depletion Interventions: Ensure Enlive (each supplement provides 350kcal and 20 grams of protein), MVI, Juven     DVT prophylaxis: SCD's Start: 08/26/21 1056   Code Status:     Code Status: Full Code  Disposition: Home with home health/CIR. awaiting for surgical intervention.  Status is: Inpatient  Remains inpatient appropriate because: Status post transmetatarsal amputation, IV antibiotic, awaiting below-knee amputation.   Family Communication:  Communicated with the patient's sister at bedside on 08/26/2021.  Consultants:  Orthopedics Dr Sharol Given.  Procedures:  Left transmetatarsal amputation on 08/26/2021.  Antimicrobials:  Vancomycin, Flagyl, cefepime  Anti-infectives (From admission, onward)    Start     Dose/Rate Route Frequency Ordered Stop   08/26/21  0200  vancomycin (VANCOCIN) IVPB 1000 mg/200 mL premix        1,000 mg 200 mL/hr over 60 Minutes Intravenous Every 12 hours 08/25/21 1225     08/25/21 1430  metroNIDAZOLE (FLAGYL) IVPB 500 mg        500 mg 100 mL/hr over 60 Minutes Intravenous Every 12 hours 08/25/21 1422     08/25/21 1300  ceFEPIme (MAXIPIME) 2 g in sodium chloride 0.9 % 100 mL IVPB        2 g 200 mL/hr over 30 Minutes Intravenous Every 8 hours 08/25/21 1220     08/25/21 1230  vancomycin (VANCOREADY) IVPB 2000 mg/400 mL        2,000 mg 200 mL/hr over 120 Minutes Intravenous  Once 08/25/21 1220 08/25/21 1554      Subjective: Today, patient was seen and examined at bedside.  Complains of mild foot pain.  Denies any fever chills or rigors.  Objective: Vitals:   08/27/21 1929 08/28/21 0009 08/28/21 0425 08/28/21 0757  BP: 117/70 109/75 112/68 119/72  Pulse: 95 95 93 79  Resp: '17 18 18 17  '$ Temp: 98.3 F (36.8 C) 98.4 F (36.9 C) 98.7 F (37.1 C) 98.5 F (36.9 C)  TempSrc: Oral Oral Oral   SpO2: 96% 97% 93% 96%  Weight:      Height:        Intake/Output Summary (Last 24 hours) at 08/28/2021 1021 Last data filed at 08/28/2021 0426 Gross per 24 hour  Intake --  Output 1475 ml  Net -1475 ml   Filed Weights   08/25/21 1438  Weight: 83.9 kg    Physical Examination: Body mass index is 23.75 kg/m.   General:  Average built, not in obvious distress, appears older than stated age HENT:   No scleral pallor or icterus noted. Oral mucosa is moist.  Chest:  Clear breath sounds.  Diminished breath sounds bilaterally. No crackles or wheezes.  CVS: S1 &S2 heard. No murmur.  Regular rate and rhythm. Abdomen: Soft, nontender, nondistended.  Bowel sounds are heard.   Extremities: No cyanosis, clubbing or edema.  Peripheral pulses are palpable.  Status post left transmetatarsal amputation with dressing in place Psych: Alert, awake and oriented, normal mood CNS:  No cranial nerve deficits.  Power equal in all  extremities.   Skin: Warm and dry.  Status post left foot surgery.   Data Reviewed:   CBC: Recent Labs  Lab 08/25/21 1139 08/26/21 0657 08/26/21 1432 08/27/21 0401 08/28/21 0201  WBC 14.0* 10.8* 10.0 7.6 7.3  NEUTROABS 12.1*  --   --   --   --   HGB 14.3 11.4* 11.0* 11.1* 10.2*  HCT 43.7 33.3* 31.9* 32.8* 31.0*  MCV 84.4 83.3 82.2 83.0 82.9  PLT 292 215 250 240 967    Basic Metabolic Panel: Recent Labs  Lab 08/25/21 1422 08/25/21 1656 08/25/21 1755 08/27/21 0401 08/28/21 0201  NA 129* 131* 129* 130* 133*  K 4.7 4.3 4.4 4.0 4.3  CL 93* 96* 96* 101 104  CO2 17* 22 21* 23 24  GLUCOSE 383* 298* 287* 331* 231*  BUN 27* 24* 25* 18 12  CREATININE 0.98 0.80 0.74 0.84 0.73  CALCIUM 8.6* 8.3* 8.4* 8.1* 7.8*  MG 1.8  --   --  1.6*  1.9    Liver Function Tests: Recent Labs  Lab 08/25/21 1139  AST 19  ALT 20  ALKPHOS 106  BILITOT 1.2  PROT 6.8  ALBUMIN 2.7*     Radiology Studies: ECHOCARDIOGRAM COMPLETE  Result Date: 08/26/2021    ECHOCARDIOGRAM REPORT   Patient Name:   DAMIER DISANO Date of Exam: 08/26/2021 Medical Rec #:  [id number]      Height:       74.0 in Accession #:    [id number]     Weight:       185.0 lb Date of Birth:  1950-07-07       BSA:          2.102 m Patient Age:    68 years       BP:           91/62 mmHg Patient Gender: M              HR:           90 bpm. Exam Location:  Inpatient Procedure: 2D Echo, Cardiac Doppler and Color Doppler Indications:    Atrial Fibrillation  History:        Patient has prior history of Echocardiogram examinations, most                 recent 12/10/2018. Arrythmias:Atrial Fibrillation; Risk                 Factors:Hypertension and Diabetes.  Sonographer:    Jefferey Pica Referring Phys: [id number] [place]  1. Left ventricular ejection fraction, by estimation, is 40 to 45%. The left ventricle has mildly decreased function. The left ventricle demonstrates global hypokinesis. There is mild left ventricular  hypertrophy. Left ventricular diastolic parameters are indeterminate.  2. Right ventricular systolic function is normal. The right ventricular size is mildly enlarged. There is mildly elevated pulmonary artery systolic pressure.  3. Left atrial size was moderately dilated.  4. Right atrial size was mildly dilated.  5. The mitral valve is normal in structure. Trivial mitral valve regurgitation. No evidence of mitral stenosis.  6. The aortic valve is normal in structure. Aortic valve regurgitation is not visualized. No aortic stenosis is present.  7. The inferior vena cava is normal in size with greater than 50% respiratory variability, suggesting right atrial pressure of 3 mmHg. Comparison(s): The left ventricular function is worsened. FINDINGS  Left Ventricle: Left ventricular ejection fraction, by estimation, is 40 to 45%. The left ventricle has mildly decreased function. The left ventricle demonstrates global hypokinesis. The left ventricular internal cavity size was normal in size. There is  mild left ventricular hypertrophy. Left ventricular diastolic parameters are indeterminate. Right Ventricle: The right ventricular size is mildly enlarged. No increase in right ventricular wall thickness. Right ventricular systolic function is normal. There is mildly elevated pulmonary artery systolic pressure. The tricuspid regurgitant velocity is 2.36 m/s, and with an assumed right atrial pressure of 15 mmHg, the estimated right ventricular systolic pressure is 50.0 mmHg. Left Atrium: Left atrial size was moderately dilated. Right Atrium: Right atrial size was mildly dilated. Pericardium: There is no evidence of pericardial effusion. Mitral Valve: The mitral valve is normal in structure. Trivial mitral valve regurgitation. No evidence of mitral valve stenosis. Tricuspid Valve: The tricuspid valve is normal in structure. Tricuspid valve regurgitation is mild . No evidence of tricuspid stenosis. Aortic Valve: The aortic valve  is normal in structure. Aortic valve regurgitation is not visualized. No aortic stenosis is present. Aortic  valve peak gradient measures 4.3 mmHg. Pulmonic Valve: The pulmonic valve was normal in structure. Pulmonic valve regurgitation is not visualized. No evidence of pulmonic stenosis. Aorta: The aortic root is normal in size and structure. Venous: The inferior vena cava is normal in size with greater than 50% respiratory variability, suggesting right atrial pressure of 3 mmHg. IAS/Shunts: No atrial level shunt detected by color flow Doppler.  LEFT VENTRICLE PLAX 2D LVIDd:         3.90 cm LVIDs:         3.10 cm LV PW:         1.20 cm LV IVS:        1.20 cm LVOT diam:     2.40 cm LV SV:         70 LV SV Index:   33 LVOT Area:     4.52 cm  RIGHT VENTRICLE          IVC RV Basal diam:  3.60 cm  IVC diam: 2.90 cm RV Mid diam:    4.10 cm LEFT ATRIUM              Index        RIGHT ATRIUM           Index LA diam:        4.60 cm  2.19 cm/m   RA Area:     23.60 cm LA Vol (A2C):   96.9 ml  46.09 ml/m  RA Volume:   76.50 ml  36.39 ml/m LA Vol (A4C):   103.0 ml 48.99 ml/m LA Biplane Vol: 100.0 ml 47.56 ml/m  AORTIC VALVE                 PULMONIC VALVE AV Area (Vmax): 3.69 cm     PV Vmax:       0.75 m/s AV Vmax:        103.16 cm/s  PV Peak grad:  2.3 mmHg AV Peak Grad:   4.3 mmHg LVOT Vmax:      84.20 cm/s LVOT Vmean:     55.925 cm/s LVOT VTI:       0.154 m  AORTA Ao Root diam: 3.70 cm Ao Asc diam:  3.40 cm TRICUSPID VALVE TR Peak grad:   22.3 mmHg TR Vmax:        236.00 cm/s  SHUNTS Systemic VTI:  0.15 m Systemic Diam: 2.40 cm Candee Furbish MD Electronically signed by Candee Furbish MD Signature Date/Time: 08/26/2021/12:52:09 PM    Final       LOS: 3 days    Flora Lipps, MD [facility] Available via Epic secure chat 7am-7pm After these hours, please refer to coverage provider listed on [email or web address] 08/28/2021, 10:21 AM

## 2021-08-28 NOTE — Interval H&P Note (Signed)
History and Physical Interval Note:  08/28/2021 6:48 AM  Randall Coleman  has presented today for surgery, with the diagnosis of Osteomyelitis Left Foot.  The various methods of treatment have been discussed with the patient and family. After consideration of risks, benefits and other options for treatment, the patient has consented to  Procedure(s): LEFT BELOW KNEE AMPUTATION (Left) as a surgical intervention.  The patient's history has been reviewed, patient examined, no change in status, stable for surgery.  I have reviewed the patient's chart and labs.  Questions were answered to the patient's satisfaction.     [name]

## 2021-08-28 NOTE — Consult Note (Addendum)
Ref: Pcp, No   Subjective:  Awake.  Some oozing of blood from foot stump. Atrial fibrillation continues. On oral metoprolol and diltiazem.  Objective:  Vital Signs in the last 24 hours: Temp:  [98.3 F (36.8 C)-98.7 F (37.1 C)] 98.5 F (36.9 C) (06/30 0757) Pulse Rate:  [79-95] 79 (06/30 0757) Cardiac Rhythm: Atrial fibrillation (06/30 0809) Resp:  [17-18] 17 (06/30 0757) BP: (109-119)/(65-75) 119/72 (06/30 0757) SpO2:  [93 %-97 %] 96 % (06/30 0757)  Physical Exam: BP Readings from Last 1 Encounters:  08/28/21 119/72     Wt Readings from Last 1 Encounters:  08/25/21 83.9 kg    Weight change:  Body mass index is 23.75 kg/m. HEENT: [place]/AT, Eyes-Blue, Conjunctiva-Pink, Sclera-Non-icteric Neck: No JVD, No bruit, Trachea midline. Lungs:  Clear, Bilateral. Cardiac:  Irregular rhythm, normal S1 and S2, no S3. II/VI systolic murmur. Abdomen:  Soft, non-tender. BS present. Extremities:  No edema present. No cyanosis. No clubbing. Dressing over left foot stump. CNS: AxOx3, Cranial nerves grossly intact, moves all 4 extremities.  Skin: Warm and dry.   Intake/Output from previous day: 06/29 0701 - 06/30 0700 In: -  Out: 1675 [Urine:1675]    Lab Results: BMET    Component Value Date/Time   NA 133 (L) 08/28/2021 0201   NA 130 (L) 08/27/2021 0401   NA 129 (L) 08/25/2021 1755   K 4.3 08/28/2021 0201   K 4.0 08/27/2021 0401   K 4.4 08/25/2021 1755   CL 104 08/28/2021 0201   CL 101 08/27/2021 0401   CL 96 (L) 08/25/2021 1755   CO2 24 08/28/2021 0201   CO2 23 08/27/2021 0401   CO2 21 (L) 08/25/2021 1755   GLUCOSE 231 (H) 08/28/2021 0201   GLUCOSE 331 (H) 08/27/2021 0401   GLUCOSE 287 (H) 08/25/2021 1755   BUN 12 08/28/2021 0201   BUN 18 08/27/2021 0401   BUN 25 (H) 08/25/2021 1755   CREATININE 0.73 08/28/2021 0201   CREATININE 0.84 08/27/2021 0401   CREATININE 0.74 08/25/2021 1755   CALCIUM 7.8 (L) 08/28/2021 0201   CALCIUM 8.1 (L) 08/27/2021 0401   CALCIUM 8.4  (L) 08/25/2021 1755   GFRNONAA >60 08/28/2021 0201   GFRNONAA >60 08/27/2021 0401   GFRNONAA >60 08/25/2021 1755   GFRAA >60 12/14/2018 0444   GFRAA >60 12/13/2018 0530   GFRAA >60 12/12/2018 0507   CBC    Component Value Date/Time   WBC 7.3 08/28/2021 0201   RBC 3.74 (L) 08/28/2021 0201   HGB 10.2 (L) 08/28/2021 0201   HCT 31.0 (L) 08/28/2021 0201   PLT 217 08/28/2021 0201   MCV 82.9 08/28/2021 0201   MCH 27.3 08/28/2021 0201   MCHC 32.9 08/28/2021 0201   RDW 12.3 08/28/2021 0201   LYMPHSABS 0.7 08/25/2021 1139   MONOABS 1.0 08/25/2021 1139   EOSABS 0.0 08/25/2021 1139   BASOSABS 0.1 08/25/2021 1139   HEPATIC Function Panel Recent Labs    04/06/21 1012 08/07/21 1346 08/25/21 1139  PROT 6.3* 7.7 6.8  ALBUMIN 3.1* 4.2 2.7*  AST 28 14* 19  ALT 54* 15 20  ALKPHOS 73 73 106   HEMOGLOBIN A1C Lab Results  Component Value Date   MPG 260 08/25/2021   CARDIAC ENZYMES No results found for: "CKTOTAL", "CKMB", "CKMBINDEX", "TROPONINI" BNP No results for input(s): "PROBNP" in the last 8760 hours. TSH Recent Labs    08/25/21 1422  TSH 1.492   CHOLESTEROL No results for input(s): "CHOL" in the last 8760 hours.  Scheduled Meds:  vitamin C  1,000 mg Oral Daily   diltiazem  120 mg Oral Q12H   docusate sodium  100 mg Oral Daily   feeding supplement (GLUCERNA SHAKE)  237 mL Oral TID BM   insulin aspart  0-9 Units Subcutaneous TID WC   insulin detemir  10 Units Subcutaneous QHS   magnesium oxide  400 mg Oral BID   metoprolol tartrate  50 mg Oral BID   multivitamin with minerals  1 tablet Oral Daily   mupirocin ointment  1 Application Nasal BID   nicotine  21 mg Transdermal Daily   nutrition supplement (JUVEN)  1 packet Oral BID   pantoprazole  40 mg Oral Daily   zinc sulfate  220 mg Oral Daily   Continuous Infusions:  ceFEPime (MAXIPIME) IV 2 g (08/28/21 0510)   lactated ringers Stopped (08/27/21 2129)   metronidazole 500 mg (08/28/21 0247)   vancomycin 1,000 mg  (08/28/21 0557)   PRN Meds:.acetaminophen, alum & mag hydroxide-simeth, bisacodyl, dextrose, guaiFENesin-dextromethorphan, hydrALAZINE, HYDROmorphone (DILAUDID) injection, labetalol, metoprolol tartrate, ondansetron, oxyCODONE, oxyCODONE, phenol, polyethylene glycol, potassium chloride  Assessment/Plan:  Atrial fibrillation  Type 2 DM with hyperglycemia PVD Chronic anticoagulation use Left foot amputation  Plan: Left BKA tomorrow per surgery.   LOS: 2 days   Time spent including chart review, lab review, examination, discussion with patient/Nurse : 30 min   Dixie Dials  MD  08/28/2021, 9:41 AM

## 2021-08-28 NOTE — Progress Notes (Signed)
OT Cancellation Note  Patient Details Name: Randall Coleman MRN: [id number] DOB: [birth year]   Cancelled Treatment:    Reason Eval/Treat Not Completed: Other (comment) (pending OR for BKA) OT to follow up after surgery as appropriate  Jeri Modena 08/28/2021, 8:15 AM

## 2021-08-28 NOTE — Consult Note (Signed)
Ref: Pcp, No   Subjective:  Awake. Ate by mistake. Left BKA procedure postponed. Atrial fibrillation with heart rate in 90-100/min.  Objective:  Vital Signs in the last 24 hours: Temp:  [98.3 F (36.8 C)-98.7 F (37.1 C)] 98.5 F (36.9 C) (06/30 0757) Pulse Rate:  [79-95] 79 (06/30 0757) Cardiac Rhythm: Atrial fibrillation (06/30 0809) Resp:  [17-18] 17 (06/30 0757) BP: (109-119)/(65-75) 119/72 (06/30 0757) SpO2:  [93 %-97 %] 96 % (06/30 0757)  Physical Exam: BP Readings from Last 1 Encounters:  08/28/21 119/72     Wt Readings from Last 1 Encounters:  08/25/21 83.9 kg    Weight change:  Body mass index is 23.75 kg/m. HEENT: [place]/AT, Eyes-Blue, Conjunctiva-Pale pink, Sclera-Non-icteric Neck: No JVD, No bruit, Trachea midline. Lungs:  Clear, Bilateral. Cardiac:  Irregular rhythm, normal S1 and S2, no S3. II/VI systolic murmur. Abdomen:  Soft, non-tender. BS present. Extremities:  No edema present. No cyanosis. No clubbing. Dressing over left foot post metatarsal amputation. CNS: AxOx3, Cranial nerves grossly intact, moves all 4 extremities.  Skin: Warm and dry.   Intake/Output from previous day: 06/29 0701 - 06/30 0700 In: -  Out: 1675 [Urine:1675]    Lab Results: BMET    Component Value Date/Time   NA 133 (L) 08/28/2021 0201   NA 130 (L) 08/27/2021 0401   NA 129 (L) 08/25/2021 1755   K 4.3 08/28/2021 0201   K 4.0 08/27/2021 0401   K 4.4 08/25/2021 1755   CL 104 08/28/2021 0201   CL 101 08/27/2021 0401   CL 96 (L) 08/25/2021 1755   CO2 24 08/28/2021 0201   CO2 23 08/27/2021 0401   CO2 21 (L) 08/25/2021 1755   GLUCOSE 231 (H) 08/28/2021 0201   GLUCOSE 331 (H) 08/27/2021 0401   GLUCOSE 287 (H) 08/25/2021 1755   BUN 12 08/28/2021 0201   BUN 18 08/27/2021 0401   BUN 25 (H) 08/25/2021 1755   CREATININE 0.73 08/28/2021 0201   CREATININE 0.84 08/27/2021 0401   CREATININE 0.74 08/25/2021 1755   CALCIUM 7.8 (L) 08/28/2021 0201   CALCIUM 8.1 (L) 08/27/2021  0401   CALCIUM 8.4 (L) 08/25/2021 1755   GFRNONAA >60 08/28/2021 0201   GFRNONAA >60 08/27/2021 0401   GFRNONAA >60 08/25/2021 1755   GFRAA >60 12/14/2018 0444   GFRAA >60 12/13/2018 0530   GFRAA >60 12/12/2018 0507   CBC    Component Value Date/Time   WBC 7.3 08/28/2021 0201   RBC 3.74 (L) 08/28/2021 0201   HGB 10.2 (L) 08/28/2021 0201   HCT 31.0 (L) 08/28/2021 0201   PLT 217 08/28/2021 0201   MCV 82.9 08/28/2021 0201   MCH 27.3 08/28/2021 0201   MCHC 32.9 08/28/2021 0201   RDW 12.3 08/28/2021 0201   LYMPHSABS 0.7 08/25/2021 1139   MONOABS 1.0 08/25/2021 1139   EOSABS 0.0 08/25/2021 1139   BASOSABS 0.1 08/25/2021 1139   HEPATIC Function Panel Recent Labs    04/06/21 1012 08/07/21 1346 08/25/21 1139  PROT 6.3* 7.7 6.8  ALBUMIN 3.1* 4.2 2.7*  AST 28 14* 19  ALT 54* 15 20  ALKPHOS 73 73 106   HEMOGLOBIN A1C Lab Results  Component Value Date   MPG 260 08/25/2021   CARDIAC ENZYMES No results found for: "CKTOTAL", "CKMB", "CKMBINDEX", "TROPONINI" BNP No results for input(s): "PROBNP" in the last 8760 hours. TSH Recent Labs    08/25/21 1422  TSH 1.492   CHOLESTEROL No results for input(s): "CHOL" in the last 8760 hours.  Scheduled Meds:  vitamin C  1,000 mg Oral Daily   diltiazem  120 mg Oral Q12H   docusate sodium  100 mg Oral Daily   feeding supplement (GLUCERNA SHAKE)  237 mL Oral TID BM   insulin aspart  0-9 Units Subcutaneous TID WC   insulin detemir  10 Units Subcutaneous QHS   magnesium oxide  400 mg Oral BID   metoprolol tartrate  50 mg Oral BID   multivitamin with minerals  1 tablet Oral Daily   mupirocin ointment  1 Application Nasal BID   nicotine  21 mg Transdermal Daily   nutrition supplement (JUVEN)  1 packet Oral BID   pantoprazole  40 mg Oral Daily   zinc sulfate  220 mg Oral Daily   Continuous Infusions:  ceFEPime (MAXIPIME) IV 2 g (08/28/21 0510)   lactated ringers Stopped (08/27/21 2129)   metronidazole 500 mg (08/28/21 0247)    vancomycin 1,000 mg (08/28/21 0557)   PRN Meds:.acetaminophen, alum & mag hydroxide-simeth, bisacodyl, dextrose, guaiFENesin-dextromethorphan, hydrALAZINE, HYDROmorphone (DILAUDID) injection, labetalol, metoprolol tartrate, ondansetron, oxyCODONE, oxyCODONE, phenol, polyethylene glycol, potassium chloride  Assessment/Plan:  Atrial fibrillation with controlled ventricular response Type 2 DM with hyperglycemia PVD Chronic anticoagulation use Left foot amputation  Plan: Awaiting left BKA. Continue medical treatment.   LOS: 3 days   Time spent including chart review, lab review, examination, discussion with patient : 30 min   Dixie Dials  MD  08/28/2021, 9:46 AM

## 2021-08-28 NOTE — Anesthesia Preprocedure Evaluation (Signed)
Anesthesia Evaluation  Patient identified by MRN, date of birth, ID band Patient awake    Reviewed: Allergy & Precautions, NPO status , Patient's Chart, lab work & pertinent test results, Unable to perform ROS - Chart review only  History of Anesthesia Complications Negative for: history of anesthetic complications  Airway Mallampati: II  TM Distance: >3 FB Neck ROM: Full    Dental  (+) Edentulous Lower, Edentulous Upper   Pulmonary Current Smoker and Patient abstained from smoking.,    Pulmonary exam normal        Cardiovascular hypertension, + DVT  Normal cardiovascular exam+ dysrhythmias Atrial Fibrillation   ECHO: Left ventricular ejection fraction, by estimation, is 40 to 45%. The left ventricle has mildly decreased function. The left ventricle demonstrates global hypokinesis. There is mild left ventricular hypertrophy. Left ventricular diastolic parameters are indeterminate. Right ventricular systolic function is normal. The right ventricular size is mildly enlarged. There is mildly elevated pulmonary artery systolic pressure. Left atrial size was moderately dilated. Right atrial size was mildly dilated. The mitral valve is normal in structure. Trivial mitral valve regurgitation. No evidence of mitral stenosis. The aortic valve is normal in structure. Aortic valve regurgitation is not visualized. No aortic stenosis is present. The inferior vena cava is normal in size with greater than 50% respiratory variability, suggesting right atrial pressure of 3 mmHg.   Neuro/Psych PSYCHIATRIC DISORDERS Depression negative neurological ROS     GI/Hepatic negative GI ROS, Neg liver ROS,   Endo/Other  diabetes, Type 2, Oral Hypoglycemic Agents Na 129 Ca 8.4 Cl 96   Renal/GU negative Renal ROS     Musculoskeletal  Osteomyelitis     Abdominal   Peds  Hematology  (+) Blood dyscrasia, anemia ,  On eliquis     Anesthesia Other Findings Osteomyelitis Left Foot  Reproductive/Obstetrics                           Anesthesia Physical  Anesthesia Plan  ASA: 3  Anesthesia Plan: Regional   Post-op Pain Management: Regional block*   Induction: Intravenous  PONV Risk Score and Plan: 1 and Treatment may vary due to age or medical condition, Ondansetron, Midazolam, Dexamethasone and Propofol infusion  Airway Management Planned: Simple Face Mask  Additional Equipment:   Intra-op Plan:   Post-operative Plan:   Informed Consent: I have reviewed the patients History and Physical, chart, labs and discussed the procedure including the risks, benefits and alternatives for the proposed anesthesia with the patient or authorized representative who has indicated his/her understanding and acceptance.       Plan Discussed with: CRNA  Anesthesia Plan Comments:        Anesthesia Quick Evaluation

## 2021-08-28 NOTE — Progress Notes (Addendum)
ANTICOAGULATION CONSULT NOTE  Pharmacy Consult for Heparin Indication: atrial fibrillation and hx of VTE  No Known Allergies  Patient Measurements: Height: '6\' 2"'$  (188 cm) Weight: 83.9 kg (184 lb 15.5 oz) IBW/kg (Calculated) : 82.2  Heparin Dosing Weight: 84 kg  Vital Signs: Temp: 98.5 F (36.9 C) (06/30 0757) Temp Source: Oral (06/30 0425) BP: 119/72 (06/30 0757) Pulse Rate: 79 (06/30 0757)  Labs: Recent Labs    08/25/21 1755 08/25/21 2117 08/26/21 0657 08/26/21 1432 08/26/21 1648 08/27/21 0401 08/27/21 1258 08/27/21 2250 08/28/21 0201  HGB  --   --    < > 11.0*  --  11.1*  --   --  10.2*  HCT  --   --    < > 31.9*  --  32.8*  --   --  31.0*  PLT  --   --    < > 250  --  240  --   --  217  APTT  --  27  --   --   --   --   --   --   --   HEPARINUNFRC  --  <0.10*   < >  --    < > 0.14* 0.32 0.22*  --   CREATININE 0.74  --   --   --   --  0.84  --   --  0.73   < > = values in this interval not displayed.     Estimated Creatinine Clearance: 101.3 mL/min (by C-G formula based on SCr of 0.73 mg/dL).   Assessment: 69 yom with a history of DVT and atrial fibrillation on Eliquis PTA (LD 6.26). Patient presented with pain, swelling and redness in his left foot. Heparin per pharmacy consult placed while Eliquis on hold.   Anticipated to have falsely high anti-Xa level secondary to DOAC use, however anti-Xa levels have remained undetectable since admission. Unsure if consistently taking DOAC PTA. Will DC aPTT monitoring and continue adjusting heparin levels based on anti-Xa instead.   No further heparin infusion issues or additional signs of bleeding. Per discussion with Dr. Sharol Given, held heparin infusion ~6 hours before procedure today (0700). Case scheduled for 6/30 ~1200-1400, however is now rescheduled for 07/01 @ 0700. Will resume heparin infusion at previous rate and hold at 7/01 @ 0100.   Goal of Therapy:  Heparin level 0.3-0.7 units/ml aPTT 66-102 seconds Monitor  platelets by anticoagulation protocol: Yes   Plan:  Resume heparin infusion at 2000 units/hr Check heparin level in 8 hours and daily while on heparin Stop heparin infusion 7/01 @ 0100 Continue to monitor H&H and platelets    Thank you for allowing pharmacy to be a part of this patient's care.  Ardyth Harps, PharmD Clinical Pharmacist

## 2021-08-29 ENCOUNTER — Inpatient Hospital Stay (HOSPITAL_COMMUNITY): Payer: No Typology Code available for payment source | Admitting: Anesthesiology

## 2021-08-29 ENCOUNTER — Other Ambulatory Visit: Payer: Self-pay

## 2021-08-29 ENCOUNTER — Encounter (HOSPITAL_COMMUNITY)
Admission: EM | Disposition: A | Discharge status: Rehabilitation Facility | Payer: Self-pay | Source: Home / Self Care | Attending: Internal Medicine

## 2021-08-29 ENCOUNTER — Encounter (HOSPITAL_COMMUNITY): Payer: Self-pay | Admitting: Family Medicine

## 2021-08-29 DIAGNOSIS — D649 Anemia, unspecified: Secondary | ICD-10-CM

## 2021-08-29 DIAGNOSIS — M869 Osteomyelitis, unspecified: Secondary | ICD-10-CM

## 2021-08-29 DIAGNOSIS — E1169 Type 2 diabetes mellitus with other specified complication: Secondary | ICD-10-CM

## 2021-08-29 DIAGNOSIS — Z7984 Long term (current) use of oral hypoglycemic drugs: Secondary | ICD-10-CM

## 2021-08-29 HISTORY — PX: AMPUTATION: SHX166

## 2021-08-29 LAB — CBC
HCT: 31.9 % — ABNORMAL LOW (ref 39.0–52.0)
Hemoglobin: 11 g/dL — ABNORMAL LOW (ref 13.0–17.0)
MCH: 28.2 pg (ref 26.0–34.0)
MCHC: 34.5 g/dL (ref 30.0–36.0)
MCV: 81.8 fL (ref 80.0–100.0)
Platelets: 214 10*3/uL (ref 150–400)
RBC: 3.9 MIL/uL — ABNORMAL LOW (ref 4.22–5.81)
RDW: 12.3 % (ref 11.5–15.5)
WBC: 6.6 10*3/uL (ref 4.0–10.5)
nRBC: 0 % (ref 0.0–0.2)

## 2021-08-29 LAB — BASIC METABOLIC PANEL
Anion gap: 6 (ref 5–15)
BUN: 7 mg/dL — ABNORMAL LOW (ref 8–23)
CO2: 25 mmol/L (ref 22–32)
Calcium: 8.1 mg/dL — ABNORMAL LOW (ref 8.9–10.3)
Chloride: 104 mmol/L (ref 98–111)
Creatinine, Ser: 0.64 mg/dL (ref 0.61–1.24)
GFR, Estimated: >60 mL/min (ref >60)
Glucose, Bld: 318 mg/dL — ABNORMAL HIGH (ref 70–99)
Potassium: 4 mmol/L (ref 3.5–5.1)
Sodium: 135 mmol/L (ref 135–145)

## 2021-08-29 LAB — GLUCOSE, CAPILLARY
Glucose-Capillary: 263 mg/dL — ABNORMAL HIGH (ref 70–99)
Glucose-Capillary: 316 mg/dL — ABNORMAL HIGH (ref 70–99)
Glucose-Capillary: 242 mg/dL — ABNORMAL HIGH (ref 70–99)
Glucose-Capillary: 210 mg/dL — ABNORMAL HIGH (ref 70–99)
Glucose-Capillary: 248 mg/dL — ABNORMAL HIGH (ref 70–99)
Glucose-Capillary: 239 mg/dL — ABNORMAL HIGH (ref 70–99)

## 2021-08-29 LAB — HEPARIN LEVEL (UNFRACTIONATED): Heparin Unfractionated: 0.45 IU/mL (ref 0.30–0.70)

## 2021-08-29 LAB — MAGNESIUM: Magnesium: 1.7 mg/dL (ref 1.7–2.4)

## 2021-08-29 SURGERY — AMPUTATION BELOW KNEE
Anesthesia: Regional | Site: Knee | Laterality: Left

## 2021-08-29 MED ORDER — CHLORHEXIDINE GLUCONATE 4 % EX LIQD: 60.0000 mL | Freq: Once | CUTANEOUS | Status: DC

## 2021-08-29 MED ORDER — ZINC SULFATE 220 (50 ZN) MG PO CAPS: 220.0000 mg | ORAL_CAPSULE | Freq: Every day | ORAL | Status: DC

## 2021-08-29 MED ORDER — ONDANSETRON HCL 4 MG/2ML IJ SOLN: 4.0000 mg | Freq: Once | INTRAMUSCULAR | Status: DC | PRN

## 2021-08-29 MED ORDER — PROPOFOL 10 MG/ML IV BOLUS
INTRAVENOUS | Status: AC
  Filled 2021-08-29: qty 20

## 2021-08-29 MED ORDER — SODIUM CHLORIDE 0.9 % IV SOLN: INTRAVENOUS | Status: DC

## 2021-08-29 MED ORDER — GUAIFENESIN-DM 100-10 MG/5ML PO SYRP: 15.0000 mL | ORAL_SOLUTION | ORAL | Status: DC | PRN

## 2021-08-29 MED ORDER — ACETAMINOPHEN 325 MG PO TABS: 325.0000 mg | ORAL_TABLET | Freq: Four times a day (QID) | ORAL | Status: DC | PRN

## 2021-08-29 MED ORDER — MIDAZOLAM HCL 2 MG/2ML IJ SOLN
INTRAMUSCULAR | Status: AC
  Filled 2021-08-29: qty 2

## 2021-08-29 MED ORDER — PHENOL 1.4 % MT LIQD: 1.0000 | OROMUCOSAL | Status: DC | PRN

## 2021-08-29 MED ORDER — PROPOFOL 10 MG/ML IV BOLUS
INTRAVENOUS | Status: DC | PRN
  Administered 2021-08-29: 20 mg via INTRAVENOUS

## 2021-08-29 MED ORDER — TRANEXAMIC ACID-NACL 1000-0.7 MG/100ML-% IV SOLN
1000.0000 mg | INTRAVENOUS | Status: AC
  Administered 2021-08-29: 1000 mg via INTRAVENOUS
  Filled 2021-08-29: qty 100

## 2021-08-29 MED ORDER — BISACODYL 5 MG PO TBEC: 5.0000 mg | DELAYED_RELEASE_TABLET | Freq: Every day | ORAL | Status: DC | PRN

## 2021-08-29 MED ORDER — MAGNESIUM CITRATE PO SOLN: 1.0000 | Freq: Once | ORAL | Status: DC | PRN

## 2021-08-29 MED ORDER — ACETAMINOPHEN 500 MG PO TABS
1000.0000 mg | ORAL_TABLET | Freq: Once | ORAL | Status: AC
  Administered 2021-08-29: 1000 mg via ORAL
  Filled 2021-08-29: qty 2

## 2021-08-29 MED ORDER — FENTANYL CITRATE (PF) 100 MCG/2ML IJ SOLN: 25.0000 ug | INTRAMUSCULAR | Status: DC | PRN

## 2021-08-29 MED ORDER — INSULIN DETEMIR 100 UNIT/ML ~~LOC~~ SOLN
12.0000 [IU] | Freq: Every day | SUBCUTANEOUS | Status: DC
  Administered 2021-08-29: 12 [IU] via SUBCUTANEOUS
  Filled 2021-08-29 (×2): qty 0.12

## 2021-08-29 MED ORDER — JUVEN PO PACK: 1.0000 | PACK | Freq: Two times a day (BID) | ORAL | Status: DC

## 2021-08-29 MED ORDER — ONDANSETRON HCL 4 MG/2ML IJ SOLN
INTRAMUSCULAR | Status: AC
Start: 2021-08-29 — End: ?
  Filled 2021-08-29: qty 2

## 2021-08-29 MED ORDER — MIDAZOLAM HCL 2 MG/2ML IJ SOLN
INTRAMUSCULAR | Status: DC | PRN
  Administered 2021-08-29: 2 mg via INTRAVENOUS

## 2021-08-29 MED ORDER — PHENYLEPHRINE HCL-NACL 20-0.9 MG/250ML-% IV SOLN
INTRAVENOUS | Status: DC | PRN
  Administered 2021-08-29: 25 ug/min via INTRAVENOUS

## 2021-08-29 MED ORDER — PROPOFOL 500 MG/50ML IV EMUL
INTRAVENOUS | Status: DC | PRN
  Administered 2021-08-29: 125 ug/kg/min via INTRAVENOUS

## 2021-08-29 MED ORDER — FENTANYL CITRATE (PF) 250 MCG/5ML IJ SOLN
INTRAMUSCULAR | Status: DC | PRN
  Administered 2021-08-29 (×2): 50 ug via INTRAVENOUS

## 2021-08-29 MED ORDER — OXYCODONE HCL 5 MG PO TABS: 5.0000 mg | ORAL_TABLET | ORAL | Status: DC | PRN

## 2021-08-29 MED ORDER — LABETALOL HCL 5 MG/ML IV SOLN: 10.0000 mg | INTRAVENOUS | Status: DC | PRN

## 2021-08-29 MED ORDER — BUPIVACAINE HCL (PF) 0.25 % IJ SOLN
INTRAMUSCULAR | Status: DC | PRN
  Administered 2021-08-29 (×2): 15 mL via PERINEURAL

## 2021-08-29 MED ORDER — ONDANSETRON HCL 4 MG/2ML IJ SOLN
INTRAMUSCULAR | Status: DC | PRN
  Administered 2021-08-29: 4 mg via INTRAVENOUS

## 2021-08-29 MED ORDER — PANTOPRAZOLE SODIUM 40 MG PO TBEC: 40.0000 mg | DELAYED_RELEASE_TABLET | Freq: Every day | ORAL | Status: DC

## 2021-08-29 MED ORDER — HYDRALAZINE HCL 20 MG/ML IJ SOLN: 5.0000 mg | INTRAMUSCULAR | Status: DC | PRN

## 2021-08-29 MED ORDER — ONDANSETRON HCL 4 MG/2ML IJ SOLN: 4.0000 mg | Freq: Four times a day (QID) | INTRAMUSCULAR | Status: DC | PRN

## 2021-08-29 MED ORDER — POVIDONE-IODINE 10 % EX SWAB
2.0000 | Freq: Once | CUTANEOUS | Status: AC
  Administered 2021-08-29: 2 via TOPICAL

## 2021-08-29 MED ORDER — MAGNESIUM SULFATE 2 GM/50ML IV SOLN: 2.0000 g | Freq: Every day | INTRAVENOUS | Status: DC | PRN

## 2021-08-29 MED ORDER — HYDROMORPHONE HCL 1 MG/ML IJ SOLN: 0.2500 mg | INTRAMUSCULAR | Status: DC | PRN

## 2021-08-29 MED ORDER — ALUM & MAG HYDROXIDE-SIMETH 200-200-20 MG/5ML PO SUSP: 15.0000 mL | ORAL | Status: DC | PRN

## 2021-08-29 MED ORDER — PROMETHAZINE HCL 25 MG/ML IJ SOLN: 6.2500 mg | INTRAMUSCULAR | Status: DC | PRN

## 2021-08-29 MED ORDER — BUPIVACAINE-EPINEPHRINE (PF) 0.5% -1:200000 IJ SOLN
INTRAMUSCULAR | Status: DC | PRN
  Administered 2021-08-29 (×2): 15 mL via PERINEURAL

## 2021-08-29 MED ORDER — INSULIN ASPART 100 UNIT/ML IJ SOLN
0.0000 [IU] | INTRAMUSCULAR | Status: DC
  Administered 2021-08-29: 7 [IU] via SUBCUTANEOUS

## 2021-08-29 MED ORDER — DOCUSATE SODIUM 100 MG PO CAPS: 100.0000 mg | ORAL_CAPSULE | Freq: Every day | ORAL | Status: DC

## 2021-08-29 MED ORDER — POLYETHYLENE GLYCOL 3350 17 G PO PACK: 17.0000 g | PACK | Freq: Every day | ORAL | Status: DC | PRN

## 2021-08-29 MED ORDER — INSULIN ASPART 100 UNIT/ML IJ SOLN
2.0000 [IU] | Freq: Three times a day (TID) | INTRAMUSCULAR | Status: DC
  Administered 2021-08-29 – 2021-08-31 (×6): 2 [IU] via SUBCUTANEOUS

## 2021-08-29 MED ORDER — AMISULPRIDE (ANTIEMETIC) 5 MG/2ML IV SOLN: 10.0000 mg | Freq: Once | INTRAVENOUS | Status: DC | PRN

## 2021-08-29 MED ORDER — HYDROMORPHONE HCL 1 MG/ML IJ SOLN: 0.5000 mg | INTRAMUSCULAR | Status: DC | PRN

## 2021-08-29 MED ORDER — CEFAZOLIN SODIUM-DEXTROSE 2-4 GM/100ML-% IV SOLN
2.0000 g | INTRAVENOUS | Status: DC
  Filled 2021-08-29: qty 100

## 2021-08-29 MED ORDER — POTASSIUM CHLORIDE CRYS ER 20 MEQ PO TBCR: 20.0000 meq | EXTENDED_RELEASE_TABLET | Freq: Every day | ORAL | Status: DC | PRN

## 2021-08-29 MED ORDER — 0.9 % SODIUM CHLORIDE (POUR BTL) OPTIME
TOPICAL | Status: DC | PRN
  Administered 2021-08-29: 1000 mL

## 2021-08-29 MED ORDER — OXYCODONE HCL 5 MG PO TABS: 10.0000 mg | ORAL_TABLET | ORAL | Status: DC | PRN

## 2021-08-29 MED ORDER — METOPROLOL TARTRATE 5 MG/5ML IV SOLN: 2.0000 mg | INTRAVENOUS | Status: DC | PRN

## 2021-08-29 MED ORDER — ASCORBIC ACID 500 MG PO TABS: 1000.0000 mg | ORAL_TABLET | Freq: Every day | ORAL | Status: DC

## 2021-08-29 MED ORDER — FENTANYL CITRATE (PF) 250 MCG/5ML IJ SOLN
INTRAMUSCULAR | Status: AC
  Filled 2021-08-29: qty 5

## 2021-08-29 SURGICAL SUPPLY — 42 items
BAG COUNTER SPONGE SURGICOUNT (BAG) IMPLANT
BAG SPNG CNTER NS LX DISP (BAG)
BLADE SAW RECIP 87.9 MT (BLADE) ×2 IMPLANT
BLADE SURG 21 STRL SS (BLADE) ×2 IMPLANT
BNDG COHESIVE 6X5 TAN STRL LF (GAUZE/BANDAGES/DRESSINGS) ×1 IMPLANT
CANISTER WOUND CARE 500ML ATS (WOUND CARE) ×2 IMPLANT
CANISTER WOUNDNEG PRESSURE 500 (CANNISTER) ×1 IMPLANT
COVER SURGICAL LIGHT HANDLE (MISCELLANEOUS) ×2 IMPLANT
CUFF TOURN SGL QUICK 34 (TOURNIQUET CUFF) ×2
CUFF TRNQT CYL 34X4.125X (TOURNIQUET CUFF) ×1 IMPLANT
DRAPE DERMATAC (DRAPES) ×2 IMPLANT
DRAPE INCISE IOBAN 66X45 STRL (DRAPES) ×2 IMPLANT
DRAPE U-SHAPE 47X51 STRL (DRAPES) ×2 IMPLANT
DRESSING PREVENA PLUS CUSTOM (GAUZE/BANDAGES/DRESSINGS) ×1 IMPLANT
DRSG PREVENA PLUS CUSTOM (GAUZE/BANDAGES/DRESSINGS) ×2
DURAPREP 26ML APPLICATOR (WOUND CARE) ×2 IMPLANT
ELECT REM PT RETURN 9FT ADLT (ELECTROSURGICAL) ×2
ELECTRODE REM PT RTRN 9FT ADLT (ELECTROSURGICAL) ×1 IMPLANT
GLOVE BIOGEL PI IND STRL 9 (GLOVE) ×1 IMPLANT
GLOVE BIOGEL PI INDICATOR 9 (GLOVE) ×2
GLOVE SURG ORTHO 9.0 STRL STRW (GLOVE) ×3 IMPLANT
GOWN STRL REUS W/ TWL XL LVL3 (GOWN DISPOSABLE) ×2 IMPLANT
GOWN STRL REUS W/TWL XL LVL3 (GOWN DISPOSABLE) ×4
GRAFT SKIN WND MICRO 38 (Tissue) ×1 IMPLANT
GRAFT SKIN WND OMEGA3 SB 7X10 (Tissue) ×1 IMPLANT
KIT BASIN OR (CUSTOM PROCEDURE TRAY) ×2 IMPLANT
KIT TURNOVER KIT B (KITS) ×2 IMPLANT
MANIFOLD NEPTUNE II (INSTRUMENTS) ×2 IMPLANT
NS IRRIG 1000ML POUR BTL (IV SOLUTION) ×2 IMPLANT
PACK ORTHO EXTREMITY (CUSTOM PROCEDURE TRAY) ×2 IMPLANT
PAD ARMBOARD 7.5X6 YLW CONV (MISCELLANEOUS) ×2 IMPLANT
PREVENA RESTOR ARTHOFORM 46X30 (CANNISTER) ×2 IMPLANT
SPONGE T-LAP 18X18 ~~LOC~~+RFID (SPONGE) IMPLANT
STAPLER VISISTAT 35W (STAPLE) ×1 IMPLANT
STOCKINETTE IMPERVIOUS LG (DRAPES) ×2 IMPLANT
SUT ETHILON 2 0 PSLX (SUTURE) IMPLANT
SUT SILK 2 0 (SUTURE) ×2
SUT SILK 2-0 18XBRD TIE 12 (SUTURE) ×1 IMPLANT
SUT VIC AB 1 CTX 27 (SUTURE) ×4 IMPLANT
TOWEL GREEN STERILE (TOWEL DISPOSABLE) ×2 IMPLANT
TUBE CONNECTING 12X1/4 (SUCTIONS) ×2 IMPLANT
YANKAUER SUCT BULB TIP NO VENT (SUCTIONS) ×2 IMPLANT

## 2021-08-29 NOTE — Progress Notes (Signed)
Patient ID: Randall Coleman, male   DOB: Jul 06, 1950, 71 y.o.   MRN: [id number] Patient is postoperative day 1 left transtibial amputation.  There is no drainage in the wound VAC canister.  Tissue margins were clear and antibiotics can be discontinued at this time.  Anticipate discharge to skilled nursing versus inpatient rehab.

## 2021-08-29 NOTE — Anesthesia Procedure Notes (Signed)
Anesthesia Regional Block: Popliteal block   Pre-Anesthetic Checklist: , timeout performed,  Correct Patient, Correct Site, Correct Laterality,  Correct Procedure, Correct Position, site marked,  Risks and benefits discussed,  Surgical consent,  Pre-op evaluation,  At surgeon's request and post-op pain management  Laterality: Left  Prep: chloraprep       Needles:  Injection technique: Single-shot  Needle Type: Echogenic Stimulator Needle     Needle Length: 10cm  Needle Gauge: 20     Additional Needles:   Procedures:,,,, ultrasound used (permanent image in chart),,    Narrative:  Start time: 08/29/2021 7:00 AM End time: 08/29/2021 7:10 AM Injection made incrementally with aspirations every 5 mL.  Performed by: Personally  Anesthesiologist: Murvin Natal, MD  Additional Notes: Functioning IV was confirmed and monitors were applied.  A timeout was performed. Sterile prep, hand hygiene and sterile gloves were used. A 185m 20ga Bbraun echogenic stimulator needle was used. Negative aspiration and negative test dose prior to incremental administration of local anesthetic. The patient tolerated the procedure well.  Ultrasound guidance: relevent anatomy identified, needle position confirmed, local anesthetic spread visualized around nerve(s), vascular puncture avoided.  Image printed for medical record.

## 2021-08-29 NOTE — Progress Notes (Signed)
Orthopedic Tech Progress Note Patient Details:  Randall Coleman Mar 27, 1950 [id number]  Patient ID: Randall Coleman, male   DOB: 05-05-1950, 71 y.o.   MRN: [id number] Order placed with [facility] for skrinker. Vernona Rieger 08/29/2021, 9:25 AM

## 2021-08-29 NOTE — Anesthesia Procedure Notes (Signed)
Anesthesia Regional Block: Adductor canal block   Pre-Anesthetic Checklist: , timeout performed,  Correct Patient, Correct Site, Correct Laterality,  Correct Procedure,, site marked,  Risks and benefits discussed,  Surgical consent,  Pre-op evaluation,  At surgeon's request and post-op pain management  Laterality: Left  Prep: chloraprep       Needles:  Injection technique: Single-shot  Needle Type: Echogenic Stimulator Needle     Needle Length: 10cm  Needle Gauge: 20     Additional Needles:   Procedures:,,,, ultrasound used (permanent image in chart),,    Narrative:  Start time: 08/29/2021 7:10 AM End time: 08/29/2021 7:20 AM Injection made incrementally with aspirations every 5 mL.  Performed by: Personally  Anesthesiologist: Murvin Natal, MD  Additional Notes: Functioning IV was confirmed and monitors were applied. A time-out was performed. Hand hygiene and sterile gloves were used. The thigh was placed in a frog-leg position and prepped in a sterile fashion. A 119m 20ga Bbraun echogenic stimulator needle was placed using ultrasound guidance.  Negative aspiration and negative test dose prior to incremental administration of local anesthetic. The patient tolerated the procedure well.

## 2021-08-29 NOTE — Op Note (Signed)
08/29/2021  8:56 AM  PATIENT:  Randall Coleman    PRE-OPERATIVE DIAGNOSIS:  Osteomyelitis Left Foot  POST-OPERATIVE DIAGNOSIS:  Same  PROCEDURE:  LEFT BELOW KNEE AMPUTATION Application Kerecis micro powder 38 cm and application of Kerecis sheet 7 x 10 cm. Application wound VAC arthroform and customizable.  SURGEON:  [name], MD  ANESTHESIA:   General  PREOPERATIVE INDICATIONS:  [name] is a  71 y.o. male with a diagnosis of Osteomyelitis Left Foot who failed conservative measures and elected for surgical management.    The risks benefits and alternatives were discussed with the patient preoperatively including but not limited to the risks of infection, bleeding, nerve injury, cardiopulmonary complications, the need for revision surgery, among others, and the patient was willing to proceed.  OPERATIVE IMPLANTS: Kerecis tissue graft 38 cm and 70 cm.   OPERATIVE FINDINGS: Muscle was healthy and viable.  OPERATIVE PROCEDURE: Patient was brought to the operating room after undergoing a regional anesthetic.  After adequate levels anesthesia were obtained a thigh tourniquet was placed and the lower extremity was prepped using DuraPrep draped into a sterile field. The foot was draped out of the sterile field with impervious stockinette.  A timeout was called and the tourniquet inflated.  A transverse skin incision was made 12 cm distal to the tibial tubercle, the incision curved proximally, and a large posterior flap was created.  The tibia was transected just proximal to the skin incision and beveled anteriorly.  The fibula was transected just proximal to the tibial incision.  The sciatic nerve was pulled cut and allowed to retract.  The vascular bundles were suture ligated with 2-0 silk.  The tourniquet was deflated and hemostasis obtained.  The Kerecis micro powder was applied to to the soft tissue over the distal tibia and fibula.  The distal tibia and fibula were then covered  with the 7 x 10 Kerecis sheet.  The deep and superficial fascial layers were closed using #1 Vicryl.  The skin was closed using staples.  The Prevena customizable dressing was applied this was overwrapped with the arthroform sponge.  Charlie Pitter was used to secure the sponges and the circumferential compression was secured to the skin with Dermatac.  This was connected to the wound VAC pump and had a good suction fit this was covered with a stump shrinker and a limb protector.  Patient was taken to the PACU in stable condition.   DISCHARGE PLANNING:  Antibiotic duration: 24-hour antibiotics  Weightbearing: Nonweightbearing on the operative extremity  Pain medication: Opioid pathway  Dressing care/ Wound VAC: Continue wound VAC with the Prevena plus pump at discharge for 1 week  Ambulatory devices: Walker or kneeling scooter  Discharge to: Discharge planning based on recommendations per physical therapy  Follow-up: In the office 1 week after discharge.

## 2021-08-29 NOTE — Progress Notes (Signed)
OT Cancellation Note  Patient Details Name: Randall Coleman MRN: [id number] DOB: Mar 30, 1950   Cancelled Treatment:    Reason Eval/Treat Not Completed: Patient at procedure or test/ unavailable.  Continue efforts as appropriate.  Jemina Scahill D Saaya Procell 08/29/2021, 8:00 AM 08/29/2021  RP, OTR/L  Acute Rehabilitation Services  Office:  [phone]

## 2021-08-29 NOTE — Progress Notes (Signed)
Pharmacy Antibiotic Note  Randall Coleman is a 71 y.o. male admitted on 08/25/2021 with  Wound infection on LLE .  Pharmacy has been consulted for Vancomycin/cefepime dosing. Pt also on Flagyl. Scr down to 0.64  Pt is s/p L BKA this morning.  Plan: Vancomycin '1000mg'$  q12hr (eAUC 522, SCr  1.14) Cefepime 2gm q8hr S/p L BKA this morning - since pt with source control, consider d/c abx.   Height: '6\' 2"'$  (188 cm) Weight: 83.9 kg (184 lb 15.5 oz) IBW/kg (Calculated) : 82.2  Temp (24hrs), Avg:98.2 F (36.8 C), Min:97.2 F (36.2 C), Max:98.9 F (37.2 C)  Recent Labs  Lab 08/25/21 1125 08/25/21 1139 08/25/21 1422 08/25/21 1656 08/25/21 1755 08/26/21 0657 08/26/21 1432 08/27/21 0401 08/28/21 0201 08/29/21 0121  WBC  --    < >  --   --   --  10.8* 10.0 7.6 7.3 6.6  CREATININE  --    < > 0.98 0.80 0.74  --   --  0.84 0.73 0.64  LATICACIDVEN 2.0*  --  2.3*  --   --   --   --   --   --   --    < > = values in this interval not displayed.     Estimated Creatinine Clearance: 101.3 mL/min (by C-G formula based on SCr of 0.64 mg/dL).    No Known Allergies  Thank you for allowing pharmacy to be a part of this patient's care.  Sherlon Handing, PharmD, BCPS Please see amion for complete clinical pharmacist phone list 08/29/2021 10:39 AM

## 2021-08-29 NOTE — Anesthesia Postprocedure Evaluation (Signed)
Anesthesia Post Note  Patient: Randall Coleman  Procedure(s) Performed: LEFT BELOW KNEE AMPUTATION (Left: Knee)     Patient location during evaluation: PACU Anesthesia Type: Regional Level of consciousness: awake Pain management: pain level controlled Vital Signs Assessment: post-procedure vital signs reviewed and stable Respiratory status: spontaneous breathing, nonlabored ventilation, respiratory function stable and patient connected to nasal cannula oxygen Cardiovascular status: stable and blood pressure returned to baseline Postop Assessment: no apparent nausea or vomiting Anesthetic complications: no   No notable events documented.  Last Vitals:  Vitals:   08/29/21 1100 08/29/21 1200  BP:    Pulse: 87 (!) 103  Resp: 18 10  Temp:    SpO2: 96% 96%    Last Pain:  Vitals:   08/29/21 1052  TempSrc:   PainSc: Asleep                 Randall Coleman P Randall Coleman

## 2021-08-29 NOTE — Anesthesia Procedure Notes (Signed)
Procedure Name: MAC Date/Time: 08/29/2021 7:53 AM  Performed by: Inda Coke, CRNAPre-anesthesia Checklist: Patient identified, Emergency Drugs available, Suction available, Timeout performed and Patient being monitored Patient Re-evaluated:Patient Re-evaluated prior to induction Oxygen Delivery Method: Simple face mask Induction Type: IV induction Dental Injury: Teeth and Oropharynx as per pre-operative assessment

## 2021-08-29 NOTE — Interval H&P Note (Signed)
History and Physical Interval Note:  08/29/2021 8:58 AM  Randall Coleman  has presented today for surgery, with the diagnosis of Osteomyelitis Left Foot.  The various methods of treatment have been discussed with the patient and family. After consideration of risks, benefits and other options for treatment, the patient has consented to  Procedure(s): LEFT BELOW KNEE AMPUTATION (Left) as a surgical intervention.  The patient's history has been reviewed, patient examined, no change in status, stable for surgery.  I have reviewed the patient's chart and labs.  Questions were answered to the patient's satisfaction.     [name]

## 2021-08-29 NOTE — Progress Notes (Signed)
Orthopedic Tech Progress Note Patient Details:  Randall Coleman [birth year] [id number]  Patient ID: Randall Coleman, male   DOB: May 30, 1950, 71 y.o.   MRN: [id number] Called [employer] to cancel order, as the outside vendor brace order was canceled.   Vernona Rieger 08/29/2021, 9:31 AM

## 2021-08-29 NOTE — Progress Notes (Signed)
PT Cancellation Note  Patient Details Name: Randall Coleman MRN: [id number] DOB: [birth year]   Cancelled Treatment:    Reason Eval/Treat Not Completed: Patient at procedure or test/unavailable (OR for L BKA)  Wyona Almas, PT, DPT Acute Rehabilitation Services Office [place] 08/29/2021, 7:51 AM

## 2021-08-29 NOTE — Transfer of Care (Signed)
Immediate Anesthesia Transfer of Care Note  Patient: Randall Coleman  Procedure(s) Performed: LEFT BELOW KNEE AMPUTATION (Left: Knee)  Patient Location: PACU  Anesthesia Type:MAC combined with regional for post-op pain  Level of Consciousness: awake, alert  and oriented  Airway & Oxygen Therapy: Patient Spontanous Breathing  Post-op Assessment: Report given to RN and Post -op Vital signs reviewed and stable  Post vital signs: Reviewed and stable  Last Vitals:  Vitals Value Taken Time  BP 95/63 08/29/21 0838  Temp    Pulse 73 08/29/21 0840  Resp 11 08/29/21 0840  SpO2 95 % 08/29/21 0840  Vitals shown include unvalidated device data.  Last Pain:  Vitals:   08/29/21 0709  TempSrc: Oral  PainSc:       Patients Stated Pain Goal: 0 (08/65/78 4696)  Complications: No notable events documented.

## 2021-08-29 NOTE — Progress Notes (Signed)
Orthopedic Tech Progress Note Patient Details:  Randall Coleman [birth year] [id number]  Patient ID: Randall Coleman, male   DOB: [birth year], 71 y.o.   MRN: [id number] I was called to reapply he [facility]. Karolee Stamps 08/29/2021, 11:11 PM

## 2021-08-29 NOTE — Progress Notes (Signed)
Subjective:  Doing well denies any chest pain or shortness of breath.  Tolerated left below-knee transtibial amputation earlier today.  Remains in A-fib with controlled ventricular response. Objective:  Vital Signs in the last 24 hours: Temp:  [97.2 F (36.2 C)-98.9 F (37.2 C)] 98 F (36.7 C) (07/01 0955) Pulse Rate:  [81-97] 81 (07/01 0955) Resp:  [14-19] 16 (07/01 0955) BP: (95-123)/(63-82) 109/74 (07/01 0955) SpO2:  [92 %-98 %] 98 % (07/01 0955) Weight:  [83.9 kg] 83.9 kg (07/01 0729)  Intake/Output from previous day: 06/30 0701 - 07/01 0700 In: 360 [P.O.:360] Out: 1800 [Urine:1800] Intake/Output from this shift: Total I/O In: 400 [I.V.:400] Out: 50 [Blood:50]  Physical Exam: Neck: no adenopathy, no carotid bruit, no JVD, and supple, symmetrical, trachea midline Lungs: clear to auscultation bilaterally Heart: irregularly irregular rhythm, S1, S2 normal, and 2/6 systolic murmur noted Abdomen: soft, non-tender; bowel sounds normal; no masses,  no organomegaly Extremities: Left transtibial amputation noted surgical dressing dry right leg no clubbing cyanosis or edema  Lab Results: Recent Labs    08/28/21 0201 08/29/21 0121  WBC 7.3 6.6  HGB 10.2* 11.0*  PLT 217 214   Recent Labs    08/28/21 0201 08/29/21 0121  NA 133* 135  K 4.3 4.0  CL 104 104  CO2 24 25  GLUCOSE 231* 318*  BUN 12 7*  CREATININE 0.73 0.64   No results for input(s): "TROPONINI" in the last 72 hours.  Invalid input(s): "CK", "MB" Hepatic Function Panel No results for input(s): "PROT", "ALBUMIN", "AST", "ALT", "ALKPHOS", "BILITOT", "BILIDIR", "IBILI" in the last 72 hours. No results for input(s): "CHOL" in the last 72 hours. No results for input(s): "PROTIME" in the last 72 hours.  Imaging: Imaging results have been reviewed and No results found.  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  LOS: 4 days    Charolette Forward 08/29/2021, 11:30 AM

## 2021-08-29 NOTE — Progress Notes (Signed)
[place] for Heparin Indication: atrial fibrillation and hx of VTE  No Known Allergies  Patient Measurements: Height: '6\' 2"'$  (188 cm) Weight: 83.9 kg (184 lb 15.5 oz) IBW/kg (Calculated) : 82.2 Heparin Dosing Weight: 84 kg  Vital Signs: Temp: 98 F (36.7 C) (07/01 0955) Temp Source: Oral (07/01 0709) BP: 109/74 (07/01 0955) Pulse Rate: 81 (07/01 0955)  Labs: Recent Labs    08/27/21 0401 08/27/21 1258 08/27/21 2250 08/28/21 0201 08/29/21 0121  HGB 11.1*  --   --  10.2* 11.0*  HCT 32.8*  --   --  31.0* 31.9*  PLT 240  --   --  217 214  HEPARINUNFRC 0.14* 0.32 0.22*  --  0.45  CREATININE 0.84  --   --  0.73 0.64     Estimated Creatinine Clearance: 101.3 mL/min (by C-G formula based on SCr of 0.64 mg/dL).   Assessment: 21 yom with a history of DVT and atrial fibrillation on Eliquis PTA (LD 6.26). Patient presented with pain, swelling and redness in his left foot. Heparin per pharmacy consult placed while Eliquis on hold.   Anticipated to have falsely high anti-Xa level secondary to DOAC use, however anti-Xa levels have remained undetectable since admission. Unsure if consistently taking DOAC PTA. Will DC aPTT monitoring and continue adjusting heparin levels based on anti-Xa instead.   Heparin currently off as pt to OR this morning - was turned off 6 hours pre-procedure (7/1 ~0100). Secure messaged Dr. Sharol Given who states it is ok to restart heparin tomorrow (7/2) a.m.  Goal of Therapy:  Heparin level 0.3-0.7 units/ml aPTT 66-102 seconds Monitor platelets by anticoagulation protocol: Yes   Plan:  Continue to hold heparin. CBC in a.m. Will f/u 7/2 a.m. for resuming heparin  Thank you for allowing pharmacy to be a part of this patient's care.  Sherlon Handing, PharmD, BCPS Please see amion for complete clinical pharmacist phone list 08/29/2021 10:31 AM

## 2021-08-29 NOTE — Progress Notes (Signed)
PROGRESS NOTE    Randall Coleman  BZJ:[id number] DOB: Aug 02, 1950 DOA: 08/25/2021 PCP: Merryl Hacker, No    Brief Narrative:  Randall Coleman is a 71 y.o. male with medical history of type 2 diabetes, hypertension, history of DVT on Eliquis presented to hospital with left foot infection with swelling and redness of the foot and had foul-smelling discharge and ambulatory dysfunction.  In the ED, patient was afebrile with mild leukocytosis and hyponatremia.  Lactate was elevated at 2.0.  Chest x-ray without any obvious infiltrate.  X-ray of the foot showed findings suggestive of osteomyelitis involving the distal third digit.  Patient was given 2 L of IV fluids, vancomycin and cefepime and Dr. Sharol Given was notified.  Patient was then admitted hospital for further evaluation and treatment.    During hospitalization, patient was seen by orthopedics and underwent transmetatarsal amputation on 08/26/2021 with findings of abscess in the hindfoot.  At this time, patient is on IV antibiotic and underwent below-knee amputation on 08/29/21.  Assessment and plan   Principal Problem:   sepsis secondary to toe osteomyelitis, left (HCC) Active Problems:   Atrial fibrillation with RVR ([place])   DKA, type 2 (HCC)   Essential hypertension   History of DVT of lower extremity   Cutaneous abscess of left foot   Sepsis ([place])   Protein-calorie malnutrition, severe   Hyponatremia   Hypomagnesemia   Sepsis secondary to 3rd toe osteomyelitis, left ([place]) status post left below-knee amputation Status post transmetatarsal amputation 08/26/2021 and underwent left below-knee amputation on 08/29/2021.  Patient received vanc/cefepime, flagyl during hospitalization.  At this time orthopedic recommends discontinuation of antibiotic given the margins were clear.  ABI of the left lower extremity was normal on 08/06/2020.  Orthopedics on board.  Will follow orthopedic recommendation.  We will get rehabilitation consultation.  Chronic atrial  fibrillation with RVR (HCC) Controlled at this time.  Initially needed Cardizem drip.  Now on oral Cardizem.  Heparin drip on hold at this time.  Will resume by AM.   On Eliquis as outpatient.  2D echocardiogram with LV ejection fraction of 40 to 45% with global hypokinesis.   Continue metoprolol.  Cardiology following.  DKA, type 2 with hyperglycemia Likely secondary to sepsis from left foot osteomyelitis.  Received IV fluids and insulin while in the hospital.  Latest hemoglobin A1c of 7.9 in 04/2021.  Repeat hemoglobin A1c at 10.7 on 08/25/2021.  Continue sliding scale and long-acting insulin at this time.  Latest POC glucose of 210 will increase long-acting insulin doses to 12 units and add mealtime insulin 2 units 3 times daily.  Monitor closely.   History of DVT of lower extremity On eliquis as outpatient.  Continue to hold heparin until AM.     Essential hypertension Not on medications at home.  Currently on Cardizem and metoprolol.  Blood pressure is stable.  History of cigarette smoking.  On nicotine patch.  Mild hyponatremia.  We will continue to monitor closely.  Latest sodium of 135  Hypomagnesemia.  Replenished.  Magnesium level of 1.7  Nutrition Status:Body mass index is 23.75 kg/m.  Present on admission. Nutrition Problem: Severe Malnutrition Etiology: chronic illness (uncontrolled DM with A1C=10.7) Signs/Symptoms: severe muscle depletion, severe fat depletion Interventions: Ensure Enlive (each supplement provides 350kcal and 20 grams of protein), MVI, Juven     DVT prophylaxis: SCD's Start: 08/29/21 0924 SCD's Start: 08/26/21 1056   Code Status:     Code Status: Full Code  Disposition: PT evaluation pending, likely CIR.  Status  is: Inpatient  Remains inpatient appropriate because: Status post amputation, awaiting physical therapy evaluation, possible need for rehabilitation   Family Communication:  Communicated with the patient's sister at bedside on  08/26/2021.  Consultants:  Orthopedics Dr Sharol Given.  Procedures:  Left transmetatarsal amputation on 08/26/2021. Left below-knee amputation on 08/29/2021  Antimicrobials:  Vancomycin, Flagyl, cefepime- dced on 7/1  Anti-infectives (From admission, onward)    Start     Dose/Rate Route Frequency Ordered Stop   08/29/21 0700  ceFAZolin (ANCEF) IVPB 2g/100 mL premix  Status:  Discontinued        2 g 200 mL/hr over 30 Minutes Intravenous On call to O.R. 08/29/21 8366 08/29/21 0921   08/26/21 0200  vancomycin (VANCOCIN) IVPB 1000 mg/200 mL premix        1,000 mg 200 mL/hr over 60 Minutes Intravenous Every 12 hours 08/25/21 1225     08/25/21 1430  metroNIDAZOLE (FLAGYL) IVPB 500 mg        500 mg 100 mL/hr over 60 Minutes Intravenous Every 12 hours 08/25/21 1422     08/25/21 1300  ceFEPIme (MAXIPIME) 2 g in sodium chloride 0.9 % 100 mL IVPB        2 g 200 mL/hr over 30 Minutes Intravenous Every 8 hours 08/25/21 1220     08/25/21 1230  vancomycin (VANCOREADY) IVPB 2000 mg/400 mL        2,000 mg 200 mL/hr over 120 Minutes Intravenous  Once 08/25/21 1220 08/25/21 1554      Subjective: Today, patient was seen and examined at bedside.  Patient seen after surgical intervention.  Denies any nausea vomiting fever or overt pain.  Objective: Vitals:   08/29/21 1000 08/29/21 1052 08/29/21 1100 08/29/21 1200  BP:      Pulse: 90 (!) 109 87 (!) 103  Resp:  (!) '23 18 10  '$ Temp:      TempSrc:      SpO2: 97% 98% 96% 96%  Weight:      Height:        Intake/Output Summary (Last 24 hours) at 08/29/2021 1420 Last data filed at 08/29/2021 0820 Gross per 24 hour  Intake 400 ml  Output 50 ml  Net 350 ml   Filed Weights   08/25/21 1438 08/29/21 0729  Weight: 83.9 kg 83.9 kg    Physical Examination: Body mass index is 23.75 kg/m.   General:  Average built, not in obvious distress, appears older than stated age HENT:   No scleral pallor or icterus noted. Oral mucosa is moist.  Chest:  Clear breath  sounds.  Diminished breath sounds bilaterally. No crackles or wheezes.  CVS: S1 &S2 heard. No murmur.  Regular rate and rhythm. Abdomen: Soft, nontender, nondistended.  Bowel sounds are heard.   Extremities: Status post left below-knee amputation with stump covered in place. Psych: Alert, awake and oriented, normal mood CNS:  No cranial nerve deficits.  Moves all extremities. Skin: Warm and dry.  Status post left below-knee amputation.  Data Reviewed:   CBC: Recent Labs  Lab 08/25/21 1139 08/26/21 0657 08/26/21 1432 08/27/21 0401 08/28/21 0201 08/29/21 0121  WBC 14.0* 10.8* 10.0 7.6 7.3 6.6  NEUTROABS 12.1*  --   --   --   --   --   HGB 14.3 11.4* 11.0* 11.1* 10.2* 11.0*  HCT 43.7 33.3* 31.9* 32.8* 31.0* 31.9*  MCV 84.4 83.3 82.2 83.0 82.9 81.8  PLT 292 215 250 240 217 294    Basic Metabolic Panel: Recent Labs  Lab 08/25/21 1422 08/25/21 1656 08/25/21 1755 08/27/21 0401 08/28/21 0201 08/29/21 0121  NA 129* 131* 129* 130* 133* 135  K 4.7 4.3 4.4 4.0 4.3 4.0  CL 93* 96* 96* 101 104 104  CO2 17* 22 21* '23 24 25  '$ GLUCOSE 383* 298* 287* 331* 231* 318*  BUN 27* 24* 25* 18 12 7*  CREATININE 0.98 0.80 0.74 0.84 0.73 0.64  CALCIUM 8.6* 8.3* 8.4* 8.1* 7.8* 8.1*  MG 1.8  --   --  1.6* 1.9 1.7    Liver Function Tests: Recent Labs  Lab 08/25/21 1139  AST 19  ALT 20  ALKPHOS 106  BILITOT 1.2  PROT 6.8  ALBUMIN 2.7*     Radiology Studies: No results found.    LOS: 4 days    Flora Lipps, MD [facility] Available via Epic secure chat 7am-7pm After these hours, please refer to coverage provider listed on [email or web address] 08/29/2021, 2:20 PM

## 2021-08-30 LAB — CULTURE, BLOOD (ROUTINE X 2)
Culture: NO GROWTH
Culture: NO GROWTH
Special Requests: ADEQUATE
Special Requests: ADEQUATE

## 2021-08-30 LAB — BASIC METABOLIC PANEL
Anion gap: 6 (ref 5–15)
BUN: 13 mg/dL (ref 8–23)
CO2: 25 mmol/L (ref 22–32)
Calcium: 8 mg/dL — ABNORMAL LOW (ref 8.9–10.3)
Chloride: 102 mmol/L (ref 98–111)
Creatinine, Ser: 0.59 mg/dL — ABNORMAL LOW (ref 0.61–1.24)
GFR, Estimated: >60 mL/min (ref >60)
Glucose, Bld: 308 mg/dL — ABNORMAL HIGH (ref 70–99)
Potassium: 4.4 mmol/L (ref 3.5–5.1)
Sodium: 133 mmol/L — ABNORMAL LOW (ref 135–145)

## 2021-08-30 LAB — MAGNESIUM: Magnesium: 1.6 mg/dL — ABNORMAL LOW (ref 1.7–2.4)

## 2021-08-30 LAB — GLUCOSE, CAPILLARY
Glucose-Capillary: 306 mg/dL — ABNORMAL HIGH (ref 70–99)
Glucose-Capillary: 222 mg/dL — ABNORMAL HIGH (ref 70–99)
Glucose-Capillary: 226 mg/dL — ABNORMAL HIGH (ref 70–99)
Glucose-Capillary: 233 mg/dL — ABNORMAL HIGH (ref 70–99)

## 2021-08-30 LAB — CBC
HCT: 31.3 % — ABNORMAL LOW (ref 39.0–52.0)
Hemoglobin: 10.7 g/dL — ABNORMAL LOW (ref 13.0–17.0)
MCH: 28.3 pg (ref 26.0–34.0)
MCHC: 34.2 g/dL (ref 30.0–36.0)
MCV: 82.8 fL (ref 80.0–100.0)
Platelets: 248 10*3/uL (ref 150–400)
RBC: 3.78 MIL/uL — ABNORMAL LOW (ref 4.22–5.81)
RDW: 12.5 % (ref 11.5–15.5)
WBC: 8.5 10*3/uL (ref 4.0–10.5)
nRBC: 0 % (ref 0.0–0.2)

## 2021-08-30 LAB — HEPARIN LEVEL (UNFRACTIONATED): Heparin Unfractionated: 0.26 IU/mL — ABNORMAL LOW (ref 0.30–0.70)

## 2021-08-30 MED ORDER — SACUBITRIL-VALSARTAN 24-26 MG PO TABS
1.0000 | ORAL_TABLET | Freq: Two times a day (BID) | ORAL | Status: DC
  Administered 2021-08-30 – 2021-09-01 (×5): 1 via ORAL
  Filled 2021-08-30 (×6): qty 1

## 2021-08-30 MED ORDER — INSULIN DETEMIR 100 UNIT/ML ~~LOC~~ SOLN
10.0000 [IU] | Freq: Two times a day (BID) | SUBCUTANEOUS | Status: DC
  Administered 2021-08-30 – 2021-08-31 (×2): 10 [IU] via SUBCUTANEOUS
  Filled 2021-08-30 (×3): qty 0.1

## 2021-08-30 MED ORDER — HEPARIN (PORCINE) 25000 UT/250ML-% IV SOLN
1950.0000 [IU]/h | INTRAVENOUS | Status: DC
  Administered 2021-08-30: 1950 [IU]/h via INTRAVENOUS
  Administered 2021-08-30: 1900 [IU]/h via INTRAVENOUS
  Filled 2021-08-30 (×2): qty 250

## 2021-08-30 NOTE — Progress Notes (Signed)
[place] for Heparin Indication: atrial fibrillation and hx of VTE  No Known Allergies  Patient Measurements: Height: '6\' 2"'$  (188 cm) Weight: 83.9 kg (184 lb 15.5 oz) IBW/kg (Calculated) : 82.2 Heparin Dosing Weight: 84 kg  Vital Signs: Temp: 98 F (36.7 C) (07/02 1131) BP: 128/82 (07/02 1131) Pulse Rate: 74 (07/02 1131)  Labs: Recent Labs    08/27/21 2250 08/28/21 0201 08/28/21 0201 08/29/21 0121 08/30/21 0049 08/30/21 1520  HGB  --  10.2*   < > 11.0* 10.7*  --   HCT  --  31.0*  --  31.9* 31.3*  --   PLT  --  217  --  214 248  --   HEPARINUNFRC 0.22*  --   --  0.45  --  0.26*  CREATININE  --  0.73  --  0.64 0.59*  --    < > = values in this interval not displayed.     Estimated Creatinine Clearance: 101.3 mL/min (A) (by C-G formula based on SCr of 0.59 mg/dL (L)).   Assessment: 29 yom with a history of DVT and atrial fibrillation on Eliquis PTA (LD 6.26). Patient presented with pain, swelling and redness in his left foot. Heparin per pharmacy consult placed while Eliquis on hold.   Anticipated to have falsely high anti-Xa level secondary to DOAC use, however anti-Xa levels have remained undetectable since admission. Unsure if consistently taking DOAC PTA.  Heparin off since 7/1 0100 for currently off as pt to OR 7/1 for L BKA. Resumed heparin this AM at 9 AM.  Heparin level this afternoon just slightly below goal at 0.26 on heparin infusion at 1900 units/hr.  No overt bleeding or complications noted.  Goal of Therapy:  Heparin level 0.3-0.7 units/ml (consider 0.3-0.5 with recent surgery) Monitor platelets by anticoagulation protocol: Yes   Plan:  Increase IV heparin to 1950 units/hr. Will f/u 8 hr heparin level F/u when able to transition back to apixaban  Thank you for allowing pharmacy to be a part of this patient's care.  [place] Crane, Roylene Reason, BCCP Clinical Pharmacist  08/30/2021 4:50 PM   [facility] pharmacy  phone numbers are listed on [email or web address]

## 2021-08-30 NOTE — Progress Notes (Signed)
Routine EKG wasn't completed reported to night shift to complete.

## 2021-08-30 NOTE — Progress Notes (Signed)
Inpatient Diabetes Program Recommendations  AACE/ADA: New Consensus Statement on Inpatient Glycemic Control (2015)  Target Ranges:  Prepandial:   less than 140 mg/dL      Peak postprandial:   less than 180 mg/dL (1-2 hours)      Critically ill patients:  140 - 180 mg/dL   Lab Results  Component Value Date   GLUCAP 306 (H) 08/30/2021   HGBA1C 10.7 (H) 08/25/2021    Review of Glycemic Control  Latest Reference Range & Units 08/29/21 15:22 08/29/21 22:19 08/30/21 08:06  Glucose-Capillary 70 - 99 mg/dL 248 (H) 239 (H) 306 (H)  Diabetes history: DM2 Outpatient Diabetes medications: Metformin 1000 mg BID Current orders for Inpatient glycemic control: Novolog 0-9 units tid Levemir 3 units qhs   A1c 10.7% on 6/27   Inpatient Diabetes Program Recommendations:    Blood sugars remain elevated.  Consider increasing Levemir to 10 units bid.  Will follow.  Thanks,  Adah Perl, RN, BC-ADM Inpatient Diabetes Coordinator Pager [phone]  (8a-5p)

## 2021-08-30 NOTE — Progress Notes (Signed)
Subjective:  Doing well denies any chest pain or shortness of breath.  Denies palpitations noted to be in sinus rhythm on the monitor  Objective:  Vital Signs in the last 24 hours: Temp:  [98 F (36.7 C)-99 F (37.2 C)] 98 F (36.7 C) (07/02 1131) Pulse Rate:  [41-129] 74 (07/02 1131) Resp:  [16-24] 20 (07/02 1131) BP: (104-152)/(62-86) 128/82 (07/02 1131) SpO2:  [72 %-98 %] 98 % (07/02 1131)  Intake/Output from previous day: 07/01 0701 - 07/02 0700 In: 640 [P.O.:240; I.V.:400] Out: 900 [Urine:850; Blood:50] Intake/Output from this shift: No intake/output data recorded.  Physical Exam: Neck: no adenopathy, no carotid bruit, no JVD, and supple, symmetrical, trachea midline Lungs: clear to auscultation bilaterally Heart: regular rate and rhythm, S1, S2 normal, and 2/6 systolic murmur noted Abdomen: soft, non-tender; bowel sounds normal; no masses,  no organomegaly Extremities: extremities normal, atraumatic, no cyanosis or edema Left transtibial amputation noted dressing dry Lab Results: Recent Labs    08/29/21 0121 08/30/21 0049  WBC 6.6 8.5  HGB 11.0* 10.7*  PLT 214 248   Recent Labs    08/29/21 0121 08/30/21 0049  NA 135 133*  K 4.0 4.4  CL 104 102  CO2 25 25  GLUCOSE 318* 308*  BUN 7* 13  CREATININE 0.64 0.59*   No results for input(s): "TROPONINI" in the last 72 hours.  Invalid input(s): "CK", "MB" Hepatic Function Panel No results for input(s): "PROT", "ALBUMIN", "AST", "ALT", "ALKPHOS", "BILITOT", "BILIDIR", "IBILI" in the last 72 hours. No results for input(s): "CHOL" in the last 72 hours. No results for input(s): "PROTIME" in the last 72 hours.  Imaging: Imaging results have been reviewed  Cardiac Studies:  Assessment/Plan:  Peripheral vascular disease with osteomyelitis status post left BKA transtibial amputation  Status post atrial fibrillation on chronic anticoagulation  Hypertension Diabetes mellitus History of DVT in the past Plan Start  low-dose Entresto in view of depressed LV systolic function Okay to switch to Eliquis if okay with surgery Check EKG  LOS: 5 days    Randall Coleman 08/30/2021, 12:21 PM

## 2021-08-30 NOTE — Evaluation (Signed)
Physical Therapy Re-Evaluation Patient Details Name: Maston Wight MRN: [id number] DOB: 04-May-1950 Today's Date: 08/30/2021  History of Present Illness  Pt is a 71 y.o. 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 Significant medical history: T2DM, HTN,  hx of DVT on eliquis.  Clinical Impression  Pt re-evaluated s/p L BKA. Pt reporting phantom pain, however, agreeable for therapy session. Pt requiring min guard assist for bed mobility and two person moderate assist for stand pivot transfers using RW. Displays functional weakness, impaired standing balance, and pain. Suspect good progress given age, PLOF, and motivation and suspect pt will be able to achieve supervision-modI goals. Highly recommend AIR to address deficits, maximize functional mobility and decrease caregiver burden.     Recommendations for follow up therapy are one component of a multi-disciplinary discharge planning process, led by the attending physician.  Recommendations may be updated based on patient status, additional functional criteria and insurance authorization.  Follow Up Recommendations Acute inpatient rehab (3hours/day)      Assistance Recommended at Discharge Frequent or constant Supervision/Assistance  Patient can return home with the following  A lot of help with walking and/or transfers;A lot of help with bathing/dressing/bathroom    Equipment Recommendations BSC/3in1;Wheelchair (measurements PT);Wheelchair cushion (measurements PT)  Recommendations for Other Services  Rehab consult    Functional Status Assessment Patient has had a recent decline in their functional status and demonstrates the ability to make significant improvements in function in a reasonable and predictable amount of time.     Precautions / Restrictions Precautions Precautions: Fall Required Braces or Orthoses: Other Brace Other Brace: L limb protector Restrictions Weight Bearing Restrictions: Yes LLE Weight Bearing: Non  weight bearing      Mobility  Bed Mobility Overal bed mobility: Needs Assistance Bed Mobility: Supine to Sit     Supine to sit: Min guard     General bed mobility comments: Close min guard for safety, exiting towards left side of bed    Transfers Overall transfer level: Needs assistance Equipment used: Rolling walker (2 wheels) Transfers: Sit to/from Stand, Bed to chair/wheelchair/BSC Sit to Stand: +2 physical assistance, Mod assist Stand pivot transfers: Mod assist, +2 physical assistance         General transfer comment: Pt requiring modA + 2 to power up to stand and pivot towards right to chair. Cues for sequencing/technique, increased trunk flexion, adequate right foot clearance but worsens when fatigued    Ambulation/Gait                  Stairs            Wheelchair Mobility    Modified Rankin (Stroke Patients Only)       Balance Overall balance assessment: Needs assistance Sitting-balance support: Feet supported Sitting balance-Leahy Scale: Good     Standing balance support: Reliant on assistive device for balance Standing balance-Leahy Scale: Poor                               Pertinent Vitals/Pain Pain Assessment Pain Assessment: 0-10 Pain Score: 7  Pain Location: phantom pain Pain Descriptors / Indicators: Operative site guarding, Grimacing Pain Intervention(s): Limited activity within patient's tolerance, Monitored during session, Premedicated before session    Home Living Family/patient expects to be discharged to:: Private residence Living Arrangements: Alone   Type of Home: Apartment Home Access: Level entry       Home Layout: One level Home Equipment:  Rolling Walker (2 wheels) Additional Comments: Plans to move to [place] to live near sister    Prior Function Prior Level of Function : Independent/Modified Independent;Driving             Mobility Comments: does not use AD       Hand Dominance    Dominant Hand: Right    Extremity/Trunk Assessment   Upper Extremity Assessment Upper Extremity Assessment: Defer to OT evaluation    Lower Extremity Assessment Lower Extremity Assessment: LLE deficits/detail LLE Deficits / Details: s/p BKA. Able to perform quad set and SLR    Cervical / Trunk Assessment Cervical / Trunk Assessment: Kyphotic  Communication   Communication: No difficulties  Cognition Arousal/Alertness: Awake/alert Behavior During Therapy: WFL for tasks assessed/performed Overall Cognitive Status: History of cognitive impairments - at baseline                                 General Comments: STM deficits noted with medication timing        General Comments      Exercises Other Exercises Other Exercises: Education provided on quad sets, no pillow underneath knee, limb protector use   Assessment/Plan    PT Assessment Patient needs continued PT services  PT Problem List Decreased strength;Decreased activity tolerance;Decreased balance;Pain;Decreased safety awareness;Decreased knowledge of precautions;Decreased mobility;Decreased cognition       PT Treatment Interventions DME instruction;Gait training;Stair training;Functional mobility training;Therapeutic activities;Therapeutic exercise;Balance training;Neuromuscular re-education;Cognitive remediation;Patient/family education    PT Goals (Current goals can be found in the Care Plan section)  Acute Rehab PT Goals Patient Stated Goal: less pain PT Goal Formulation: With patient Time For Goal Achievement: 09/13/21 Potential to Achieve Goals: Good    Frequency Min 3X/week     Co-evaluation               AM-PAC PT "6 Clicks" Mobility  Outcome Measure Help needed turning from your back to your side while in a flat bed without using bedrails?: A Little Help needed moving from lying on your back to sitting on the side of a flat bed without using bedrails?: A Little Help needed moving  to and from a bed to a chair (including a wheelchair)?: A Lot Help needed standing up from a chair using your arms (e.g., wheelchair or bedside chair)?: A Lot Help needed to walk in hospital room?: Total Help needed climbing 3-5 steps with a railing? : Total 6 Click Score: 12    End of Session Equipment Utilized During Treatment: Gait belt;Other (comment) (limb protector) Activity Tolerance: Patient tolerated treatment well Patient left: in chair;with call bell/phone within reach;with chair alarm set Nurse Communication: Mobility status PT Visit Diagnosis: Unsteadiness on feet (R26.81);Other abnormalities of gait and mobility (R26.89);Muscle weakness (generalized) (M62.81);Pain Pain - Right/Left: Left Pain - part of body:  (residual limb)    Time: 2595-6387 PT Time Calculation (min) (ACUTE ONLY): 27 min   Charges:   PT Evaluation $PT Re-evaluation: 1 Re-eval PT Treatments $Therapeutic Activity: 8-22 mins        Wyona Almas, PT, DPT Acute Rehabilitation Services Office [phone]   Deno Etienne 08/30/2021, 10:15 AM

## 2021-08-30 NOTE — Progress Notes (Signed)
PROGRESS NOTE    Randall Coleman  POE:[id number] DOB: [birth year] DOA: 08/25/2021 PCP: Merryl Hacker, No    Brief Narrative:  Randall Coleman is a 71 y.o. male with medical history of type 2 diabetes, hypertension, history of DVT on Eliquis presented to hospital with left foot infection with swelling and redness of the foot and had foul-smelling discharge and ambulatory dysfunction.  In the ED, patient was afebrile with mild leukocytosis and hyponatremia.  Lactate was elevated at 2.0.  Chest x-ray without any obvious infiltrate.  X-ray of the foot showed findings suggestive of osteomyelitis involving the distal third digit.  Patient was given 2 L of IV fluids, vancomycin and cefepime and Dr. Sharol Given was notified.  Patient was then admitted hospital for further evaluation and treatment.    During hospitalization, patient was seen by orthopedics and underwent transmetatarsal amputation on 08/26/2021 with findings of abscess in the hindfoot.  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  Patient had initially received vanc/cefepime, flagyl which has been discontinued due to margins being clear as per orthopedic recommendation.  ABI of the left lower extremity was normal on 08/06/2020.  PT OT evaluation pending.  Might need CIR placement.  Chronic atrial fibrillation with RVR (HCC) Controlled at this  time.  Initially needed Cardizem drip.  Now on oral Cardizem.  Heparin drip has been initiated.  On Eliquis as outpatient.  2D echocardiogram with LV ejection fraction of 40 to 45% with global hypokinesis.   Continue metoprolol.  Cardiology following.  Will transition to Eliquis if okay with surgery  DKA, type 2 with hyperglycemia Likely secondary to sepsis from left foot osteomyelitis.  Received IV fluids and insulin while in the hospital.  Latest hemoglobin A1c of 7.9 in 04/2021.  Repeat hemoglobin A1c at 10.7 on 08/25/2021.  Dose of insulin has been increased including addition of mealtime insulin yesterday.  Seen by diabetic coordinator.  We will change the long-acting insulin twice a day.   History of DVT of lower extremity On eliquis as outpatient.  Heparin has been reinitiated this morning.    Essential hypertension Not on medications at home.  Currently on Cardizem and metoprolol.  Blood pressure is stable.  History of cigarette smoking.  On nicotine patch.  Mild hyponatremia.  We will continue to monitor closely.  Latest sodium of 133  Hypomagnesemia.  Magnesium level of 1.6.  Continue magnesium oxide  Nutrition Status:Body mass index is 23.75 kg/m.  Present on admission. Nutrition Problem: Severe Malnutrition Etiology: chronic illness (uncontrolled DM with A1C=10.7) Signs/Symptoms: severe muscle depletion, severe fat depletion Interventions: Ensure Enlive (each supplement provides 350kcal and 20 grams of protein), MVI, Juven     DVT prophylaxis: SCD's Start: 08/29/21 0924 SCD's Start: 08/26/21 1056   Code Status:     Code Status: Full Code  Disposition: PT evaluation pending, likely CIR.  Status is: Inpatient  Remains inpatient appropriate because: Status post amputation,  awaiting physical therapy evaluation, possible need for rehabilitation   Family Communication:  Communicated with the patient's sister at bedside on 08/26/2021.  Consultants:  Orthopedics Dr  Sharol Given.  Procedures:  Left transmetatarsal amputation on 08/26/2021. Left below-knee amputation on 08/29/2021  Antimicrobials:  Vancomycin, Flagyl, cefepime- dced on 7/1   Subjective: Today, patient was seen and examined at bedside complains of little more pain over the amputation site.   Objective: Vitals:   08/29/21 1920 08/29/21 2300 08/30/21 0300 08/30/21 0812  BP: 134/73 140/71 (!) 144/78 (!) 152/86  Pulse: 88 97 87 91  Resp: '20 20 18 19  '$ Temp: 99 F (37.2 C) 99 F (37.2 C) 98.8 F (37.1 C) 99 F (37.2 C)  TempSrc: Oral Oral Oral   SpO2: 97% 92% 91% 95%  Weight:      Height:        Intake/Output Summary (Last 24 hours) at 08/30/2021 1042 Last data filed at 08/29/2021 1921 Gross per 24 hour  Intake 240 ml  Output 850 ml  Net -610 ml   Filed Weights   08/25/21 1438 08/29/21 0729  Weight: 83.9 kg 83.9 kg    Physical Examination: Body mass index is 23.75 kg/m.   General:  Average built, not in obvious distress, appears older than stated age, HENT:   No scleral pallor or icterus noted. Oral mucosa is moist.  Chest:  Clear breath sounds.  Diminished breath sounds bilaterally. No crackles or wheezes.  CVS: S1 &S2 heard. No murmur.  Regular rate and rhythm. Abdomen: Soft, nontender, nondistended.  Bowel sounds are heard.   Extremities: Below-knee amputation with stump covered. Psych: Alert, awake and oriented, normal mood CNS:  No cranial nerve deficits.  Power equal in all extremities.   Skin: Warm and dry.  Status post left below-knee amputation.  Data Reviewed:   CBC: Recent Labs  Lab 08/25/21 1139 08/26/21 0657 08/26/21 1432 08/27/21 0401 08/28/21 0201 08/29/21 0121 08/30/21 0049  WBC 14.0*   < > 10.0 7.6 7.3 6.6 8.5  NEUTROABS 12.1*  --   --   --   --   --   --   HGB 14.3   < > 11.0* 11.1* 10.2* 11.0* 10.7*  HCT 43.7   < > 31.9* 32.8* 31.0* 31.9* 31.3*  MCV 84.4   < > 82.2 83.0 82.9 81.8 82.8  PLT 292   < > 250 240 217 214 248   < > = values in this  interval not displayed.    Basic Metabolic Panel: Recent Labs  Lab 08/25/21 1422 08/25/21 1656 08/25/21 1755 08/27/21 0401 08/28/21 0201 08/29/21 0121 08/30/21 0049  NA 129*   < > 129* 130* 133* 135 133*  K 4.7   < > 4.4 4.0 4.3 4.0 4.4  CL 93*   < > 96* 101 104 104 102  CO2 17*   < > 21* '23 24 25 25  '$ GLUCOSE 383*   < > 287* 331* 231* 318* 308*  BUN 27*   < > 25* 18 12 7* 13  CREATININE 0.98   < > 0.74 0.84 0.73 0.64 0.59*  CALCIUM 8.6*   < > 8.4* 8.1* 7.8* 8.1* 8.0*  MG 1.8  --   --  1.6* 1.9 1.7 1.6*   < > = values in this interval not displayed.    Liver Function Tests: Recent Labs  Lab 08/25/21 1139  AST 19  ALT 20  ALKPHOS 106  BILITOT 1.2  PROT 6.8  ALBUMIN  2.7*     Radiology Studies: No results found.    LOS: 5 days    Flora Lipps, MD [facility] Available via Epic secure chat 7am-7pm After these hours, please refer to coverage provider listed on [email or web address] 08/30/2021, 10:42 AM

## 2021-08-30 NOTE — Progress Notes (Signed)
[facility] for Heparin Indication: atrial fibrillation and hx of VTE  No Known Allergies  Patient Measurements: Height: '6\' 2"'$  (188 cm) Weight: 83.9 kg (184 lb 15.5 oz) IBW/kg (Calculated) : 82.2 Heparin Dosing Weight: 84 kg  Vital Signs: Temp: 98.8 F (37.1 C) (07/02 0300) Temp Source: Oral (07/02 0300) BP: 144/78 (07/02 0300) Pulse Rate: 87 (07/02 0300)  Labs: Recent Labs    08/27/21 1258 08/27/21 2250 08/28/21 0201 08/28/21 0201 08/29/21 0121 08/30/21 0049  HGB  --   --  10.2*   < > 11.0* 10.7*  HCT  --   --  31.0*  --  31.9* 31.3*  PLT  --   --  217  --  214 248  HEPARINUNFRC 0.32 0.22*  --   --  0.45  --   CREATININE  --   --  0.73  --  0.64 0.59*   < > = values in this interval not displayed.     Estimated Creatinine Clearance: 101.3 mL/min (A) (by C-G formula based on SCr of 0.59 mg/dL (L)).   Assessment: 23 yom with a history of DVT and atrial fibrillation on Eliquis PTA (LD 6.26). Patient presented with pain, swelling and redness in his left foot. Heparin per pharmacy consult placed while Eliquis on hold.   Anticipated to have falsely high anti-Xa level secondary to DOAC use, however anti-Xa levels have remained undetectable since admission. Unsure if consistently taking DOAC PTA.  Heparin off since 7/1 0100 for currently off as pt to OR 7/1 for L BKA. Okay to restart heparin 7/2 a.m. per Dr. Sharol Given. Hgb 10.7 (stable). Heparin level was therapeutic on 2000 units/hr - will start slightly lower with recent surgery.  Goal of Therapy:  Heparin level 0.3-0.7 units/ml (consider 0.3-0.5 with recent surgery) Monitor platelets by anticoagulation protocol: Yes   Plan:  Restart heparin 1800 units/hr Will f/u 8 hr heparin level F/u when able to transition back to apixaban  Thank you for allowing pharmacy to be a part of this patient's care.  Sherlon Handing, PharmD, BCPS Please see amion for complete clinical pharmacist phone  list 08/30/2021 7:24 AM

## 2021-08-30 NOTE — Evaluation (Signed)
Occupational Therapy Evaluation Patient Details Name: Randall Coleman MRN: [id number] DOB: 11-21-1950 Today's Date: 08/30/2021   History of Present Illness Pt is a 71 y.o. 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 Significant medical history: T2DM, HTN,  hx of DVT on eliquis.   Clinical Impression   Randall Coleman was re-evaluated s/p the above amputation. Overall he has functional limitations due to pain, impaired balance, impaired cognition, decreased activity tolerance and generalized weakness. He was able to stand from the chair with mod A but unable to pivot from this position; max A squat pivot transfer completed for chair>bed. Due to deficits listed below, he requires up to max A for LB ADLs and set up A for UB ADLs. He will benefit from continued OT acutely. Recommend d/c ti AIR for maximal functional progress prior to d/c home.      Recommendations for follow up therapy are one component of a multi-disciplinary discharge planning process, led by the attending physician.  Recommendations may be updated based on patient status, additional functional criteria and insurance authorization.   Follow Up Recommendations  Acute inpatient rehab (3hours/day)    Assistance Recommended at Discharge Frequent or constant Supervision/Assistance  Patient can return home with the following A lot of help with bathing/dressing/bathroom;A lot of help with walking and/or transfers;Help with stairs or ramp for entrance;Assist for transportation;Assistance with cooking/housework    Functional Status Assessment  Patient has had a recent decline in their functional status and demonstrates the ability to make significant improvements in function in a reasonable and predictable amount of time.  Equipment Recommendations  BSC/3in1    Recommendations for Other Services Rehab consult     Precautions / Restrictions Precautions Precautions: Fall Precaution Comments: wound vac Required Braces or Orthoses:  Other Brace Other Brace: L limb protector Restrictions Weight Bearing Restrictions: Yes LLE Weight Bearing: Non weight bearing      Mobility Bed Mobility Overal bed mobility: Needs Assistance Bed Mobility: Sit to Supine       Sit to supine: Min guard        Transfers Overall transfer level: Needs assistance Equipment used: 1 person hand held assist Transfers: Sit to/from Stand, Bed to chair/wheelchair/BSC Sit to Stand: Mod assist   Squat pivot transfers: Max assist       General transfer comment: mod A to sit<>stand, unable to pivot in standing. squat pivot wtih max A for cues and assist      Balance Overall balance assessment: Needs assistance Sitting-balance support: Feet supported Sitting balance-Leahy Scale: Good     Standing balance support: Reliant on assistive device for balance Standing balance-Leahy Scale: Poor                             ADL either performed or assessed with clinical judgement   ADL Overall ADL's : Needs assistance/impaired Eating/Feeding: Independent;Sitting   Grooming: Set up;Sitting   Upper Body Bathing: Set up;Sitting   Lower Body Bathing: Maximal assistance;Sitting/lateral leans   Upper Body Dressing : Set up;Sitting   Lower Body Dressing: Maximal assistance;Sit to/from stand   Toilet Transfer: Maximal assistance;Stand-pivot;BSC/3in1   Toileting- Clothing Manipulation and Hygiene: Maximal assistance;Sit to/from stand       Functional mobility during ADLs: Maximal assistance General ADL Comments: limited by pain and poor command following. squat pivot transfer from chair>bed with 1x sit<>stand     Vision Baseline Vision/History: 1 Wears glasses Vision Assessment?: No apparent visual deficits  Perception     Praxis      Pertinent Vitals/Pain Pain Assessment Pain Assessment: Faces Faces Pain Scale: Hurts even more Pain Location: residual limb Pain Descriptors / Indicators: Operative site  guarding, Grimacing Pain Intervention(s): Limited activity within patient's tolerance, Monitored during session     Hand Dominance Right   Extremity/Trunk Assessment Upper Extremity Assessment Upper Extremity Assessment: Generalized weakness   Lower Extremity Assessment Lower Extremity Assessment: Defer to PT evaluation LLE Deficits / Details: s/p BKA. Able to perform quad set and SLR   Cervical / Trunk Assessment Cervical / Trunk Assessment: Kyphotic   Communication Communication Communication: No difficulties   Cognition Arousal/Alertness: Awake/alert Behavior During Therapy: WFL for tasks assessed/performed Overall Cognitive Status: Impaired/Different from baseline Area of Impairment: Memory, Safety/judgement, Awareness, Problem solving                   Current Attention Level: Selective Memory: Decreased short-term memory Following Commands: Follows one step commands with increased time Safety/Judgement: Decreased awareness of safety   Problem Solving: Slow processing, Requires verbal cues General Comments: STM deficits noted, poor problem solving and awareness of safety. needs step by step verbal cues     General Comments  VSS on RA    Exercises     Shoulder Instructions      Home Living Family/patient expects to be discharged to:: Private residence Living Arrangements: Alone   Type of Home: Apartment Home Access: Level entry     Home Layout: One level     Bathroom Shower/Tub: Tub/shower unit (with a walk-in cut out)   Bathroom Toilet: Standard Bathroom Accessibility: Yes How Accessible: Accessible via walker Home Equipment: [place] (2 wheels)   Additional Comments: Plans to move to [place] to live near sister      Prior Functioning/Environment Prior Level of Function : Independent/Modified Independent;Driving             Mobility Comments: does not use AD ADLs Comments: Continues to drive. Pt independent with ADL's and  IADL's as well as medications and finances.        OT Problem List: Decreased strength;Impaired balance (sitting and/or standing);Decreased knowledge of precautions;Decreased knowledge of use of DME or AE;Decreased safety awareness;Decreased cognition;Pain;Increased edema      OT Treatment/Interventions: Self-care/ADL training;Therapeutic activities;Patient/family education;Balance training;DME and/or AE instruction    OT Goals(Current goals can be found in the care plan section) Acute Rehab OT Goals Patient Stated Goal: move to [facility], closer to family OT Goal Formulation: With patient Time For Goal Achievement: 09/13/21 Potential to Achieve Goals: Good  OT Frequency: Min 2X/week       AM-PAC OT "6 Clicks" Daily Activity     Outcome Measure Help from another person eating meals?: None Help from another person taking care of personal grooming?: A Little Help from another person toileting, which includes using toliet, bedpan, or urinal?: A Lot Help from another person bathing (including washing, rinsing, drying)?: A Lot Help from another person to put on and taking off regular upper body clothing?: A Little Help from another person to put on and taking off regular lower body clothing?: A Lot 6 Click Score: 16   End of Session Equipment Utilized During Treatment: Gait belt Nurse Communication: Mobility status  Activity Tolerance: Patient tolerated treatment well Patient left: in bed;with bed alarm set;with call bell/phone within reach  OT Visit Diagnosis: Unsteadiness on feet (R26.81);Muscle weakness (generalized) (M62.81);Other symptoms and signs involving cognitive function;Pain Pain - Right/Left: Left Pain - part of  body: Ankle and joints of foot                Time: 9718-2099 OT Time Calculation (min): 17 min Charges:  OT General Charges $OT Visit: 1 Visit OT Evaluation $OT Eval Moderate Complexity: 1 Mod   Kirsten Spearing A Chaka Boyson 08/30/2021, 11:06 AM

## 2021-08-31 ENCOUNTER — Other Ambulatory Visit (HOSPITAL_COMMUNITY): Payer: Self-pay

## 2021-08-31 ENCOUNTER — Encounter (HOSPITAL_COMMUNITY): Payer: Self-pay | Admitting: Orthopedic Surgery

## 2021-08-31 DIAGNOSIS — E871 Hypo-osmolality and hyponatremia: Secondary | ICD-10-CM

## 2021-08-31 DIAGNOSIS — E43 Unspecified severe protein-calorie malnutrition: Secondary | ICD-10-CM

## 2021-08-31 LAB — CBC
HCT: 31.5 % — ABNORMAL LOW (ref 39.0–52.0)
Hemoglobin: 10.5 g/dL — ABNORMAL LOW (ref 13.0–17.0)
MCH: 27.9 pg (ref 26.0–34.0)
MCHC: 33.3 g/dL (ref 30.0–36.0)
MCV: 83.6 fL (ref 80.0–100.0)
Platelets: 243 10*3/uL (ref 150–400)
RBC: 3.77 MIL/uL — ABNORMAL LOW (ref 4.22–5.81)
RDW: 12.6 % (ref 11.5–15.5)
WBC: 7.4 10*3/uL (ref 4.0–10.5)
nRBC: 0 % (ref 0.0–0.2)

## 2021-08-31 LAB — HEPARIN LEVEL (UNFRACTIONATED): Heparin Unfractionated: 0.35 IU/mL (ref 0.30–0.70)

## 2021-08-31 LAB — GLUCOSE, CAPILLARY
Glucose-Capillary: 267 mg/dL — ABNORMAL HIGH (ref 70–99)
Glucose-Capillary: 340 mg/dL — ABNORMAL HIGH (ref 70–99)
Glucose-Capillary: 211 mg/dL — ABNORMAL HIGH (ref 70–99)
Glucose-Capillary: 162 mg/dL — ABNORMAL HIGH (ref 70–99)

## 2021-08-31 MED ORDER — ADULT MULTIVITAMIN W/MINERALS CH
1.0000 | ORAL_TABLET | Freq: Every morning | ORAL | Status: DC
  Administered 2021-08-31 – 2021-09-01 (×2): 1 via ORAL
  Filled 2021-08-31 (×2): qty 1

## 2021-08-31 MED ORDER — INSULIN DETEMIR 100 UNIT/ML ~~LOC~~ SOLN
15.0000 [IU] | Freq: Two times a day (BID) | SUBCUTANEOUS | Status: DC
  Administered 2021-08-31 – 2021-09-01 (×2): 15 [IU] via SUBCUTANEOUS
  Filled 2021-08-31 (×3): qty 0.15

## 2021-08-31 MED ORDER — APIXABAN 5 MG PO TABS
5.0000 mg | ORAL_TABLET | Freq: Two times a day (BID) | ORAL | Status: DC
  Administered 2021-08-31 – 2021-09-01 (×3): 5 mg via ORAL
  Filled 2021-08-31 (×3): qty 1

## 2021-08-31 MED ORDER — INSULIN ASPART 100 UNIT/ML IJ SOLN
4.0000 [IU] | Freq: Three times a day (TID) | INTRAMUSCULAR | Status: DC
  Administered 2021-08-31 – 2021-09-01 (×3): 4 [IU] via SUBCUTANEOUS

## 2021-08-31 NOTE — Plan of Care (Signed)
  Problem: Education: Goal: Knowledge of disease or condition will improve Outcome: Progressing Goal: Understanding of medication regimen will improve Outcome: Progressing Goal: Individualized Educational Video(s) Outcome: Progressing   Problem: Activity: Goal: Ability to tolerate increased activity will improve Outcome: Progressing   Problem: Cardiac: Goal: Ability to achieve and maintain adequate cardiopulmonary perfusion will improve Outcome: Progressing   Problem: Health Behavior/Discharge Planning: Goal: Ability to safely manage health-related needs after discharge will improve Outcome: Progressing   Problem: Education: Goal: Knowledge of General Education information will improve Description: Including pain rating scale, medication(s)/side effects and non-pharmacologic comfort measures Outcome: Progressing   Problem: Health Behavior/Discharge Planning: Goal: Ability to manage health-related needs will improve Outcome: Progressing   Problem: Clinical Measurements: Goal: Ability to maintain clinical measurements within normal limits will improve Outcome: Progressing Goal: Will remain free from infection Outcome: Progressing Goal: Diagnostic test results will improve Outcome: Progressing Goal: Respiratory complications will improve Outcome: Progressing Goal: Cardiovascular complication will be avoided Outcome: Progressing   Problem: Activity: Goal: Risk for activity intolerance will decrease Outcome: Progressing   Problem: Nutrition: Goal: Adequate nutrition will be maintained Outcome: Progressing   Problem: Coping: Goal: Level of anxiety will decrease Outcome: Progressing   Problem: Elimination: Goal: Will not experience complications related to bowel motility Outcome: Progressing Goal: Will not experience complications related to urinary retention Outcome: Progressing   Problem: Pain Managment: Goal: General experience of comfort will improve Outcome:  Progressing   Problem: Safety: Goal: Ability to remain free from injury will improve Outcome: Progressing   Problem: Skin Integrity: Goal: Risk for impaired skin integrity will decrease Outcome: Progressing   Problem: Education: Goal: Ability to describe self-care measures that may prevent or decrease complications (Diabetes Survival Skills Education) will improve Outcome: Progressing Goal: Individualized Educational Video(s) Outcome: Progressing   Problem: Coping: Goal: Ability to adjust to condition or change in health will improve Outcome: Progressing   Problem: Fluid Volume: Goal: Ability to maintain a balanced intake and output will improve Outcome: Progressing   Problem: Health Behavior/Discharge Planning: Goal: Ability to identify and utilize available resources and services will improve Outcome: Progressing Goal: Ability to manage health-related needs will improve Outcome: Progressing   Problem: Metabolic: Goal: Ability to maintain appropriate glucose levels will improve Outcome: Progressing   Problem: Nutritional: Goal: Maintenance of adequate nutrition will improve Outcome: Progressing Goal: Progress toward achieving an optimal weight will improve Outcome: Progressing   Problem: Skin Integrity: Goal: Risk for impaired skin integrity will decrease Outcome: Progressing   Problem: Tissue Perfusion: Goal: Adequacy of tissue perfusion will improve Outcome: Progressing   Problem: Education: Goal: Knowledge of the prescribed therapeutic regimen will improve Outcome: Progressing Goal: Ability to verbalize activity precautions or restrictions will improve Outcome: Progressing Goal: Understanding of discharge needs will improve Outcome: Progressing   Problem: Activity: Goal: Ability to perform//tolerate increased activity and mobilize with assistive devices will improve Outcome: Progressing   Problem: Clinical Measurements: Goal: Postoperative  complications will be avoided or minimized Outcome: Progressing   Problem: Self-Care: Goal: Ability to meet self-care needs will improve Outcome: Progressing   Problem: Self-Concept: Goal: Ability to maintain and perform role responsibilities to the fullest extent possible will improve Outcome: Progressing   Problem: Pain Management: Goal: Pain level will decrease with appropriate interventions Outcome: Progressing   Problem: Skin Integrity: Goal: Demonstration of wound healing without infection will improve Outcome: Progressing

## 2021-08-31 NOTE — Progress Notes (Signed)
Patient ID: Randall Coleman, male   DOB: Oct 21, 1950, 71 y.o.   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  There is no drainage in the wound VAC canister there is a good suction fit.  Anticipate patient would benefit from inpatient rehab.

## 2021-08-31 NOTE — Progress Notes (Incomplete)
PMR Admission Coordinator Pre-Admission Assessment   Patient: Randall Coleman is an 71 y.o., male MRN: [id number] DOB: 1950-09-22 Height: '6\' 2"'  (188 cm) Weight: 83.9 kg   Insurance Information HMO:     PPO:      PCP:      IPA:      80/20:      OTHER:  PRIMARY: [place]      Policy#: [id number]      Subscriber: patient CM Name:       Phone#:     Fax#:  Pre-Cert#: ***      Employer:  Benefits:  Phone #: [phone]     Name: *** Eff. Date: ***     Deduct: ***      Out of Pocket Max: ***      Life Max: *** CIR: ***      SNF: *** Outpatient: ***     Co-Pay: *** Home Health: ***      Co-Pay: *** DME: ***     Co-Pay: *** Providers: *** SECONDARY: ***      Policy#: ***     Phone#: ***   Financial Counselor:         Phone#:    The "Data Collection Information Summary" for patients in Inpatient Rehabilitation Facilities with attached "Privacy Act [place] Records" was provided and verbally reviewed with: N/A   Emergency Contact Information Contact Information       Name Relation Home Work Mobile    Schrum,Cheryl Sister     [phone]           Current Medical History  Patient Admitting Diagnosis: L BKA  History of Present Illness: Patient is a 71 year old male who arrived at emergency department with concerns of L foot infection. PMH includes: type 2 DM, HTN and DVT on eliquis.  Patient's white blood cell count  was 14, A1c 10.7, albumin 2.7 on admission. Patient also with Afib with RVR. Responded poorly to IV diltiazem until IV metoprolol was added. Patient found to have necrotic abscess of left foot. Patient is s/p great toe and second toe amputation prior to this admission. On 6/28 patient underwent transmetatarsal amputation per Dr. Sharol Given. Post op, abscess and necrotic tissue found to be extending to the hindfoot and on 08/29/21 patient underwent left below the knee amputation per Dr. Sharol Given. Therapies consulted and recommend CIR.      Patient's medical record from  [facility] has been reviewed by the rehabilitation admission coordinator and physician.   Past Medical History      Past Medical History:  Diagnosis Date   Diabetes mellitus without complication ([place])     DVT (deep venous thrombosis) ([place])     Hypertension        Has the patient had major surgery during 100 days prior to admission? Yes   Family History   family history is not on file.   Current Medications   Current Facility-Administered Medications:    0.9 %  sodium chloride infusion, , Intravenous, Continuous, [name], MD   acetaminophen (TYLENOL) tablet 325-650 mg, 325-650 mg, Oral, Q6H PRN, [name], MD, 650 mg at 08/30/21 2027   alum & mag hydroxide-simeth (MAALOX/MYLANTA) 200-200-20 MG/5ML suspension 15-30 mL, 15-30 mL, Oral, Q2H PRN, [name], MD   apixaban Arne [place]) tablet 5 mg, 5 mg, Oral, BID, Pokhrel, Laxman, MD, 5 mg at 08/31/21 7672   ascorbic acid (VITAMIN C) tablet 1,000 mg, 1,000 mg, Oral, Daily, Sharol Given,  Illene Regulus, MD, 1,000 mg at 08/31/21 6144   bisacodyl (DULCOLAX) EC tablet 5 mg, 5 mg, Oral, Daily PRN, [name], MD   dextrose 50 % solution 0-50 mL, 0-50 mL, Intravenous, PRN, [name], MD   diltiazem (CARDIZEM SR) 12 hr capsule 120 mg, 120 mg, Oral, Q12H, [name], MD, 120 mg at 08/31/21 3154   docusate sodium (COLACE) capsule 100 mg, 100 mg, Oral, Daily, [name], MD, 100 mg at 08/31/21 0086   feeding supplement (GLUCERNA SHAKE) (GLUCERNA SHAKE) liquid 237 mL, 237 mL, Oral, TID BM, [name], MD, 237 mL at 08/31/21 1452   guaiFENesin-dextromethorphan (ROBITUSSIN DM) 100-10 MG/5ML syrup 15 mL, 15 mL, Oral, Q4H PRN, [name], MD   hydrALAZINE (APRESOLINE) injection 5 mg, 5 mg, Intravenous, Q20 Min PRN, [name], MD   HYDROmorphone (DILAUDID) injection 0.5-1 mg, 0.5-1 mg, Intravenous, Q4H PRN, [name], MD, 1 mg at 08/30/21 1747   insulin aspart (novoLOG) injection 0-9 Units, 0-9 Units,  Subcutaneous, TID WC, [name], MD, 7 Units at 08/31/21 1250   insulin aspart (novoLOG) injection 4 Units, 4 Units, Subcutaneous, TID WC, Pokhrel, Laxman, MD   insulin detemir (LEVEMIR) injection 15 Units, 15 Units, Subcutaneous, BID, Pokhrel, Laxman, MD   labetalol (NORMODYNE) injection 10 mg, 10 mg, Intravenous, Q10 min PRN, [name], MD   magnesium oxide (MAG-OX) tablet 400 mg, 400 mg, Oral, BID, [name], MD, 400 mg at 08/31/21 7619   magnesium sulfate IVPB 2 g 50 mL, 2 g, Intravenous, Daily PRN, [name], MD   metoprolol tartrate (LOPRESSOR) injection 2-5 mg, 2-5 mg, Intravenous, Q2H PRN, [name], MD   metoprolol tartrate (LOPRESSOR) tablet 50 mg, 50 mg, Oral, BID, [name], MD, 50 mg at 08/31/21 5093   multivitamin with minerals tablet 1 tablet, 1 tablet, Oral, q morning, Pokhrel, Laxman, MD, 1 tablet at 08/31/21 2671   nicotine (NICODERM CQ - dosed in mg/24 hours) patch 21 mg, 21 mg, Transdermal, Daily, [name], MD, 21 mg at 08/31/21 2458   nutrition supplement (JUVEN) (JUVEN) powder packet 1 packet, 1 packet, Oral, BID, [name], MD, 1 packet at 08/31/21 0832   ondansetron (ZOFRAN) injection 4 mg, 4 mg, Intravenous, Q6H PRN, [name], MD   oxyCODONE (Oxy IR/ROXICODONE) immediate release tablet 10-15 mg, 10-15 mg, Oral, Q4H PRN, [name], MD, 15 mg at 08/31/21 1249   oxyCODONE (Oxy IR/ROXICODONE) immediate release tablet 5-10 mg, 5-10 mg, Oral, Q4H PRN, [name], MD, 10 mg at 08/30/21 0933   pantoprazole (PROTONIX) EC tablet 40 mg, 40 mg, Oral, Daily, [name], MD, 40 mg at 08/31/21 1113   phenol (CHLORASEPTIC) mouth spray 1 spray, 1 spray, Mouth/Throat, PRN, [name], MD   polyethylene glycol (MIRALAX / GLYCOLAX) packet 17 g, 17 g, Oral, Daily PRN, [name], MD   potassium chloride SA (KLOR-CON M) CR tablet 20-40 mEq, 20-40 mEq, Oral, Daily PRN, [name], MD   sacubitril-valsartan (ENTRESTO) 24-26 mg  per tablet, 1 tablet, Oral, BID, Charolette Forward, MD, 1 tablet at 08/31/21 0998   zinc sulfate capsule 220 mg, 220 mg, Oral, Daily, [name], MD, 220 mg at 08/31/21 1113   Patients Current Diet:  Diet Order                  Diet Carb Modified Fluid consistency: Thin; Room service appropriate?  Yes  Diet effective now                         Precautions / Restrictions Precautions Precautions: Fall Precaution Comments: wound vac Other Brace: L limb protector Restrictions Weight Bearing Restrictions: Yes LLE Weight Bearing: Non weight bearing    Has the patient had 2 or more falls or a fall with injury in the past year? No   Prior Activity Level   Prior Functional Level Self Care: Did the patient need help bathing, dressing, using the toilet or eating? Independent   Indoor Mobility: Did the patient need assistance with walking from room to room (with or without device)? Independent   Stairs: Did the patient need assistance with internal or external stairs (with or without device)? Independent   Functional Cognition: Did the patient need help planning regular tasks such as shopping or remembering to take medications? Independent   Patient Information Are you of Hispanic, Latino/a,or Spanish origin?: A. No, not of Hispanic, Latino/a, or Spanish origin What is your race?: A. White Do you need or want an interpreter to communicate with a doctor or health care staff?: 0. No   Patient's Response To:  Health Literacy and Transportation Is the patient able to respond to health literacy and transportation needs?: Yes Health Literacy - How often do you need to have someone help you when you read instructions, pamphlets, or other written material from your doctor or pharmacy?: Never In the past 12 months, has lack of transportation kept you from medical appointments or from getting medications?: No In the past 12 months, has lack of transportation kept you from meetings, work,  or from getting things needed for daily living?: No   Home Assistive Devices / [place] Devices/Equipment: Environmental consultant (specify type) Home Equipment: Rolling Walker (2 wheels)   Prior Device Use: Indicate devices/aids used by the patient prior to current illness, exacerbation or injury? None of the above   Current Functional Level Cognition   Overall Cognitive Status: History of cognitive impairments - at baseline Current Attention Level: Selective Orientation Level: Oriented X4 Following Commands: Follows one step commands with increased time Safety/Judgement: Decreased awareness of safety General Comments: Hx of STM deficits. Had to orient patient to date.    Extremity Assessment (includes Sensation/Coordination)   Upper Extremity Assessment: Generalized weakness  Lower Extremity Assessment: Defer to PT evaluation LLE Deficits / Details: s/p BKA. Able to perform quad set and SLR     ADLs   Overall ADL's : Needs assistance/impaired Eating/Feeding: Independent, Sitting Grooming: Set up, Sitting Upper Body Bathing: Set up, Sitting Lower Body Bathing: Maximal assistance, Sitting/lateral leans Upper Body Dressing : Set up, Sitting Lower Body Dressing: Maximal assistance, Sit to/from stand Toilet Transfer: Maximal assistance, Stand-pivot, BSC/3in1 Toilet Transfer Details (indicate cue type and reason): recommend lateral scoot to drop arm Toileting- Clothing Manipulation and Hygiene: Maximal assistance, Sit to/from stand Functional mobility during ADLs: Maximal assistance General ADL Comments: limited by pain and poor command following. squat pivot transfer from chair>bed with 1x sit<>stand     Mobility   Overal bed mobility: Needs Assistance Bed Mobility: Supine to Sit Supine to sit: Min guard Sit to supine: Min guard General bed mobility comments: Close min guard for safety, exiting towards left side of bed     Transfers   Overall transfer level: Needs  assistance Equipment used: Rolling walker (2 wheels) Transfers: Sit to/from Stand Sit to Stand: Mod assist, From elevated surface  Bed to/from chair/wheelchair/BSC transfer type:: Squat pivot Stand pivot transfers: Mod assist Squat pivot transfers: Max assist Step pivot transfers: Min assist, +2 physical assistance, +2 safety/equipment General transfer comment: Educated on technique and hand placement. Light mod assist for boost and balance, bed slightly elevated. Practed x2 from bed.     Ambulation / Gait / Stairs / Wheelchair Mobility   Ambulation/Gait Ambulation/Gait assistance: Mod assist Gait Distance (Feet): 5 Feet Assistive device: Rolling walker (2 wheels) Gait Pattern/deviations:  (hop) General Gait Details: Educated on sequencing with RW. Mod assist for RW control in forward direction approx 5 feet. Adequte foot clearance. RW adjusted for improved UE leverage and efficiency but may need adjusted again in the future. Slight assist with balance, VC throughout. Mod assist with turns and educated on backwards steps with assistive device. Very fatigued and somewhat anxious but performed well. Gait velocity: slow Gait velocity interpretation: <1.31 ft/sec, indicative of household ambulator Pre-gait activities: Standing pre-gait activities with weight shift, RW adjusted, and use of UEs to clearn foot from ground as well as making pivot turns.     Posture / Balance Balance Overall balance assessment: Needs assistance Sitting-balance support: Feet supported Sitting balance-Leahy Scale: Good Standing balance support: Reliant on assistive device for balance Standing balance-Leahy Scale: Poor     Special needs/care consideration Skin wound care    Previous Home Environment (from acute therapy documentation) Living Arrangements: Alone Available Help at Discharge: Family Type of Home: Apartment Home Layout: One level Home Access: Level entry Bathroom Shower/Tub: Tub/shower unit (with a  walk-in cut out) Bathroom Toilet: Standard Bathroom Accessibility: Yes How Accessible: Accessible via walker Home Care Services: No Additional Comments: Plans to move to [place] to live near sister   Discharge Living Setting Plans for Discharge Living Setting: Other (Comment) (this is patient's home set up, he could potentially go to his brother's home in [place] or his sister's home in [place].) Type of Home at Discharge: Apartment Discharge Home Layout: One level Discharge Home Access: Level entry Discharge Bathroom Shower/Tub: Tub/shower unit Discharge Bathroom Toilet: Standard Discharge Bathroom Accessibility: Yes How Accessible: Accessible via walker Does the patient have any problems obtaining your medications?: No   Social/Family/Support Systems Patient Roles:  (N/A) Anticipated Caregiver: Lenox Ahr Anticipated Caregiver's Contact Information: [phone] Ability/Limitations of Caregiver: can provide supervision, no physical assist Caregiver Availability: 24/7 Discharge Plan Discussed with Primary Caregiver: Yes Is Caregiver In Agreement with Plan?: Yes Does Caregiver/Family have Issues with Lodging/Transportation while Pt is in Rehab?: No   Goals Patient/Family Goal for Rehab: mod I for wheelchair level, supervision for short gait distances Expected length of stay: 10-14 days Pt/Family Agrees to Admission and willing to participate: Yes Program Orientation Provided & Reviewed with Pt/Caregiver Including Roles  & Responsibilities: Yes  Barriers to Discharge: Insurance for SNF coverage   Decrease burden of Care through IP rehab admission: N/A   Possible need for SNF placement upon discharge: not aniticpated   Patient Condition: I have reviewed medical records from [facility], spoken with  [facility] , and patient and family member. I met with patient at the bedside and discussed via phone for inpatient rehabilitation assessment.  Patient will benefit from ongoing PT  and OT, can actively participate in 3 hours of therapy a day 5 days of the week, and can make measurable gains during the admission.  Patient will also benefit from the coordinated team approach during an Inpatient Acute Rehabilitation admission.  The patient will receive intensive therapy as well as  Rehabilitation physician, nursing, social worker, and care management interventions.  Due to safety, skin/wound care, disease management, medication administration, pain management, and patient education the patient requires 24 hour a day rehabilitation nursing.  The patient is currently Mod A with mobility and basic ADLs.  Discharge setting and therapy post discharge at home with home health is anticipated.  Patient has agreed to participate in the Acute Inpatient Rehabilitation Program and will admit pending insurance approval ***.   Preadmission Screen Completed By:  Amanda Cockayne, 08/31/2021 3:17 PM

## 2021-08-31 NOTE — Progress Notes (Signed)
Upon chart review  EKG was performed on 08/30/2021 around 1300. Results are pending.

## 2021-08-31 NOTE — Progress Notes (Signed)
[place] for Heparin Indication: atrial fibrillation and hx of VTE  No Known Allergies  Patient Measurements: Height: '6\' 2"'$  (188 cm) Weight: 83.9 kg (184 lb 15.5 oz) IBW/kg (Calculated) : 82.2 Heparin Dosing Weight: 84 kg  Vital Signs: Temp: 98.3 F (36.8 C) (07/02 2027) Temp Source: Oral (07/02 2027) BP: 139/76 (07/02 2027) Pulse Rate: 81 (07/02 2027)  Labs: Recent Labs    08/28/21 0201 08/29/21 0121 08/30/21 0049 08/30/21 1520 08/31/21 0047  HGB 10.2* 11.0* 10.7*  --  10.5*  HCT 31.0* 31.9* 31.3*  --  31.5*  PLT 217 214 248  --  243  HEPARINUNFRC  --  0.45  --  0.26* 0.35  CREATININE 0.73 0.64 0.59*  --   --      Estimated Creatinine Clearance: 101.3 mL/min (A) (by C-G formula based on SCr of 0.59 mg/dL (L)).   Assessment: 79 yom with a history of DVT and atrial fibrillation on Eliquis PTA (LD 6.26). Patient presented with pain, swelling and redness in his left foot. Heparin per pharmacy consult placed while Eliquis on hold.   Anticipated to have falsely high anti-Xa level secondary to DOAC use, however anti-Xa levels have remained undetectable since admission. Unsure if consistently taking DOAC PTA.  Heparin off since 7/1 0100 for currently off as pt to OR 7/1 for L BKA. Resumed heparin this AM at 9 AM.  Heparin level this afternoon just slightly below goal at 0.26 on heparin infusion at 1900 units/hr.  No overt bleeding or complications noted.  7/3 AM update:  Heparin level therapeutic after rate increase  Goal of Therapy:  Heparin level 0.3-0.7 units/ml (consider 0.3-0.5 with recent surgery) Monitor platelets by anticoagulation protocol: Yes   Plan:  Cont heparin 1950 units/hr 1200 heparin level F/u when able to transition back to apixaban  Narda Bonds, PharmD, [place] Pharmacist Phone: [phone]

## 2021-08-31 NOTE — Progress Notes (Addendum)
PROGRESS NOTE    Randall Coleman  AVW:[id number] DOB: 10/09/1950 DOA: 08/25/2021 PCP: Merryl Hacker, No    Brief Narrative:  Randall Coleman is a 71 y.o. male with medical history of type 2 diabetes, hypertension, history of DVT on Eliquis presented to hospital with left foot infection with swelling and redness of the foot and had foul-smelling discharge and ambulatory dysfunction.  In the ED, patient was afebrile with mild leukocytosis and hyponatremia.  Lactate was elevated at 2.0.  Chest x-ray without any obvious infiltrate.  X-ray of the foot showed findings suggestive of osteomyelitis involving the distal third digit.  Patient was given 2 L of IV fluids, vancomycin and cefepime and Dr. Sharol Given was notified.  Patient was then admitted hospital for further evaluation and treatment.    During hospitalization, patient was seen by orthopedics and underwent transmetatarsal amputation on 08/26/2021 with findings of abscess in the hindfoot.  Patient subsequently underwent below-knee amputation on 08/29/21. At this time, patient has been considered for possible CIR.  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  Patient had initially received vanc/cefepime, flagyl which has been discontinued due to margins being clear as per orthopedic recommendation.  ABI of the left lower extremity was normal on 08/06/2020. Physical therapy has recommended CIR at this time.  Chronic atrial fibrillation with RVR (HCC) Controlled at  this time.  Initially needed Cardizem drip.  Now on oral Cardizem. .  2D echocardiogram with LV ejection fraction of 40 to 45% with global hypokinesis.   Continue metoprolol.  Cardiology following and has been started on Entresto.  We will transition to Eliquis starting today.  Patient was on heparin drip.  DKA, type 2 with hyperglycemia Likely secondary to sepsis from left foot osteomyelitis.  Received IV fluids and insulin while in the hospital.  Latest hemoglobin A1c of 7.9 in 04/2021.  Repeat hemoglobin A1c at 10.7 on 08/25/2021.  Diabetic coordinator on board and insulin dose has been increased to Levemir 15 twice a day and mealtime insulin will be added 4 units 3 times daily.  Continue sliding scale as well.   History of DVT of lower extremity On eliquis as outpatient.  Will resume Eliquis.    Essential hypertension Not on medications at home.  Currently on Cardizem and metoprolol.  Blood pressure is stable.  History of cigarette smoking.  On nicotine patch.  Mild hyponatremia.  We will continue to monitor closely.  Latest sodium of 133  Hypomagnesemia.  Magnesium level of 1.6.  Continue magnesium oxide  Nutrition Status:Body mass index is 23.75 kg/m.  Present on admission. Nutrition Problem: Severe Malnutrition Etiology: chronic illness (uncontrolled DM with A1C=10.7) Signs/Symptoms: severe muscle depletion, severe fat depletion Interventions: Ensure Enlive (each supplement provides 350kcal and 20 grams of protein), MVI, Juven     DVT prophylaxis: SCD's Start: 08/29/21 0924 SCD's Start: 08/26/21 1056 apixaban (ELIQUIS) tablet 5 mg   Code Status:     Code Status: Full Code  Disposition: Physical therapy recommends CIR.   Status is: Inpatient  Remains inpatient appropriate because: Status post amputation, awaiting for rehabilitation   Family Communication:  Communicated with the patient's sister at bedside on 08/26/2021.  Consultants:  Orthopedics Dr Sharol Given.  Procedures:   Left transmetatarsal amputation on 08/26/2021. Left below-knee amputation on 08/29/2021  Antimicrobials:  Vancomycin, Flagyl, cefepime- dced on 7/1  Subjective: Today, patient was seen and examined at bedside.  Patient denies any headache, nausea, vomiting, fever or chills.  Objective: Vitals:   08/30/21 1131 08/30/21 2027 08/31/21 0824 08/31/21 1143  BP: 128/82 139/76 128/69 101/63  Pulse: 74 81 76 68  Resp: '20 18 18 18  '$ Temp: 98 F (36.7 C) 98.3 F (36.8 C) 98 F (36.7 C) 98 F (36.7 C)  TempSrc:  Oral    SpO2: 98% 96% 100% 96%  Weight:      Height:        Intake/Output Summary (Last 24 hours) at 08/31/2021 1343 Last data filed at 08/31/2021 0851 Gross per 24 hour  Intake 935.84 ml  Output 1600 ml  Net -664.16 ml   Filed Weights   08/25/21 1438 08/29/21 0729  Weight: 83.9 kg 83.9 kg    Physical Examination: Body mass index is 23.75 kg/m.   General:  Average built, not in obvious distress, appears older than stated age HENT:   No scleral pallor or icterus noted. Oral mucosa is moist.  Chest:  Clear breath sounds.  Diminished breath sounds bilaterally. No crackles or wheezes.  CVS: S1 &S2 heard. No murmur.  Regular rate and rhythm. Abdomen: Soft, nontender, nondistended.  Bowel sounds are heard.   Extremities: Left below-knee amputation with covered stump. Psych: Alert, awake and oriented, normal mood CNS:  No cranial nerve deficits.  Power equal in all extremities.   Skin:   Status post left below-knee amputation.  Data Reviewed:   CBC: Recent Labs  Lab 08/25/21 1139 08/26/21 0657 08/27/21 0401 08/28/21 0201 08/29/21 0121 08/30/21 0049 08/31/21 0047  WBC 14.0*   < > 7.6 7.3 6.6 8.5 7.4  NEUTROABS 12.1*  --   --   --   --   --   --   HGB 14.3   < > 11.1* 10.2* 11.0* 10.7* 10.5*  HCT 43.7   < > 32.8* 31.0* 31.9* 31.3* 31.5*  MCV 84.4   < > 83.0 82.9 81.8 82.8 83.6  PLT 292   < > 240 217 214 248 243   < > = values in this interval not displayed.     Basic Metabolic Panel: Recent Labs  Lab 08/25/21 1422 08/25/21 1656 08/25/21 1755 08/27/21 0401 08/28/21 0201 08/29/21 0121 08/30/21 0049  NA 129*   < > 129* 130* 133* 135 133*  K 4.7   < > 4.4 4.0 4.3 4.0 4.4  CL 93*   < > 96* 101 104 104 102  CO2 17*   < > 21* '23 24 25 25  '$ GLUCOSE 383*   < > 287* 331* 231* 318* 308*  BUN 27*   < > 25* 18 12 7* 13  CREATININE 0.98   < > 0.74 0.84 0.73 0.64 0.59*  CALCIUM 8.6*   < > 8.4* 8.1* 7.8* 8.1* 8.0*  MG 1.8  --   --  1.6* 1.9 1.7 1.6*   < > = values in this interval not displayed.    Liver Function Tests: Recent Labs  Lab 08/25/21 1139  AST 19  ALT 20  ALKPHOS 106  BILITOT 1.2  PROT 6.8  ALBUMIN 2.7*  Radiology Studies: No results found.    LOS: 6 days    Flora Lipps, MD [facility] Available via Epic secure chat 7am-7pm After these hours, please refer to coverage provider listed on [email or web address] 08/31/2021, 1:43 PM

## 2021-08-31 NOTE — Progress Notes (Signed)
Physical Therapy Treatment Patient Details Name: Randall Coleman MRN: [id number] DOB: [birth year] Today's Date: 08/31/2021   History of Present Illness Pt is a 71 y.o. 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 Significant medical history: T2DM, HTN,  hx of DVT on eliquis.    PT Comments    Patient progressing towards functional goals. Tolerated transfer training, and short distance gait in room today. Up to 5 feet including 90 degree turn and backwards steps to chair. Mod assist for walker control and stability without any overt buckling noted. Pt very motivated and inquisitive. Further education for positioning, limb protector brace, and various light tactile activities to residual limb 2/2 phantom pains. Patient will continue to benefit from skilled physical therapy services to further improve independence with functional mobility.   Recommendations for follow up therapy are one component of a multi-disciplinary discharge planning process, led by the attending physician.  Recommendations may be updated based on patient status, additional functional criteria and insurance authorization.  Follow Up Recommendations  Acute inpatient rehab (3hours/day)     Assistance Recommended at Discharge Frequent or constant Supervision/Assistance  Patient can return home with the following A lot of help with walking and/or transfers;A lot of help with bathing/dressing/bathroom   Equipment Recommendations  BSC/3in1;Wheelchair (measurements PT);Wheelchair cushion (measurements PT)    Recommendations for Other Services Rehab consult     Precautions / Restrictions Precautions Precautions: Fall Precaution Comments: wound vac Required Braces or Orthoses: Other Brace Other Brace: L limb protector Restrictions Weight Bearing Restrictions: Yes LLE Weight Bearing: Non weight bearing     Mobility  Bed Mobility Overal bed mobility: Needs Assistance Bed Mobility: Supine to Sit     Supine to sit:  Min guard     General bed mobility comments: Close min guard for safety, exiting towards left side of bed    Transfers Overall transfer level: Needs assistance Equipment used: Rolling walker (2 wheels) Transfers: Sit to/from Stand Sit to Stand: Mod assist, From elevated surface Stand pivot transfers: Mod assist         General transfer comment: Educated on technique and hand placement. Light mod assist for boost and balance, bed slightly elevated. Practed x2 from bed.    Ambulation/Gait Ambulation/Gait assistance: Mod assist Gait Distance (Feet): 5 Feet Assistive device: Rolling walker (2 wheels) Gait Pattern/deviations:  (hop) Gait velocity: slow Gait velocity interpretation: <1.31 ft/sec, indicative of household ambulator Pre-gait activities: Standing pre-gait activities with weight shift, RW adjusted, and use of UEs to clearn foot from ground as well as making pivot turns. General Gait Details: Educated on sequencing with RW. Mod assist for RW control in forward direction approx 5 feet. Adequte foot clearance. RW adjusted for improved UE leverage and efficiency but may need adjusted again in the future. Slight assist with balance, VC throughout. Mod assist with turns and educated on backwards steps with assistive device. Very fatigued and somewhat anxious but performed well.   Stairs             Wheelchair Mobility    Modified Rankin (Stroke Patients Only)       Balance Overall balance assessment: Needs assistance Sitting-balance support: Feet supported Sitting balance-Leahy Scale: Good     Standing balance support: Reliant on assistive device for balance Standing balance-Leahy Scale: Poor                              Cognition Arousal/Alertness: Awake/alert Behavior During Therapy:  WFL for tasks assessed/performed Overall Cognitive Status: History of cognitive impairments - at baseline Area of Impairment: Memory, Problem solving                      Memory: Decreased short-term memory       Problem Solving: Slow processing, Requires verbal cues General Comments: Hx of STM deficits. Had to orient patient to date.        Exercises Other Exercises Other Exercises: Education provided on quad sets, glute sets, no pillow underneath knee, limb protector use    General Comments General comments (skin integrity, edema, etc.): Educated on light tactile facilitation of residual limb for phantom pain. How to adjust limb protector.      Pertinent Vitals/Pain Pain Assessment Pain Assessment: Faces Faces Pain Scale: Hurts even more Pain Location: phantom pain Pain Descriptors / Indicators: Operative site guarding, Grimacing Pain Intervention(s): Limited activity within patient's tolerance, Premedicated before session, Monitored during session, Repositioned    Home Living                          Prior Function            PT Goals (current goals can now be found in the care plan section) Acute Rehab PT Goals Patient Stated Goal: less pain PT Goal Formulation: With patient Time For Goal Achievement: 09/13/21 Potential to Achieve Goals: Good Progress towards PT goals: Progressing toward goals    Frequency    Min 3X/week      PT Plan Current plan remains appropriate    Co-evaluation              AM-PAC PT "6 Clicks" Mobility   Outcome Measure  Help needed turning from your back to your side while in a flat bed without using bedrails?: A Little Help needed moving from lying on your back to sitting on the side of a flat bed without using bedrails?: A Little Help needed moving to and from a bed to a chair (including a wheelchair)?: A Lot Help needed standing up from a chair using your arms (e.g., wheelchair or bedside chair)?: A Lot Help needed to walk in hospital room?: A Lot Help needed climbing 3-5 steps with a railing? : Total 6 Click Score: 13    End of Session Equipment Utilized  During Treatment: Gait belt;Other (comment) (limb protector) Activity Tolerance: Patient tolerated treatment well Patient left: in chair;with call bell/phone within reach;with chair alarm set;with SCD's reapplied Nurse Communication: Mobility status PT Visit Diagnosis: Unsteadiness on feet (R26.81);Other abnormalities of gait and mobility (R26.89);Muscle weakness (generalized) (M62.81);Pain Pain - Right/Left: Left Pain - part of body:  (residual limb)     Time: 1660-6301 PT Time Calculation (min) (ACUTE ONLY): 29 min  Charges:  $Gait Training: 8-22 mins $Therapeutic Activity: 8-22 mins                     Candie Mile, PT    Randall Coleman 08/31/2021, 11:44 AM

## 2021-08-31 NOTE — Progress Notes (Addendum)
Inpatient Rehab Admissions Coordinator:  Addendum: Spoke with patient's sister, Malachy Mood by phone. Malachy Mood reports that she and/or her siblings in [place] will be able to provide supervision for patient upon his discharge from [facility].    Met with patient at bedside. Discussed 3 hours of therapy per day and the need for 24/7 supervision upon discharge. He is interested in pursuing this. Patient reports that he has a sister in [place] that may be able to help. I have called his sister, Malachy Mood, and left message to verify her ability to assist. Will continue to follow patient for potential admission to [facility].    Amanda Cockayne, PT, [facility] Admissions Coordinator [phone] 08/31/21,1:43 PM

## 2021-08-31 NOTE — PMR Pre-admission (Signed)
PMR Admission Coordinator Pre-Admission Assessment  Patient: Randall Coleman is an 71 y.o., male MRN: [id number] DOB: 12/03/1950 Height: 6' 2" (188 cm) Weight: 83.9 kg  Insurance Information HMO:     PPO:      PCP:      IPA:      80/20:      OTHER:  PRIMARY: [place]      Policy#: [id number]      Subscriber: patient CM Name: Rubin Payor      Phone#: [phone]    Fax#: [phone] Pre-Cert#: tbd on admit      Employer:  Benefits:  Phone #: [phone]     Name:  Eff. Date: 07/25/2017     Deduct: $0      Out of Pocket Max: $0      Life Max: n/a CIR: 100%      SNF: 100% Outpatient: 100%     Co-Pay:  Home Health: 100%      Co-Pay:  DME: 100%     Co-Pay:  Providers:  SECONDARY: medicare A/B (07/02/62)    Policy#:  [id number]    Phone#:   Financial Counselor:        Phone#:   The "Data Collection Information Summary" for patients in Inpatient Rehabilitation Facilities with attached "Privacy Act [place] Records" was provided and verbally reviewed with: N/A  Emergency Contact Information Contact Information     Name Relation Home Work Mobile   Schrum,Cheryl Sister   [phone]       Current Medical History  Patient Admitting Diagnosis: L BKA  History of Present Illness: Patient is a 71 year old male who arrived at emergency department with concerns of L foot infection. PMH includes: type 2 DM, HTN and DVT on eliquis.  Patient's white blood cell count  was 14, A1c 10.7, albumin 2.7 on admission. Patient also with Afib with RVR. Responded poorly to IV diltiazem until IV metoprolol was added. Briefly required cardizem and heparin.  Patient found to have necrotic abscess of left foot. Patient is s/p great toe and second toe amputation prior to this admission. On 6/28 patient underwent transmetatarsal amputation per Dr. Sharol Given. Post op, abscess and necrotic tissue found to be extending to the hindfoot and on 08/29/21 patient underwent left below the knee amputation per Dr. Sharol Given.  Therapies consulted and recommend CIR.       Patient's medical record from [facility] has been reviewed by the rehabilitation admission coordinator and physician.  Past Medical History  Past Medical History:  Diagnosis Date   Atrial fibrillation ([place])    Diabetes mellitus without complication ([place])    DVT (deep venous thrombosis) ([place])    Hypertension     Has the patient had major surgery during 100 days prior to admission? Yes  Family History   family history is not on file.  Current Medications  Current Facility-Administered Medications:    0.9 %  sodium chloride infusion, , Intravenous, Continuous, [name], MD   acetaminophen (TYLENOL) tablet 325-650 mg, 325-650 mg, Oral, Q6H PRN, [name], MD, 650 mg at 08/30/21 2027   alum & mag hydroxide-simeth (MAALOX/MYLANTA) 200-200-20 MG/5ML suspension 15-30 mL, 15-30 mL, Oral, Q2H PRN, [name], MD   apixaban Arne Cleveland) tablet 5 mg, 5 mg, Oral, BID, Pokhrel, Laxman, MD, 5 mg at 09/01/21 9169   ascorbic acid (VITAMIN C) tablet 1,000 mg, 1,000 mg, Oral, Daily, [name], MD, 1,000 mg at 09/01/21 0902   bisacodyl (DULCOLAX) EC  tablet 5 mg, 5 mg, Oral, Daily PRN, [name], MD   dextrose 50 % solution 0-50 mL, 0-50 mL, Intravenous, PRN, [name], MD   diltiazem (CARDIZEM SR) 12 hr capsule 120 mg, 120 mg, Oral, Q12H, [name], MD, 120 mg at 09/01/21 0901   docusate sodium (COLACE) capsule 100 mg, 100 mg, Oral, Daily, [name], MD, 100 mg at 09/01/21 0901   feeding supplement (GLUCERNA SHAKE) (GLUCERNA SHAKE) liquid 237 mL, 237 mL, Oral, TID BM, [name], MD, 237 mL at 09/01/21 0908   guaiFENesin-dextromethorphan (ROBITUSSIN DM) 100-10 MG/5ML syrup 15 mL, 15 mL, Oral, Q4H PRN, [name], MD   hydrALAZINE (APRESOLINE) injection 5 mg, 5 mg, Intravenous, Q20 Min PRN, [name], MD   HYDROmorphone (DILAUDID) injection 0.5-1 mg, 0.5-1 mg, Intravenous, Q4H PRN, [name], MD,  1 mg at 08/30/21 1747   insulin aspart (novoLOG) injection 0-9 Units, 0-9 Units, Subcutaneous, TID WC, [name], MD, 7 Units at 09/01/21 0903   insulin aspart (novoLOG) injection 4 Units, 4 Units, Subcutaneous, TID WC, Pokhrel, Laxman, MD, 4 Units at 09/01/21 0903   insulin detemir (LEVEMIR) injection 15 Units, 15 Units, Subcutaneous, BID, Pokhrel, Laxman, MD, 15 Units at 09/01/21 0902   labetalol (NORMODYNE) injection 10 mg, 10 mg, Intravenous, Q10 min PRN, [name], MD   magnesium oxide (MAG-OX) tablet 400 mg, 400 mg, Oral, BID, [name], MD, 400 mg at 09/01/21 0901   magnesium sulfate IVPB 2 g 50 mL, 2 g, Intravenous, Daily PRN, [name], MD   metoprolol tartrate (LOPRESSOR) injection 2-5 mg, 2-5 mg, Intravenous, Q2H PRN, [name], MD   metoprolol tartrate (LOPRESSOR) tablet 50 mg, 50 mg, Oral, BID, [name], MD, 50 mg at 09/01/21 0901   multivitamin with minerals tablet 1 tablet, 1 tablet, Oral, q morning, Pokhrel, Laxman, MD, 1 tablet at 09/01/21 2023   nicotine (NICODERM CQ - dosed in mg/24 hours) patch 21 mg, 21 mg, Transdermal, Daily, [name], MD, 21 mg at 09/01/21 3435   nutrition supplement (JUVEN) (JUVEN) powder packet 1 packet, 1 packet, Oral, BID, [name], MD, 1 packet at 09/01/21 0902   ondansetron (ZOFRAN) injection 4 mg, 4 mg, Intravenous, Q6H PRN, [name], MD   oxyCODONE (Oxy IR/ROXICODONE) immediate release tablet 10-15 mg, 10-15 mg, Oral, Q4H PRN, [name], MD, 15 mg at 09/01/21 0901   oxyCODONE (Oxy IR/ROXICODONE) immediate release tablet 5-10 mg, 5-10 mg, Oral, Q4H PRN, [name], MD, 10 mg at 08/30/21 0933   pantoprazole (PROTONIX) EC tablet 40 mg, 40 mg, Oral, Daily, [name], MD, 40 mg at 09/01/21 0905   phenol (CHLORASEPTIC) mouth spray 1 spray, 1 spray, Mouth/Throat, PRN, [name], MD   polyethylene glycol (MIRALAX / GLYCOLAX) packet 17 g, 17 g, Oral, Daily PRN, [name], MD   potassium  chloride SA (KLOR-CON M) CR tablet 20-40 mEq, 20-40 mEq, Oral, Daily PRN, [name], MD   sacubitril-valsartan (ENTRESTO) 24-26 mg per tablet, 1 tablet, Oral, BID, Charolette Forward, MD, 1 tablet at 09/01/21 6861   zinc sulfate capsule 220 mg, 220 mg, Oral, Daily, [name], MD, 220 mg at 09/01/21 0902  Patients Current Diet:  Diet Order             Diet Carb Modified           Diet Carb Modified Fluid consistency:  Thin; Room service appropriate? Yes  Diet effective now                   Precautions / Restrictions Precautions Precautions: Fall Precaution Comments: wound vac Other Brace: L limb protector Restrictions Weight Bearing Restrictions: Yes LLE Weight Bearing: Non weight bearing   Has the patient had 2 or more falls or a fall with injury in the past year? No  Prior Activity Level    Prior Functional Level Self Care: Did the patient need help bathing, dressing, using the toilet or eating? Independent  Indoor Mobility: Did the patient need assistance with walking from room to room (with or without device)? Independent  Stairs: Did the patient need assistance with internal or external stairs (with or without device)? Independent  Functional Cognition: Did the patient need help planning regular tasks such as shopping or remembering to take medications? Independent  Patient Information Are you of Hispanic, Latino/a,or Spanish origin?: A. No, not of Hispanic, Latino/a, or Spanish origin What is your race?: A. White Do you need or want an interpreter to communicate with a doctor or health care staff?: 0. No  Patient's Response To:  Health Literacy and Transportation Is the patient able to respond to health literacy and transportation needs?: Yes Health Literacy - How often do you need to have someone help you when you read instructions, pamphlets, or other written material from your doctor or pharmacy?: Never In the past 12 months, has lack of transportation  kept you from medical appointments or from getting medications?: No In the past 12 months, has lack of transportation kept you from meetings, work, or from getting things needed for daily living?: No  Home Assistive Devices / [place] Devices/Equipment: Environmental consultant (specify type) Home Equipment: Rolling Walker (2 wheels)  Prior Device Use: Indicate devices/aids used by the patient prior to current illness, exacerbation or injury? None of the above  Current Functional Level Cognition  Overall Cognitive Status: History of cognitive impairments - at baseline Current Attention Level: Selective Orientation Level: Oriented X4 Following Commands: Follows one step commands with increased time Safety/Judgement: Decreased awareness of safety General Comments: Hx of STM deficits. Had to orient patient to date.    Extremity Assessment (includes Sensation/Coordination)  Upper Extremity Assessment: Generalized weakness  Lower Extremity Assessment: Defer to PT evaluation LLE Deficits / Details: s/p BKA. Able to perform quad set and SLR    ADLs  Overall ADL's : Needs assistance/impaired Eating/Feeding: Independent, Sitting Grooming: Set up, Sitting Upper Body Bathing: Set up, Sitting Lower Body Bathing: Maximal assistance, Sitting/lateral leans Upper Body Dressing : Set up, Sitting Lower Body Dressing: Maximal assistance, Sit to/from stand Toilet Transfer: Maximal assistance, Stand-pivot, BSC/3in1 Toilet Transfer Details (indicate cue type and reason): recommend lateral scoot to drop arm Toileting- Clothing Manipulation and Hygiene: Maximal assistance, Sit to/from stand Functional mobility during ADLs: Maximal assistance General ADL Comments: limited by pain and poor command following. squat pivot transfer from chair>bed with 1x sit<>stand    Mobility  Overal bed mobility: Needs Assistance Bed Mobility: Supine to Sit Supine to sit: Min guard Sit to supine: Min guard General bed  mobility comments: Close min guard for safety, exiting towards left side of bed    Transfers  Overall transfer level: Needs assistance Equipment used: Rolling walker (2 wheels) Transfers: Sit to/from Stand Sit to Stand: Mod assist, From elevated surface Bed to/from chair/wheelchair/BSC transfer type:: Squat pivot Stand pivot transfers: Mod assist Squat pivot transfers: Max assist Step pivot  transfers: Min assist, +2 physical assistance, +2 safety/equipment General transfer comment: Educated on technique and hand placement. Light mod assist for boost and balance, bed slightly elevated. Practed x2 from bed.    Ambulation / Gait / Stairs / Wheelchair Mobility  Ambulation/Gait Ambulation/Gait assistance: Mod assist Gait Distance (Feet): 5 Feet Assistive device: Rolling walker (2 wheels) Gait Pattern/deviations:  (hop) General Gait Details: Educated on sequencing with RW. Mod assist for RW control in forward direction approx 5 feet. Adequte foot clearance. RW adjusted for improved UE leverage and efficiency but may need adjusted again in the future. Slight assist with balance, VC throughout. Mod assist with turns and educated on backwards steps with assistive device. Very fatigued and somewhat anxious but performed well. Gait velocity: slow Gait velocity interpretation: <1.31 ft/sec, indicative of household ambulator Pre-gait activities: Standing pre-gait activities with weight shift, RW adjusted, and use of UEs to clearn foot from ground as well as making pivot turns.    Posture / Balance Balance Overall balance assessment: Needs assistance Sitting-balance support: Feet supported Sitting balance-Leahy Scale: Good Standing balance support: Reliant on assistive device for balance Standing balance-Leahy Scale: Poor    Special needs/care consideration Skin wound care   Previous Home Environment (from acute therapy documentation) Living Arrangements: Alone Available Help at Discharge:  Family Type of Home: Apartment Home Layout: One level Home Access: Level entry Bathroom Shower/Tub: Tub/shower unit (with a walk-in cut out) Bathroom Toilet: Standard Bathroom Accessibility: Yes How Accessible: Accessible via walker Home Care Services: No Additional Comments: Plans to move to [place] to live near sister  Discharge Living Setting Plans for Discharge Living Setting: Other (Comment) (this is patient's home set up, he could potentially go to his brother's home in [place] or his sister's home in [place].) Type of Home at Discharge: Apartment Discharge Home Layout: One level Discharge Home Access: Level entry Discharge Bathroom Shower/Tub: Tub/shower unit Discharge Bathroom Toilet: Standard Discharge Bathroom Accessibility: Yes How Accessible: Accessible via walker Does the patient have any problems obtaining your medications?: No  Social/Family/Support Systems Patient Roles:  (N/A) Anticipated Caregiver: Lenox Ahr Anticipated Caregiver's Contact Information: [phone] Ability/Limitations of Caregiver: can provide supervision, no physical assist Caregiver Availability: 24/7 Discharge Plan Discussed with Primary Caregiver: Yes Is Caregiver In Agreement with Plan?: Yes Does Caregiver/Family have Issues with Lodging/Transportation while Pt is in Rehab?: No  Goals Patient/Family Goal for Rehab: mod I for wheelchair level, supervision for short gait distances Expected length of stay: 10-14 days Pt/Family Agrees to Admission and willing to participate: Yes Program Orientation Provided & Reviewed with Pt/Caregiver Including Roles  & Responsibilities: Yes  Barriers to Discharge: Insurance for SNF coverage  Decrease burden of Care through IP rehab admission: N/A  Possible need for SNF placement upon discharge: not aniticpated  Patient Condition: I have reviewed medical records from [facility], spoken with  [facility] , and patient and family member. I met with  patient at the bedside and discussed via phone for inpatient rehabilitation assessment.  Patient will benefit from ongoing PT and OT, can actively participate in 3 hours of therapy a day 5 days of the week, and can make measurable gains during the admission.  Patient will also benefit from the coordinated team approach during an Inpatient Acute Rehabilitation admission.  The patient will receive intensive therapy as well as Rehabilitation physician, nursing, social worker, and care management interventions.  Due to safety, skin/wound care, disease management, medication administration, pain management, and patient education the patient requires 24 hour a  day rehabilitation nursing.  The patient is currently Mod A with mobility and basic ADLs.  Discharge setting and therapy post discharge at home with home health is anticipated.  Patient has agreed to participate in the Acute Inpatient Rehabilitation Program and will admit today.  Preadmission Screen Completed By:  Michel Santee, PT, DPT 09/01/2021 10:34 AM ______________________________________________________________________   Discussed status with Dr. Naaman Plummer on 09/01/21  at 10:34 AM  and received approval for admission today.  Admission Coordinator:  Michel Santee, PT, DPT time 10:34 AM Sudie Grumbling 09/01/21    Assessment/Plan: Diagnosis: left bka Does the need for close, 24 hr/day Medical supervision in concert with the patient's rehab needs make it unreasonable for this patient to be served in a less intensive setting? Yes Co-Morbidities requiring supervision/potential complications: a fib, dm, htn Due to bladder management, bowel management, safety, skin/wound care, disease management, medication administration, pain management, and patient education, does the patient require 24 hr/day rehab nursing? Yes Does the patient require coordinated care of a physician, rehab nurse, PT, OT, and SLP to address physical and functional deficits in the context of  the above medical diagnosis(es)? Yes Addressing deficits in the following areas: balance, endurance, locomotion, strength, transferring, bowel/bladder control, bathing, dressing, feeding, grooming, toileting, and psychosocial support Can the patient actively participate in an intensive therapy program of at least 3 hrs of therapy 5 days a week? Yes The potential for patient to make measurable gains while on inpatient rehab is excellent Anticipated functional outcomes upon discharge from inpatient rehab: modified independent PT, modified independent OT, n/a SLP, supervision for gait Estimated rehab length of stay to reach the above functional goals is: 10-14 days Anticipated discharge destination: Home 10. Overall Rehab/Functional Prognosis: excellent   MD Signature: Meredith Staggers, MD, [place] Director Rehabilitation Services 09/01/2021

## 2021-08-31 NOTE — Inpatient Diabetes Management (Addendum)
Inpatient Diabetes Program Recommendations  AACE/ADA: New Consensus Statement on Inpatient Glycemic Control (2015)  Target Ranges:  Prepandial:   less than 140 mg/dL      Peak postprandial:   less than 180 mg/dL (1-2 hours)      Critically ill patients:  140 - 180 mg/dL   Lab Results  Component Value Date   GLUCAP 340 (H) 08/31/2021   HGBA1C 10.7 (H) 08/25/2021    Review of Glycemic Control  Latest Reference Range & Units 08/31/21 08:20 08/31/21 11:39  Glucose-Capillary 70 - 99 mg/dL 267 (H) 340 (H)  (H): Data is abnormally high  Diabetes history: DM2 Outpatient Diabetes medications: Metformin 1000 mg BID Current orders for Inpatient glycemic control: Levemir 10 units BID, Novolog 0-9 units TID and Novolog 2 units TID with meals  Inpatient Diabetes Program Recommendations:    Levemir 15 units BID Novolog 5 units TID with meals if consumes at least 50%  Ordered LWWD booklet, insulin pen starter kit.  Possible DC to CIR?  Left BKA.   Spoke with patient at bedside.  He is a very pleasant gentleman willing to make any changes recommended for his well being.  Reviewed patient's current A1c of 10.7% (average blood sugar of 260 mg/dL). Explained what a A1c is and what it measures. Also reviewed goal A1c with patient, importance of good glucose control @ home, and blood sugar goals.  He confirms that he takes Metformin 1000 mg BID.  He receives all of his medications through the [facility].  He does not check his blood sugar and does not have a glucometer.  Discussed long and short term complications of uncontrolled blood sugars.  Educated on CHO's, portion control, monitoring blood glucose & goal CHO's per meal.  He does not drink any beverages with sugar, he does not eat large meals; usually 1-2 small meals a day.  He mainly buys microwave meals.  Encouraged him to be mindful of the carbohydrates.  Try to eat around 60 CHO per meal.    He will likely need insulin at DC given his  insulin needs inpatient and not much room for improvement with diet as he does not eat sweets or drink caloric beverages.    He states he is willing to administer insulin at home and would be interested in the [facility] 2 CGM.    Showed him the insulin pen.  Will continue to follow and teach insulin as he gets closer to DC.  Appears he may DC to CIR.    Counseled on smoking cessation.     Insulin prescriptions will need to be sent to the [facility].  Per TOC the [facility] will mail the insulins next day.     Will continue to follow while inpatient.  Thank you, Reche Dixon, MSN, [place] Diabetes Coordinator Inpatient Diabetes Program [phone] (team pager from 8a-5p)

## 2021-08-31 NOTE — Plan of Care (Signed)

## 2021-09-01 ENCOUNTER — Other Ambulatory Visit: Payer: Self-pay

## 2021-09-01 ENCOUNTER — Encounter (HOSPITAL_COMMUNITY): Payer: Self-pay | Admitting: Physical Medicine and Rehabilitation

## 2021-09-01 ENCOUNTER — Inpatient Hospital Stay (HOSPITAL_COMMUNITY)
Admission: RE | Admit: 2021-09-01 | Discharge: 2021-09-15 | DRG: 559 | Disposition: A | Discharge status: Home / Self Care | Payer: No Typology Code available for payment source | Source: Intra-hospital | Attending: Physical Medicine and Rehabilitation | Admitting: Physical Medicine and Rehabilitation

## 2021-09-01 ENCOUNTER — Encounter (HOSPITAL_COMMUNITY): Payer: Self-pay | Admitting: Family Medicine

## 2021-09-01 DIAGNOSIS — I519 Heart disease, unspecified: Secondary | ICD-10-CM

## 2021-09-01 DIAGNOSIS — I429 Cardiomyopathy, unspecified: Secondary | ICD-10-CM | POA: Diagnosis present

## 2021-09-01 DIAGNOSIS — Z741 Need for assistance with personal care: Secondary | ICD-10-CM | POA: Diagnosis present

## 2021-09-01 DIAGNOSIS — E43 Unspecified severe protein-calorie malnutrition: Secondary | ICD-10-CM | POA: Diagnosis present

## 2021-09-01 DIAGNOSIS — Z6823 Body mass index (BMI) 23.0-23.9, adult: Secondary | ICD-10-CM

## 2021-09-01 DIAGNOSIS — F419 Anxiety disorder, unspecified: Secondary | ICD-10-CM | POA: Diagnosis present

## 2021-09-01 DIAGNOSIS — Z79899 Other long term (current) drug therapy: Secondary | ICD-10-CM | POA: Diagnosis not present

## 2021-09-01 DIAGNOSIS — K5900 Constipation, unspecified: Secondary | ICD-10-CM | POA: Diagnosis present

## 2021-09-01 DIAGNOSIS — Z7901 Long term (current) use of anticoagulants: Secondary | ICD-10-CM | POA: Diagnosis not present

## 2021-09-01 DIAGNOSIS — E1165 Type 2 diabetes mellitus with hyperglycemia: Secondary | ICD-10-CM | POA: Diagnosis present

## 2021-09-01 DIAGNOSIS — E119 Type 2 diabetes mellitus without complications: Secondary | ICD-10-CM

## 2021-09-01 DIAGNOSIS — G546 Phantom limb syndrome with pain: Secondary | ICD-10-CM

## 2021-09-01 DIAGNOSIS — Z7984 Long term (current) use of oral hypoglycemic drugs: Secondary | ICD-10-CM

## 2021-09-01 DIAGNOSIS — F17210 Nicotine dependence, cigarettes, uncomplicated: Secondary | ICD-10-CM | POA: Diagnosis present

## 2021-09-01 DIAGNOSIS — Z86718 Personal history of other venous thrombosis and embolism: Secondary | ICD-10-CM | POA: Diagnosis not present

## 2021-09-01 DIAGNOSIS — R4189 Other symptoms and signs involving cognitive functions and awareness: Secondary | ICD-10-CM | POA: Diagnosis present

## 2021-09-01 DIAGNOSIS — Z4781 Encounter for orthopedic aftercare following surgical amputation: Secondary | ICD-10-CM | POA: Diagnosis present

## 2021-09-01 DIAGNOSIS — I480 Paroxysmal atrial fibrillation: Secondary | ICD-10-CM | POA: Diagnosis not present

## 2021-09-01 DIAGNOSIS — S88112A Complete traumatic amputation at level between knee and ankle, left lower leg, initial encounter: Secondary | ICD-10-CM | POA: Diagnosis present

## 2021-09-01 DIAGNOSIS — E871 Hypo-osmolality and hyponatremia: Secondary | ICD-10-CM | POA: Diagnosis present

## 2021-09-01 DIAGNOSIS — Z89512 Acquired absence of left leg below knee: Secondary | ICD-10-CM

## 2021-09-01 DIAGNOSIS — I10 Essential (primary) hypertension: Secondary | ICD-10-CM | POA: Diagnosis present

## 2021-09-01 DIAGNOSIS — E1169 Type 2 diabetes mellitus with other specified complication: Secondary | ICD-10-CM | POA: Diagnosis not present

## 2021-09-01 DIAGNOSIS — D481 Neoplasm of uncertain behavior of connective and other soft tissue: Secondary | ICD-10-CM

## 2021-09-01 DIAGNOSIS — I4891 Unspecified atrial fibrillation: Secondary | ICD-10-CM | POA: Diagnosis present

## 2021-09-01 DIAGNOSIS — D62 Acute posthemorrhagic anemia: Secondary | ICD-10-CM

## 2021-09-01 DIAGNOSIS — M869 Osteomyelitis, unspecified: Secondary | ICD-10-CM

## 2021-09-01 DIAGNOSIS — M79606 Pain in leg, unspecified: Secondary | ICD-10-CM | POA: Diagnosis not present

## 2021-09-01 DIAGNOSIS — I739 Peripheral vascular disease, unspecified: Secondary | ICD-10-CM | POA: Diagnosis not present

## 2021-09-01 DIAGNOSIS — E1110 Type 2 diabetes mellitus with ketoacidosis without coma: Secondary | ICD-10-CM | POA: Diagnosis present

## 2021-09-01 DIAGNOSIS — I482 Chronic atrial fibrillation, unspecified: Secondary | ICD-10-CM

## 2021-09-01 LAB — CBC
HCT: 32.1 % — ABNORMAL LOW (ref 39.0–52.0)
Hemoglobin: 10.6 g/dL — ABNORMAL LOW (ref 13.0–17.0)
MCH: 28.2 pg (ref 26.0–34.0)
MCHC: 33 g/dL (ref 30.0–36.0)
MCV: 85.4 fL (ref 80.0–100.0)
Platelets: 279 10*3/uL (ref 150–400)
RBC: 3.76 MIL/uL — ABNORMAL LOW (ref 4.22–5.81)
RDW: 13.1 % (ref 11.5–15.5)
WBC: 7.4 10*3/uL (ref 4.0–10.5)
nRBC: 0 % (ref 0.0–0.2)

## 2021-09-01 LAB — BASIC METABOLIC PANEL
Anion gap: 5 (ref 5–15)
BUN: 16 mg/dL (ref 8–23)
CO2: 27 mmol/L (ref 22–32)
Calcium: 8.2 mg/dL — ABNORMAL LOW (ref 8.9–10.3)
Chloride: 101 mmol/L (ref 98–111)
Creatinine, Ser: 0.66 mg/dL (ref 0.61–1.24)
GFR, Estimated: >60 mL/min (ref >60)
Glucose, Bld: 268 mg/dL — ABNORMAL HIGH (ref 70–99)
Potassium: 4.5 mmol/L (ref 3.5–5.1)
Sodium: 133 mmol/L — ABNORMAL LOW (ref 135–145)

## 2021-09-01 LAB — GLUCOSE, CAPILLARY
Glucose-Capillary: 318 mg/dL — ABNORMAL HIGH (ref 70–99)
Glucose-Capillary: 158 mg/dL — ABNORMAL HIGH (ref 70–99)
Glucose-Capillary: 233 mg/dL — ABNORMAL HIGH (ref 70–99)
Glucose-Capillary: 198 mg/dL — ABNORMAL HIGH (ref 70–99)

## 2021-09-01 LAB — MAGNESIUM: Magnesium: 1.8 mg/dL (ref 1.7–2.4)

## 2021-09-01 MED ORDER — FLEET ENEMA 7-19 GM/118ML RE ENEM: 1.0000 | ENEMA | Freq: Once | RECTAL | Status: DC | PRN

## 2021-09-01 MED ORDER — JUVEN PO PACK: 1.0000 | PACK | Freq: Two times a day (BID) | ORAL | 0 refills | Status: DC

## 2021-09-01 MED ORDER — METOPROLOL TARTRATE 50 MG PO TABS: 50.0000 mg | ORAL_TABLET | Freq: Two times a day (BID) | ORAL | Status: DC

## 2021-09-01 MED ORDER — JUVEN PO PACK
1.0000 | PACK | Freq: Two times a day (BID) | ORAL | Status: DC
  Administered 2021-09-01 – 2021-09-15 (×27): 1 via ORAL
  Filled 2021-09-01 (×28): qty 1

## 2021-09-01 MED ORDER — OXYCODONE HCL 5 MG PO TABS
10.0000 mg | ORAL_TABLET | ORAL | Status: DC | PRN
  Administered 2021-09-02 – 2021-09-03 (×4): 10 mg via ORAL
  Administered 2021-09-03: 15 mg via ORAL
  Administered 2021-09-03: 10 mg via ORAL
  Administered 2021-09-03 – 2021-09-04 (×2): 15 mg via ORAL
  Administered 2021-09-04 (×3): 10 mg via ORAL
  Administered 2021-09-05: 15 mg via ORAL
  Administered 2021-09-05 (×3): 10 mg via ORAL
  Administered 2021-09-06 – 2021-09-15 (×43): 15 mg via ORAL
  Filled 2021-09-01 (×5): qty 3
  Filled 2021-09-01 (×4): qty 2
  Filled 2021-09-01 (×5): qty 3
  Filled 2021-09-01: qty 2
  Filled 2021-09-01 (×11): qty 3
  Filled 2021-09-01: qty 2
  Filled 2021-09-01 (×4): qty 3
  Filled 2021-09-01: qty 2
  Filled 2021-09-01 (×2): qty 3
  Filled 2021-09-01 (×2): qty 2
  Filled 2021-09-01 (×2): qty 3
  Filled 2021-09-01: qty 2
  Filled 2021-09-01 (×10): qty 3
  Filled 2021-09-01: qty 2
  Filled 2021-09-01 (×8): qty 3

## 2021-09-01 MED ORDER — MAGNESIUM OXIDE -MG SUPPLEMENT 400 (240 MG) MG PO TABS
400.0000 mg | ORAL_TABLET | Freq: Two times a day (BID) | ORAL | Status: DC
  Administered 2021-09-01 – 2021-09-15 (×28): 400 mg via ORAL
  Filled 2021-09-01 (×28): qty 1

## 2021-09-01 MED ORDER — BISACODYL 10 MG RE SUPP
10.0000 mg | Freq: Every day | RECTAL | Status: DC | PRN
Start: 2021-09-01 — End: 2021-09-15
  Administered 2021-09-02: 10 mg via RECTAL
  Filled 2021-09-01 (×2): qty 1

## 2021-09-01 MED ORDER — DILTIAZEM HCL ER 120 MG PO CP12: 120.0000 mg | ORAL_CAPSULE | Freq: Two times a day (BID) | ORAL | Status: DC

## 2021-09-01 MED ORDER — INSULIN ASPART 100 UNIT/ML IJ SOLN
4.0000 [IU] | Freq: Three times a day (TID) | INTRAMUSCULAR | Status: DC
  Administered 2021-09-01 – 2021-09-10 (×27): 4 [IU] via SUBCUTANEOUS

## 2021-09-01 MED ORDER — DIPHENHYDRAMINE HCL 12.5 MG/5ML PO ELIX: 12.5000 mg | ORAL_SOLUTION | Freq: Four times a day (QID) | ORAL | Status: DC | PRN

## 2021-09-01 MED ORDER — ASCORBIC ACID 1000 MG PO TABS: 1000.0000 mg | ORAL_TABLET | Freq: Every day | ORAL | Status: DC

## 2021-09-01 MED ORDER — SACUBITRIL-VALSARTAN 24-26 MG PO TABS
1.0000 | ORAL_TABLET | Freq: Two times a day (BID) | ORAL | Status: DC
  Administered 2021-09-01 – 2021-09-15 (×28): 1 via ORAL
  Filled 2021-09-01 (×28): qty 1

## 2021-09-01 MED ORDER — TRAMADOL HCL 50 MG PO TABS
50.0000 mg | ORAL_TABLET | Freq: Four times a day (QID) | ORAL | Status: DC | PRN
  Administered 2021-09-07 – 2021-09-14 (×4): 50 mg via ORAL
  Filled 2021-09-01 (×4): qty 1

## 2021-09-01 MED ORDER — GLUCERNA SHAKE PO LIQD
237.0000 mL | Freq: Three times a day (TID) | ORAL | Status: DC
  Administered 2021-09-01 – 2021-09-04 (×9): 237 mL via ORAL
  Filled 2021-09-01: qty 237

## 2021-09-01 MED ORDER — ASCORBIC ACID 500 MG PO TABS
500.0000 mg | ORAL_TABLET | Freq: Two times a day (BID) | ORAL | Status: DC
  Administered 2021-09-01 – 2021-09-15 (×28): 500 mg via ORAL
  Filled 2021-09-01 (×28): qty 1

## 2021-09-01 MED ORDER — INSULIN DETEMIR 100 UNIT/ML ~~LOC~~ SOLN
15.0000 [IU] | Freq: Two times a day (BID) | SUBCUTANEOUS | Status: DC
  Administered 2021-09-01 – 2021-09-15 (×27): 15 [IU] via SUBCUTANEOUS
  Filled 2021-09-01 (×30): qty 0.15

## 2021-09-01 MED ORDER — INSULIN DETEMIR 100 UNIT/ML ~~LOC~~ SOLN: 15.0000 [IU] | Freq: Two times a day (BID) | SUBCUTANEOUS | Status: DC

## 2021-09-01 MED ORDER — APIXABAN 5 MG PO TABS
5.0000 mg | ORAL_TABLET | Freq: Two times a day (BID) | ORAL | Status: DC
  Administered 2021-09-01 – 2021-09-15 (×28): 5 mg via ORAL
  Filled 2021-09-01 (×28): qty 1

## 2021-09-01 MED ORDER — GUAIFENESIN-DM 100-10 MG/5ML PO SYRP: 15.0000 mL | ORAL_SOLUTION | ORAL | 0 refills | Status: DC | PRN

## 2021-09-01 MED ORDER — ZINC SULFATE 220 (50 ZN) MG PO CAPS: 220.0000 mg | ORAL_CAPSULE | Freq: Every day | ORAL | Status: DC

## 2021-09-01 MED ORDER — OXYCODONE HCL 5 MG PO TABS: 5.0000 mg | ORAL_TABLET | ORAL | 0 refills | Status: DC | PRN

## 2021-09-01 MED ORDER — TRAZODONE HCL 50 MG PO TABS
25.0000 mg | ORAL_TABLET | Freq: Every evening | ORAL | Status: DC | PRN
  Administered 2021-09-01 – 2021-09-13 (×12): 50 mg via ORAL
  Filled 2021-09-01 (×11): qty 1

## 2021-09-01 MED ORDER — MAGNESIUM OXIDE -MG SUPPLEMENT 400 (240 MG) MG PO TABS: 400.0000 mg | ORAL_TABLET | Freq: Two times a day (BID) | ORAL | Status: DC

## 2021-09-01 MED ORDER — GUAIFENESIN-DM 100-10 MG/5ML PO SYRP: 5.0000 mL | ORAL_SOLUTION | Freq: Four times a day (QID) | ORAL | Status: DC | PRN

## 2021-09-01 MED ORDER — POLYETHYLENE GLYCOL 3350 17 G PO PACK
17.0000 g | PACK | Freq: Every day | ORAL | Status: DC | PRN
  Administered 2021-09-11: 17 g via ORAL
  Filled 2021-09-01: qty 1

## 2021-09-01 MED ORDER — OXYCODONE HCL 10 MG PO TABS
10.0000 mg | ORAL_TABLET | ORAL | Status: DC | PRN
Start: 2021-09-01 — End: 2021-09-15

## 2021-09-01 MED ORDER — GLUCERNA SHAKE PO LIQD: 237.0000 mL | Freq: Three times a day (TID) | ORAL | 0 refills | Status: DC

## 2021-09-01 MED ORDER — PROCHLORPERAZINE MALEATE 5 MG PO TABS: 5.0000 mg | ORAL_TABLET | Freq: Four times a day (QID) | ORAL | Status: DC | PRN

## 2021-09-01 MED ORDER — INSULIN ASPART 100 UNIT/ML IJ SOLN: 0.0000 [IU] | Freq: Every day | INTRAMUSCULAR | Status: DC

## 2021-09-01 MED ORDER — SACUBITRIL-VALSARTAN 24-26 MG PO TABS: 1.0000 | ORAL_TABLET | Freq: Two times a day (BID) | ORAL | Status: DC

## 2021-09-01 MED ORDER — PANTOPRAZOLE SODIUM 40 MG PO TBEC
40.0000 mg | DELAYED_RELEASE_TABLET | Freq: Every day | ORAL | Status: DC
  Administered 2021-09-02 – 2021-09-15 (×14): 40 mg via ORAL
  Filled 2021-09-01 (×14): qty 1

## 2021-09-01 MED ORDER — ADULT MULTIVITAMIN W/MINERALS CH
1.0000 | ORAL_TABLET | Freq: Every morning | ORAL | Status: DC
  Administered 2021-09-02 – 2021-09-14 (×13): 1 via ORAL
  Filled 2021-09-01 (×13): qty 1

## 2021-09-01 MED ORDER — DILTIAZEM HCL ER 60 MG PO CP12
120.0000 mg | ORAL_CAPSULE | Freq: Two times a day (BID) | ORAL | Status: DC
  Administered 2021-09-01 – 2021-09-06 (×10): 120 mg via ORAL
  Filled 2021-09-01 (×10): qty 2

## 2021-09-01 MED ORDER — PROCHLORPERAZINE 25 MG RE SUPP: 12.5000 mg | Freq: Four times a day (QID) | RECTAL | Status: DC | PRN

## 2021-09-01 MED ORDER — METOPROLOL TARTRATE 50 MG PO TABS
50.0000 mg | ORAL_TABLET | Freq: Two times a day (BID) | ORAL | Status: DC
  Administered 2021-09-01 – 2021-09-10 (×16): 50 mg via ORAL
  Filled 2021-09-01 (×20): qty 1

## 2021-09-01 MED ORDER — DOCUSATE SODIUM 100 MG PO CAPS
100.0000 mg | ORAL_CAPSULE | Freq: Every day | ORAL | Status: DC
  Administered 2021-09-02 – 2021-09-15 (×14): 100 mg via ORAL
  Filled 2021-09-01 (×14): qty 1

## 2021-09-01 MED ORDER — GABAPENTIN 100 MG PO CAPS
100.0000 mg | ORAL_CAPSULE | Freq: Three times a day (TID) | ORAL | Status: DC
  Administered 2021-09-01 – 2021-09-02 (×3): 100 mg via ORAL
  Filled 2021-09-01 (×3): qty 1

## 2021-09-01 MED ORDER — ACETAMINOPHEN 325 MG PO TABS
325.0000 mg | ORAL_TABLET | ORAL | Status: DC | PRN
  Administered 2021-09-14: 650 mg via ORAL
  Filled 2021-09-01 (×2): qty 2

## 2021-09-01 MED ORDER — PANTOPRAZOLE SODIUM 40 MG PO TBEC: 40.0000 mg | DELAYED_RELEASE_TABLET | Freq: Every day | ORAL | Status: DC

## 2021-09-01 MED ORDER — ZINC SULFATE 220 (50 ZN) MG PO CAPS
220.0000 mg | ORAL_CAPSULE | Freq: Every day | ORAL | Status: AC
  Administered 2021-09-02 – 2021-09-08 (×7): 220 mg via ORAL
  Filled 2021-09-01 (×7): qty 1

## 2021-09-01 MED ORDER — NICOTINE 21 MG/24HR TD PT24: 21.0000 mg | MEDICATED_PATCH | Freq: Every day | TRANSDERMAL | 0 refills | Status: AC

## 2021-09-01 MED ORDER — INSULIN ASPART 100 UNIT/ML IJ SOLN: 0.0000 [IU] | Freq: Three times a day (TID) | INTRAMUSCULAR | Status: DC

## 2021-09-01 MED ORDER — NICOTINE 21 MG/24HR TD PT24
21.0000 mg | MEDICATED_PATCH | Freq: Every day | TRANSDERMAL | Status: DC
  Administered 2021-09-02 – 2021-09-15 (×14): 21 mg via TRANSDERMAL
  Filled 2021-09-01 (×14): qty 1

## 2021-09-01 MED ORDER — ALUM & MAG HYDROXIDE-SIMETH 200-200-20 MG/5ML PO SUSP
30.0000 mL | ORAL | Status: DC | PRN
Start: 2021-09-01 — End: 2021-09-15

## 2021-09-01 MED ORDER — PROCHLORPERAZINE EDISYLATE 10 MG/2ML IJ SOLN: 5.0000 mg | Freq: Four times a day (QID) | INTRAMUSCULAR | Status: DC | PRN

## 2021-09-01 MED ORDER — ACETAMINOPHEN 500 MG PO TABS
1000.0000 mg | ORAL_TABLET | Freq: Once | ORAL | Status: AC
  Administered 2021-09-01: 1000 mg via ORAL
  Filled 2021-09-01: qty 2

## 2021-09-01 MED ORDER — INSULIN ASPART 100 UNIT/ML IJ SOLN
0.0000 [IU] | Freq: Three times a day (TID) | INTRAMUSCULAR | Status: DC
  Administered 2021-09-01 – 2021-09-02 (×2): 3 [IU] via SUBCUTANEOUS
  Administered 2021-09-02: 7 [IU] via SUBCUTANEOUS
  Administered 2021-09-02: 5 [IU] via SUBCUTANEOUS
  Administered 2021-09-03 (×2): 2 [IU] via SUBCUTANEOUS
  Administered 2021-09-03: 5 [IU] via SUBCUTANEOUS
  Administered 2021-09-04: 2 [IU] via SUBCUTANEOUS
  Administered 2021-09-04: 1 [IU] via SUBCUTANEOUS
  Administered 2021-09-04 – 2021-09-05 (×2): 2 [IU] via SUBCUTANEOUS
  Administered 2021-09-05: 3 [IU] via SUBCUTANEOUS
  Administered 2021-09-05: 2 [IU] via SUBCUTANEOUS
  Administered 2021-09-06: 1 [IU] via SUBCUTANEOUS
  Administered 2021-09-06: 2 [IU] via SUBCUTANEOUS
  Administered 2021-09-07 – 2021-09-10 (×4): 1 [IU] via SUBCUTANEOUS
  Administered 2021-09-11: 2 [IU] via SUBCUTANEOUS
  Administered 2021-09-13 (×2): 1 [IU] via SUBCUTANEOUS

## 2021-09-01 MED ORDER — INSULIN ASPART 100 UNIT/ML IJ SOLN: 4.0000 [IU] | Freq: Three times a day (TID) | INTRAMUSCULAR | Status: DC

## 2021-09-01 NOTE — Progress Notes (Signed)
Inpatient Rehabilitation Admission Medication Review by a Pharmacist  A complete drug regimen review was completed for this patient to identify any potential clinically significant medication issues.  High Risk Drug Classes Is patient taking? Indication by Medication  Antipsychotic Yes Prochlorperazine: nausea   Anticoagulant Yes Apixaban: hx DVT / stroke prevention with Afib  Antibiotic No   Opioid Yes Oxycodone / tramadol: acute pain  Antiplatelet No   Hypoglycemics/insulin Yes Levemir / Novolog: DM  Vasoactive Medication Yes Diltiazem / metoprolol: Afib Entresto: LV dysfunction  Chemotherapy No   Other Yes Vitamin C / zinc: wound healing Magnesium: supplement  Nicotine patch: smoking cessation Pantoprazole: stress prophylaxis Gabapentin: phantom limb pain Trazodone: sleep     Type of Medication Issue Identified Description of Issue Recommendation(s)  Drug Interaction(s) (clinically significant)     Duplicate Therapy     Allergy     No Medication Administration End Date     Incorrect Dose     Additional Drug Therapy Needed     Significant med changes from prior encounter (inform family/care partners about these prior to discharge). PTA meds: Metformin - stopped on discharge   Other  Pantoprazole was started post-op, did not take PTA Consider stopping this medication    Clinically significant medication issues were identified that warrant physician communication and completion of prescribed/recommended actions by midnight of the next day:  No   Time spent performing this drug regimen review (minutes):  20 min   Renold Genta, PharmD, BCPS Clinical Pharmacist Clinical phone for 09/01/2021 until 3p is [phone] 09/01/2021 2:58 PM

## 2021-09-01 NOTE — Discharge Summary (Signed)
Physician Discharge Summary   Patient: Randall Coleman MRN: [id number] DOB: May 05, 1950  Admit date:     08/25/2021  Discharge date: 09/01/21  Discharge Physician: Corrie Mckusick Ellarae Nevitt   PCP: Pcp, No   Recommendations at discharge:   Follow-up with orthopedics Dr Sharol Given as outpatient Follow-up at the atrial fibrillation clinic, internal referral has been made. Metformin has been discontinued and patient is on insulin regimen at this time.   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 male with medical history of type 2 diabetes, hypertension, history of DVT on Eliquis presented to hospital with left foot infection with swelling and redness of the foot and had foul-smelling discharge and ambulatory dysfunction.  In the ED, patient was afebrile with mild leukocytosis and hyponatremia.  Lactate was elevated at 2.0.  Chest x-ray without any obvious infiltrate.  X-ray of the foot showed findings suggestive of osteomyelitis involving the distal third digit.  Patient was given 2 L of IV fluids, vancomycin and cefepime and Dr. Sharol Given was notified.  Patient was then admitted hospital for further evaluation and treatment.  During hospitalization, patient was seen by orthopedics and underwent transmetatarsal amputation on 08/26/2021 with findings of abscess in the hindfoot.  Patient subsequently underwent below-knee amputation on 08/29/21. At this time, patient has been considered for CIR.  See assessment plan for details.     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  Patient had initially received vanc/cefepime, flagyl which was  discontinued due to margins being clear as per orthopedic recommendation.  ABI of the left lower extremity was normal on 08/06/2020. Physical therapy has recommended CIR at this time.  Chronic atrial fibrillation with RVR (HCC) Controlled at this time.  Initially needed Cardizem drip.  Now on oral Cardizem.   2D echocardiogram with LV ejection fraction of 40 to 45% with global hypokinesis.   Continue metoprolol.  Cardiology followed the patient during hospitalization and will continue Entresto, Eliquis on discharge.  DKA, type 2 with hyperglycemia Likely secondary to sepsis from left foot osteomyelitis.  Received IV fluids and insulin while in the hospital.  Latest hemoglobin A1c of 7.9 in 04/2021.  Repeat hemoglobin A1c at 10.7 on 08/25/2021.  Diabetic coordinator on board and insulin has been introduced during hospitalization.  Currently on long-acting and mealtime and sliding scale insulin and will continue on discharge to rehab..  Patient was on metformin at home.  Patient would likely benefit from metformin and long-acting insulin on discharge from rehabilitation.  History of DVT of lower extremity  Continue Eliquis for anticoagulation.    Essential hypertension Not on medications at home.  Currently on Cardizem and metoprolol.  Blood pressure is stable.  History of cigarette smoking.   On nicotine patch.  We will continue on discharge  Mild hyponatremia.  Latest sodium of 133.  Recommend periodic  Hypomagnesemia.  On oral magnesium oxide.  Latest magnesium of 1.8.  Will continue oral supplement on discharge.  Nutrition Status:Body mass index is 23.75 kg/m.  Present on admission.  We will continue supplements on discharge. Nutrition Problem: Severe Malnutrition Etiology: chronic illness (uncontrolled DM with A1C=10.7) Signs/Symptoms: severe muscle depletion, severe fat depletion Interventions: Ensure Enlive (each supplement provides 350kcal and 20 grams of protein), MVI, Juven  Consultants: Orthopedics Dr Sharol Given. Procedures performed:  Left transmetatarsal amputation on 08/26/2021. Left below-knee amputation with wound VAC on 08/29/2021   Disposition: Rehabilitation facility Diet recommendation:  Discharge Diet Orders (From admission, onward)     Start     Ordered   09/01/21 0000  Diet Carb Modified        09/01/21 1032           Carb modified diet DISCHARGE MEDICATION: Allergies as of 09/01/2021   No Known Allergies      Medication List     STOP taking these medications    metFORMIN 1000 MG tablet Commonly known as: GLUCOPHAGE       TAKE these medications    Acetaminophen Extra Strength 500 MG tablet Generic drug: acetaminophen Take 2 tablets (1,000 mg total) by mouth every 8 (eight) hours. What changed:  when to take this reasons to take this   apixaban 5 MG Tabs tablet Commonly known as: ELIQUIS Take 5 mg by mouth 2 (two) times daily.   ascorbic acid 1000 MG tablet Commonly known as: VITAMIN C Take 1 tablet (1,000 mg total) by mouth daily. Start taking on: September 02, 2021   bisacodyl 5 MG EC tablet Generic drug: bisacodyl Take 1 tablet (5 mg total) by mouth daily as needed for moderate constipation.   diltiazem 120 MG 12 hr capsule Commonly known as: CARDIZEM SR Take 1 capsule (120 mg total) by mouth every 12 (twelve) hours.    docusate sodium 100 MG capsule Commonly known as: COLACE Take 1 capsule (100 mg total) by mouth daily.   guaiFENesin-dextromethorphan 100-10 MG/5ML syrup Commonly known as: ROBITUSSIN DM Take 15 mLs by mouth every 4 (four) hours as needed for cough.   insulin aspart 100 UNIT/ML injection Commonly known as: novoLOG Inject 4 Units into the skin 3 (three) times daily with meals.   insulin aspart 100 UNIT/ML injection Commonly known as: novoLOG Inject 0-9 Units into the skin 3 (three) times daily with meals.   insulin detemir 100 UNIT/ML injection Commonly known as: LEVEMIR Inject 0.15 mLs (15 Units total) into the skin 2 (two) times daily.   magnesium oxide 400 (240 Mg) MG tablet Commonly known as: MAG-OX Take 1 tablet (400 mg total) by mouth 2 (two) times daily.   metoprolol tartrate 50 MG tablet Commonly known as: LOPRESSOR Take 1 tablet (50 mg total) by mouth 2 (two) times daily.   multivitamin with minerals Tabs tablet Take 1 tablet by mouth every morning.   nicotine 21 mg/24hr patch Commonly known as: NICODERM CQ - dosed in mg/24 hours Place 1 patch (21 mg total) onto the skin daily. Start taking on: September 02, 2021   nutrition supplement (JUVEN) Pack Take 1 packet by mouth 2 (two) times daily.  feeding supplement (GLUCERNA SHAKE) Liqd Take 237 mLs by mouth 3 (three) times daily between meals.   oxyCODONE 5 MG immediate release tablet Commonly known as: Oxy IR/ROXICODONE Take 1 tablet (5 mg total) by mouth every 4 (four) hours as needed for moderate pain or severe pain (pain score 4-6).   Oxycodone HCl 10 MG Tabs Take 1 tablet (10 mg total) by mouth every 4 (four) hours as needed for severe pain (pain score 7-10).   pantoprazole 40 MG tablet Commonly known as: PROTONIX Take 1 tablet (40 mg total) by mouth daily. Start taking on: September 02, 2021   polyethylene glycol powder 17 GM/SCOOP powder Commonly known as: GLYCOLAX/MIRALAX Take 17 g by mouth daily as needed  for mild constipation.   sacubitril-valsartan 24-26 MG Commonly known as: ENTRESTO Take 1 tablet by mouth 2 (two) times daily.   zinc sulfate 220 (50 Zn) MG capsule Take 1 capsule (220 mg total) by mouth daily. Start taking on: September 02, 2021               Discharge Care Instructions  (From admission, onward)           Start     Ordered   09/01/21 0000  Discharge wound care:       Comments: Continue wound vac   09/01/21 1032            Follow-up Information     [name], MD Follow up.   Specialty: Orthopedic Surgery Contact information: [place] [address] [phone]                Subjective Today, patient was seen and examined at bedside.  Denies any nausea vomiting fever chills or overt pain.  Discharge Exam: Filed Weights   08/25/21 1438 08/29/21 0729  Weight: 83.9 kg 83.9 kg       09/01/2021    7:57 AM 09/01/2021    4:33 AM 08/31/2021   11:45 PM  Vitals with BMI  Systolic 751 025 852  Diastolic 73 74 81  Pulse 77 76 74  General:  Average built, not in obvious distress, appears older than stated age HENT:   No scleral pallor or icterus noted. Oral mucosa is moist.  Chest:  Clear breath sounds.  Diminished breath sounds bilaterally. No crackles or wheezes.  CVS: S1 &S2 heard. No murmur.  Regular rate and rhythm. Abdomen: Soft, nontender, nondistended.  Bowel sounds are heard.   Extremities: Left below-knee amputation with stump. Psych: Alert, awake and oriented, normal mood CNS:  No cranial nerve deficits.  Power equal in all extremities.   Skin: Warm and dry, left below-knee amputation  Condition at discharge: good  The results of significant diagnostics from this hospitalization (including imaging, microbiology, ancillary and laboratory) are listed below for reference.   Imaging Studies: ECHOCARDIOGRAM COMPLETE  Result Date: 08/26/2021    ECHOCARDIOGRAM REPORT   Patient Name:   LEJEND DALBY Date of Exam:  08/26/2021 Medical Rec #:  [id number]      Height:       74.0 in Accession #:    [id number]     Weight:       185.0 lb Date of Birth:  03-Apr-1950       BSA:          2.102 m Patient Age:    12 years       BP:           91/62 mmHg Patient Gender:  M              HR:           90 bpm. Exam Location:  Inpatient Procedure: 2D Echo, Cardiac Doppler and Color Doppler Indications:    Atrial Fibrillation  History:        Patient has prior history of Echocardiogram examinations, most                 recent 12/10/2018. Arrythmias:Atrial Fibrillation; Risk                 Factors:Hypertension and Diabetes.  Sonographer:    Jefferey Pica Referring Phys: [id number] [place]  1. Left ventricular ejection fraction, by estimation, is 40 to 45%. The left ventricle has mildly decreased function. The left ventricle demonstrates global hypokinesis. There is mild left ventricular hypertrophy. Left ventricular diastolic parameters are indeterminate.  2. Right ventricular systolic function is normal. The right ventricular size is mildly enlarged. There is mildly elevated pulmonary artery systolic pressure.  3. Left atrial size was moderately dilated.  4. Right atrial size was mildly dilated.  5. The mitral valve is normal in structure. Trivial mitral valve regurgitation. No evidence of mitral stenosis.  6. The aortic valve is normal in structure. Aortic valve regurgitation is not visualized. No aortic stenosis is present.  7. The inferior vena cava is normal in size with greater than 50% respiratory variability, suggesting right atrial pressure of 3 mmHg. Comparison(s): The left ventricular function is worsened. FINDINGS  Left Ventricle: Left ventricular ejection fraction, by estimation, is 40 to 45%. The left ventricle has mildly decreased function. The left ventricle demonstrates global hypokinesis. The left ventricular internal cavity size was normal in size. There is  mild left ventricular hypertrophy. Left ventricular  diastolic parameters are indeterminate. Right Ventricle: The right ventricular size is mildly enlarged. No increase in right ventricular wall thickness. Right ventricular systolic function is normal. There is mildly elevated pulmonary artery systolic pressure. The tricuspid regurgitant velocity is 2.36 m/s, and with an assumed right atrial pressure of 15 mmHg, the estimated right ventricular systolic pressure is 44.0 mmHg. Left Atrium: Left atrial size was moderately dilated. Right Atrium: Right atrial size was mildly dilated. Pericardium: There is no evidence of pericardial effusion. Mitral Valve: The mitral valve is normal in structure. Trivial mitral valve regurgitation. No evidence of mitral valve stenosis. Tricuspid Valve: The tricuspid valve is normal in structure. Tricuspid valve regurgitation is mild . No evidence of tricuspid stenosis. Aortic Valve: The aortic valve is normal in structure. Aortic valve regurgitation is not visualized. No aortic stenosis is present. Aortic valve peak gradient measures 4.3 mmHg. Pulmonic Valve: The pulmonic valve was normal in structure. Pulmonic valve regurgitation is not visualized. No evidence of pulmonic stenosis. Aorta: The aortic root is normal in size and structure. Venous: The inferior vena cava is normal in size with greater than 50% respiratory variability, suggesting right atrial pressure of 3 mmHg. IAS/Shunts: No atrial level shunt detected by color flow Doppler.  LEFT VENTRICLE PLAX 2D LVIDd:         3.90 cm LVIDs:         3.10 cm LV PW:         1.20 cm LV IVS:        1.20 cm LVOT diam:     2.40 cm LV SV:         70 LV SV Index:   33 LVOT Area:     4.52  cm  RIGHT VENTRICLE          IVC RV Basal diam:  3.60 cm  IVC diam: 2.90 cm RV Mid diam:    4.10 cm LEFT ATRIUM              Index        RIGHT ATRIUM           Index LA diam:        4.60 cm  2.19 cm/m   RA Area:     23.60 cm LA Vol (A2C):   96.9 ml  46.09 ml/m  RA Volume:   76.50 ml  36.39 ml/m LA Vol (A4C):    103.0 ml 48.99 ml/m LA Biplane Vol: 100.0 ml 47.56 ml/m  AORTIC VALVE                 PULMONIC VALVE AV Area (Vmax): 3.69 cm     PV Vmax:       0.75 m/s AV Vmax:        103.16 cm/s  PV Peak grad:  2.3 mmHg AV Peak Grad:   4.3 mmHg LVOT Vmax:      84.20 cm/s LVOT Vmean:     55.925 cm/s LVOT VTI:       0.154 m  AORTA Ao Root diam: 3.70 cm Ao Asc diam:  3.40 cm TRICUSPID VALVE TR Peak grad:   22.3 mmHg TR Vmax:        236.00 cm/s  SHUNTS Systemic VTI:  0.15 m Systemic Diam: 2.40 cm Candee Furbish MD Electronically signed by Candee Furbish MD Signature Date/Time: 08/26/2021/12:52:09 PM    Final    DG Foot 2 Views Left  Result Date: 08/25/2021 CLINICAL DATA:  Surgery on foot tomorrow for toe removal.  Pain. EXAM: LEFT FOOT - 2 VIEW COMPARISON:  08/07/2021 FINDINGS: Prior resection of the first and second phalanges. Soft tissue swelling and minimal soft tissue gas about the medial forefoot. Ill definition of the second distal metatarsal is relatively similar. There is osseous irregularity involving the distal third phalanx with increased fragmentation. The middle phalanx of the third digit demonstrates osteolysis, new. Small calcaneal spur. IMPRESSION: Findings suspicious for osteomyelitis involving the distal third digit, progressive. Cannot exclude osteomyelitis involving the distal portion of the second metatarsal. Electronically Signed   By: Abigail Miyamoto M.D.   On: 08/25/2021 12:07   DG Chest Port 1 View  Result Date: 08/25/2021 CLINICAL DATA:  Sepsis. EXAM: PORTABLE CHEST 1 VIEW COMPARISON:  04/18/2021 FINDINGS: The heart size and mediastinal contours are within normal limits. Both lungs are clear. IMPRESSION: No active disease. Electronically Signed   By: Marlaine Hind M.D.   On: 08/25/2021 12:03   DG Foot Complete Left  Result Date: 08/07/2021 CLINICAL DATA:  Possible osteo 3rd digit EXAM: LEFT FOOT - COMPLETE 3+ VIEW COMPARISON:  LEFT foot XRs, most recently 04/06/2021. FINDINGS: Interval amputation of the  LEFT second phalanges. Similar appearance of prior LEFT first filling amputation. Soft tissue thickening of LEFT third digit with demineralization, erosion or periosteal change involving distal phalanx. See key image. No acute fracture or dislocation. Small plantar enthesophyte. Vascular calcifications. IMPRESSION: Soft tissue thickening with underlying radiographic changes suspicious for osteomyelitis of the LEFT third distal phalanx. Electronically Signed   By: Michaelle Birks M.D.   On: 08/07/2021 13:49    Microbiology: Results for orders placed or performed during the hospital encounter of 08/25/21  Blood Culture (routine x 2)     Status:  None   Collection Time: 08/25/21 11:37 AM   Specimen: BLOOD  Result Value Ref Range Status   Specimen Description BLOOD RIGHT ARM  Final   Special Requests   Final    BOTTLES DRAWN AEROBIC AND ANAEROBIC Blood Culture adequate volume   Culture   Final    NO GROWTH 5 DAYS Performed at [facility], [address] [place], [place] [address]    Report Status 08/30/2021 FINAL  Final  Blood Culture (routine x 2)     Status: None   Collection Time: 08/25/21 11:37 AM   Specimen: BLOOD  Result Value Ref Range Status   Specimen Description BLOOD RIGHT FOREARM  Final   Special Requests   Final    BOTTLES DRAWN AEROBIC AND ANAEROBIC Blood Culture adequate volume   Culture   Final    NO GROWTH 5 DAYS Performed at [facility], [place] [address], [place], [place] [address]    Report Status 08/30/2021 FINAL  Final  Urine Culture     Status: None   Collection Time: 08/25/21  2:00 PM   Specimen: In/Out Cath Urine  Result Value Ref Range Status   Specimen Description IN/OUT CATH URINE  Final   Special Requests NONE  Final   Culture   Final    NO GROWTH Performed at [facility], [place] [address], [place], [place] [address]    Report Status 08/26/2021 FINAL  Final  Surgical PCR screen     Status: None   Collection Time: 08/25/21  5:09 PM    Specimen: Nasal Mucosa; Nasal Swab  Result Value Ref Range Status   MRSA, PCR NEGATIVE NEGATIVE Final   Staphylococcus aureus NEGATIVE NEGATIVE Final    Comment: (NOTE) The Xpert SA Assay (FDA approved for NASAL specimens in patients 81 years of age and older), is one component of a comprehensive surveillance program. It is not intended to diagnose infection nor to guide or monitor treatment. Performed at [facility], [place] [address] [place], [place] [address]     Labs: CBC: Recent Labs  Lab 08/25/21 1139 08/26/21 0657 08/28/21 0201 08/29/21 0121 08/30/21 0049 08/31/21 0047 09/01/21 0231  WBC 14.0*   < > 7.3 6.6 8.5 7.4 7.4  NEUTROABS 12.1*  --   --   --   --   --   --   HGB 14.3   < > 10.2* 11.0* 10.7* 10.5* 10.6*  HCT 43.7   < > 31.0* 31.9* 31.3* 31.5* 32.1*  MCV 84.4   < > 82.9 81.8 82.8 83.6 85.4  PLT 292   < > 217 214 248 243 279   < > = values in this interval not displayed.   Basic Metabolic Panel: Recent Labs  Lab 08/27/21 0401 08/28/21 0201 08/29/21 0121 08/30/21 0049 09/01/21 0231  NA 130* 133* 135 133* 133*  K 4.0 4.3 4.0 4.4 4.5  CL 101 104 104 102 101  CO2 '23 24 25 25 27  '$ GLUCOSE 331* 231* 318* 308* 268*  BUN 18 12 7* 13 16  CREATININE 0.84 0.73 0.64 0.59* 0.66  CALCIUM 8.1* 7.8* 8.1* 8.0* 8.2*  MG 1.6* 1.9 1.7 1.6* 1.8   Liver Function Tests: Recent Labs  Lab 08/25/21 1139  AST 19  ALT 20  ALKPHOS 106  BILITOT 1.2  PROT 6.8  ALBUMIN 2.7*   CBG: Recent Labs  Lab 08/31/21 0820 08/31/21 1139 08/31/21 1623 08/31/21 2100 09/01/21 0754  GLUCAP 267*  340* 211* 162* 318*    Discharge time spent: greater than 30 minutes.  Signed: Flora Lipps, MD [facility] 09/01/2021

## 2021-09-01 NOTE — Progress Notes (Signed)
Physical Therapy Treatment Patient Details Name: Randall Coleman MRN: [id number] DOB: [birth year] Today's Date: 09/01/2021   History of Present Illness Pt is a 71 y.o. 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 Significant medical history: T2DM, HTN,  hx of DVT on eliquis.    PT Comments    Pt received supine and agreeable to session with continued progress towards goals. Pt able to demonstrate all bed mobility with min guard for safety. Pt requiring up to min assist for transfers to standing with repeated cues needed for safe hand placement with pt demonstrating good power on stand and light tactile cues for weight shifting to and over RLE with RW. Pt able to progress gait distance and tolerance for mobility with RW and mod assist for RW management. Education reinforced re; LLE positioning, limb protector brace, and light tactile activities to residual limb 2/2 phantom pains with pt able to demonstrate and verbalize understanding. Current plan remains appropriate to address deficits and maximize functional independence and decrease caregiver burden. Patient will continue to benefit from skilled physical therapy services to further improve independence with functional mobility.   Recommendations for follow up therapy are one component of a multi-disciplinary discharge planning process, led by the attending physician.  Recommendations may be updated based on patient status, additional functional criteria and insurance authorization.  Follow Up Recommendations  Acute inpatient rehab (3hours/day)     Assistance Recommended at Discharge Frequent or constant Supervision/Assistance  Patient can return home with the following A lot of help with walking and/or transfers;A lot of help with bathing/dressing/bathroom   Equipment Recommendations  BSC/3in1;Wheelchair (measurements PT);Wheelchair cushion (measurements PT)    Recommendations for Other Services Rehab consult     Precautions /  Restrictions Precautions Precautions: Fall Precaution Comments: wound vac Required Braces or Orthoses: Other Brace Other Brace: L limb protector Restrictions Weight Bearing Restrictions: Yes LLE Weight Bearing: Non weight bearing     Mobility  Bed Mobility Overal bed mobility: Needs Assistance Bed Mobility: Supine to Sit     Supine to sit: Min guard     General bed mobility comments: Close min guard for safety, exiting towards left side of bed    Transfers Overall transfer level: Needs assistance Equipment used: Rolling walker (2 wheels) Transfers: Sit to/from Stand Sit to Stand: From elevated surface, Min assist, Min guard           General transfer comment: min assist to steady on rise progressing to min guard, x3 from EOB with cues for hand placement each time, x1 from low recliner with min guard for safety with good power up    Ambulation/Gait Ambulation/Gait assistance: Mod assist Gait Distance (Feet): 7 Feet Assistive device: Rolling walker (2 wheels) Gait Pattern/deviations:  (hop-to on RLE) Gait velocity: slow   Pre-gait activities: Standing pre-gait activities with weight shift, hoping forward and back x5 at EOB General Gait Details: Mod assist for RW control in forward direction Adequte foot clearance.  Slight assist with balance, VC throughout. Mod assist with turns and educated on backwards steps with assistive device. Somewhat anxious throughout but performed well.   Stairs             Wheelchair Mobility    Modified Rankin (Stroke Patients Only)       Balance Overall balance assessment: Needs assistance Sitting-balance support: Feet supported Sitting balance-Leahy Scale: Good     Standing balance support: Reliant on assistive device for balance Standing balance-Leahy Scale: Poor  Cognition Arousal/Alertness: Awake/alert Behavior During Therapy: WFL for tasks assessed/performed Overall  Cognitive Status: History of cognitive impairments - at baseline Area of Impairment: Memory, Problem solving                     Memory: Decreased short-term memory       Problem Solving: Slow processing, Requires verbal cues General Comments: Hx of STM deficits,        Exercises Amputee Exercises Quad Sets: AROM, Left, 10 reps Hip Extension: AROM, Left, 5 reps, Standing Hip ABduction/ADduction: AROM, Left, 10 reps Straight Leg Raises: AROM, Left, 10 reps    General Comments General comments (skin integrity, edema, etc.): Continued education on tactile facilitation/desensitization for phantom limb pain management      Pertinent Vitals/Pain Pain Assessment Pain Assessment: Faces Faces Pain Scale: Hurts little more Pain Location: phantom pain Pain Descriptors / Indicators: Operative site guarding, Grimacing Pain Intervention(s): Monitored during session, Premedicated before session, Repositioned    Home Living                          Prior Function            PT Goals (current goals can now be found in the care plan section) Acute Rehab PT Goals Patient Stated Goal: less phantom pain PT Goal Formulation: With patient Time For Goal Achievement: 09/13/21    Frequency    Min 3X/week      PT Plan Current plan remains appropriate    Co-evaluation              AM-PAC PT "6 Clicks" Mobility   Outcome Measure  Help needed turning from your back to your side while in a flat bed without using bedrails?: A Little Help needed moving from lying on your back to sitting on the side of a flat bed without using bedrails?: A Little Help needed moving to and from a bed to a chair (including a wheelchair)?: A Lot Help needed standing up from a chair using your arms (e.g., wheelchair or bedside chair)?: A Little Help needed to walk in hospital room?: A Little Help needed climbing 3-5 steps with a railing? : Total 6 Click Score: 15    End of  Session Equipment Utilized During Treatment: Gait belt;Other (comment) (limb protector) Activity Tolerance: Patient tolerated treatment well Patient left: in chair;with call bell/phone within reach;with chair alarm set Nurse Communication: Mobility status PT Visit Diagnosis: Unsteadiness on feet (R26.81);Other abnormalities of gait and mobility (R26.89);Muscle weakness (generalized) (M62.81);Pain Pain - Right/Left: Left Pain - part of body:  (residual limb)     Time: 2706-2376 PT Time Calculation (min) (ACUTE ONLY): 26 min  Charges:  $Gait Training: 8-22 mins $Therapeutic Exercise: 8-22 mins                    Basel Defalco R. PTA Acute Rehabilitation Services Office: [place] 09/01/2021, 10:59 AM

## 2021-09-01 NOTE — Progress Notes (Signed)
Inpatient Rehab Admissions Coordinator:    I have insurance approval and a bed available for pt to admit to CIR today. Dr. Louanne Belton in agreement.  Will let pt/family and TOC team know.   Shann Medal, PT, DPT Admissions Coordinator [phone] 09/01/21  10:33 AM

## 2021-09-01 NOTE — H&P (Signed)
Physical Medicine and Rehabilitation Admission H&P        Chief Complaint  Patient presents with   Functional deficits due to BKA      HPI: Randall Coleman is a 71 year old male with history of DVT, T2DM, HTN,  DVT, A fib- on Eliquis, cognitive decline (in the past year per family reports), progressive ulceration with drainage from left third toe with osteomyelitis with plans for 3-5th toe amputation. He was admitted on 08/25/21 with increase in pain, swelling, redness, foul odorous drainage and worsening of cognition. He was noted to be septic, was in mild DKA and in A fib with RVR w/ HR 130-170. He was started on Vanc/cefepime/flagyl, IVF and started on Cardizem as well as heparin drips. Dr. Doylene Canard consulted for management of A fib.    Once medically optimized, patient underwent L-BKA 07/01 by Dr. Sharol Given. Post op has some bleeding from wound as well issues with poorly controlled BS requiring additional insulin as well as hypomagnesemia in setting of severe malnutrition. He continues to be limited by weakness, pain, cognitive impairments as well as fatigue and anxiety. Therapy has been working with patient and CIR recommended due to functional decline.    Lives alone with concerns of safety. Has family in [place] but plans are to discharge to sister in [place].      Review of Systems  Constitutional:  Negative for chills and fever.  HENT:  Negative for hearing loss.   Respiratory:  Negative for shortness of breath.   Cardiovascular:  Negative for chest pain and palpitations.  Gastrointestinal:  Negative for constipation, heartburn and nausea.  Genitourinary:  Negative for dysuria and urgency.  Skin:  Negative for rash.  Neurological:  Positive for weakness. Negative for dizziness and headaches.  Psychiatric/Behavioral:  The patient does not have insomnia.           Past Medical History:  Diagnosis Date   Atrial fibrillation ([place])     Diabetes mellitus without complication ([place])      DVT (deep venous thrombosis) ([place])     Hypertension             Past Surgical History:  Procedure Laterality Date   AMPUTATION Left 07/30/2020    Procedure: AMPUTATION LEFT GREAT TOE;  Surgeon: [name], MD;  Location: [facility];  Service: Orthopedics;  Laterality: Left;   AMPUTATION Left 04/08/2021    Procedure: AMPUTATION LEFT 2ND TOE;  Surgeon: [name], MD;  Location: [facility];  Service: Orthopedics;  Laterality: Left;   AMPUTATION Left 08/26/2021    Procedure: Left Partial Foot Amputation;  Surgeon: [name], MD;  Location: [facility];  Service: Orthopedics;  Laterality: Left;   AMPUTATION Left 08/29/2021    Procedure: LEFT BELOW KNEE AMPUTATION;  Surgeon: [name], MD;  Location: [facility];  Service: Orthopedics;  Laterality: Left;   HERNIA REPAIR       HERNIA REPAIR       IR CHEST FLUORO   12/13/2018           Family History  Problem Relation Age of Onset   Healthy Sister     Healthy Brother        Social History: Lives alone and has been using a walker for the past few months. He  reports that he has been smoking cigarettes--1 PPD. He has a 54.00 pack-year smoking history. He has never used smokeless tobacco. He reports that he does not currently  use alcohol. He reports that he does not use drugs.     Allergies: No Known Allergies           Medications Prior to Admission  Medication Sig Dispense Refill   acetaminophen (TYLENOL) 500 MG tablet Take 2 tablets (1,000 mg total) by mouth every 8 (eight) hours. (Patient taking differently: Take 1,000 mg by mouth every 6 (six) hours as needed for moderate pain.) 30 tablet 0   apixaban (ELIQUIS) 5 MG TABS tablet Take 5 mg by mouth 2 (two) times daily.       bisacodyl (DULCOLAX) 5 MG EC tablet Take 1 tablet (5 mg total) by mouth daily as needed for moderate constipation. 30 tablet 0   metFORMIN (GLUCOPHAGE) 1000 MG tablet Take 1,000 mg by mouth 2 (two) times daily after a meal.       Multiple Vitamin (MULTIVITAMIN WITH  MINERALS) TABS tablet Take 1 tablet by mouth every morning.       docusate sodium (COLACE) 100 MG capsule Take 1 capsule (100 mg total) by mouth daily. (Patient not taking: Reported on 08/10/2021) 10 capsule 0   polyethylene glycol powder (GLYCOLAX/MIRALAX) 17 GM/SCOOP powder Take 17 g by mouth daily as needed for mild constipation. (Patient not taking: Reported on 08/10/2021) 238 g 0          Home: Home Living Family/patient expects to be discharged to:: Private residence Living Arrangements: Alone Available Help at Discharge: Family Type of Home: Apartment Home Access: Level entry Home Layout: One level Bathroom Shower/Tub: Tub/shower unit (with a walk-in cut out) Bathroom Toilet: Standard Bathroom Accessibility: Yes Home Equipment: Conservation officer, nature (2 wheels) Additional Comments: Plans to move to [place] to live near sister   Functional History: Prior Function Prior Level of Function : Independent/Modified Independent, Driving Mobility Comments: does not use AD ADLs Comments: Continues to drive. Pt independent with ADL's and IADL's as well as medications and finances.   Functional Status:  Mobility: Bed Mobility Overal bed mobility: Needs Assistance Bed Mobility: Supine to Sit Supine to sit: Min guard Sit to supine: Min guard General bed mobility comments: Close min guard for safety, exiting towards left side of bed Transfers Overall transfer level: Needs assistance Equipment used: Rolling walker (2 wheels) Transfers: Sit to/from Stand Sit to Stand: From elevated surface, Min assist, Min guard Bed to/from chair/wheelchair/BSC transfer type:: Squat pivot Stand pivot transfers: Mod assist Squat pivot transfers: Max assist Step pivot transfers: Min assist, +2 physical assistance, +2 safety/equipment General transfer comment: min assist to steady on rise progressing to min guard, x3 from EOB with cues for hand placement each time, x1 from low recliner with min guard for safety  with good power up Ambulation/Gait Ambulation/Gait assistance: Mod assist Gait Distance (Feet): 7 Feet Assistive device: Rolling walker (2 wheels) Gait Pattern/deviations:  (hop-to on RLE) General Gait Details: Mod assist for RW control in forward direction Adequte foot clearance.  Slight assist with balance, VC throughout. Mod assist with turns and educated on backwards steps with assistive device. Somewhat anxious throughout but performed well. Gait velocity: slow Gait velocity interpretation: <1.31 ft/sec, indicative of household ambulator Pre-gait activities: Standing pre-gait activities with weight shift, hoping forward and back x5 at EOB   ADL: ADL Overall ADL's : Needs assistance/impaired Eating/Feeding: Independent, Sitting Grooming: Set up, Sitting Upper Body Bathing: Set up, Sitting Lower Body Bathing: Maximal assistance, Sitting/lateral leans Upper Body Dressing : Set up, Sitting Lower Body Dressing: Maximal assistance, Sit to/from stand Toilet Transfer: Maximal assistance, Stand-pivot,  BSC/3in1 Toilet Transfer Details (indicate cue type and reason): recommend lateral scoot to drop arm Toileting- Clothing Manipulation and Hygiene: Maximal assistance, Sit to/from stand Functional mobility during ADLs: Maximal assistance General ADL Comments: limited by pain and poor command following. squat pivot transfer from chair>bed with 1x sit<>stand   Cognition: Cognition Overall Cognitive Status: History of cognitive impairments - at baseline Orientation Level: Oriented X4 Cognition Arousal/Alertness: Awake/alert Behavior During Therapy: WFL for tasks assessed/performed Overall Cognitive Status: History of cognitive impairments - at baseline Area of Impairment: Memory, Problem solving Current Attention Level: Selective Memory: Decreased short-term memory Following Commands: Follows one step commands with increased time Safety/Judgement: Decreased awareness of safety Problem  Solving: Slow processing, Requires verbal cues General Comments: Hx of STM deficits,     Blood pressure 130/73, pulse 77, temperature 98.2 F (36.8 C), temperature source Oral, resp. rate 17, height '6\' 2"'$  (1.88 m), weight 83.9 kg, SpO2 97 %. Physical Exam Vitals and nursing note reviewed.  Constitutional:      General: He is not in acute distress.    Appearance: He is cachectic.  HENT:     Head: Normocephalic and atraumatic.     Right Ear: External ear normal.     Left Ear: External ear normal.     Mouth/Throat:     Mouth: Mucous membranes are moist.     Pharynx: Oropharynx is clear.  Eyes:     Extraocular Movements: Extraocular movements intact.     Pupils: Pupils are equal, round, and reactive to light.     Comments: Pin point pupils  Cardiovascular:     Rate and Rhythm: Normal rate and regular rhythm.     Heart sounds: No murmur heard. Pulmonary:     Effort: Pulmonary effort is normal. No respiratory distress.     Breath sounds: Normal breath sounds. No wheezing.  Abdominal:     General: Bowel sounds are normal. There is no distension.     Palpations: Abdomen is soft.  Musculoskeletal:     Comments: Left BK stump bulbous.tender  Skin:    General: Skin is warm and dry.     Comments: Vac dressing in place. Onychomycosis of right toenails.   Neurological:     Mental Status: He is alert and oriented to person, place, and time.     Cranial Nerves: No cranial nerve deficit.     Comments: Oriented to self and situation--place- [facility] --needed cues for [facility]. Month "June" had to look at phone to figure that it was July 4th. CN exam intact. UE motor 5/5. RLE 4/5. LLE 3/5 HF, KE/KF. Decreased LT distal RLE below ankles  Psychiatric:        Mood and Affect: Mood normal.        Behavior: Behavior normal.        Lab Results Last 48 Hours        Results for orders placed or performed during the hospital encounter of 08/25/21 (from the past 48 hour(s))  Heparin level  (unfractionated)     Status: Abnormal    Collection Time: 08/30/21  3:20 PM  Result Value Ref Range    Heparin Unfractionated 0.26 (L) 0.30 - 0.70 IU/mL      Comment: (NOTE) The clinical reportable range upper limit is being lowered to >1.10 to align with the FDA approved guidance for the current laboratory assay.   If heparin results are below expected values, and patient dosage has  been confirmed, suggest follow up testing of antithrombin III  levels. Performed at [facility], [place] [address], [place], [place] [address]    Glucose, capillary     Status: Abnormal    Collection Time: 08/30/21  4:32 PM  Result Value Ref Range    Glucose-Capillary 226 (H) 70 - 99 mg/dL      Comment: Glucose reference range applies only to samples taken after fasting for at least 8 hours.    Comment 1 Notify RN    Glucose, capillary     Status: Abnormal    Collection Time: 08/30/21  8:35 PM  Result Value Ref Range    Glucose-Capillary 233 (H) 70 - 99 mg/dL      Comment: Glucose reference range applies only to samples taken after fasting for at least 8 hours.  Heparin level (unfractionated)     Status: None    Collection Time: 08/31/21 12:47 AM  Result Value Ref Range    Heparin Unfractionated 0.35 0.30 - 0.70 IU/mL      Comment: (NOTE) The clinical reportable range upper limit is being lowered to >1.10 to align with the FDA approved guidance for the current laboratory assay.   If heparin results are below expected values, and patient dosage has  been confirmed, suggest follow up testing of antithrombin III levels. Performed at [facility], [place] [address] [place], [place] [address]    CBC     Status: Abnormal    Collection Time: 08/31/21 12:47 AM  Result Value Ref Range    WBC 7.4 4.0 - 10.5 K/uL    RBC 3.77 (L) 4.22 - 5.81 MIL/uL    Hemoglobin 10.5 (L) 13.0 - 17.0 g/dL    HCT 31.5 (L) 39.0 - 52.0 %    MCV 83.6 80.0 - 100.0 fL    MCH 27.9 26.0 - 34.0 pg    MCHC 33.3 30.0 -  36.0 g/dL    RDW 12.6 11.5 - 15.5 %    Platelets 243 150 - 400 K/uL    nRBC 0.0 0.0 - 0.2 %      Comment: Performed at [facility], [place] [address], [place], [place] [address]  Glucose, capillary     Status: Abnormal    Collection Time: 08/31/21  8:20 AM  Result Value Ref Range    Glucose-Capillary 267 (H) 70 - 99 mg/dL      Comment: Glucose reference range applies only to samples taken after fasting for at least 8 hours.  Glucose, capillary     Status: Abnormal    Collection Time: 08/31/21 11:39 AM  Result Value Ref Range    Glucose-Capillary 340 (H) 70 - 99 mg/dL      Comment: Glucose reference range applies only to samples taken after fasting for at least 8 hours.  Glucose, capillary     Status: Abnormal    Collection Time: 08/31/21  4:23 PM  Result Value Ref Range    Glucose-Capillary 211 (H) 70 - 99 mg/dL      Comment: Glucose reference range applies only to samples taken after fasting for at least 8 hours.  Glucose, capillary     Status: Abnormal    Collection Time: 08/31/21  9:00 PM  Result Value Ref Range    Glucose-Capillary 162 (H) 70 - 99 mg/dL      Comment: Glucose reference range applies only to samples taken after fasting for at least 8 hours.  CBC     Status: Abnormal    Collection Time: 09/01/21  2:31  AM  Result Value Ref Range    WBC 7.4 4.0 - 10.5 K/uL    RBC 3.76 (L) 4.22 - 5.81 MIL/uL    Hemoglobin 10.6 (L) 13.0 - 17.0 g/dL    HCT 32.1 (L) 39.0 - 52.0 %    MCV 85.4 80.0 - 100.0 fL    MCH 28.2 26.0 - 34.0 pg    MCHC 33.0 30.0 - 36.0 g/dL    RDW 13.1 11.5 - 15.5 %    Platelets 279 150 - 400 K/uL    nRBC 0.0 0.0 - 0.2 %      Comment: Performed at [facility], [place] [address], [place], [place] [address]  Basic metabolic panel     Status: Abnormal    Collection Time: 09/01/21  2:31 AM  Result Value Ref Range    Sodium 133 (L) 135 - 145 mmol/L    Potassium 4.5 3.5 - 5.1 mmol/L    Chloride 101 98 - 111 mmol/L    CO2 27 22 - 32 mmol/L    Glucose,  Bld 268 (H) 70 - 99 mg/dL      Comment: Glucose reference range applies only to samples taken after fasting for at least 8 hours.    BUN 16 8 - 23 mg/dL    Creatinine, Ser 0.66 0.61 - 1.24 mg/dL    Calcium 8.2 (L) 8.9 - 10.3 mg/dL    GFR, Estimated >60 >60 mL/min      Comment: (NOTE) Calculated using the CKD-EPI Creatinine Equation (2021)      Anion gap 5 5 - 15      Comment: Performed at [facility] [address] [place], [place] [address]  Magnesium     Status: None    Collection Time: 09/01/21  2:31 AM  Result Value Ref Range    Magnesium 1.8 1.7 - 2.4 mg/dL      Comment: Performed at [facility] [address], [place], [place] [address]  Glucose, capillary     Status: Abnormal    Collection Time: 09/01/21  7:54 AM  Result Value Ref Range    Glucose-Capillary 318 (H) 70 - 99 mg/dL      Comment: Glucose reference range applies only to samples taken after fasting for at least 8 hours.  Glucose, capillary     Status: Abnormal    Collection Time: 09/01/21 11:35 AM  Result Value Ref Range    Glucose-Capillary 158 (H) 70 - 99 mg/dL      Comment: Glucose reference range applies only to samples taken after fasting for at least 8 hours.      Imaging Results (Last 48 hours)  No results found.         Blood pressure 130/73, pulse 77, temperature 98.2 F (36.8 C), temperature source Oral, resp. rate 17, height '6\' 2"'$  (1.88 m), weight 83.9 kg, SpO2 97 %.   Medical Problem List and Plan: 1. 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  Antithrombotics: -DVT/anticoagulation:  Pharmaceutical: Other (comment)--Eliquis             -antiplatelet therapy:  N/A 3. Pain Management: Oxycodone prn             -add gabapentin  $'100mg'p$  TID for phantom limb pain             -discussed massage and desensitization 4. Mood/Sleep:  LCSW to follow for evaluation and support.              -antipsychotic agents: N/A 5. Neuropsych/cognition: This patient is capable of making decisions on his own behalf. 6. Skin/Wound Care: Continue wound VAC for 7 days post-op 7. Fluids/Electrolytes/Nutrition: Has been on IVF since admission-->d/c.  --Check CMET in am             --encourage intake. Will change glucerna to TID with meals to avoid BS spikes             --continue Juven, Vitamin C and Zinc to promote wound healing.  8. A fib: Monitor HR TID--currently controlled on Cardizem 120 mg BID and metoprolol 50 mg bid             --on Eliquis w/ H/H stable 9. T2DM: Hgb A1c-7.9-- 10.7 in 4 months likely due to infection/now controlled.   -- Mild DKA-->beta hydroxybutyric acid 3.55-->0.91 --will continue to monitor BS ac/hs and titrate meds as indicated.              --Was on Metformin 1000 mg bid at home             --continue Levemir (increased to 15 u BID 07/04) w/ 4 units novolog  ac TID. Titrate as indicated. 10. Depressed systolic LVF: Started on Sacubitril-valsartan bid on 07/02. -Monitor renal function              -daily weights 11. ABLA/anemia of chronic illness: Monitor H/H with serial checks. --check CBC in am.  12. Hypomagnesemia: Improved post IV supplement and on oral supplement -likely due to malnutrition. Will recheck in am.   13. Chronic hyponatremia: Improving. Will recheck Na level in am.        Bary Leriche, PA-C 09/01/2021   I have personally performed a face to face diagnostic evaluation of this patient and formulated the key components of the plan.  Additionally, I have personally reviewed laboratory data, imaging studies, as well as relevant notes and concur with the physician assistant's documentation above.  The patient's status has not changed from the original H&P.  Any changes in documentation from the acute care chart have been noted above.  Meredith Staggers, MD, [facility]

## 2021-09-01 NOTE — Progress Notes (Signed)
Patient ID: Randall Coleman, male   DOB: 20-Feb-1951, 71 y.o.   MRN: [id number] Patient is status post left transtibial amputation.  No drainage in the wound VAC canister patient is comfortable.  Anticipate discharge to inpatient rehab.

## 2021-09-01 NOTE — Plan of Care (Signed)
  Problem: Education: Goal: Knowledge of disease or condition will improve Outcome: Progressing Goal: Understanding of medication regimen will improve Outcome: Progressing Goal: Individualized Educational Video(s) Outcome: Progressing   Problem: Activity: Goal: Ability to tolerate increased activity will improve Outcome: Progressing   Problem: Cardiac: Goal: Ability to achieve and maintain adequate cardiopulmonary perfusion will improve Outcome: Progressing   Problem: Health Behavior/Discharge Planning: Goal: Ability to safely manage health-related needs after discharge will improve Outcome: Progressing   Problem: Education: Goal: Knowledge of General Education information will improve Description: Including pain rating scale, medication(s)/side effects and non-pharmacologic comfort measures Outcome: Progressing   Problem: Health Behavior/Discharge Planning: Goal: Ability to manage health-related needs will improve Outcome: Progressing   Problem: Clinical Measurements: Goal: Ability to maintain clinical measurements within normal limits will improve Outcome: Progressing Goal: Will remain free from infection Outcome: Progressing Goal: Diagnostic test results will improve Outcome: Progressing Goal: Respiratory complications will improve Outcome: Progressing Goal: Cardiovascular complication will be avoided Outcome: Progressing   Problem: Activity: Goal: Risk for activity intolerance will decrease Outcome: Progressing   Problem: Nutrition: Goal: Adequate nutrition will be maintained Outcome: Progressing   Problem: Coping: Goal: Level of anxiety will decrease Outcome: Progressing   Problem: Elimination: Goal: Will not experience complications related to bowel motility Outcome: Progressing Goal: Will not experience complications related to urinary retention Outcome: Progressing   Problem: Pain Managment: Goal: General experience of comfort will improve Outcome:  Progressing   Problem: Safety: Goal: Ability to remain free from injury will improve Outcome: Progressing   Problem: Skin Integrity: Goal: Risk for impaired skin integrity will decrease Outcome: Progressing   Problem: Education: Goal: Ability to describe self-care measures that may prevent or decrease complications (Diabetes Survival Skills Education) will improve Outcome: Progressing Goal: Individualized Educational Video(s) Outcome: Progressing   Problem: Coping: Goal: Ability to adjust to condition or change in health will improve Outcome: Progressing   Problem: Fluid Volume: Goal: Ability to maintain a balanced intake and output will improve Outcome: Progressing   Problem: Health Behavior/Discharge Planning: Goal: Ability to identify and utilize available resources and services will improve Outcome: Progressing Goal: Ability to manage health-related needs will improve Outcome: Progressing   Problem: Metabolic: Goal: Ability to maintain appropriate glucose levels will improve Outcome: Progressing   Problem: Nutritional: Goal: Maintenance of adequate nutrition will improve Outcome: Progressing Goal: Progress toward achieving an optimal weight will improve Outcome: Progressing   Problem: Skin Integrity: Goal: Risk for impaired skin integrity will decrease Outcome: Progressing   Problem: Tissue Perfusion: Goal: Adequacy of tissue perfusion will improve Outcome: Progressing   Problem: Education: Goal: Knowledge of the prescribed therapeutic regimen will improve Outcome: Progressing Goal: Ability to verbalize activity precautions or restrictions will improve Outcome: Progressing Goal: Understanding of discharge needs will improve Outcome: Progressing   Problem: Activity: Goal: Ability to perform//tolerate increased activity and mobilize with assistive devices will improve Outcome: Progressing   Problem: Clinical Measurements: Goal: Postoperative  complications will be avoided or minimized Outcome: Progressing   Problem: Self-Care: Goal: Ability to meet self-care needs will improve Outcome: Progressing   Problem: Self-Concept: Goal: Ability to maintain and perform role responsibilities to the fullest extent possible will improve Outcome: Progressing   Problem: Pain Management: Goal: Pain level will decrease with appropriate interventions Outcome: Progressing   Problem: Skin Integrity: Goal: Demonstration of wound healing without infection will improve Outcome: Progressing

## 2021-09-01 NOTE — Plan of Care (Signed)
  Problem: Education: Goal: Knowledge of disease or condition will improve Outcome: Progressing   

## 2021-09-01 NOTE — H&P (Signed)
Physical Medicine and Rehabilitation Admission H&P    Chief Complaint  Patient presents with   Functional deficits due to BKA    HPI: Randall Coleman is a 71 year old male with history of DVT, T2DM, HTN,  DVT, A fib- on Eliquis, cognitive decline (in the past year per family reports), progressive ulceration with drainage from left third toe with osteomyelitis with plans for 3-5th toe amputation. He was admitted on 08/25/21 with increase in pain, swelling, redness, foul odorous drainage and worsening of cognition. He was noted to be septic, was in mild DKA and in A fib with RVR w/ HR 130-170. He was started on Vanc/cefepime/flagyl, IVF and started on Cardizem as well as heparin drips. Dr. Doylene Canard consulted for management of A fib.   Once medically optimized, patient underwent L-BKA 07/01 by Dr. Sharol Given. Post op has some bleeding from wound as well issues with poorly controlled BS requiring additional insulin as well as hypomagnesemia in setting of severe malnutrition. He continues to be limited by weakness, pain, cognitive impairments as well as fatigue and anxiety. Therapy has been working with patient and CIR recommended due to functional decline.   Lives alone with concerns of safety. Has family in [place] but plans are to discharge to sister in [place].    Review of Systems  Constitutional:  Negative for chills and fever.  HENT:  Negative for hearing loss.   Respiratory:  Negative for shortness of breath.   Cardiovascular:  Negative for chest pain and palpitations.  Gastrointestinal:  Negative for constipation, heartburn and nausea.  Genitourinary:  Negative for dysuria and urgency.  Skin:  Negative for rash.  Neurological:  Positive for weakness. Negative for dizziness and headaches.  Psychiatric/Behavioral:  The patient does not have insomnia.     Past Medical History:  Diagnosis Date   Atrial fibrillation ([place])    Diabetes mellitus without complication ([place])    DVT (deep venous  thrombosis) ([place])    Hypertension     Past Surgical History:  Procedure Laterality Date   AMPUTATION Left 07/30/2020   Procedure: AMPUTATION LEFT GREAT TOE;  Surgeon: [name], MD;  Location: [facility];  Service: Orthopedics;  Laterality: Left;   AMPUTATION Left 04/08/2021   Procedure: AMPUTATION LEFT 2ND TOE;  Surgeon: [name], MD;  Location: [facility];  Service: Orthopedics;  Laterality: Left;   AMPUTATION Left 08/26/2021   Procedure: Left Partial Foot Amputation;  Surgeon: [name], MD;  Location: [facility];  Service: Orthopedics;  Laterality: Left;   AMPUTATION Left 08/29/2021   Procedure: LEFT BELOW KNEE AMPUTATION;  Surgeon: [name], MD;  Location: [facility];  Service: Orthopedics;  Laterality: Left;   HERNIA REPAIR     HERNIA REPAIR     IR CHEST FLUORO  12/13/2018    Family History  Problem Relation Age of Onset   Healthy Sister    Healthy Brother     Social History: Lives alone and has been using a walker for the past few months. He  reports that he has been smoking cigarettes--1 PPD. He has a 54.00 pack-year smoking history. He has never used smokeless tobacco. He reports that he does not currently use alcohol. He reports that he does not use drugs.   Allergies: No Known Allergies   Medications Prior to Admission  Medication Sig Dispense Refill   acetaminophen (TYLENOL) 500 MG tablet Take 2 tablets (1,000 mg total) by mouth every 8 (eight) hours. (Patient taking differently:  Take 1,000 mg by mouth every 6 (six) hours as needed for moderate pain.) 30 tablet 0   apixaban (ELIQUIS) 5 MG TABS tablet Take 5 mg by mouth 2 (two) times daily.     bisacodyl (DULCOLAX) 5 MG EC tablet Take 1 tablet (5 mg total) by mouth daily as needed for moderate constipation. 30 tablet 0   metFORMIN (GLUCOPHAGE) 1000 MG tablet Take 1,000 mg by mouth 2 (two) times daily after a meal.     Multiple Vitamin (MULTIVITAMIN WITH MINERALS) TABS tablet Take 1 tablet by mouth every morning.      docusate sodium (COLACE) 100 MG capsule Take 1 capsule (100 mg total) by mouth daily. (Patient not taking: Reported on 08/10/2021) 10 capsule 0   polyethylene glycol powder (GLYCOLAX/MIRALAX) 17 GM/SCOOP powder Take 17 g by mouth daily as needed for mild constipation. (Patient not taking: Reported on 08/10/2021) 238 g 0      Home: Home Living Family/patient expects to be discharged to:: Private residence Living Arrangements: Alone Available Help at Discharge: Family Type of Home: Apartment Home Access: Level entry Home Layout: One level Bathroom Shower/Tub: Tub/shower unit (with a walk-in cut out) Bathroom Toilet: Standard Bathroom Accessibility: Yes Home Equipment: Conservation officer, nature (2 wheels) Additional Comments: Plans to move to [place] to live near sister   Functional History: Prior Function Prior Level of Function : Independent/Modified Independent, Driving Mobility Comments: does not use AD ADLs Comments: Continues to drive. Pt independent with ADL's and IADL's as well as medications and finances.  Functional Status:  Mobility: Bed Mobility Overal bed mobility: Needs Assistance Bed Mobility: Supine to Sit Supine to sit: Min guard Sit to supine: Min guard General bed mobility comments: Close min guard for safety, exiting towards left side of bed Transfers Overall transfer level: Needs assistance Equipment used: Rolling walker (2 wheels) Transfers: Sit to/from Stand Sit to Stand: From elevated surface, Min assist, Min guard Bed to/from chair/wheelchair/BSC transfer type:: Squat pivot Stand pivot transfers: Mod assist Squat pivot transfers: Max assist Step pivot transfers: Min assist, +2 physical assistance, +2 safety/equipment General transfer comment: min assist to steady on rise progressing to min guard, x3 from EOB with cues for hand placement each time, x1 from low recliner with min guard for safety with good power up Ambulation/Gait Ambulation/Gait assistance: Mod  assist Gait Distance (Feet): 7 Feet Assistive device: Rolling walker (2 wheels) Gait Pattern/deviations:  (hop-to on RLE) General Gait Details: Mod assist for RW control in forward direction Adequte foot clearance.  Slight assist with balance, VC throughout. Mod assist with turns and educated on backwards steps with assistive device. Somewhat anxious throughout but performed well. Gait velocity: slow Gait velocity interpretation: <1.31 ft/sec, indicative of household ambulator Pre-gait activities: Standing pre-gait activities with weight shift, hoping forward and back x5 at EOB    ADL: ADL Overall ADL's : Needs assistance/impaired Eating/Feeding: Independent, Sitting Grooming: Set up, Sitting Upper Body Bathing: Set up, Sitting Lower Body Bathing: Maximal assistance, Sitting/lateral leans Upper Body Dressing : Set up, Sitting Lower Body Dressing: Maximal assistance, Sit to/from stand Toilet Transfer: Maximal assistance, Stand-pivot, BSC/3in1 Toilet Transfer Details (indicate cue type and reason): recommend lateral scoot to drop arm Toileting- Clothing Manipulation and Hygiene: Maximal assistance, Sit to/from stand Functional mobility during ADLs: Maximal assistance General ADL Comments: limited by pain and poor command following. squat pivot transfer from chair>bed with 1x sit<>stand  Cognition: Cognition Overall Cognitive Status: History of cognitive impairments - at baseline Orientation Level: Oriented X4 Cognition Arousal/Alertness: Awake/alert  Behavior During Therapy: WFL for tasks assessed/performed Overall Cognitive Status: History of cognitive impairments - at baseline Area of Impairment: Memory, Problem solving Current Attention Level: Selective Memory: Decreased short-term memory Following Commands: Follows one step commands with increased time Safety/Judgement: Decreased awareness of safety Problem Solving: Slow processing, Requires verbal cues General Comments: Hx of  STM deficits,   Blood pressure 130/73, pulse 77, temperature 98.2 F (36.8 C), temperature source Oral, resp. rate 17, height '6\' 2"'$  (1.88 m), weight 83.9 kg, SpO2 97 %. Physical Exam Vitals and nursing note reviewed.  Constitutional:      General: He is not in acute distress.    Appearance: He is cachectic.  HENT:     Head: Normocephalic and atraumatic.     Right Ear: External ear normal.     Left Ear: External ear normal.     Mouth/Throat:     Mouth: Mucous membranes are moist.     Pharynx: Oropharynx is clear.  Eyes:     Extraocular Movements: Extraocular movements intact.     Pupils: Pupils are equal, round, and reactive to light.     Comments: Pin point pupils  Cardiovascular:     Rate and Rhythm: Normal rate and regular rhythm.     Heart sounds: No murmur heard. Pulmonary:     Effort: Pulmonary effort is normal. No respiratory distress.     Breath sounds: Normal breath sounds. No wheezing.  Abdominal:     General: Bowel sounds are normal. There is no distension.     Palpations: Abdomen is soft.  Musculoskeletal:     Comments: Left BK stump bulbous.tender  Skin:    General: Skin is warm and dry.     Comments: Vac dressing in place. Onychomycosis of right toenails.   Neurological:     Mental Status: He is alert and oriented to person, place, and time.     Cranial Nerves: No cranial nerve deficit.     Comments: Oriented to self and situation--place- [facility] --needed cues for [facility]. Month "June" had to look at phone to figure that it was July 4th. CN exam intact. UE motor 5/5. RLE 4/5. LLE 3/5 HF, KE/KF. Decreased LT distal RLE below ankles  Psychiatric:        Mood and Affect: Mood normal.        Behavior: Behavior normal.     Results for orders placed or performed during the hospital encounter of 08/25/21 (from the past 48 hour(s))  Heparin level (unfractionated)     Status: Abnormal   Collection Time: 08/30/21  3:20 PM  Result Value Ref Range   Heparin  Unfractionated 0.26 (L) 0.30 - 0.70 IU/mL    Comment: (NOTE) The clinical reportable range upper limit is being lowered to >1.10 to align with the FDA approved guidance for the current laboratory assay.  If heparin results are below expected values, and patient dosage has  been confirmed, suggest follow up testing of antithrombin III levels. Performed at [facility], [place] [address], [place], [place] [address]   Glucose, capillary     Status: Abnormal   Collection Time: 08/30/21  4:32 PM  Result Value Ref Range   Glucose-Capillary 226 (H) 70 - 99 mg/dL    Comment: Glucose reference range applies only to samples taken after fasting for at least 8 hours.   Comment 1 Notify RN   Glucose, capillary     Status: Abnormal   Collection Time: 08/30/21  8:35 PM  Result Value  Ref Range   Glucose-Capillary 233 (H) 70 - 99 mg/dL    Comment: Glucose reference range applies only to samples taken after fasting for at least 8 hours.  Heparin level (unfractionated)     Status: None   Collection Time: 08/31/21 12:47 AM  Result Value Ref Range   Heparin Unfractionated 0.35 0.30 - 0.70 IU/mL    Comment: (NOTE) The clinical reportable range upper limit is being lowered to >1.10 to align with the FDA approved guidance for the current laboratory assay.  If heparin results are below expected values, and patient dosage has  been confirmed, suggest follow up testing of antithrombin III levels. Performed at [facility], [place] [address], [place], [place] [address]   CBC     Status: Abnormal   Collection Time: 08/31/21 12:47 AM  Result Value Ref Range   WBC 7.4 4.0 - 10.5 K/uL   RBC 3.77 (L) 4.22 - 5.81 MIL/uL   Hemoglobin 10.5 (L) 13.0 - 17.0 g/dL   HCT 31.5 (L) 39.0 - 52.0 %   MCV 83.6 80.0 - 100.0 fL   MCH 27.9 26.0 - 34.0 pg   MCHC 33.3 30.0 - 36.0 g/dL   RDW 12.6 11.5 - 15.5 %   Platelets 243 150 - 400 K/uL   nRBC 0.0 0.0 - 0.2 %    Comment: Performed at [facility],  [place] [address] [place], [place] [address]  Glucose, capillary     Status: Abnormal   Collection Time: 08/31/21  8:20 AM  Result Value Ref Range   Glucose-Capillary 267 (H) 70 - 99 mg/dL    Comment: Glucose reference range applies only to samples taken after fasting for at least 8 hours.  Glucose, capillary     Status: Abnormal   Collection Time: 08/31/21 11:39 AM  Result Value Ref Range   Glucose-Capillary 340 (H) 70 - 99 mg/dL    Comment: Glucose reference range applies only to samples taken after fasting for at least 8 hours.  Glucose, capillary     Status: Abnormal   Collection Time: 08/31/21  4:23 PM  Result Value Ref Range   Glucose-Capillary 211 (H) 70 - 99 mg/dL    Comment: Glucose reference range applies only to samples taken after fasting for at least 8 hours.  Glucose, capillary     Status: Abnormal   Collection Time: 08/31/21  9:00 PM  Result Value Ref Range   Glucose-Capillary 162 (H) 70 - 99 mg/dL    Comment: Glucose reference range applies only to samples taken after fasting for at least 8 hours.  CBC     Status: Abnormal   Collection Time: 09/01/21  2:31 AM  Result Value Ref Range   WBC 7.4 4.0 - 10.5 K/uL   RBC 3.76 (L) 4.22 - 5.81 MIL/uL   Hemoglobin 10.6 (L) 13.0 - 17.0 g/dL   HCT 32.1 (L) 39.0 - 52.0 %   MCV 85.4 80.0 - 100.0 fL   MCH 28.2 26.0 - 34.0 pg   MCHC 33.0 30.0 - 36.0 g/dL   RDW 13.1 11.5 - 15.5 %   Platelets 279 150 - 400 K/uL   nRBC 0.0 0.0 - 0.2 %    Comment: Performed at [facility], [place] [address] [place], [place] [address]  Basic metabolic panel     Status: Abnormal   Collection Time: 09/01/21  2:31 AM  Result Value Ref Range   Sodium 133 (L) 135 - 145 mmol/L  Potassium 4.5 3.5 - 5.1 mmol/L   Chloride 101 98 - 111 mmol/L   CO2 27 22 - 32 mmol/L   Glucose, Bld 268 (H) 70 - 99 mg/dL    Comment: Glucose reference range applies only to samples taken after fasting for at least 8 hours.   BUN 16 8 - 23 mg/dL   Creatinine, Ser 0.66 0.61 -  1.24 mg/dL   Calcium 8.2 (L) 8.9 - 10.3 mg/dL   GFR, Estimated >60 >60 mL/min    Comment: (NOTE) Calculated using the CKD-EPI Creatinine Equation (2021)    Anion gap 5 5 - 15    Comment: Performed at [facility] [address] , [place] [address]  Magnesium     Status: None   Collection Time: 09/01/21  2:31 AM  Result Value Ref Range   Magnesium 1.8 1.7 - 2.4 mg/dL    Comment: Performed at [facility] [address] [place], [place] [address]  Glucose, capillary     Status: Abnormal   Collection Time: 09/01/21  7:54 AM  Result Value Ref Range   Glucose-Capillary 318 (H) 70 - 99 mg/dL    Comment: Glucose reference range applies only to samples taken after fasting for at least 8 hours.  Glucose, capillary     Status: Abnormal   Collection Time: 09/01/21 11:35 AM  Result Value Ref Range   Glucose-Capillary 158 (H) 70 - 99 mg/dL    Comment: Glucose reference range applies only to samples taken after fasting for at least 8 hours.   No results found.    Blood pressure 130/73, pulse 77, temperature 98.2 F (36.8 C), temperature source Oral, resp. rate 17, height '6\' 2"'$  (1.88 m), weight 83.9 kg, SpO2 97 %.  Medical Problem List and Plan: 1. Functional deficits secondary to PAD necessitating a left BKA 08/29/21  -patient may not yet shower  -ELOS/Goals: 10-14 days, mod I goals 2.  Antithrombotics: -DVT/anticoagulation:  Pharmaceutical: Other (comment)--Eliquis  -antiplatelet therapy:  N/A 3. Pain Management: Oxycodone prn  -add gabapentin '100mg'$  TID for phantom limb pain  -discussed massage and desensitization 4. Mood/Sleep: LCSW to follow for evaluation and support.   -antipsychotic agents: N/A 5. Neuropsych/cognition: This patient is capable of making decisions on his own behalf. 6. Skin/Wound Care: Continue wound VAC for 7 days post-op 7. Fluids/Electrolytes/Nutrition: Has been on IVF since admission-->d/c.  --Check CMET in am  --encourage intake. Will  change glucerna to TID with meals to avoid BS spikes  --continue Juven, Vitamin C and Zinc to promote wound healing.  8. A fib: Monitor HR TID--currently controlled on Cardizem 120 mg BID and metoprolol 50 mg bid  --on Eliquis w/ H/H stable 9. T2DM: Hgb A1c-7.9-- 10.7 in 4 months likely due to infection/now controlled.   -- Mild DKA-->beta hydroxybutyric acid 3.55-->0.91 --will continue to monitor BS ac/hs and titrate meds as indicated.   --Was on Metformin 1000 mg bid at home  --continue Levemir (increased to 15 u BID 07/04) w/ 4 units novolog  ac TID. Titrate as indicated. 10. Depressed systolic LVF: Started on Sacubitril-valsartan bid on 07/02. -Monitor renal function   -daily weights 11. ABLA/anemia of chronic illness: Monitor H/H with serial checks. --check CBC in am.  12. Hypomagnesemia: Improved post IV supplement and on oral supplement -likely due to malnutrition. Will recheck in am.   13. Chronic hyponatremia: Improving. Will recheck Na level in am.     Bary Leriche, PA-C 09/01/2021

## 2021-09-02 DIAGNOSIS — S88112A Complete traumatic amputation at level between knee and ankle, left lower leg, initial encounter: Secondary | ICD-10-CM

## 2021-09-02 LAB — CBC WITH DIFFERENTIAL/PLATELET
Abs Immature Granulocytes: 0.24 10*3/uL — ABNORMAL HIGH (ref 0.00–0.07)
Basophils Absolute: 0.1 10*3/uL (ref 0.0–0.1)
Basophils Relative: 1 %
Eosinophils Absolute: 0.2 10*3/uL (ref 0.0–0.5)
Eosinophils Relative: 3 %
HCT: 34.5 % — ABNORMAL LOW (ref 39.0–52.0)
Hemoglobin: 11.6 g/dL — ABNORMAL LOW (ref 13.0–17.0)
Immature Granulocytes: 3 %
Lymphocytes Relative: 18 %
Lymphs Abs: 1.3 10*3/uL (ref 0.7–4.0)
MCH: 28 pg (ref 26.0–34.0)
MCHC: 33.6 g/dL (ref 30.0–36.0)
MCV: 83.1 fL (ref 80.0–100.0)
Monocytes Absolute: 0.6 10*3/uL (ref 0.1–1.0)
Monocytes Relative: 7 %
Neutro Abs: 5.2 10*3/uL (ref 1.7–7.7)
Neutrophils Relative %: 68 %
Platelets: 309 10*3/uL (ref 150–400)
RBC: 4.15 MIL/uL — ABNORMAL LOW (ref 4.22–5.81)
RDW: 13 % (ref 11.5–15.5)
WBC: 7.5 10*3/uL (ref 4.0–10.5)
nRBC: 0 % (ref 0.0–0.2)

## 2021-09-02 LAB — COMPREHENSIVE METABOLIC PANEL
ALT: 14 U/L (ref 0–44)
AST: 13 U/L — ABNORMAL LOW (ref 15–41)
Albumin: 2.4 g/dL — ABNORMAL LOW (ref 3.5–5.0)
Alkaline Phosphatase: 106 U/L (ref 38–126)
Anion gap: 7 (ref 5–15)
BUN: 10 mg/dL (ref 8–23)
CO2: 27 mmol/L (ref 22–32)
Calcium: 8.5 mg/dL — ABNORMAL LOW (ref 8.9–10.3)
Chloride: 100 mmol/L (ref 98–111)
Creatinine, Ser: 0.66 mg/dL (ref 0.61–1.24)
GFR, Estimated: >60 mL/min (ref >60)
Glucose, Bld: 206 mg/dL — ABNORMAL HIGH (ref 70–99)
Potassium: 4.2 mmol/L (ref 3.5–5.1)
Sodium: 134 mmol/L — ABNORMAL LOW (ref 135–145)
Total Bilirubin: 0.6 mg/dL (ref 0.3–1.2)
Total Protein: 5.7 g/dL — ABNORMAL LOW (ref 6.5–8.1)

## 2021-09-02 LAB — GLUCOSE, CAPILLARY
Glucose-Capillary: 204 mg/dL — ABNORMAL HIGH (ref 70–99)
Glucose-Capillary: 322 mg/dL — ABNORMAL HIGH (ref 70–99)
Glucose-Capillary: 288 mg/dL — ABNORMAL HIGH (ref 70–99)
Glucose-Capillary: 178 mg/dL — ABNORMAL HIGH (ref 70–99)

## 2021-09-02 LAB — MAGNESIUM: Magnesium: 1.9 mg/dL (ref 1.7–2.4)

## 2021-09-02 MED ORDER — METFORMIN HCL 500 MG PO TABS
500.0000 mg | ORAL_TABLET | Freq: Two times a day (BID) | ORAL | Status: DC
  Administered 2021-09-02 – 2021-09-03 (×3): 500 mg via ORAL
  Filled 2021-09-02 (×3): qty 1

## 2021-09-02 MED ORDER — GABAPENTIN 300 MG PO CAPS
300.0000 mg | ORAL_CAPSULE | Freq: Three times a day (TID) | ORAL | Status: DC
  Administered 2021-09-02 – 2021-09-03 (×5): 300 mg via ORAL
  Filled 2021-09-02 (×5): qty 1

## 2021-09-02 NOTE — Evaluation (Signed)
Physical Therapy Assessment and Plan  Patient Details  Name: Randall Coleman MRN: [id number] Date of Birth: [birth year]  PT Diagnosis: Abnormal posture, Abnormality of gait, Difficulty walking, Impaired cognition, Impaired sensation, Muscle weakness, and Pain in L residual limb Rehab Potential: Good ELOS: 12-14 days   Today's Date: 09/02/2021 PT Individual Time: 1100-1130 PT Individual Time Calculation (min): 30 min PT Missed Time: 30 min Missed Time Reason: pain, fatigue, toileting, pt unwillingness to participate  Hospital Problem: Principal Problem:   Below-knee amputation of left lower extremity ([place]) Active Problems:   S/P BKA (below knee amputation), left [facility])   Past Medical History:  Past Medical History:  Diagnosis Date   Atrial fibrillation ([place])    Diabetes mellitus without complication ([place])    DVT (deep venous thrombosis) ([place])    Hypertension    Past Surgical History:  Past Surgical History:  Procedure Laterality Date   AMPUTATION Left 07/30/2020   Procedure: AMPUTATION LEFT GREAT TOE;  Surgeon: [name], MD;  Location: [facility];  Service: Orthopedics;  Laterality: Left;   AMPUTATION Left 04/08/2021   Procedure: AMPUTATION LEFT 2ND TOE;  Surgeon: [name], MD;  Location: [facility];  Service: Orthopedics;  Laterality: Left;   AMPUTATION Left 08/26/2021   Procedure: Left Partial Foot Amputation;  Surgeon: [name], MD;  Location: [facility];  Service: Orthopedics;  Laterality: Left;   AMPUTATION Left 08/29/2021   Procedure: LEFT BELOW KNEE AMPUTATION;  Surgeon: [name], MD;  Location: [facility];  Service: Orthopedics;  Laterality: Left;   HERNIA REPAIR     HERNIA REPAIR     IR CHEST FLUORO  12/13/2018    Assessment & Plan Clinical Impression:  Randall Coleman is a 71 year old male with history of DVT, T2DM, HTN,  DVT, A fib- on Eliquis, cognitive decline (in the past year per family reports), progressive ulceration with drainage from left third toe with  osteomyelitis with plans for 3-5th toe amputation. He was admitted on 08/25/21 with increase in pain, swelling, redness, foul odorous drainage and worsening of cognition. He was noted to be septic, was in mild DKA and in A fib with RVR w/ HR 130-170. He was started on Vanc/cefepime/flagyl, IVF and started on Cardizem as well as heparin drips. Dr. Doylene Canard consulted for management of A fib.    Once medically optimized, patient underwent L-BKA 07/01 by Dr. Sharol Given. Post op has some bleeding from wound as well issues with poorly controlled BS requiring additional insulin as well as hypomagnesemia in setting of severe malnutrition. He continues to be limited by weakness, pain, cognitive impairments as well as fatigue and anxiety. Therapy has been working with patient and CIR recommended due to functional decline. Patient transferred to CIR on 09/01/2021 .   Patient currently requires mod with mobility secondary to muscle weakness, decreased cardiorespiratoy endurance, decreased attention, decreased awareness, decreased problem solving, decreased safety awareness, and decreased memory, and decreased sitting balance, decreased standing balance, decreased postural control, decreased balance strategies, and difficulty maintaining precautions.  Prior to hospitalization, patient was independent  with mobility and lived with Alone in a [place] home.  Home access is  Level entry.  Patient will benefit from skilled PT intervention to maximize safe functional mobility, minimize fall risk, and decrease caregiver burden for planned discharge home with 24 hour supervision.  Anticipate patient will benefit from follow up [place] at discharge.  PT - End of Session Activity Tolerance: Tolerates 30+ min activity with multiple rests Endurance Deficit:  Yes Endurance Deficit Description: frequent rest breaks during functional activity PT Assessment Rehab Potential (ACUTE/IP ONLY): Good PT Barriers to Discharge: Decreased caregiver  support;Lack of/limited family support;Weight bearing restrictions PT Patient demonstrates impairments in the following area(s): Balance;Endurance;Pain;Safety;Sensory;Skin Integrity PT Transfers Functional Problem(s): Bed Mobility;Bed to Chair;Car;Furniture;Floor PT Locomotion Functional Problem(s): Ambulation;Wheelchair Mobility;Stairs PT Plan PT Intensity: Minimum of 1-2 x/day ,45 to 90 minutes PT Frequency: 5 out of 7 days PT Duration Estimated Length of Stay: 12-14 days PT Treatment/Interventions: Ambulation/gait training;Balance/vestibular training;Cognitive remediation/compensation;Community reintegration;Discharge planning;DME/adaptive equipment instruction;Functional mobility training;Neuromuscular re-education;Pain management;Patient/family education;Psychosocial support;Stair training;Therapeutic Activities;Therapeutic Exercise;UE/LE Strength taining/ROM;UE/LE Coordination activities;Wheelchair propulsion/positioning PT Transfers Anticipated Outcome(s): Supervision PT Locomotion Anticipated Outcome(s): Supervision at w/c level PT Recommendation Recommendations for Other Services: Speech consult;Neuropsych consult;Therapeutic Recreation consult Therapeutic Recreation Interventions: Stress management Follow Up Recommendations: Home health PT;24 hour supervision/assistance Patient destination: Home Equipment Recommended: Rolling walker with 5" wheels;Wheelchair (measurements);Wheelchair cushion (measurements) Equipment Details: 16x16 w/c with L amputee limb support   PT Evaluation Precautions/Restrictions Precautions Precautions: Fall Precaution Comments: wound vac Required Braces or Orthoses: Other Brace Other Brace: L limb protector Restrictions Weight Bearing Restrictions: Yes LLE Weight Bearing: Non weight bearing Pain Interference Pain Interference Pain Effect on Sleep: 3. Frequently Pain Interference with Therapy Activities: 4. Almost constantly Pain Interference with  Day-to-Day Activities: 4. Almost constantly Home Living/Prior Functioning Home Living Available Help at Discharge: Other (Comment) (unsure, conflicting reports in chart and pt unsure of d/c plan) Type of Home: Apartment Home Access: Level entry Home Layout: One level Bathroom Shower/Tub: Tub/shower unit (with a walk-in cut out) Bathroom Toilet: Standard Bathroom Accessibility: Yes Additional Comments: Plans to move to [place] to live near sister  Lives With: Alone Prior Function Level of Independence: Independent with gait;Independent with transfers  Able to Take Stairs?: Yes Driving: Yes Vocation: Retired Radiographer, therapeutic - History Baseline Vision: Wears glasses all the time Ability to See in Adequate Light: 0 Adequate Perception Perception: Within Functional Limits Praxis Praxis: Intact  Cognition Overall Cognitive Status: History of cognitive impairments - at baseline Arousal/Alertness: Awake/alert Orientation Level: Oriented to person;Oriented to place;Oriented to situation;Disoriented to time Year: 2024 Month: July Attention: Focused;Sustained Focused Attention: Appears intact Sustained Attention: Impaired Memory: Impaired Memory Impairment: Decreased recall of new information Awareness: Impaired Awareness Impairment: Emergent impairment Problem Solving: Impaired Behaviors: Restless;Impulsive Safety/Judgment: Impaired Sensation Sensation Light Touch: Impaired Detail Light Touch Impaired Details: Impaired RLE;Impaired LLE (impaired distally>proximally; diabetic neuropathy at baseline) Proprioception: Appears Intact Additional Comments: Sensation not formally tested, however, pt reports no sensation deficits. Coordination Gross Motor Movements are Fluid and Coordinated: No Fine Motor Movements are Fluid and Coordinated: Yes Coordination and Movement Description: impaired 2/2 global weakness and L BKA Motor  Motor Motor: Abnormal postural alignment and  control Motor - Skilled Clinical Observations: impaired 2/2 global weakness and L BKA  Trunk/Postural Assessment  Cervical Assessment Cervical Assessment: Exceptions to [facility] (forward head) Thoracic Assessment Thoracic Assessment: Exceptions to [facility] (kyphotic) Lumbar Assessment Lumbar Assessment: Exceptions to [facility] (posterior pelvic tilt) Postural Control Postural Control: Deficits on evaluation Righting Reactions: delayed/insufficient  Balance Balance Balance Assessed: Yes Static Sitting Balance Static Sitting - Balance Support: No upper extremity supported;Feet supported Static Sitting - Level of Assistance: 5: Stand by assistance Dynamic Sitting Balance Dynamic Sitting - Balance Support: No upper extremity supported;Feet supported;During functional activity Dynamic Sitting - Level of Assistance: 4: Min assist Static Standing Balance Static Standing - Balance Support: Bilateral upper extremity supported;During functional activity Static Standing - Level of Assistance: 3: Mod assist Dynamic Standing  Balance Dynamic Standing - Balance Support: Bilateral upper extremity supported;During functional activity Dynamic Standing - Level of Assistance: 3: Mod assist Extremity Assessment  RUE Assessment RUE Assessment: Within Functional Limits LUE Assessment LUE Assessment: Within Functional Limits RLE Assessment RLE Assessment: Exceptions to [facility] General Strength Comments: impaired, see below RLE Strength Right Hip Flexion: 3/5 Right Knee Flexion: 3/5 Right Knee Extension: 3/5 Right Ankle Dorsiflexion: 3/5 LLE Assessment LLE Assessment: Exceptions to [facility] Passive Range of Motion (PROM) Comments: L BKA General Strength Comments: not tested 2/2 L BKA  Care Tool Care Tool Bed Mobility Roll left and right activity   Roll left and right assist level: Supervision/Verbal cueing    Sit to lying activity   Sit to lying assist level: Supervision/Verbal cueing    Lying to sitting on side  of bed activity   Lying to sitting on side of bed assist level: the ability to move from lying on the back to sitting on the side of the bed with no back support.: Supervision/Verbal cueing     Care Tool Transfers Sit to stand transfer   Sit to stand assist level: Moderate Assistance - Patient 50 - 74%    Chair/bed transfer   Chair/bed transfer assist level: Moderate Assistance - Patient 50 - 74%     Toilet transfer   Assist Level: Moderate Assistance - Patient 50 - 74%    Car transfer Car transfer activity did not occur: Safety/medical concerns        Care Tool Locomotion Ambulation Ambulation activity did not occur: Safety/medical concerns        Walk 10 feet activity Walk 10 feet activity did not occur: Safety/medical concerns       Walk 50 feet with 2 turns activity Walk 50 feet with 2 turns activity did not occur: Safety/medical concerns      Walk 150 feet activity Walk 150 feet activity did not occur: Safety/medical concerns      Walk 10 feet on uneven surfaces activity Walk 10 feet on uneven surfaces activity did not occur: Safety/medical concerns      Stairs Stair activity did not occur: Safety/medical concerns        Walk up/down 1 step activity Walk up/down 1 step or curb (drop down) activity did not occur: Safety/medical concerns      Walk up/down 4 steps activity Walk up/down 4 steps activity did not occur: Safety/medical concerns      Walk up/down 12 steps activity Walk up/down 12 steps activity did not occur: Safety/medical concerns      Pick up small objects from floor Pick up small object from the floor (from standing position) activity did not occur: Safety/medical concerns      Wheelchair Is the patient using a wheelchair?: Yes Type of Wheelchair: Manual Wheelchair activity did not occur: Refused Wheelchair assist level: Supervision/Verbal cueing Max wheelchair distance: 150'  Wheel 50 feet with 2 turns activity Wheelchair 50 feet with 2  turns activity did not occur: Refused Assist Level: Supervision/Verbal cueing  Wheel 150 feet activity Wheelchair 150 feet activity did not occur: Refused Assist Level: Supervision/Verbal cueing    Refer to Care Plan for Long Term Goals  SHORT TERM GOAL WEEK 1 PT Short Term Goal 1 (Week 1): Pt will perform least restrictive transfer with min A consistently PT Short Term Goal 2 (Week 1): Pt will maintain standing balance x 5 min while completing functional task with min A PT Short Term Goal 3 (Week 1): Pt will initiate gait  training  Recommendations for other services: Other: Speech Therapy  Skilled Therapeutic Intervention Evaluation completed (see details above and below) with education on PT POC and goals and individual treatment initiated with focus on functional transfer assessment, orientation to rehab unit and schedule, and setting pt up with appropriate equipment to be utilized during rehab stay. Pt received sidelying in bed reporting urgent need to toilet. Bed mobility Supervision. Sit to stand and stand pivot transfer bed to [facility] with RW and mod A for balance. Pt very unsteady on his feet with frequent LOB in multiple directions, needs mod A to prevent fall. Pt exhibits impulsivity and decreased safety awareness during transfer. Pt requires assist for clothing management before sitting on BSC. Pt reports feeing constipated, nursing notified and able to administer suppository. This therapist gave pt 15 min to finish toileting, upon therapist return pt able to successfully void, dependent for pericare and clothing management assist from nursing. Pt received back in bed. Obtained 16x16 w/c to assess fit for pt, pt declines any more transfers OOB this AM due to ongoing pain. Pt reports pain in his residual limb at surgical site as well as phantom limb pain. Reviewed techniques for management of phantom pain and discussed pain medication schedule for management of surgical pain. Pt reports he  initially was trying to not take pain medication but has a better understanding now of how it can help manage his symptoms so he can participate more in therapy. Pt left supine in bed with needs in reach, bed alarm in place. Pt missed 30 min total of scheduled therapy session due to toileting, pain, and declining to further participate due to fatigue and pain.  Mobility Bed Mobility Bed Mobility: Rolling Right;Rolling Left;Supine to Sit;Sit to Supine Rolling Right: Supervision/verbal cueing Rolling Left: Supervision/Verbal cueing Supine to Sit: Supervision/Verbal cueing Sit to Supine: Supervision/Verbal cueing Transfers Transfers: Sit to Stand;Stand Pivot Transfers Sit to Stand: Moderate Assistance - Patient 50-74% Stand Pivot Transfers: Moderate Assistance - Patient 50 - 74% Stand Pivot Transfer Details: Verbal cues for sequencing;Verbal cues for technique;Verbal cues for precautions/safety;Verbal cues for safe use of DME/AE Transfer (Assistive device): Rolling walker Locomotion  Stairs / Additional Locomotion Stairs: No Wheelchair Mobility Wheelchair Mobility: Yes Wheelchair Assistance: Chartered loss adjuster: Both upper extremities Wheelchair Parts Management: Needs assistance Distance: 150   Discharge Criteria: Patient will be discharged from PT if patient refuses treatment 3 consecutive times without medical reason, if treatment goals not met, if there is a change in medical status, if patient makes no progress towards goals or if patient is discharged from hospital.  The above assessment, treatment plan, treatment alternatives and goals were discussed and mutually agreed upon: by patient   Excell Seltzer, PT, DPT, CSRS 09/02/2021, 4:39 PM

## 2021-09-02 NOTE — Evaluation (Signed)
Occupational Therapy Assessment and Plan  Patient Details  Name: Randall Coleman MRN: [id number] Date of Birth: [birth year]  OT Diagnosis: muscle weakness (generalized) Rehab Potential: Good ELOS: 14 days   Today's Date: 09/02/2021 OT Individual Time: 0900-1000 OT Individual Time Calculation (min): 60 min     Hospital Problem: Principal Problem:   Below-knee amputation of left lower extremity ([place]) Active Problems:   S/P BKA (below knee amputation), left ([place])   Past Medical History:  Past Medical History:  Diagnosis Date   Atrial fibrillation ([place])    Diabetes mellitus without complication ([place])    DVT (deep venous thrombosis) ([place])    Hypertension    Past Surgical History:  Past Surgical History:  Procedure Laterality Date   AMPUTATION Left 07/30/2020   Procedure: AMPUTATION LEFT GREAT TOE;  Surgeon: [name], MD;  Location: [facility];  Service: Orthopedics;  Laterality: Left;   AMPUTATION Left 04/08/2021   Procedure: AMPUTATION LEFT 2ND TOE;  Surgeon: [name], MD;  Location: [facility];  Service: Orthopedics;  Laterality: Left;   AMPUTATION Left 08/26/2021   Procedure: Left Partial Foot Amputation;  Surgeon: [name], MD;  Location: [facility];  Service: Orthopedics;  Laterality: Left;   AMPUTATION Left 08/29/2021   Procedure: LEFT BELOW KNEE AMPUTATION;  Surgeon: [name], MD;  Location: [place];  Service: Orthopedics;  Laterality: Left;   HERNIA REPAIR     HERNIA REPAIR     IR CHEST FLUORO  12/13/2018    Assessment & Plan Clinical Impression: Patient is a 71 y.o. year old male with history of DVT, T2DM, HTN,  DVT, A fib- on Eliquis, cognitive decline (in the past year per family reports), progressive ulceration with drainage from left third toe with osteomyelitis with plans for 3-5th toe amputation. He was admitted on 08/25/21 with increase in pain, swelling, redness, foul odorous drainage and worsening of cognition. He was noted to be septic, was in mild DKA and in A  fib with RVR w/ HR 130-170. He was started on Vanc/cefepime/flagyl, IVF and started on Cardizem as well as heparin drips. Dr. Doylene Canard consulted for management of A fib.    Once medically optimized, patient underwent L-BKA 07/01 by Dr. Sharol Given. Post op has some bleeding from wound as well issues with poorly controlled BS requiring additional insulin as well as hypomagnesemia in setting of severe malnutrition. He continues to be limited by weakness, pain, cognitive impairments as well as fatigue and anxiety. Therapy has been working with patient and CIR recommended due to functional decline. Lives alone with concerns of safety. Has family in [place] but plans are to discharge to sister in [place].   Patient transferred to CIR on 09/01/2021 .    Patient currently requires moderate - maximal assistance with basic self-care skills and moderate assistance for functional mobility secondary to muscle weakness and decreased problem solving, decreased safety awareness, and decreased memory.  Prior to hospitalization, patient could complete ADL's and functional mobility with independently.  Patient will benefit from skilled intervention to decrease level of assist with basic self-care skills and increase independence with basic self-care skills prior to discharge home with care partner.  Anticipate patient will require 24 hour supervision and potentially follow up home health to assist with home set-up   OT - End of Session Activity Tolerance: Tolerates 30+ min activity with multiple rests Endurance Deficit: Yes OT Assessment Rehab Potential (ACUTE ONLY): Good OT Barriers to Discharge: Decreased caregiver support OT Barriers to Discharge Comments:  Pt lives alone and currently requiring moderate assistance for mobility. OT Patient demonstrates impairments in the following area(s): Balance;Safety;Cognition;Endurance;Motor OT Basic ADL's Functional Problem(s): Grooming;Bathing;Dressing;Toileting OT Advanced ADL's  Functional Problem(s): Simple Meal Preparation;Laundry;Light Housekeeping OT Transfers Functional Problem(s): Toilet;Tub/Shower OT Additional Impairment(s): None OT Plan OT Intensity: Minimum of 1-2 x/day, 45 to 90 minutes OT Frequency: 5 out of 7 days OT Duration/Estimated Length of Stay: 14 days OT Treatment/Interventions: Discharge planning;Therapeutic Activities;Self Care/advanced ADL retraining;Patient/family education;Functional mobility training;Skin care/wound managment;Therapeutic Exercise;Community reintegration;DME/adaptive equipment instruction;Psychosocial support;UE/LE Strength taining/ROM;Wheelchair propulsion/positioning OT Self Feeding Anticipated Outcome(s): set-up assist OT Basic Self-Care Anticipated Outcome(s): supervision OT Toileting Anticipated Outcome(s): supervision OT Bathroom Transfers Anticipated Outcome(s): supervision OT Recommendation Recommendations for Other Services: Speech consult Patient destination: Home (pending progress) Follow Up Recommendations: Home health OT (for home evaluation and set-up) Equipment Recommended: Tub/shower bench;3 in 1 bedside comode;To be determined   OT Evaluation Precautions/Restrictions  Precautions Precautions: Fall Precaution Comments: small wound vac Required Braces or Orthoses:  (L limb protector) Other Brace: L limb protector Restrictions Weight Bearing Restrictions: Yes LLE Weight Bearing: Non weight bearing (LLE) General Chart Reviewed: Yes PT Missed Treatment Reason: Patient fatigue;Pain;Patient unwilling to participate;Toileting Family/Caregiver Present: No Vital Signs (most recent vitals charted from nursing): Therapy Vitals Temp: 98.3 F (36.8 C) Pulse Rate: 80 Resp: 15 BP: 102/66 Patient Position (if appropriate): Lying Oxygen Therapy SpO2: 99 % O2 Device: Room AirPlease see "Flowsheet" for most recent vitals taken and charted by nursing staff.  Pain Pain Scale: 0-10 Pain Rating: no pain Home  Living/Prior Functioning Home Living Family/patient expects to be discharged to: Private residence Living Arrangements: Alone Available Help at Discharge:  (Unsure, pt does not provide a clear report. Will need to follow-up.) Type of Home: Apartment Home Access: Level entry Home Layout: One level Bathroom Shower/Tub: Tub/shower unit (with a walk-in cut out) Bathroom Toilet: Standard Bathroom Accessibility: Yes Additional Comments: Plans to move to [place] to live near sister  Lives With: Alone IADL History Homemaking Responsibilities: Yes Current License: Yes Mode of Transportation: Musician Occupation: Retired Prior Function Level of Independence: Independent with basic ADLs, Independent with homemaking with ambulation, Independent with gait  Able to Take Stairs?: Yes Driving: Yes Vocation: Retired Surveyor, mining Baseline Vision/History: 1 Wears glasses (bifocals) Ability to See in Adequate Light: 0 Adequate Patient Visual Report: No change from baseline Vision Assessment?: No apparent visual deficits Perception  Perception: Within Functional Limits Praxis Praxis: Intact Cognition Cognition Overall Cognitive Status: History of cognitive impairments - at baseline Arousal/Alertness: Awake/alert Orientation Level: Person;Situation;Place Person: Oriented Place: Oriented Memory: Impaired Memory Impairment: Decreased recall of new information Decreased Long Term Memory: Verbal basic Decreased Short Term Memory: Verbal basic Attention: Focused;Sustained Focused Attention: Appears intact Sustained Attention: Impaired Awareness: Impaired Awareness Impairment: Emergent impairment Problem Solving: Impaired Behaviors: Restless;Impulsive Safety/Judgment: Impaired Brief Interview for Mental Status (BIMS) Repetition of Three Words (First Attempt): 3 Temporal Orientation: Year: Missed by 1 year Temporal Orientation: Month: Accurate within 5 days Temporal Orientation: Day: Correct Recall:  "Sock": No, could not recall Recall: "Blue": Yes, no cue required Recall: "Bed": No, could not recall BIMS Summary Score: 10 Sensation Sensation Additional Comments: Sensation not formally tested, however, pt reports no sensation deficits. Coordination Gross Motor Movements are Fluid and Coordinated: Yes (in BUE) Fine Motor Movements are Fluid and Coordinated: Yes Coordination and Movement Description: generalized weakness noted. Motor  Motor Motor: Within Functional Limits Motor - Skilled Clinical Observations: generalized weakness  Trunk/Postural Assessment  Cervical Assessment Cervical Assessment: Within Functional Limits Thoracic Assessment  Thoracic Assessment: Within Functional Limits Lumbar Assessment Lumbar Assessment: Within Functional Limits Postural Control Postural Control: Deficits on evaluation Righting Reactions: delayed  Balance Balance Balance Assessed: Yes (Not formally assessed. Only assessed with ADLs and transfers.) Static Sitting Balance Static Sitting - Balance Support: No upper extremity supported;Feet unsupported Static Sitting - Level of Assistance: 5: Stand by assistance Dynamic Sitting Balance Dynamic Sitting - Balance Support: Feet supported;No upper extremity supported;During functional activity Dynamic Sitting - Level of Assistance: 4: Min Insurance risk surveyor Standing - Balance Support: Bilateral upper extremity supported;During functional activity Static Standing - Level of Assistance: 3: Mod assist Dynamic Standing Balance Dynamic Standing - Balance Support: Bilateral upper extremity supported;During functional activity Dynamic Standing - Level of Assistance: 3: Mod assist Extremity/Trunk Assessment RUE Assessment RUE Assessment: Within Functional Limits   Care Tool Care Tool Self Care Eating   Eating Assist Level: Set up assist    Oral Care    Oral Care Assist Level: Set up assist    Bathing   Body parts bathed  by patient: Right arm;Left arm;Chest;Abdomen;Front perineal area;Right upper leg;Left upper leg;Face Body parts bathed by helper: Right lower leg Body parts n/a: Left lower leg Assist Level: Minimal Assistance - Patient > 75%    Upper Body Dressing(including orthotics)   What is the patient wearing?: Pull over shirt   Assist Level: Supervision/Verbal cueing    Lower Body Dressing (excluding footwear)   What is the patient wearing?: Pants;Underwear/pull up Assist for lower body dressing: Maximal Assistance - Patient 25 - 49%    Putting on/Taking off footwear     Assist for footwear: Maximal Assistance - Patient 25 - 49%       Care Tool Toileting Toileting activity   Assist for toileting: Maximal Assistance - Patient 25 - 49%     Care Tool Bed Mobility Roll left and right activity   Roll left and right assist level: Supervision/Verbal cueing    Sit to lying activity   Sit to lying assist level: Supervision/Verbal cueing    Lying to sitting on side of bed activity   Lying to sitting on side of bed assist level: the ability to move from lying on the back to sitting on the side of the bed with no back support.: Supervision/Verbal cueing     Care Tool Transfers Sit to stand transfer   Sit to stand assist level: Moderate Assistance - Patient 50 - 74%    Chair/bed transfer   Chair/bed transfer assist level: Moderate Assistance - Patient 50 - 74%     Toilet transfer   Assist Level: Moderate Assistance - Patient 50 - 74%     Care Tool Cognition  Expression of Ideas and Wants Expression of Ideas and Wants: 3. Some difficulty - exhibits some difficulty with expressing needs and ideas (e.g, some words or finishing thoughts) or speech is not clear  Understanding Verbal and Non-Verbal Content Understanding Verbal and Non-Verbal Content: 3. Usually understands - understands most conversations, but misses some part/intent of message. Requires cues at times to understand    Memory/Recall Ability Memory/Recall Ability : That he or she is in a hospital/hospital unit   Refer to Care Plan for Long Term Goals  SHORT TERM GOAL WEEK 1 OT Short Term Goal 1 (Week 1): Pt will complete toileting with minimal assistance. OT Short Term Goal 2 (Week 1): Pt wil complete UB dressing with set-up assistance. OT Short Term Goal 3 (Week 1): Pt will complete LB dressing with  minimal assistance using LRAD and AE as needed. OT Short Term Goal 4 (Week 1): Pt will complete bathing tasks with supervison using AE and DME. OT Short Term Goal 5 (Week 1): Pt will complete toilet transfer with minimal assistance using LRAD.  Recommendations for other services: Other: Speech Therapy    Skilled Therapeutic Intervention Pt awake in bed upon OT arrival to the room. Pt reports, "I don't think I that I have ever been up since being in the hospital." Pt in agreement for OT session. Pt in agreement for evaluation and ADL session to improve safety with ADL and transfers.  ADL Grooming: Setup (Pt able to complete oral care tasks while supine in bed with set-up assist.) Where Assessed-Grooming: Bed level Upper Body Bathing: Supervision/safety (Pt able to bathe all portions of UB with supervision for safety.) Where Assessed-Upper Body Bathing: Edge of bed Lower Body Bathing: Minimal cueing (Pt able to bathe 1/4 LB body parts while seated at EOB. Pt requires assistance to bathe R foot.) Where Assessed-Lower Body Bathing: Edge of bed Upper Body Dressing: Supervision/safety (Pt able to don a pull-over shirt with supervision for safety while seated at EOB.) Where Assessed-Upper Body Dressing: Edge of bed Lower Body Dressing: Maximal assistance (Pt requires assistance to complete all portions of LB dressing. However, pt participates in threading RLE. Pt requires minimal assistance to maintain standing balance while assistance is provided to pull clothing up.) Where Assessed-Lower Body Dressing: Edge of  bed Toileting: Maximal assistance (Pt able to complete 1/3 portion of task on BSC. Pt requires assistance to manage clothing, however, is able to perform toileting hygiene while seated on the [facility].) Where Assessed-Toileting: Bedside Commode Toilet Transfer: Moderate assistance (Pt requires moderate assist for SPT bed EOB <> BSC with use of FWW for safety.) Toilet Transfer Method: Stand pivot Toilet Transfer Equipment: Bedside commode ADL Comments: Pt demonstrates STM deficits with ADLs due to requiring repeated instructions for techniques to perform ADLs. For example, pt requires various repeats of plan to transfer to [facility] for toileting tasks. Pt demonstrates poor safety awareness and decreased stability when standing. Pt goes to attempt a squat-pivot transfer from [facility] > EOB with poo safety awareness. Education provided to pt on need for FWW to improve safety. Mobility  Bed Mobility Bed Mobility: Rolling Right;Rolling Left;Supine to Sit;Sit to Supine Rolling Right: Supervision/verbal cueing Rolling Left: Supervision/Verbal cueing Supine to Sit: Supervision/Verbal cueing Sit to Supine: Supervision/Verbal cueing Transfers Sit to Stand: Moderate Assistance - Patient 50-74%  Education: Education provided to pt on recommendation of using FWW for transfers and not performing a squat-pivot due to needing increased assistance without the [place]. Educated pt on rehab process, pt's role in the rehab process, and the role of OT within rehab. Pt verbalized understanding. Pt did not mention plan to move to [place] to live with family. Pt demonstrates questionable cognitive deficits which can impact pt's information provided during evaluation.   Pt returned to bed at end of session. Pt left resting comfortably in bed with personal belongings and call light within reach, bed alarm on and activated, bed in low position, 3 bed rails up, and comfort needs attended to.   Discharge Criteria: Patient will be discharged from  OT if patient refuses treatment 3 consecutive times without medical reason, if treatment goals not met, if there is a change in medical status, if patient makes no progress towards goals or if patient is discharged from hospital.  The above assessment, treatment plan, treatment alternatives and goals were  discussed and mutually agreed upon: by patient  Barbee Shropshire 09/02/2021, 1:53 PM

## 2021-09-02 NOTE — Progress Notes (Signed)
PROGRESS NOTE   Subjective/Complaints:  Pt reports his main issue is phantom pain- it's Numb and tingling and shooting intermittently in LLE below tip of BKA.   LBM 2 days ago.   ROS:  Pt denies SOB, abd pain, CP, N/V/C/D, and vision changes   Objective:   No results found. Recent Labs    09/01/21 0231 09/02/21 0513  WBC 7.4 7.5  HGB 10.6* 11.6*  HCT 32.1* 34.5*  PLT 279 309   Recent Labs    09/01/21 0231 09/02/21 0513  NA 133* 134*  K 4.5 4.2  CL 101 100  CO2 27 27  GLUCOSE 268* 206*  BUN 16 10  CREATININE 0.66 0.66  CALCIUM 8.2* 8.5*    Intake/Output Summary (Last 24 hours) at 09/02/2021 1318 Last data filed at 09/02/2021 0729 Gross per 24 hour  Intake 697 ml  Output 2000 ml  Net -1303 ml        Physical Exam: Vital Signs Blood pressure (!) 154/94, pulse 77, temperature 97.8 F (36.6 C), resp. rate 18, height '6\' 2"'$  (1.88 m), SpO2 97 %.   General: awake, alert, appropriate, supine in bed; awake; saying he doesn't know if ready to do therapy yet today; NAD HENT: conjugate gaze; oropharynx moist CV: regular rate; no JVD Pulmonary: CTA B/L; no W/R/R- good air movement GI: soft, NT, ND, (+)BS- slightly hypoactive Psychiatric: appropriate- concerned about therapy Neurological: Ox3 Musculoskeletal:     Comments: Left BK stump bulbous.tender -wearing shrinker and limb guard Skin:    General: Skin is warm and dry.     Comments: Vac dressing in place. Onychomycosis of right toenails.   Neurological:     Mental Status: He is alert and oriented to person, place, and time.     Cranial Nerves: No cranial nerve deficit.     Comments: Oriented to self and situation--place- [facility] --needed cues for [facility]. Month "June" had to look at phone to figure that it was July 4th. CN exam intact. UE motor 5/5. RLE 4/5. LLE 3/5 HF, KE/KF. Decreased LT distal RLE below ankles   Assessment/Plan: 1. Functional  deficits which require 3+ hours per day of interdisciplinary therapy in a comprehensive inpatient rehab setting. Physiatrist is providing close team supervision and 24 hour management of active medical problems listed below. Physiatrist and rehab team continue to assess barriers to discharge/monitor patient progress toward functional and medical goals  Care Tool:  Bathing    Body parts bathed by patient: Right arm, Left arm, Chest, Abdomen, Front perineal area, Right upper leg, Left upper leg, Face   Body parts bathed by helper: Right lower leg Body parts n/a: Left lower leg   Bathing assist Assist Level: Minimal Assistance - Patient > 75%     Upper Body Dressing/Undressing Upper body dressing   What is the patient wearing?: Pull over shirt    Upper body assist Assist Level: Supervision/Verbal cueing    Lower Body Dressing/Undressing Lower body dressing      What is the patient wearing?: Pants, Underwear/pull up     Lower body assist Assist for lower body dressing: Maximal Assistance - Patient 25 - 49%     Toileting  Toileting    Toileting assist Assist for toileting: Maximal Assistance - Patient 25 - 49%     Transfers Chair/bed transfer  Transfers assist     Chair/bed transfer assist level: Moderate Assistance - Patient 50 - 74%     Locomotion Ambulation   Ambulation assist   Ambulation activity did not occur: Safety/medical concerns          Walk 10 feet activity   Assist  Walk 10 feet activity did not occur: Safety/medical concerns        Walk 50 feet activity   Assist Walk 50 feet with 2 turns activity did not occur: Safety/medical concerns         Walk 150 feet activity   Assist Walk 150 feet activity did not occur: Safety/medical concerns         Walk 10 feet on uneven surface  activity   Assist Walk 10 feet on uneven surfaces activity did not occur: Safety/medical concerns         Wheelchair     Assist Is the  patient using a wheelchair?: Yes Type of Wheelchair: Manual Wheelchair activity did not occur: Refused         Wheelchair 50 feet with 2 turns activity    Assist    Wheelchair 50 feet with 2 turns activity did not occur: Refused       Wheelchair 150 feet activity     Assist  Wheelchair 150 feet activity did not occur: Refused       Blood pressure (!) 154/94, pulse 77, temperature 97.8 F (36.6 C), resp. rate 18, height '6\' 2"'$  (1.88 m), SpO2 97 %.  Medical Problem List and Plan: 1. Functional deficits secondary to osteomyelitis of LLE ultimately necessitating a left BKA 08/29/21             -patient may not yet shower d/t vac             -ELOS/Goals: 10-14 days, mod I goals  First day of evaluations- con't PT and OT- explained to pt has to do evaluations today but will work on nerve pain 2.  Antithrombotics: -DVT/anticoagulation:  Pharmaceutical: Other (comment)--Eliquis             -antiplatelet therapy:  N/A 3. Pain Management: Oxycodone prn             -add gabapentin '100mg'$  TID for phantom limb pain  7/5- will increase gabapentin to 300 mg TID for phantom pain- and might need Cymbalta as well             -discussed massage and desensitization 4. Mood/Sleep: LCSW to follow for evaluation and support.              -antipsychotic agents: N/A 5. Neuropsych/cognition: This patient is capable of making decisions on his own behalf. 6. Skin/Wound Care: Continue wound VAC for 7 days post-op 7. Fluids/Electrolytes/Nutrition: Has been on IVF since admission-->d/c.  --Check CMET in am             --encourage intake. Will change glucerna to TID with meals to avoid BS spikes             --continue Juven, Vitamin C and Zinc to promote wound healing.  8. A fib: Monitor HR TID--currently controlled on Cardizem 120 mg BID and metoprolol 50 mg bid             --on Eliquis w/ H/H stable 9. T2DM: Hgb A1c-7.9-- 10.7 in 4 months likely due  to infection/now controlled.   -- Mild  DKA-->beta hydroxybutyric acid 3.55-->0.91 --will continue to monitor BS ac/hs and titrate meds as indicated.              --Was on Metformin 1000 mg bid at home             --continue Levemir (increased to 15 u BID 07/04) w/ 4 units novolog  ac TID. Titrate as indicated.  7/5- Cbg's 200-300s- just got increase yesterday- will add metformin 500 mg BID back to regimen.   10. Depressed systolic LVF: Started on Sacubitril-valsartan bid on 07/02. -Monitor renal function              -daily weights 11. ABLA/anemia of chronic illness: Monitor H/H with serial checks. --check CBC in am.  12. Hypomagnesemia: Improved post IV supplement and on oral supplement -likely due to malnutrition. Will recheck in am.   7/5- Mg level is 1.9- will con't to monitor 13. Chronic hyponatremia: Improving. Will recheck Na level in am.    7/5- Na is 134- will monitor for trends.    I spent a total of  35  minutes on total care today- >50% coordination of care- due to d/w team about CBG's as well as phantom pain.    LOS: 1 days A FACE TO FACE EVALUATION WAS PERFORMED  Lonzo Saulter 09/02/2021, 1:18 PM

## 2021-09-02 NOTE — Progress Notes (Signed)
Physical Therapy Session Note  Patient Details  Name: Randall Coleman MRN: [id number] Date of Birth: 1951/01/26  Today's Date: 09/02/2021 PT Individual Time: 1415-1530 PT Individual Time Calculation (min): 75 min   Short Term Goals: Week 1:  PT Short Term Goal 1 (Week 1): Pt will perform least restrictive transfer with min A consistently PT Short Term Goal 2 (Week 1): Pt will maintain standing balance x 5 min while completing functional task with min A PT Short Term Goal 3 (Week 1): Pt will initiate gait training  Skilled Therapeutic Interventions/Progress Updates:    Pt received seated in bed, agreeable with encouragement to participate in PT session. Pt reports ongoing pain in L residual limb, reports being premedicated prior to start of therapy session. Pt continues to complain of phantom limb pain as well. Pt expresses anxiety regarding d/c plan and he is aware that he would not be safe at home alone. Educated pt that therapy recommending 24/7 Supervision upon d/c and the team will work with social work and family to determine best d/c disposition for pt. Pt understanding of education. Bed mobility Supervision with use of bedrail. Sit to stand and stand pivot transfer with mod A and RW, improved control noted as compared to AM session. Pt also asking if he can "scoot" his R foot along the floor. Encouraged pt to "hop" and educated him on preventing skin breakdown on remaining limb and importance of caring for limb especially as he has diabetic neuropathy. Pt understanding of education. Manual w/c propulsion x 150 ft with use of BUE at Supervision level with cues for propulsion technique. Stand pivot transfer w/c to/from mat table with mod A and RW. Sit to/from supine on mat table at Supervision level. Reviewed use of mirror therapy for phantom limb pain management. Pt able to perform heel slides, hip abd, ankle pumps, and SAQ with RLE and use of mirror while in semi-long-sitting position against  therapy wedge. Pt then able to perform LLE SLR, hip abd, and quad sets x 10 reps each. Reviewed how to don/doff residual limb guard and purpose of wearing it. Also discussed importance of positioning for residual limb to prevent contracture. Pt returned to bed at end of session, needs in reach, bed alarm in place.  Therapy Documentation Precautions:  Precautions Precautions: Fall Precaution Comments: small wound vac Required Braces or Orthoses:  (L limb protector) Other Brace: L limb protector Restrictions Weight Bearing Restrictions: Yes LLE Weight Bearing: Non weight bearing (LLE)    Therapy/Group: Individual Therapy   Excell Seltzer, PT, DPT, CSRS 09/02/2021, 4:23 PM

## 2021-09-02 NOTE — Progress Notes (Signed)
Inpatient Rehabilitation Care Coordinator Assessment and Plan Patient Details  Name: Randall Coleman MRN: [id number] Date of Birth: March 17, 1950  Today's Date: 09/02/2021  Hospital Problems: Principal Problem:   Below-knee amputation of left lower extremity ([place]) Active Problems:   S/P BKA (below knee amputation), left [facility])  Past Medical History:  Past Medical History:  Diagnosis Date   Atrial fibrillation ([place])    Diabetes mellitus without complication ([place])    DVT (deep venous thrombosis) ([place])    Hypertension    Past Surgical History:  Past Surgical History:  Procedure Laterality Date   AMPUTATION Left 07/30/2020   Procedure: AMPUTATION LEFT GREAT TOE;  Surgeon: [name], MD;  Location: [place];  Service: Orthopedics;  Laterality: Left;   AMPUTATION Left 04/08/2021   Procedure: AMPUTATION LEFT 2ND TOE;  Surgeon: [name], MD;  Location: [place];  Service: Orthopedics;  Laterality: Left;   AMPUTATION Left 08/26/2021   Procedure: Left Partial Foot Amputation;  Surgeon: [name], MD;  Location: [place];  Service: Orthopedics;  Laterality: Left;   AMPUTATION Left 08/29/2021   Procedure: LEFT BELOW KNEE AMPUTATION;  Surgeon: [name], MD;  Location: [place];  Service: Orthopedics;  Laterality: Left;   HERNIA REPAIR     HERNIA REPAIR     IR CHEST FLUORO  12/13/2018   Social History:  reports that he has been smoking cigarettes. He has a 54.00 pack-year smoking history. He has never used smokeless tobacco. He reports that he does not currently use alcohol. He reports that he does not use drugs.  Family / Support Systems Marital Status: Single Patient Roles: Other (Comment) (sibling) Other Supports: Cheryl-sister [phone] Anticipated Caregiver: Malachy Mood Ability/Limitations of Caregiver: Malachy Mood lives in [place] and pt is unsure if he plans to go to her home at [employer] Caregiver Availability: Other (Comment) (Depends upon what pt decides) Family Dynamics: Pt has family in [place] and  sister in [place] he is close too. He is alone here and relies upon himsefl, but has not ben taking care of himself.  Social History Preferred language: English Religion:  Cultural Background: No issues Education: [place] - How often do you need to have someone help you when you read instructions, pamphlets, or other written material from your doctor or pharmacy?: Never Writes: Yes Employment Status: Retired Public relations account executive Issues: No issues Guardian/Conservator: None-according to MD pt is capable of making his own decisions while here   Abuse/Neglect Abuse/Neglect Assessment Can Be Completed: Yes Physical Abuse: Denies Verbal Abuse: Denies Sexual Abuse: Denies Exploitation of patient/patient's resources: Denies Self-Neglect: Denies  Patient response to: Social Isolation - How often do you feel lonely or isolated from those around you?: Never  Emotional Status Pt's affect, behavior and adjustment status: Pt has always taken care of himself but has been having memory issues and it seems he is not doing as well. He can not name the town where his sister lives in [place]. Recent Psychosocial Issues: other health issues Psychiatric History: no history seems to be struggling with health issues and being on his own with no supports. May benefit from seeing neuro-psych while here Substance Abuse History: Tobacco aware of the risks and is not sure if he will quit  Patient / Family Perceptions, Expectations & Goals Pt/Family understanding of illness & functional limitations: Pt can explain his leg has been amputated and he needs to learn to move without it. He does talk with the MD and feels he knows what will  happen here. Premorbid pt/family roles/activities: brother, cousin, neighbor Anticipated changes in roles/activities/participation: resume Pt/family expectations/goals: Pt states: " I have not decided if I am going to my sister's, I want to see how I do  here."  [employer]: None Premorbid Home Care/DME Agencies: Other (Comment) (rw) Transportation available at discharge: self Is the patient able to respond to transportation needs?: Yes In the past 12 months, has lack of transportation kept you from medical appointments or from getting medications?: No In the past 12 months, has lack of transportation kept you from meetings, work, or from getting things needed for daily living?: No Resource referrals recommended: Neuropsychology  Discharge Planning Living Arrangements: Alone Support Systems: Other relatives Type of Residence: Private residence Insurance Resources: [employer], Multimedia programmer (specify) ([place]) Financial Resources: [place] Referred: No Living Expenses: Rent Money Management: Patient Does the patient have any problems obtaining your medications?: No Home Management: self Patient/Family Preliminary Plans: Pt is unsure what his plans are he may want to go back to his apartment or go to sister's. He needs to have that conversation with her. Aware being evaluated and goals being set. Care Coordinator Barriers to Discharge: Decreased caregiver support, Lack of/limited family support, Medication compliance Care Coordinator Anticipated Follow Up Needs: HH/OP  Clinical Impression Pleasant gentleman who not quite sure realizes the care he may need at discharge and that he will not be driving. His sister lives in [place] and may be able to have him come there, both need to have that conversation. Pt is a veteran but not connected with [facility]. He has no supports locally. Will await team evaluations and work on safe plan for discharge. Aware this worker is covering and Harlan Stains will return Monday and follow  Bessie Livingood, Gardiner Rhyme 09/02/2021, 10:27 AM

## 2021-09-02 NOTE — Progress Notes (Signed)
Inpatient Rehabilitation  Patient information reviewed and entered into eRehab system by Tehillah Cipriani M. Junko Ohagan, M.A., CCC/SLP, PPS Coordinator.  Information including medical coding, functional ability and quality indicators will be reviewed and updated through discharge.    

## 2021-09-02 NOTE — Progress Notes (Addendum)
PMR Admission Coordinator Pre-Admission Assessment   Patient: Keyvon Herter is an 71 y.o., male MRN: [id number] DOB: [birth year] Height: '6\' 2"'  (188 cm) Weight: 83.9 kg   Insurance Information HMO:     PPO:      PCP:      IPA:      80/20:      OTHER:  PRIMARY: [place]      Policy#: [id number]      Subscriber: patient CM Name: Rubin Payor      Phone#: [phone]    Fax#: [phone] Pre-Cert#: [id number] [place] from [place] with [place], updates due to fax listed above on 10/01/2021     Employer:  Benefits:  Phone #: [phone]     Name:  Eff. Date: 07/25/2017     Deduct: $0      Out of Pocket Max: $0      Life Max: n/a CIR: 100%      SNF: 100% Outpatient: 100%     Co-Pay:  Home Health: 100%      Co-Pay:  DME: 100%     Co-Pay:  Providers:  SECONDARY: medicare A/B (08/02/31)    Policy#:  [id number]    Phone#:    Financial Counselor:         Phone#:    The "Data Collection Information Summary" for patients in Inpatient Rehabilitation Facilities with attached "Privacy Act [place] Records" was provided and verbally reviewed with: N/A   Emergency Contact Information Contact Information       Name Relation Home Work Mobile    Schrum,Cheryl Sister     [phone]           Current Medical History  Patient Admitting Diagnosis: L BKA  History of Present Illness: Patient is a 71 year old male who arrived at emergency department with concerns of L foot infection. PMH includes: type 2 DM, HTN and DVT on eliquis.  Patient's white blood cell count  was 14, A1c 10.7, albumin 2.7 on admission. Patient also with Afib with RVR. Responded poorly to IV diltiazem until IV metoprolol was added. Briefly required cardizem and heparin.  Patient found to have necrotic abscess of left foot. Patient is s/p great toe and second toe amputation prior to this admission. On 6/28 patient underwent transmetatarsal amputation per Dr. Sharol Given. Post op, abscess and necrotic tissue found to be extending to the  hindfoot and on 08/29/21 patient underwent left below the knee amputation per Dr. Sharol Given. Therapies consulted and recommend CIR.      Patient's medical record from [facility] has been reviewed by the rehabilitation admission coordinator and physician.   Past Medical History      Past Medical History:  Diagnosis Date   Atrial fibrillation ([place])     Diabetes mellitus without complication ([place])     DVT (deep venous thrombosis) ([place])     Hypertension        Has the patient had major surgery during 100 days prior to admission? Yes   Family History   family history is not on file.   Current Medications   Current Facility-Administered Medications:    0.9 %  sodium chloride infusion, , Intravenous, Continuous, [name], MD   acetaminophen (TYLENOL) tablet 325-650 mg, 325-650 mg, Oral, Q6H PRN, [name], MD, 650 mg at 08/30/21 2027   alum & mag hydroxide-simeth (MAALOX/MYLANTA) 200-200-20 MG/5ML suspension 15-30 mL, 15-30 mL, Oral, Q2H PRN, [name], MD   apixaban Arne [place]) tablet 5 mg, 5  mg, Oral, BID, Pokhrel, Laxman, MD, 5 mg at 09/01/21 2248   ascorbic acid (VITAMIN C) tablet 1,000 mg, 1,000 mg, Oral, Daily, [name], MD, 1,000 mg at 09/01/21 0902   bisacodyl (DULCOLAX) EC tablet 5 mg, 5 mg, Oral, Daily PRN, [name], MD   dextrose 50 % solution 0-50 mL, 0-50 mL, Intravenous, PRN, [name], MD   diltiazem (CARDIZEM SR) 12 hr capsule 120 mg, 120 mg, Oral, Q12H, [name], MD, 120 mg at 09/01/21 0901   docusate sodium (COLACE) capsule 100 mg, 100 mg, Oral, Daily, [name], MD, 100 mg at 09/01/21 0901   feeding supplement (GLUCERNA SHAKE) (GLUCERNA SHAKE) liquid 237 mL, 237 mL, Oral, TID BM, [name], MD, 237 mL at 09/01/21 0908   guaiFENesin-dextromethorphan (ROBITUSSIN DM) 100-10 MG/5ML syrup 15 mL, 15 mL, Oral, Q4H PRN, [name], MD   hydrALAZINE (APRESOLINE) injection 5 mg, 5 mg, Intravenous, Q20 Min PRN, [name],  MD   HYDROmorphone (DILAUDID) injection 0.5-1 mg, 0.5-1 mg, Intravenous, Q4H PRN, [name], MD, 1 mg at 08/30/21 1747   insulin aspart (novoLOG) injection 0-9 Units, 0-9 Units, Subcutaneous, TID WC, [name], MD, 7 Units at 09/01/21 0903   insulin aspart (novoLOG) injection 4 Units, 4 Units, Subcutaneous, TID WC, Pokhrel, Laxman, MD, 4 Units at 09/01/21 0903   insulin detemir (LEVEMIR) injection 15 Units, 15 Units, Subcutaneous, BID, Pokhrel, Laxman, MD, 15 Units at 09/01/21 0902   labetalol (NORMODYNE) injection 10 mg, 10 mg, Intravenous, Q10 min PRN, [name], MD   magnesium oxide (MAG-OX) tablet 400 mg, 400 mg, Oral, BID, [name], MD, 400 mg at 09/01/21 0901   magnesium sulfate IVPB 2 g 50 mL, 2 g, Intravenous, Daily PRN, [name], MD   metoprolol tartrate (LOPRESSOR) injection 2-5 mg, 2-5 mg, Intravenous, Q2H PRN, [name], MD   metoprolol tartrate (LOPRESSOR) tablet 50 mg, 50 mg, Oral, BID, [name], MD, 50 mg at 09/01/21 0901   multivitamin with minerals tablet 1 tablet, 1 tablet, Oral, q morning, Pokhrel, Laxman, MD, 1 tablet at 09/01/21 2500   nicotine (NICODERM CQ - dosed in mg/24 hours) patch 21 mg, 21 mg, Transdermal, Daily, [name], MD, 21 mg at 09/01/21 3704   nutrition supplement (JUVEN) (JUVEN) powder packet 1 packet, 1 packet, Oral, BID, [name], MD, 1 packet at 09/01/21 0902   ondansetron (ZOFRAN) injection 4 mg, 4 mg, Intravenous, Q6H PRN, [name], MD   oxyCODONE (Oxy IR/ROXICODONE) immediate release tablet 10-15 mg, 10-15 mg, Oral, Q4H PRN, [name], MD, 15 mg at 09/01/21 0901   oxyCODONE (Oxy IR/ROXICODONE) immediate release tablet 5-10 mg, 5-10 mg, Oral, Q4H PRN, [name], MD, 10 mg at 08/30/21 0933   pantoprazole (PROTONIX) EC tablet 40 mg, 40 mg, Oral, Daily, [name], MD, 40 mg at 09/01/21 0905   phenol (CHLORASEPTIC) mouth spray 1 spray, 1 spray, Mouth/Throat, PRN, [name], MD    polyethylene glycol (MIRALAX / GLYCOLAX) packet 17 g, 17 g, Oral, Daily PRN, [name], MD   potassium chloride SA (KLOR-CON M) CR tablet 20-40 mEq, 20-40 mEq, Oral, Daily PRN, [name], MD   sacubitril-valsartan (ENTRESTO) 24-26 mg per tablet, 1 tablet, Oral, BID, Charolette Forward, MD, 1 tablet at 09/01/21 8889   zinc sulfate capsule 220 mg, 220 mg, Oral, Daily, [name], MD, 220 mg at 09/01/21  0902   Patients Current Diet:  Diet Order                  Diet Carb Modified             Diet Carb Modified Fluid consistency: Thin; Room service appropriate? Yes  Diet effective now                         Precautions / Restrictions Precautions Precautions: Fall Precaution Comments: wound vac Other Brace: L limb protector Restrictions Weight Bearing Restrictions: Yes LLE Weight Bearing: Non weight bearing    Has the patient had 2 or more falls or a fall with injury in the past year? No   Prior Activity Level   Prior Functional Level Self Care: Did the patient need help bathing, dressing, using the toilet or eating? Independent   Indoor Mobility: Did the patient need assistance with walking from room to room (with or without device)? Independent   Stairs: Did the patient need assistance with internal or external stairs (with or without device)? Independent   Functional Cognition: Did the patient need help planning regular tasks such as shopping or remembering to take medications? Independent   Patient Information Are you of Hispanic, Latino/a,or Spanish origin?: A. No, not of Hispanic, Latino/a, or Spanish origin What is your race?: A. White Do you need or want an interpreter to communicate with a doctor or health care staff?: 0. No   Patient's Response To:  Health Literacy and Transportation Is the patient able to respond to health literacy and transportation needs?: Yes Health Literacy - How often do you need to have someone help you when you read  instructions, pamphlets, or other written material from your doctor or pharmacy?: Never In the past 12 months, has lack of transportation kept you from medical appointments or from getting medications?: No In the past 12 months, has lack of transportation kept you from meetings, work, or from getting things needed for daily living?: No   Home Assistive Devices / [place] Devices/Equipment: Environmental consultant (specify type) Home Equipment: Rolling Walker (2 wheels)   Prior Device Use: Indicate devices/aids used by the patient prior to current illness, exacerbation or injury? None of the above   Current Functional Level Cognition   Overall Cognitive Status: History of cognitive impairments - at baseline Current Attention Level: Selective Orientation Level: Oriented X4 Following Commands: Follows one step commands with increased time Safety/Judgement: Decreased awareness of safety General Comments: Hx of STM deficits. Had to orient patient to date.    Extremity Assessment (includes Sensation/Coordination)   Upper Extremity Assessment: Generalized weakness  Lower Extremity Assessment: Defer to PT evaluation LLE Deficits / Details: s/p BKA. Able to perform quad set and SLR     ADLs   Overall ADL's : Needs assistance/impaired Eating/Feeding: Independent, Sitting Grooming: Set up, Sitting Upper Body Bathing: Set up, Sitting Lower Body Bathing: Maximal assistance, Sitting/lateral leans Upper Body Dressing : Set up, Sitting Lower Body Dressing: Maximal assistance, Sit to/from stand Toilet Transfer: Maximal assistance, Stand-pivot, BSC/3in1 Toilet Transfer Details (indicate cue type and reason): recommend lateral scoot to drop arm Toileting- Clothing Manipulation and Hygiene: Maximal assistance, Sit to/from stand Functional mobility during ADLs: Maximal assistance General ADL Comments: limited by pain and poor command following. squat pivot transfer from chair>bed with 1x sit<>stand      Mobility   Overal bed mobility: Needs Assistance Bed Mobility: Supine to Sit Supine to sit: Min guard  Sit to supine: Min guard General bed mobility comments: Close min guard for safety, exiting towards left side of bed     Transfers   Overall transfer level: Needs assistance Equipment used: Rolling walker (2 wheels) Transfers: Sit to/from Stand Sit to Stand: Mod assist, From elevated surface Bed to/from chair/wheelchair/BSC transfer type:: Squat pivot Stand pivot transfers: Mod assist Squat pivot transfers: Max assist Step pivot transfers: Min assist, +2 physical assistance, +2 safety/equipment General transfer comment: Educated on technique and hand placement. Light mod assist for boost and balance, bed slightly elevated. Practed x2 from bed.     Ambulation / Gait / Stairs / Wheelchair Mobility   Ambulation/Gait Ambulation/Gait assistance: Mod assist Gait Distance (Feet): 5 Feet Assistive device: Rolling walker (2 wheels) Gait Pattern/deviations:  (hop) General Gait Details: Educated on sequencing with RW. Mod assist for RW control in forward direction approx 5 feet. Adequte foot clearance. RW adjusted for improved UE leverage and efficiency but may need adjusted again in the future. Slight assist with balance, VC throughout. Mod assist with turns and educated on backwards steps with assistive device. Very fatigued and somewhat anxious but performed well. Gait velocity: slow Gait velocity interpretation: <1.31 ft/sec, indicative of household ambulator Pre-gait activities: Standing pre-gait activities with weight shift, RW adjusted, and use of UEs to clearn foot from ground as well as making pivot turns.     Posture / Balance Balance Overall balance assessment: Needs assistance Sitting-balance support: Feet supported Sitting balance-Leahy Scale: Good Standing balance support: Reliant on assistive device for balance Standing balance-Leahy Scale: Poor     Special needs/care  consideration Skin wound care    Previous Home Environment (from acute therapy documentation) Living Arrangements: Alone Available Help at Discharge: Family Type of Home: Apartment Home Layout: One level Home Access: Level entry Bathroom Shower/Tub: Tub/shower unit (with a walk-in cut out) Bathroom Toilet: Standard Bathroom Accessibility: Yes How Accessible: Accessible via walker Home Care Services: No Additional Comments: Plans to move to [place] to live near sister   Discharge Living Setting Plans for Discharge Living Setting: Other (Comment) (this is patient's home set up, he could potentially go to his brother's home in [place] or his sister's home in [place].) Type of Home at Discharge: Apartment Discharge Home Layout: One level Discharge Home Access: Level entry Discharge Bathroom Shower/Tub: Tub/shower unit Discharge Bathroom Toilet: Standard Discharge Bathroom Accessibility: Yes How Accessible: Accessible via walker Does the patient have any problems obtaining your medications?: No   Social/Family/Support Systems Patient Roles:  (N/A) Anticipated Caregiver: Lenox Ahr Anticipated Caregiver's Contact Information: [phone] Ability/Limitations of Caregiver: can provide supervision, no physical assist Caregiver Availability: 24/7 Discharge Plan Discussed with Primary Caregiver: Yes Is Caregiver In Agreement with Plan?: Yes Does Caregiver/Family have Issues with Lodging/Transportation while Pt is in Rehab?: No   Goals Patient/Family Goal for Rehab: mod I for wheelchair level, supervision for short gait distances Expected length of stay: 10-14 days Pt/Family Agrees to Admission and willing to participate: Yes Program Orientation Provided & Reviewed with Pt/Caregiver Including Roles  & Responsibilities: Yes  Barriers to Discharge: Insurance for SNF coverage   Decrease burden of Care through IP rehab admission: N/A   Possible need for SNF placement upon discharge: not  aniticpated   Patient Condition: I have reviewed medical records from [facility], spoken with  [facility] , and patient and family member. I met with patient at the bedside and discussed via phone for inpatient rehabilitation assessment.  Patient will benefit from ongoing PT and  OT, can actively participate in 3 hours of therapy a day 5 days of the week, and can make measurable gains during the admission.  Patient will also benefit from the coordinated team approach during an Inpatient Acute Rehabilitation admission.  The patient will receive intensive therapy as well as Rehabilitation physician, nursing, social worker, and care management interventions.  Due to safety, skin/wound care, disease management, medication administration, pain management, and patient education the patient requires 24 hour a day rehabilitation nursing.  The patient is currently Mod A with mobility and basic ADLs.  Discharge setting and therapy post discharge at home with home health is anticipated.  Patient has agreed to participate in the Acute Inpatient Rehabilitation Program and will admit today.   Preadmission Screen Completed By:  Michel Santee, PT, DPT 09/01/2021 10:34 AM ______________________________________________________________________   Discussed status with Dr. Naaman Plummer on 09/01/21  at 10:34 AM  and received approval for admission today.   Admission Coordinator:  Michel Santee, PT, DPT time 10:34 AM Sudie Grumbling 09/01/21     Assessment/Plan: Diagnosis: left bka Does the need for close, 24 hr/day Medical supervision in concert with the patient's rehab needs make it unreasonable for this patient to be served in a less intensive setting? Yes Co-Morbidities requiring supervision/potential complications: a fib, dm, htn Due to bladder management, bowel management, safety, skin/wound care, disease management, medication administration, pain management, and patient education, does the patient require 24 hr/day rehab  nursing? Yes Does the patient require coordinated care of a physician, rehab nurse, PT, OT, and SLP to address physical and functional deficits in the context of the above medical diagnosis(es)? Yes Addressing deficits in the following areas: balance, endurance, locomotion, strength, transferring, bowel/bladder control, bathing, dressing, feeding, grooming, toileting, and psychosocial support Can the patient actively participate in an intensive therapy program of at least 3 hrs of therapy 5 days a week? Yes The potential for patient to make measurable gains while on inpatient rehab is excellent Anticipated functional outcomes upon discharge from inpatient rehab: modified independent PT, modified independent OT, n/a SLP, supervision for gait Estimated rehab length of stay to reach the above functional goals is: 10-14 days Anticipated discharge destination: Home 10. Overall Rehab/Functional Prognosis: excellent     MD Signature: Meredith Staggers, MD, [place] Director Rehabilitation Services 09/01/2021

## 2021-09-02 NOTE — Progress Notes (Signed)
[facility] Individual Statement of Services  Patient Name:  Jaquarius Seder  Date:  09/02/2021  Welcome to the [facility].  Our goal is to provide you with an individualized program based on your diagnosis and situation, designed to meet your specific needs.  With this comprehensive rehabilitation program, you will be expected to participate in at least 3 hours of rehabilitation therapies Monday-Friday, with modified therapy programming on the weekends.  Your rehabilitation program will include the following services:  Physical Therapy (PT), Occupational Therapy (OT), 24 hour per day rehabilitation nursing, Therapeutic Recreaction (TR), Neuropsychology, Care Coordinator, Rehabilitation Medicine, Nutrition Services, and Pharmacy Services  Weekly team conferences will be held on Tuesday to discuss your progress.  Your Inpatient Rehabilitation Care Coordinator will talk with you frequently to get your input and to update you on team discussions.  Team conferences with you and your family in attendance may also be held.  Expected length of stay: 12-14 days  Overall anticipated outcome: supervision with verbal cues  Depending on your progress and recovery, your program may change. Your Inpatient Rehabilitation Care Coordinator will coordinate services and will keep you informed of any changes. Your Inpatient Rehabilitation Care Coordinator's name and contact numbers are listed  below.  The following services may also be recommended but are not provided by the [facility] will be made to provide these services after discharge if needed.  Arrangements include referral to agencies that provide these services.  Your insurance has been verified to be:  medicare and VA Your primary doctor is:  None  Pertinent information will be shared  with your doctor and your insurance company.  Inpatient Rehabilitation Care Coordinator:  Cathleen Corti [phone] or ([phone]  Information discussed with and copy given to patient by: Elease Hashimoto, 09/02/2021, 10:31 AM

## 2021-09-02 NOTE — Plan of Care (Signed)
  Problem: RH Balance Goal: LTG Patient will maintain dynamic standing balance (PT) Description: LTG:  Patient will maintain dynamic standing balance with assistance during mobility activities (PT) Flowsheets (Taken 09/02/2021 1645) LTG: Pt will maintain dynamic standing balance during mobility activities with:: Supervision/Verbal cueing   Problem: Sit to Stand Goal: LTG:  Patient will perform sit to stand with assistance level (PT) Description: LTG:  Patient will perform sit to stand with assistance level (PT) Flowsheets (Taken 09/02/2021 1645) LTG: PT will perform sit to stand in preparation for functional mobility with assistance level: Supervision/Verbal cueing   Problem: RH Bed Mobility Goal: LTG Patient will perform bed mobility with assist (PT) Description: LTG: Patient will perform bed mobility with assistance, with/without cues (PT). Flowsheets (Taken 09/02/2021 1645) LTG: Pt will perform bed mobility with assistance level of: Independent   Problem: RH Bed to Chair Transfers Goal: LTG Patient will perform bed/chair transfers w/assist (PT) Description: LTG: Patient will perform bed to chair transfers with assistance (PT). Flowsheets (Taken 09/02/2021 1645) LTG: Pt will perform Bed to Chair Transfers with assistance level: Supervision/Verbal cueing   Problem: RH Car Transfers Goal: LTG Patient will perform car transfers with assist (PT) Description: LTG: Patient will perform car transfers with assistance (PT). Flowsheets (Taken 09/02/2021 1645) LTG: Pt will perform car transfers with assist:: Supervision/Verbal cueing   Problem: RH Ambulation Goal: LTG Patient will ambulate in controlled environment (PT) Description: LTG: Patient will ambulate in a controlled environment, # of feet with assistance (PT). Flowsheets (Taken 09/02/2021 1645) LTG: Pt will ambulate in controlled environ  assist needed:: Supervision/Verbal cueing LTG: Ambulation distance in controlled environment: 50 ft with  LRAD Goal: LTG Patient will ambulate in home environment (PT) Description: LTG: Patient will ambulate in home environment, # of feet with assistance (PT). Flowsheets (Taken 09/02/2021 1645) LTG: Pt will ambulate in home environ  assist needed:: Supervision/Verbal cueing LTG: Ambulation distance in home environment: 25 ft with LRAD   Problem: RH Wheelchair Mobility Goal: LTG Patient will propel w/c in controlled environment (PT) Description: LTG: Patient will propel wheelchair in controlled environment, # of feet with assist (PT) Flowsheets (Taken 09/02/2021 1645) LTG: Pt will propel w/c in controlled environ  assist needed:: Independent with assistive device LTG: Propel w/c distance in controlled environment: 150 ft Goal: LTG Patient will propel w/c in home environment (PT) Description: LTG: Patient will propel wheelchair in home environment, # of feet with assistance (PT). Flowsheets (Taken 09/02/2021 1645) LTG: Pt will propel w/c in home environ  assist needed:: Independent with assistive device LTG: Propel w/c distance in home environment: 75 ft

## 2021-09-03 LAB — GLUCOSE, CAPILLARY
Glucose-Capillary: 277 mg/dL — ABNORMAL HIGH (ref 70–99)
Glucose-Capillary: 168 mg/dL — ABNORMAL HIGH (ref 70–99)
Glucose-Capillary: 160 mg/dL — ABNORMAL HIGH (ref 70–99)
Glucose-Capillary: 174 mg/dL — ABNORMAL HIGH (ref 70–99)

## 2021-09-03 LAB — SURGICAL PATHOLOGY

## 2021-09-03 MED ORDER — SORBITOL 70 % SOLN
30.0000 mL | Freq: Once | Status: AC
  Administered 2021-09-03: 30 mL via ORAL
  Filled 2021-09-03: qty 30

## 2021-09-03 MED ORDER — DULOXETINE HCL 30 MG PO CPEP
30.0000 mg | ORAL_CAPSULE | Freq: Every day | ORAL | Status: DC
  Administered 2021-09-03 – 2021-09-06 (×4): 30 mg via ORAL
  Filled 2021-09-03 (×4): qty 1

## 2021-09-03 NOTE — Progress Notes (Signed)
Occupational Therapy Session Note  Patient Details  Name: Randall Coleman MRN: [id number] Date of Birth: 1951/02/06  Today's Date: 09/03/2021 OT Individual Time: 4650-3546 OT Individual Time Calculation (min): 72 min    Short Term Goals: Week 1:  OT Short Term Goal 1 (Week 1): Pt will complete toileting with minimal assistance. OT Short Term Goal 2 (Week 1): Pt wil complete UB dressing with set-up assistance. OT Short Term Goal 3 (Week 1): Pt will complete LB dressing with minimal assistance using LRAD and AE as needed. OT Short Term Goal 4 (Week 1): Pt will complete bathing tasks with supervison using AE and DME. OT Short Term Goal 5 (Week 1): Pt will complete toilet transfer with minimal assistance using LRAD.  Skilled Therapeutic Interventions/Progress Updates:    Pt resting in w/c upon arrival. Pt declined bathing/dressing this morning. OT intervention with focus on discharge planning, DME requirements, w/c mobility, BUE therex, sit<>stand, standing balance, functional transfers, and safety awareness to increase independence with BADLs. W/c mobility in hallway with supervision. 7 mins SciFit random level 3 with pt report of BUE fatigue at end. Sit<>stand with CGA. Min A for standing balance. Pt's RLE fatigues quickly as evidenced with fatigue following 3 hops during bed tranfser requiring max A to maintain standing. Squat pivot transfers with min A and max verbal cues for sequencing and safety. Pt tranfserred back to bed with min A. Pt remained in bed with all needs within reach and bed alarm activated.   Therapy Documentation Precautions:  Precautions Precautions: Fall Precaution Comments: wound vac Required Braces or Orthoses: Other Brace Other Brace: L limb protector Restrictions Weight Bearing Restrictions: Yes LLE Weight Bearing: Non weight bearing   Pain:  Pt c/o LLE phantom pain (unrated); requested meds at end of session   Therapy/Group: Individual Therapy  Leroy Libman 09/03/2021, 10:42 AM

## 2021-09-03 NOTE — Progress Notes (Signed)
PROGRESS NOTE   Subjective/Complaints:  LBM 3 days ago- feels like needs help to go.  Can't remember what did in therapy yesterday but thinks it went well.  Nerve/phantom pain not better- it's worse than residual limb pain.  Ate 100% tray.   Educated pt that phantom pain meds take a few days to kick in- he voiced understanding.    ROS:  Pt denies SOB, abd pain, CP, N/V/C/D, and vision changes   Objective:   No results found. Recent Labs    09/01/21 0231 09/02/21 0513  WBC 7.4 7.5  HGB 10.6* 11.6*  HCT 32.1* 34.5*  PLT 279 309   Recent Labs    09/01/21 0231 09/02/21 0513  NA 133* 134*  K 4.5 4.2  CL 101 100  CO2 27 27  GLUCOSE 268* 206*  BUN 16 10  CREATININE 0.66 0.66  CALCIUM 8.2* 8.5*    Intake/Output Summary (Last 24 hours) at 09/03/2021 0756 Last data filed at 09/03/2021 0744 Gross per 24 hour  Intake 977 ml  Output 1300 ml  Net -323 ml        Physical Exam: Vital Signs Blood pressure 125/73, pulse 86, temperature 97.8 F (36.6 C), temperature source Oral, resp. rate 18, height '6\' 2"'$  (1.88 m), SpO2 96 %.     General: awake, alert, appropriate, sitting up in bed; just woke up- ate 100% tray; NAD HENT: conjugate gaze; oropharynx moist CV: regular rate; no JVD Pulmonary: CTA B/L; no W/R/R- good air movement GI: soft, NT, ND, (+)BS Psychiatric: appropriate- but oculdn't remember therapy yesterday? Neurological: alert Musculoskeletal:     Comments: Left BK stump bulbous.tender -wearing shrinker and limb guard Skin:    General: Skin is warm and dry.     Comments: Vac dressing in place. Onychomycosis of right toenails.   Neurological:     Mental Status: He is alert and oriented to person, place, and time.     Cranial Nerves: No cranial nerve deficit.     Comments: Oriented to self and situation--place- [facility]/ --needed cues for [facility]. Month "June" had to look at phone to figure that it  was July 4th. CN exam intact. UE motor 5/5. RLE 4/5. LLE 3/5 HF, KE/KF. Decreased LT distal RLE below ankles   Assessment/Plan: 1. Functional deficits which require 3+ hours per day of interdisciplinary therapy in a comprehensive inpatient rehab setting. Physiatrist is providing close team supervision and 24 hour management of active medical problems listed below. Physiatrist and rehab team continue to assess barriers to discharge/monitor patient progress toward functional and medical goals  Care Tool:  Bathing    Body parts bathed by patient: Right arm, Left arm, Chest, Abdomen, Front perineal area, Right upper leg, Left upper leg, Face   Body parts bathed by helper: Right lower leg Body parts n/a: Left lower leg   Bathing assist Assist Level: Minimal Assistance - Patient > 75%     Upper Body Dressing/Undressing Upper body dressing   What is the patient wearing?: Pull over shirt    Upper body assist Assist Level: Supervision/Verbal cueing    Lower Body Dressing/Undressing Lower body dressing      What is the  patient wearing?: Pants, Underwear/pull up     Lower body assist Assist for lower body dressing: Maximal Assistance - Patient 25 - 49%     Toileting Toileting    Toileting assist Assist for toileting: Maximal Assistance - Patient 25 - 49%     Transfers Chair/bed transfer  Transfers assist     Chair/bed transfer assist level: Moderate Assistance - Patient 50 - 74%     Locomotion Ambulation   Ambulation assist   Ambulation activity did not occur: Safety/medical concerns          Walk 10 feet activity   Assist  Walk 10 feet activity did not occur: Safety/medical concerns        Walk 50 feet activity   Assist Walk 50 feet with 2 turns activity did not occur: Safety/medical concerns         Walk 150 feet activity   Assist Walk 150 feet activity did not occur: Safety/medical concerns         Walk 10 feet on uneven surface   activity   Assist Walk 10 feet on uneven surfaces activity did not occur: Safety/medical concerns         Wheelchair     Assist Is the patient using a wheelchair?: Yes Type of Wheelchair: Manual Wheelchair activity did not occur: Refused  Wheelchair assist level: Supervision/Verbal cueing Max wheelchair distance: 150'    Wheelchair 50 feet with 2 turns activity    Assist    Wheelchair 50 feet with 2 turns activity did not occur: Refused   Assist Level: Supervision/Verbal cueing   Wheelchair 150 feet activity     Assist  Wheelchair 150 feet activity did not occur: Refused   Assist Level: Supervision/Verbal cueing   Blood pressure 125/73, pulse 86, temperature 97.8 F (36.6 C), temperature source Oral, resp. rate 18, height '6\' 2"'$  (1.88 m), SpO2 96 %.  Medical Problem List and Plan: 1. 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  2.  Antithrombotics: -DVT/anticoagulation:  Pharmaceutical: Other (comment)--Eliquis             -antiplatelet therapy:  N/A 3. Pain Management: Oxycodone prn             -add gabapentin '100mg'$  TID for phantom limb pain  7/5- will increase gabapentin to 300 mg TID for phantom pain- and might need Cymbalta as well  7/6- will add Cymbalta 30 mg QHS for pt and con't other meds- con't regimen otherwise             -discussed massage and desensitization 4. Mood/Sleep: LCSW to follow for evaluation and support.              -antipsychotic agents: N/A 5. Neuropsych/cognition: This patient is capable of making decisions on his own behalf. 6. Skin/Wound Care: Continue wound VAC for 7 days post-op  7/6- VAC to be d/c'd 09/05/21  7. Fluids/Electrolytes/Nutrition: Has been on IVF since admission-->d/c.  --Check  CMET in am             --encourage intake. Will change glucerna to TID with meals to avoid BS spikes             --  continue Juven, Vitamin C and Zinc to promote wound healing.  8. A fib: Monitor HR TID--currently controlled on Cardizem 120 mg BID and metoprolol 50 mg bid             --on Eliquis w/ H/H stable 9. T2DM: Hgb A1c-7.9-- 10.7 in 4 months likely due to infection/now controlled.   -- Mild DKA-->beta hydroxybutyric acid 3.55-->0.91 --will continue to monitor BS ac/hs and titrate meds as indicated.              --Was on Metformin 1000 mg bid at home             --continue Levemir (increased to 15 u BID 07/04) w/ 4 units novolog  ac TID. Titrate as indicated.  7/5- Cbg's 200-300s- just got increase yesterday- will add metformin 500 mg BID back to regimen.   7/6- CBG's look better this AM- 178- with addition of Metformin- will monitor and make changes tomorrow as required 10. Depressed systolic LVF: Started on Sacubitril-valsartan bid on 07/02. -Monitor renal function              -daily weights 11. ABLA/anemia of chronic illness: Monitor H/H with serial checks. 7/6- Hb 11.6- will monitor to monitor- doesn't want iron at this time.  12. Hypomagnesemia: Improved post IV supplement and on oral supplement -likely due to malnutrition. Will recheck in am.   7/5- Mg level is 1.9- will con't to monitor 13. Chronic hyponatremia: Improving. Will recheck Na level in am.    7/5- Na is 134- will monitor for trends.    I spent a total of 37   minutes on total care today- >50% coordination of care- due to d/w pt about phantom pain- education as well as bowels and also spoke to nursing about pt.    LOS: 2 days A FACE TO FACE EVALUATION WAS PERFORMED  Shermeka Rutt 09/03/2021, 7:56 AM

## 2021-09-03 NOTE — Progress Notes (Signed)
Physical Therapy Session Note  Patient Details  Name: Randall Coleman MRN: [id number] Date of Birth: 06/24/1950  Today's Date: 09/03/2021 PT Individual Time: 0800-0910; 8563-1497 PT Individual Time Calculation (min): 70 min and 70 min  Short Term Goals: Week 1:  PT Short Term Goal 1 (Week 1): Pt will perform least restrictive transfer with min A consistently PT Short Term Goal 2 (Week 1): Pt will maintain standing balance x 5 min while completing functional task with min A PT Short Term Goal 3 (Week 1): Pt will initiate gait training  Skilled Therapeutic Interventions/Progress Updates:    Session 1: Pt received seated in bed, agreeable to PT session. Pt reports 7/10 pain in L residual limb. Pt premedicated prior to start of therapy session and declines any Tylenol. Utilized rest breaks and repositioning as needed for pain management. Bed mobility Supervision. Sit to stand and transfer with RW to w/c with min A. Pt continues to require cues to to "hop" during transfer vs scooting RLE. Manual w/c propulsion 2 x 150 ft with use of BUE at Supervision level. Reviewed management of w/c leg rests with mod cueing needed. Stand pivot transfer to mat table with min A. Reviewed how to don/doff residual limb guard with mod cueing needed. Demonstrated how to perform desensitization of residual limb by touching limb with clean hands and encouraged pt to perform between therapy sessions. Sit to stand with min A to RW. Standing balance 2 x 30 sec with RW and min A for balance with B shoulder elevation noted. Pt also reports feeling lightheaded in standing, returned to sitting where symptoms resolve. Discussed importance of spending time sitting up OOB to improve upright tolerance, pt agreeable. Sit to stand with min A to RW at high/low table. Pt able to perform reaching with alt UE across midline for targets with one UE support on RW and min A for balance. Pt reports difficulty maintaining balance without BUE support.  Educated pt on purpose of task to work towards being able to perform ADL tasks in standing and needing to be able to let go of RW with one UE, pt understanding of purpose of task. Pt agreeable remain seated in w/c at end of session, needs in reach, quick release belt and chair alarm in place. Pt does exhibit short-term memory deficits during session including not being able to remember if he has received pain medication or not, not remembering which arm his nurse gave his insulin in minutes before, etc.  Session 2: Pt received seated in bed, agreeable to PT session. Pt reports ongoing pain in L residual limb, unable to receive pain mediation until later during session. Utilized repositioning and rest breaks as needed for pain management. Provided pt with notebook to keep track of questions and track his stay in the hospital so far due to short-term memory deficits. Bed mobility Supervision. Sit to stand and stand pivot transfer with RW and min A. Manual w/c propulsion 2 x 100 ft with use of BUE at Supervision level. Reviewed how to manage w/c leg rests with min cueing. Ambulation 2 x 5 ft with RW with min A increasing to mod A needed with onset of fatigue. Pt also exhibits increase in R knee flexion during gait with onset of fatigue. Nustep level 5 x 6 min with use of RLE and BUE for global strengthening. Pt reports feeling lightheaded following exercise. Seated BP 109/66, HR 73, SpO2 99%. Pt returned to bed at end of session, nursing in room to provide  medication. Pt left seated in bed with needs in reach, bed alarm in place.  Therapy Documentation Precautions:  Precautions Precautions: Fall Precaution Comments: wound vac Required Braces or Orthoses: Other Brace Other Brace: L limb protector Restrictions Weight Bearing Restrictions: Yes LLE Weight Bearing: Non weight bearing General:        Therapy/Group: Individual Therapy   Excell Seltzer, PT, DPT, CSRS 09/03/2021, 11:58 AM

## 2021-09-04 LAB — GLUCOSE, CAPILLARY
Glucose-Capillary: 186 mg/dL — ABNORMAL HIGH (ref 70–99)
Glucose-Capillary: 153 mg/dL — ABNORMAL HIGH (ref 70–99)
Glucose-Capillary: 149 mg/dL — ABNORMAL HIGH (ref 70–99)
Glucose-Capillary: 115 mg/dL — ABNORMAL HIGH (ref 70–99)

## 2021-09-04 MED ORDER — ENSURE MAX PROTEIN PO LIQD
11.0000 [oz_av] | Freq: Three times a day (TID) | ORAL | Status: DC
  Administered 2021-09-04 – 2021-09-14 (×26): 11 [oz_av] via ORAL

## 2021-09-04 MED ORDER — SACUBITRIL-VALSARTAN 24-26 MG PO TABS: 1.0000 | ORAL_TABLET | Freq: Two times a day (BID) | ORAL | 0 refills | Status: AC

## 2021-09-04 MED ORDER — METOPROLOL TARTRATE 50 MG PO TABS: 50.0000 mg | ORAL_TABLET | Freq: Two times a day (BID) | ORAL | 0 refills | Status: DC

## 2021-09-04 MED ORDER — PREGABALIN 50 MG PO CAPS
50.0000 mg | ORAL_CAPSULE | Freq: Three times a day (TID) | ORAL | Status: DC
  Administered 2021-09-04 – 2021-09-15 (×34): 50 mg via ORAL
  Filled 2021-09-04 (×34): qty 1

## 2021-09-04 MED ORDER — PANTOPRAZOLE SODIUM 40 MG PO TBEC: 40.0000 mg | DELAYED_RELEASE_TABLET | Freq: Every day | ORAL | 0 refills | Status: AC

## 2021-09-04 MED ORDER — ZINC SULFATE 220 (50 ZN) MG PO CAPS
220.0000 mg | ORAL_CAPSULE | Freq: Every day | ORAL | 0 refills | Status: AC
Start: 2021-09-04 — End: ?

## 2021-09-04 MED ORDER — METFORMIN HCL 850 MG PO TABS
850.0000 mg | ORAL_TABLET | Freq: Two times a day (BID) | ORAL | Status: DC
  Administered 2021-09-04 – 2021-09-10 (×14): 850 mg via ORAL
  Filled 2021-09-04 (×15): qty 1

## 2021-09-04 MED ORDER — JUVEN PO PACK: 1.0000 | PACK | Freq: Two times a day (BID) | ORAL | 0 refills | Status: DC

## 2021-09-04 MED ORDER — APIXABAN 5 MG PO TABS: 5.0000 mg | ORAL_TABLET | Freq: Two times a day (BID) | ORAL | 0 refills | Status: AC

## 2021-09-04 MED ORDER — DILTIAZEM HCL ER 120 MG PO CP12: 120.0000 mg | ORAL_CAPSULE | Freq: Two times a day (BID) | ORAL | 0 refills | Status: DC

## 2021-09-04 NOTE — Progress Notes (Signed)
PROGRESS NOTE   Subjective/Complaints:  LBM 3 days ago- feels like needs help to go.  Can't remember what did in therapy yesterday but thinks it went well.  Nerve/phantom pain not better- it's worse than residual limb pain.  Ate 100% tray.   Educated pt that phantom pain meds take a few days to kick in- he voiced understanding.    ROS:  Pt denies SOB, abd pain, CP, N/V/C/D, and vision changes   Objective:   No results found. Recent Labs    09/02/21 0513  WBC 7.5  HGB 11.6*  HCT 34.5*  PLT 309   Recent Labs    09/02/21 0513  NA 134*  K 4.2  CL 100  CO2 27  GLUCOSE 206*  BUN 10  CREATININE 0.66  CALCIUM 8.5*    Intake/Output Summary (Last 24 hours) at 09/04/2021 0743 Last data filed at 09/04/2021 0700 Gross per 24 hour  Intake 1540 ml  Output 1450 ml  Net 90 ml        Physical Exam: Vital Signs Blood pressure 107/68, pulse 71, temperature 98 F (36.7 C), resp. rate 18, height '6\' 2"'$  (1.88 m), SpO2 98 %.     General: awake, alert, appropriate, sitting up in bed; just woke up- ate 100% tray; NAD HENT: conjugate gaze; oropharynx moist CV: regular rate; no JVD Pulmonary: CTA B/L; no W/R/R- good air movement GI: soft, NT, ND, (+)BS Psychiatric: appropriate- but oculdn't remember therapy yesterday? Neurological: alert Musculoskeletal:     Comments: Left BK stump bulbous.tender -wearing shrinker and limb guard Skin:    General: Skin is warm and dry.     Comments: Vac dressing in place. Onychomycosis of right toenails.   Neurological:     Mental Status: He is alert and oriented to person, place, and time.     Cranial Nerves: No cranial nerve deficit.     Comments: Oriented to self and situation--place- [facility]/[place] --needed cues for [facility]. Month "June" had to look at phone to figure that it was July 4th. CN exam intact. UE motor 5/5. RLE 4/5. LLE 3/5 HF, KE/KF. Decreased LT distal RLE below ankles    Assessment/Plan: 1. Functional deficits which require 3+ hours per day of interdisciplinary therapy in a comprehensive inpatient rehab setting. Physiatrist is providing close team supervision and 24 hour management of active medical problems listed below. Physiatrist and rehab team continue to assess barriers to discharge/monitor patient progress toward functional and medical goals  Care Tool:  Bathing    Body parts bathed by patient: Right arm, Left arm, Chest, Abdomen, Front perineal area, Right upper leg, Left upper leg, Face   Body parts bathed by helper: Right lower leg Body parts n/a: Left lower leg   Bathing assist Assist Level: Minimal Assistance - Patient > 75%     Upper Body Dressing/Undressing Upper body dressing   What is the patient wearing?: Pull over shirt    Upper body assist Assist Level: Supervision/Verbal cueing    Lower Body Dressing/Undressing Lower body dressing      What is the patient wearing?: Pants, Underwear/pull up     Lower body assist Assist for lower body dressing: Maximal Assistance -  Patient 25 - 49%     Toileting Toileting    Toileting assist Assist for toileting: Maximal Assistance - Patient 25 - 49%     Transfers Chair/bed transfer  Transfers assist     Chair/bed transfer assist level: Moderate Assistance - Patient 50 - 74%     Locomotion Ambulation   Ambulation assist   Ambulation activity did not occur: Safety/medical concerns  Assist level: Moderate Assistance - Patient 50 - 74% Assistive device: Walker-rolling Max distance: 5'   Walk 10 feet activity   Assist  Walk 10 feet activity did not occur: Safety/medical concerns        Walk 50 feet activity   Assist Walk 50 feet with 2 turns activity did not occur: Safety/medical concerns         Walk 150 feet activity   Assist Walk 150 feet activity did not occur: Safety/medical concerns         Walk 10 feet on uneven surface   activity   Assist Walk 10 feet on uneven surfaces activity did not occur: Safety/medical concerns         Wheelchair     Assist Is the patient using a wheelchair?: Yes Type of Wheelchair: Manual Wheelchair activity did not occur: Refused  Wheelchair assist level: Supervision/Verbal cueing Max wheelchair distance: 150'    Wheelchair 50 feet with 2 turns activity    Assist    Wheelchair 50 feet with 2 turns activity did not occur: Refused   Assist Level: Supervision/Verbal cueing   Wheelchair 150 feet activity     Assist  Wheelchair 150 feet activity did not occur: Refused   Assist Level: Supervision/Verbal cueing   Blood pressure 107/68, pulse 71, temperature 98 F (36.7 C), resp. rate 18, height '6\' 2"'$  (1.88 m), SpO2 98 %.  Medical Problem List and Plan: 1. 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  2.  Antithrombotics: -DVT/anticoagulation:  Pharmaceutical: Other (comment)--Eliquis             -antiplatelet therapy:  N/A 3. Pain Management: Oxycodone prn             -add gabapentin '100mg'$  TID for phantom limb pain  7/7- will d/c Gabapentin due ot memory issues and add Lyrica 50 mg TID; also started Cymbalta yesterday- no nausea so far-              -discussed massage and desensitization 4. Mood/Sleep: LCSW to follow for evaluation and support.              -antipsychotic agents: N/A 5. Neuropsych/cognition: This patient is capable of making decisions on his own behalf. 6. Skin/Wound Care: Continue wound VAC for 7 days post-op  7/6- VAC to be d/c'd 09/05/21  7/7- wrote order to d/c [facility] tomorrow if not already off.  7. Fluids/Electrolytes/Nutrition: Has been on IVF since admission-->d/c.  --Check CMET in am             --encourage  intake. Will change glucerna to TID with meals to avoid BS spikes             --continue Juven, Vitamin C and Zinc  to promote wound healing.  8. A fib: Monitor HR TID--currently controlled on Cardizem 120 mg BID and metoprolol 50 mg bid             --on Eliquis w/ H/H stable 9. T2DM: Hgb A1c-7.9-- 10.7 in 4 months likely due to infection/now controlled.   -- Mild DKA-->beta hydroxybutyric acid 3.55-->0.91 --will continue to monitor BS ac/hs and titrate meds as indicated.              --Was on Metformin 1000 mg bid at home             --continue Levemir (increased to 15 u BID 07/04) w/ 4 units novolog  ac TID. Titrate as indicated.  7/5- Cbg's 200-300s- just got increase yesterday- will add metformin 500 mg BID back to regimen.   7/6- CBG's look better this AM- 178- with addition of Metformin- will monitor and make changes tomorrow as required  7/7- CBG's- mid to upper 100s- will increase metformin to 850 mg BID and monitor CBG's.  10. Depressed systolic LVF: Started on Sacubitril-valsartan bid on 07/02. -Monitor renal function              -daily weights 11. ABLA/anemia of chronic illness: Monitor H/H with serial checks. 7/6- Hb 11.6- will monitor to monitor- doesn't want iron at this time.  12. Hypomagnesemia: Improved post IV supplement and on oral supplement -likely due to malnutrition. Will recheck in am.   7/5- Mg level is 1.9- will con't to monitor 13. Chronic hyponatremia: Improving. Will recheck Na level in am.    7/5- Na is 134- will monitor for trends. 14. Constipation 7/7- gave sorbitol without a BM- will order soap suds enema today if no BM by 3pm- pt still feels constipated    I spent a total of 39   minutes on total care today- >50% coordination of care- due to education of pt about SLP; d/w pt about Gabapentin vs Lyrica and constipation.    LOS: 3 days A FACE TO FACE EVALUATION WAS PERFORMED  Randall Coleman 09/04/2021, 7:43 AM

## 2021-09-04 NOTE — IPOC Note (Signed)
Overall Plan of Care [facility]) Patient Details Name: Randall Coleman MRN: [id number] DOB: March 13, 1950  Admitting Diagnosis: Below-knee amputation of left lower extremity [facility])  Hospital Problems: Principal Problem:   Below-knee amputation of left lower extremity ([place]) Active Problems:   S/P BKA (below knee amputation), left ([place])     Functional Problem List: Nursing Bowel, Edema, Endurance, Medication Management, Motor, Pain, Safety, Sensory, Skin Integrity  PT Balance, Endurance, Pain, Safety, Sensory, Skin Integrity  OT Balance, Safety, Cognition, Endurance, Motor  SLP    TR         Basic ADL's: OT Grooming, Bathing, Dressing, Toileting     Advanced  ADL's: OT Simple Meal Preparation, Laundry, Light Housekeeping     Transfers: PT Bed Mobility, Bed to Chair, Car, Furniture, Futures trader, Metallurgist: PT Ambulation, Emergency planning/management officer, Stairs     Additional Impairments: OT None  SLP        TR      Anticipated Outcomes Item Anticipated Outcome  Self Feeding set-up assist  Swallowing      Basic self-care  supervision  Toileting  supervision   Bathroom Transfers supervision  Bowel/Bladder  mod I  Transfers  Supervision  Locomotion  Supervision at w/c level  Communication     Cognition     Pain  < 3  Safety/Judgment  mod I at [facility] level   Therapy Plan: PT Intensity: Minimum of 1-2 x/day ,45 to 90 minutes PT Frequency: 5 out of 7 days PT Duration Estimated Length of Stay: 12-14 days OT Intensity: Minimum of 1-2 x/day, 45 to 90 minutes OT Frequency: 5 out of 7 days OT Duration/Estimated Length of Stay: 14 days     Team Interventions: Nursing Interventions Patient/Family Education, Pain Management, Medication Management, Discharge Planning, Bowel Management, Skin Care/Wound Management, Disease Management/Prevention, Psychosocial Support  PT interventions Ambulation/gait training, Training and development officer, Cognitive  remediation/compensation, Community reintegration, Discharge planning, DME/adaptive equipment instruction, Functional mobility training, Neuromuscular re-education, Pain management, Patient/family education, Psychosocial support, Stair training, Therapeutic Activities, Therapeutic Exercise, UE/LE Strength taining/ROM, UE/LE Coordination activities, Wheelchair propulsion/positioning  OT Interventions Discharge planning, Therapeutic Activities, Self Care/advanced ADL retraining, Patient/family education, Functional mobility training, Skin care/wound managment, Therapeutic Exercise, Community reintegration, Engineer, drilling, Psychosocial support, UE/LE Strength taining/ROM, Wheelchair propulsion/positioning  SLP Interventions    TR Interventions    SW/CM Interventions Discharge Planning, Psychosocial Support, Patient/Family Education   Barriers to Discharge MD  Medical stability, Home enviroment access/loayout, Wound care, Lack of/limited family support, Weight, Weight bearing restrictions, and poor memory  Nursing Decreased caregiver support, Home environment access/layout, Incontinence, Wound Care, Lack of/limited family support, Weight bearing restrictions 1 level apartment, level entry. Lives alone. Could discharge to brothers in [place] or sister's in [place]. Sister can provide supervision but no physical assist.  PT Decreased caregiver support, Lack of/limited family support, Weight bearing restrictions    OT Decreased caregiver support Pt lives alone and currently requiring moderate assistance for mobility.  SLP      SW Decreased caregiver support, Lack of/limited family support, Medication compliance     Team Discharge Planning: Destination: PT-Home ,OT- Home (pending progress) , SLP-  Projected Follow-up: PT-Home health PT, 24 hour supervision/assistance, OT-  Home health OT (for home evaluation and set-up), SLP-  Projected Equipment Needs: PT-Rolling walker with 5" wheels,  Wheelchair (measurements), Wheelchair cushion (measurements), OT- Tub/shower bench, 3 in 1 bedside comode, To be determined, SLP-  Equipment Details: [id number] w/c with L amputee limb support, OT-  Patient/family  involved in discharge planning: PT- Patient,  OT-Patient, SLP-   MD ELOS: 12-14 days Medical Rehab Prognosis:  Good Assessment: The patient has been admitted for CIR therapies with the diagnosis of L BKA. The team will be addressing functional mobility, strength, stamina, balance, safety, adaptive techniques and equipment, self-care, bowel and bladder mgt, patient and caregiver education, preprosthetic training. Goals have been set at supervision. Anticipated discharge destination is home.        See Team Conference Notes for weekly updates to the plan of care

## 2021-09-04 NOTE — Progress Notes (Signed)
Physical Therapy Session Note  Patient Details  Name: Randall Coleman MRN: [id number] Date of Birth: 18-Jul-1950  Today's Date: 09/04/2021 PT Individual Time: 0805-0902 PT Individual Time Calculation (min): 57 min   Short Term Goals: Week 1:  PT Short Term Goal 1 (Week 1): Pt will perform least restrictive transfer with min A consistently PT Short Term Goal 2 (Week 1): Pt will maintain standing balance x 5 min while completing functional task with min A PT Short Term Goal 3 (Week 1): Pt will initiate gait training  Skilled Therapeutic Interventions/Progress Updates: Pt presented in bed agreeable to therapy. Pt c/o pain in residual limb vs phantom pain, premedicated with rest breaks provided as needed. Pt with limb guard already on, performed supine in sit EOB with supervision with use of bed features. Pt donned shoe mod I and perofrmed squat pivot transfer to w/c. Pt propelled w/c to day room for general conditioning and x 2 brief rest breaks. Performed squat pivot transfer to high/low mat with CGA. Performed Sit to stand 2 x 5 with RW and CGA for RLE strengthening. Pt then participated in standing therex performing hip flexion and hip abd 2 x 10 in standing. Pt indicated increase  in pain in residual limb, discussed desensitization techniques with pt transferring to long sit and removed limb guard with min verbal cues. Pt performed light massage, discussed placement of wound vac and why pt does not feel distal portion of residual limb as much. Pt also performed SLR, hip abd/add and LAQ to fatigue while on mat. Pt was able to don limb guard with min cues and performed squat pivot to w/c with CGA. Pt propelled back to room with supervision and without rest breaks and pt left in w/c at end of session with call bell within reach and NT present.      Therapy Documentation Precautions:  Precautions Precautions: Fall Precaution Comments: wound vac Required Braces or Orthoses: Other Brace Other Brace: L  limb protector Restrictions Weight Bearing Restrictions: Yes LLE Weight Bearing: Non weight bearing General:   Vital Signs:   Pain: Pain Assessment Pain Scale: 0-10 Pain Score: 7  Pain Type: Acute pain Pain Location: Leg Pain Orientation: Left Mobility:   Locomotion :    Trunk/Postural Assessment :    Balance:   Exercises:   Other Treatments:      Therapy/Group: Individual Therapy  Quinnetta Roepke 09/04/2021, 12:49 PM

## 2021-09-04 NOTE — Progress Notes (Signed)
Occupational Therapy Session Note  Patient Details  Name: Randall Coleman MRN: [id number] Date of Birth: Sep 15, 1950  Today's Date: 09/04/2021 OT Individual Time: 1000-1115 & 1415-1530 OT Individual Time Calculation (min): 75 min & 75 min   Short Term Goals: Week 1:  OT Short Term Goal 1 (Week 1): Pt will complete toileting with minimal assistance. OT Short Term Goal 2 (Week 1): Pt wil complete UB dressing with set-up assistance. OT Short Term Goal 3 (Week 1): Pt will complete LB dressing with minimal assistance using LRAD and AE as needed. OT Short Term Goal 4 (Week 1): Pt will complete bathing tasks with supervison using AE and DME. OT Short Term Goal 5 (Week 1): Pt will complete toilet transfer with minimal assistance using LRAD.  Skilled Therapeutic Interventions/Progress Updates:    Session 1: Upon OT arrival, pt seated in w/c. Reports no pain and is agreeable to OT treatment session. Treatment intervention with a focus on self care retraining, strengthening, endurance, and w/c mobility. Pt completes sponge bath ADL at the levels below. STM deficits noted during ADL requiring verbal cues to redirect and remind pt of previous instructions or items found in suitcase. Pt tolerates ADL tasks well. Pt propels himself to dayroom therapy gym from his room via w/c with Supervision. While seated at tabletop, pt replicates 3 graphic designs of PVC pipes using B UE and 1.5lb wrist weights to improve strength and endurance for functional transfers. Pt with Min difficulty to replicate second design. Increased time required to complete all 3 designs but pt completes with good attention to task. RN requesting weight for pt. Pt completes sit to stand transfer onto scale with Min A and weighs 186.4. Pt completes stand to sit transfer with Min A. Pt propels himself to his room via w/c with Supervision and was left in his w/c at end of session with all needs met.   Session 2: Upon OT arrival, pt supine in bed  requesting to go to the bathroom. Pt reports 7/10 phantom pain to L LE. Pt agreeable to OT treatment session. Pt completes supine to sit transfer with SBA and stand pivot transfer to w/c with Min A. Pt with decreased safety awareness and requires education to perform tasks slowly to reduce risk for falls. Pt was transported to bathroom with Min A and performs toilet transfer with Min A, toileting with Min A and hand hygiene with supervision. Pt propels himself to main gym with supervision. Pt completes standing tasks at tabletop with 1 UE support for balance. Pt sorts playing cards onto velcro board and also places graded clothespins onto horizontal rods using B UE. Pt able to perform 85% of tasks while standing and requires to sit for remainder of task secondary to fatigue in R LE. Pt requires Mod A to catch patient secondary to LOB. Pt requires education regarding standing tolerance and fatigue and signs to be aware to reduce falls and pt verbalizes good understanding. While seated in w/c pt completes B UE exercises using 2lb wrist weight to improve strength and endurance during transfers. Pt performs 2x10 reps of shoulder flex/ext, shoulder abd/add, chest press, overhead press, elbow flex/ext, supination/pronation, wrist flex/ext. Pt requires no rest breaks during exercises but rest break between sets. Pt propels himself to his room via w/c with supervision and completes stand pivot transfer back into bed with Min A. Pt performs sit to supine transfer with SBA. Pt left in bed with all needs met and safety measures in place. Pt limited primarily  by decreased standing balance, tolerance, and safety awareness and continues to benefit from OT servicies to achieve highest level of independence.   Therapy Documentation Precautions:  Precautions Precautions: Fall Precaution Comments: wound vac Required Braces or Orthoses: Other Brace Other Brace: L limb protector Restrictions Weight Bearing Restrictions:  Yes LLE Weight Bearing: Non weight bearing   ADL: Grooming: Supervision/safety Where Assessed-Grooming: Sitting at sink Upper Body Bathing: Supervision/safety Where Assessed-Upper Body Bathing: Sitting at sink Lower Body Bathing: Minimal assistance (for balance) Where Assessed-Lower Body Bathing: Standing at sink Upper Body Dressing: Supervision/safety Where Assessed-Upper Body Dressing: Sitting at sink Lower Body Dressing: Minimal assistance Where Assessed-Lower Body Dressing: Standing at sink, Sitting at sink ADL Comments: sponge bath  Therapy/Group: Individual Therapy  Marvetta Gibbons 09/04/2021, 10:55 AM

## 2021-09-04 NOTE — Discharge Instructions (Addendum)
Inpatient Rehab Discharge Instructions  Randall Coleman Discharge date and time:    Activities/Precautions/ Functional Status: Activity: no lifting, driving, or strenuous exercise for till cleared by MD Diet: diabetic diet Wound Care: keep wound clean and dry   Functional status:  ___ No restrictions     ___ Walk up steps independently _X__ 24/7 supervision/assistance   ___ Walk up steps with assistance ___ Intermittent supervision/assistance  ___ Bathe/dress independently ___ Walk with walker     _X__ Bathe/dress with assistance ___ Walk Independently    ___ Shower independently ___ Walk with assistance    ___ Shower with assistance _X__ No alcohol     ___ Return to work/school ________   COMMUNITY REFERRALS UPON DISCHARGE:   Please use home exercise program provided until able outpatient therapies begin once appropriate for prosthesis.   Medical Equipment/Items Ordered:wheelchair, rolling walker, tub transfer bench, and drop arm bedside                                                 Agency/Supplier: ordered through [facility] PATIENT/FAMILY: A travelling veteran referral request was Randall Coleman to also be connected with amputee clinic and physiatry at the local [place] in [place] and PCP. Be sure to contact Randall Coleman office [phone] to check the status of the referral by asking for his PCP or assigned social worker.   Special Instructions:    My questions have been answered and I understand these instructions. I will adhere to these goals and the provided educational materials after my discharge from the hospital.  Patient/Caregiver Signature _______________________________ Date __________  Clinician Signature _______________________________________ Date __________  Please bring this form and your medication list with you to all your follow-up doctor's appointments.

## 2021-09-05 LAB — GLUCOSE, CAPILLARY
Glucose-Capillary: 176 mg/dL — ABNORMAL HIGH (ref 70–99)
Glucose-Capillary: 205 mg/dL — ABNORMAL HIGH (ref 70–99)
Glucose-Capillary: 179 mg/dL — ABNORMAL HIGH (ref 70–99)
Glucose-Capillary: 132 mg/dL — ABNORMAL HIGH (ref 70–99)

## 2021-09-05 NOTE — Progress Notes (Signed)
Occupational Therapy Session Note  Patient Details  Name: Randall Coleman MRN: [id number] Date of Birth: 12/27/1950  Today's Date: 09/05/2021 OT Individual Time: 1120-1200 OT Individual Time Calculation (min): 40 min    Short Term Goals: Week 1:  OT Short Term Goal 1 (Week 1): Pt will complete toileting with minimal assistance. OT Short Term Goal 2 (Week 1): Pt wil complete UB dressing with set-up assistance. OT Short Term Goal 3 (Week 1): Pt will complete LB dressing with minimal assistance using LRAD and AE as needed. OT Short Term Goal 4 (Week 1): Pt will complete bathing tasks with supervison using AE and DME. OT Short Term Goal 5 (Week 1): Pt will complete toilet transfer with minimal assistance using LRAD.  Skilled Therapeutic Interventions/Progress Updates:    Pt received semi-reclined in bed, agreeable to therapy. Session focus on self-care retraining, activity tolerance, transfer retraining, LLE therex in prep for improved ADL/IADL/func mobility performance + decreased caregiver burden.  Utilized urinal mod I bed level. Came to sitting EOB close S. L limb guard already donned. Squat-pivot throughout session with CGA and set-up A of w/c, oriented to w/c parts management throughout.   Pt self-propelled w/c to and from Day Room distant S and increased time to target BUE strengthening.   In R side lying pt completed 2x10 hip extension, forward/backward circles, hip abduction with L residual limb to prevent risk of hip contracture.  Partial stand-pivot with use of grab bars to and from toilet CGA, CGA during clothing management and close S for seated pericare post continent void of BM.   Pt left semi-reclined in bed with NT present with bed alarm engaged, call bell in reach, and all immediate needs met.    Therapy Documentation Precautions:  Precautions Precautions: Fall Precaution Comments: wound vac Required Braces or Orthoses: Other Brace Other Brace: L limb  protector Restrictions Weight Bearing Restrictions: Yes LLE Weight Bearing: Non weight bearing  Pain: phantom limb pain, reports being premedicated, did not rate  ADL: See Care Tool for more details.   Therapy/Group: Individual Therapy  Volanda Napoleon MS, OTR/L   09/05/2021, 6:55 AM

## 2021-09-05 NOTE — Progress Notes (Signed)
PROGRESS NOTE   Subjective/Complaints:  Patient seen today while working with OT.  Doing a great job with mazes and other activities.  Wound vac to be removed today.  Reports that he still has residual limb pain/phantom pain.  Educated by Dr. Dagoberto Ligas, MD yesterday that this can take some time to begin to see the effects. Last BM yesterday.   ROS:  Pt denies SOB, abd pain, CP, N/V/D, + constipation, - vision changes   Objective:   No results found. No results for input(s): "WBC", "HGB", "HCT", "PLT" in the last 72 hours.  No results for input(s): "NA", "K", "CL", "CO2", "GLUCOSE", "BUN", "CREATININE", "CALCIUM" in the last 72 hours.   Intake/Output Summary (Last 24 hours) at 09/05/2021 1648 Last data filed at 09/05/2021 1330 Gross per 24 hour  Intake 838 ml  Output 2175 ml  Net -1337 ml        Physical Exam: Vital Signs Blood pressure (!) 100/56, pulse 62, temperature 98.2 F (36.8 C), temperature source Oral, resp. rate 20, height '6\' 2"'$  (1.88 m), weight 84.6 kg, SpO2 98 %.     General: awake, alert, appropriate, sitting up in bed; just woke up- ate 100% tray; NAD HENT: conjugate gaze; oropharynx moist CV: regular rate; no JVD Pulmonary: CTA B/L; no W/R/R- good air movement GI: soft, NT, ND, (+)BS Psychiatric: appropriate- but oculdn't remember therapy yesterday? Neurological: alert Musculoskeletal:     Comments: Left BK stump bulbous.tender -wearing shrinker and limb guard Skin:    General: Skin is warm and dry.     Comments: Vac dressing in place. To be removed by nursing staff today. Onychomycosis of right toenails.   Neurological:     Mental Status: He is alert and oriented to person, place, and time.     Cranial Nerves: No cranial nerve deficit.     Comments: Oriented to self and situation--place-still need cues for month. CN exam intact. UE motor 5/5. RLE 4/5. LLE 3/5 HF, KE/KF. Decreased LT distal RLE  below ankles   Assessment/Plan: 1. Functional deficits which require 3+ hours per day of interdisciplinary therapy in a comprehensive inpatient rehab setting. Physiatrist is providing close team supervision and 24 hour management of active medical problems listed below. Physiatrist and rehab team continue to assess barriers to discharge/monitor patient progress toward functional and medical goals  Care Tool:  Bathing    Body parts bathed by patient: Right arm, Left arm, Chest, Abdomen, Front perineal area, Right upper leg, Left upper leg, Face, Right lower leg   Body parts bathed by helper: Right lower leg Body parts n/a: Left lower leg   Bathing assist Assist Level: Minimal Assistance - Patient > 75%     Upper Body Dressing/Undressing Upper body dressing   What is the patient wearing?: Pull over shirt    Upper body assist Assist Level: Supervision/Verbal cueing    Lower Body Dressing/Undressing Lower body dressing      What is the patient wearing?: Pants     Lower body assist Assist for lower body dressing: Minimal Assistance - Patient > 75%     Toileting Toileting    Toileting assist Assist for toileting: Contact Guard/Touching assist  Transfers Chair/bed transfer  Transfers assist     Chair/bed transfer assist level: Supervision/Verbal cueing     Locomotion Ambulation   Ambulation assist   Ambulation activity did not occur: Safety/medical concerns  Assist level: Moderate Assistance - Patient 50 - 74% Assistive device: Walker-rolling Max distance: 5'   Walk 10 feet activity   Assist  Walk 10 feet activity did not occur: Safety/medical concerns        Walk 50 feet activity   Assist Walk 50 feet with 2 turns activity did not occur: Safety/medical concerns         Walk 150 feet activity   Assist Walk 150 feet activity did not occur: Safety/medical concerns         Walk 10 feet on uneven surface  activity   Assist Walk 10  feet on uneven surfaces activity did not occur: Safety/medical concerns         Wheelchair     Assist Is the patient using a wheelchair?: Yes Type of Wheelchair: Manual Wheelchair activity did not occur: Refused  Wheelchair assist level: Supervision/Verbal cueing Max wheelchair distance: 150'    Wheelchair 50 feet with 2 turns activity    Assist    Wheelchair 50 feet with 2 turns activity did not occur: Refused   Assist Level: Supervision/Verbal cueing   Wheelchair 150 feet activity     Assist  Wheelchair 150 feet activity did not occur: Refused   Assist Level: Supervision/Verbal cueing   Blood pressure (!) 100/56, pulse 62, temperature 98.2 F (36.8 C), temperature source Oral, resp. rate 20, height '6\' 2"'$  (1.88 m), weight 84.6 kg, SpO2 98 %.  Medical Problem List and Plan: 1. 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  2.  Antithrombotics: -DVT/anticoagulation:  Pharmaceutical: Other (comment)--Eliquis             -antiplatelet therapy:  N/A 3. Pain Management: Oxycodone prn             -add gabapentin '100mg'$  TID for phantom limb pain  7/7- will d/c Gabapentin due ot memory issues and add Lyrica 50 mg TID; also started Cymbalta yesterday- no nausea so far-  7/8 No side effects from Cymbalta, continue Lyrica 50 mg TID             -discussed massage and desensitization 4. Mood/Sleep: LCSW to follow for evaluation and support.              -antipsychotic agents: N/A 5. Neuropsych/cognition: This patient is capable of making decisions on his own behalf. 6. Skin/Wound Care: Continue wound VAC for 7 days post-op  7/6- VAC to be d/c'd 09/05/21  7/7- wrote order to d/c [facility] tomorrow if not already off.  7/8 Wound vac removed this afternoon by  nurse.  Dry dressing with Kerlix. 7. Fluids/Electrolytes/Nutrition: Has been on IVF since admission-->d/c.  --Check CMET in am             --encourage intake. Will change glucerna to TID with meals to avoid BS spikes             --continue Juven, Vitamin C and Zinc to promote wound  healing.  8. A fib: Monitor HR TID--currently controlled on Cardizem 120 mg BID and metoprolol 50 mg bid             --on Eliquis w/ H/H stable 9. T2DM: Hgb A1c-7.9-- 10.7 in 4 months likely due to infection/now controlled.   -- Mild DKA-->beta hydroxybutyric acid 3.55-->0.91 --will continue to monitor BS ac/hs and titrate meds as indicated.              --Was on Metformin 1000 mg bid at home             --continue Levemir (increased to 15 u BID 07/04) w/ 4 units novolog  ac TID. Titrate as indicated.  7/5- Cbg's 200-300s- just got increase yesterday- will add metformin 500 mg BID back to regimen.   7/6- CBG's look better this AM- 178- with addition of Metformin- will monitor and make changes tomorrow as required  7/7- CBG's- mid to upper 100s- will increase metformin to 850 mg BID and monitor CBG's.  7/8 Continue to monitor as change to metformin dose still new.  CBG (last 3)  Recent Labs    09/05/21 0629 09/05/21 1201 09/05/21 1638  GLUCAP 176* 205* 179*    10. Depressed systolic LVF: Started on Sacubitril-valsartan bid on 07/02. -Monitor renal function              -daily weights 11. ABLA/anemia of chronic illness: Monitor H/H with serial checks. 7/6- Hb 11.6- will monitor to monitor- doesn't want iron at this time.  12. Hypomagnesemia: Improved post IV supplement and on oral supplement -likely due to malnutrition. Will recheck in am.   7/5- Mg level is 1.9- will con't to monitor 13. Chronic hyponatremia: Improving. Will recheck Na level in am.    7/5- Na is 134- will monitor for trends. 14. Constipation 7/7- gave sorbitol without a BM- will order soap suds enema today if no BM by 3pm- pt still feels  constipated  7/8 Had BM on 7/7, continue to monitor.   LOS: 4 days A FACE TO FACE EVALUATION WAS PERFORMED  Luetta Nutting 09/05/2021, 4:48 PM

## 2021-09-05 NOTE — Plan of Care (Signed)
  Problem: Consults Goal: RH LIMB LOSS PATIENT EDUCATION Description: Description: See Patient Education module for eduction specifics. Outcome: Progressing Goal: Skin Care Protocol Initiated - if Braden Score 18 or less Description: If consults are not indicated, leave blank or document N/A Outcome: Progressing   Problem: RH BOWEL ELIMINATION Goal: RH STG MANAGE BOWEL WITH ASSISTANCE Description: STG Manage Bowel with Mod I Assistance. Outcome: Progressing Goal: RH STG MANAGE BOWEL W/MEDICATION W/ASSISTANCE Description: STG Manage Bowel with Medication with Mod I Assistance. Outcome: Progressing   Problem: RH SKIN INTEGRITY Goal: RH STG MAINTAIN SKIN INTEGRITY WITH ASSISTANCE Description: STG Maintain Skin Integrity With Mod I Assistance. Outcome: Progressing Goal: RH STG ABLE TO PERFORM INCISION/WOUND CARE W/ASSISTANCE Description: STG Able To Perform Incision/Wound Care With Mod I Assistance. Outcome: Progressing   Problem: RH SAFETY Goal: RH STG ADHERE TO SAFETY PRECAUTIONS W/ASSISTANCE/DEVICE Description: STG Adhere to Safety Precautions With Cues and Reminders. Outcome: Progressing Goal: RH STG DECREASED RISK OF FALL WITH ASSISTANCE Description: STG Decreased Risk of Fall With Mod I Assistance. Outcome: Progressing   Problem: RH PAIN MANAGEMENT Goal: RH STG PAIN MANAGED AT OR BELOW PT'S PAIN GOAL Description: < 3 on a 0-10 pain scale. Outcome: Progressing   Problem: RH KNOWLEDGE DEFICIT LIMB LOSS Goal: RH STG INCREASE KNOWLEDGE OF SELF CARE AFTER LIMB LOSS Description: Patient will demonstrate knowledge of self-care management, medication/pain management, skin/wound care, weight bearing precautions with educational materials and handouts provided by staff independently at discharge. Outcome: Progressing

## 2021-09-05 NOTE — Evaluation (Signed)
Speech Language Pathology Assessment and Plan  Patient Details  Name: Randall Coleman MRN: [id number] Date of Birth: 1950/04/20  SLP Diagnosis:   N/A Rehab Potential:   N/A ELOS:   N/A   Today's Date: 09/05/2021 SLP Individual Time: 8338-2505 SLP Individual Time Calculation (min): 34 min   Hospital Problem: Principal Problem:   Below-knee amputation of left lower extremity ([place]) Active Problems:   S/P BKA (below knee amputation), left ([place])  Past Medical History:  Past Medical History:  Diagnosis Date   Atrial fibrillation ([place])    Diabetes mellitus without complication ()    DVT (deep venous thrombosis) ([place])    Hypertension    Past Surgical History:  Past Surgical History:  Procedure Laterality Date   AMPUTATION Left 07/30/2020   Procedure: AMPUTATION LEFT GREAT TOE;  Surgeon: [name], MD;  Location: [facility];  Service: Orthopedics;  Laterality: Left;   AMPUTATION Left 04/08/2021   Procedure: AMPUTATION LEFT 2ND TOE;  Surgeon: [name], MD;  Location: [facility];  Service: Orthopedics;  Laterality: Left;   AMPUTATION Left 08/26/2021   Procedure: Left Partial Foot Amputation;  Surgeon: [name], MD;  Location: [facility];  Service: Orthopedics;  Laterality: Left;   AMPUTATION Left 08/29/2021   Procedure: LEFT BELOW KNEE AMPUTATION;  Surgeon: [name], MD;  Location: [facility];  Service: Orthopedics;  Laterality: Left;   HERNIA REPAIR     HERNIA REPAIR     IR CHEST FLUORO  12/13/2018    Assessment / Plan / Recommendation Clinical Impression   Patient is a 71 year old male who arrived at emergency department with concerns of L foot infection. PMH includes: type 2 DM, HTN and DVT on eliquis.  Patient's white blood cell count  was 14, A1c 10.7, albumin 2.7 on admission. Patient also with Afib with RVR. Responded poorly to IV diltiazem until IV metoprolol was added. Briefly required cardizem and heparin.  Patient found to have necrotic abscess of left foot. Patient is s/p  great toe and second toe amputation prior to this admission. On 6/28 patient underwent transmetatarsal amputation per Dr. Sharol Given. Post op, abscess and necrotic tissue found to be extending to the hindfoot and on 08/29/21 patient underwent left below the knee amputation per Dr. Sharol Given. Therapies consulted and recommend CIR.      Pt presents with cognitive skills at baseline function with, mild deficits in delayed processing and memory further impacted by reduced motivation. SLP administered cognitive linguistic assessment CLQT pt scored WFL in attention, executive function and recall. Pt demonstrated occasional delayed processing for example, SLP gave pt hand sanitizer and pt stated " now what do you want me to do with this?" Pt supports cognitive skills are at baseline and lead a routine lifestyle. Pt was able to recall novel medications and familiar medication set up for organization at home mod I. Pt supports no acute deficits in speech function. Oral motor skills are [facility] and pt demonstrated appropriate mastication (slightly slow), oral clearance, timely swallow and no overt s/s aspiration of thin liquids and regular textures. Pt supports desire for softer foods due to edentulous status and SLP adjusted diet to dys 3 textures, but there is no dysphagia noted. No further ST services are needed at this time due to pt supporting baseline function, WFL on assessment, verbal review of medication completed and pt expressed no desire to participate in ST services.   Skilled Therapeutic Interventions  Skilled ST services focused on cognitive skills. SLP facilitated administration of cognitive linguistic formal assessment and provided education of results. All questions answered to satisfaction.  Pt was left in room with call bell within reach and bed alarm set.    SLP Assessment  Patient does not need any further Speech Lanaguage Pathology Services    Recommendations  SLP Diet Recommendations: Dysphagia 3  (Mech soft);Thin Liquid Administration via: Cup;Straw Medication Administration: Whole meds with liquid Supervision: Patient able to self feed Compensations: Minimize environmental distractions;Slow rate;Small sips/bites Postural Changes and/or Swallow Maneuvers: Seated upright 90 degrees Oral Care Recommendations: Oral care BID Recommendations for Other Services: Neuropsych consult Patient destination: Home Follow up Recommendations: 24 hour supervision/assistance Equipment Recommended: None recommended by SLP    SLP Frequency   N/A  SLP Duration  SLP Intensity  SLP Treatment/Interventions   N/A   N/A    N/A   Pain Pain Assessment Pain Score: 0-No pain  Prior Functioning Cognitive/Linguistic Baseline: Baseline deficits Baseline deficit details: age appropriate memory deficits Type of Home: Apartment  Lives With: Alone Available Help at Discharge: Other (Comment) Vocation: On disability  SLP Evaluation Cognition Overall Cognitive Status: History of cognitive impairments - at baseline Arousal/Alertness: Awake/alert Orientation Level: Oriented X4 Attention: Sustained;Selective Sustained Attention: Appears intact Selective Attention: Appears intact Memory: Impaired Memory Impairment: Decreased recall of new information (further impacted by moitavtion) Awareness: Impaired Awareness Impairment: Anticipatory impairment Problem Solving: Appears intact Safety/Judgment: Impaired Comments: supports baseline cognitive and not interested in ST services  Comprehension Auditory Comprehension Overall Auditory Comprehension: Impaired Yes/No Questions: Within Functional Limits Commands: Impaired (occassional confusion with directions) Expression Expression Primary Mode of Expression: Verbal Verbal Expression Overall Verbal Expression: Appears within functional limits for tasks assessed Oral Motor Oral Motor/Sensory Function Overall Oral Motor/Sensory Function: Within  functional limits Motor Speech Overall Motor Speech: Appears within functional limits for tasks assessed  Care Tool Care Tool Cognition Ability to hear (with hearing aid or hearing appliances if normally used Ability to hear (with hearing aid or hearing appliances if normally used): 0. Adequate - no difficulty in normal conservation, social interaction, listening to TV   Expression of Ideas and Wants Expression of Ideas and Wants: 4. Without difficulty (complex and basic) - expresses complex messages without difficulty and with speech that is clear and easy to understand   Understanding Verbal and Non-Verbal Content Understanding Verbal and Non-Verbal Content: 3. Usually understands - understands most conversations, but misses some part/intent of message. Requires cues at times to understand  Memory/Recall Ability Memory/Recall Ability : Current season;That he or she is in a hospital/hospital unit   PMSV Assessment  PMSV Trial    Bedside Swallowing Assessment General Diet Prior to this Study: Dysphagia 3 (soft);Thin liquids Behavior/Cognition: Alert;Cooperative Oral Cavity - Dentition: Edentulous Self-Feeding Abilities: Able to feed self Patient Positioning: Upright in bed Baseline Vocal Quality: Normal Volitional Cough: Strong Volitional Swallow: Able to elicit  Oral Care Assessment Oral Assessment  (WDL): Within Defined Limits Lips: Symmetrical Teeth: Missing (Comment) Tongue: Pink;Moist Mucous Membrane(s): Moist;Pink Saliva: Moist, saliva free flowing Level of Consciousness: Alert Is patient on any of following O2 devices?: None of the above Nutritional status: No high risk factors Oral Assessment Risk : Low Risk Ice Chips Ice chips: Not tested Thin Liquid Thin Liquid: Within functional limits Nectar Thick Nectar Thick Liquid: Not tested Honey Thick Honey Thick Liquid: Not tested Puree Puree: Not tested Solid Solid: Within functional limits BSE Assessment Risk  for Aspiration Impact on safety and function:  No limitations  Short Term Goals: No short term goals set  Refer to Care Plan for Long Term Goals  Recommendations for other services: Neuropsych  Discharge Criteria: Patient will be discharged from SLP if patient refuses treatment 3 consecutive times without medical reason, if treatment goals not met, if there is a change in medical status, if patient makes no progress towards goals or if patient is discharged from hospital.  The above assessment, treatment plan, treatment alternatives and goals were discussed and mutually agreed upon: by patient  Shuan Statzer  [facility] 09/05/2021, 12:44 PM

## 2021-09-05 NOTE — Progress Notes (Signed)
Wound vac removed as ordered. MD called due to no orders for dressing once removed. MD ordered dry dressing and kerlex be placed after wound vac removal. Wound vac chamber empty with no drainage noted. Incision with approximated edges and staples in place.No drainage present upon removal Patient was given prn pain meds during removal. Patient in bed with LLE BKA elevated. Sanda Linger, RN

## 2021-09-06 LAB — GLUCOSE, CAPILLARY
Glucose-Capillary: 161 mg/dL — ABNORMAL HIGH (ref 70–99)
Glucose-Capillary: 137 mg/dL — ABNORMAL HIGH (ref 70–99)
Glucose-Capillary: 98 mg/dL (ref 70–99)
Glucose-Capillary: 115 mg/dL — ABNORMAL HIGH (ref 70–99)

## 2021-09-06 MED ORDER — DILTIAZEM HCL ER 90 MG PO CP12
90.0000 mg | ORAL_CAPSULE | Freq: Two times a day (BID) | ORAL | Status: DC
  Administered 2021-09-06 – 2021-09-15 (×18): 90 mg via ORAL
  Filled 2021-09-06 (×19): qty 1

## 2021-09-06 NOTE — Progress Notes (Addendum)
Physical Therapy Session Note  Patient Details  Name: Antolin Belsito MRN: [id number] Date of Birth: April 17, 1950  Today's Date: 09/06/2021 PT Individual Time: 6314-9702; 1125-1205, 1420-1520 PT Individual Time Calculation (min): 44 min, 40 min, 60 min  Short Term Goals: Week 1:  PT Short Term Goal 1 (Week 1): Pt will perform least restrictive transfer with min A consistently PT Short Term Goal 2 (Week 1): Pt will maintain standing balance x 5 min while completing functional task with min A PT Short Term Goal 3 (Week 1): Pt will initiate gait training  Skilled Therapeutic Interventions/Progress Updates:    Tx 1:  pt received resting in bed.  He reported pain 7/10  residual limb; premedicated.  Rest breaks and re-positioning offered throughout session to address pain.  Shrinker sock in place.  In flat bed, no rails, supine> sit with supervision.  Pt attempted to use urinal to void, but had no results.  With set-up, pt donned R shoe.  He donned L limb protector with max cues.  Squat pivot transfer to the L with CGA.    Therapeutic exercise performed with RLE to increase strength for functional mobility: seated: 30 x 1 R heel raises, R short arc knee extension with ankle DF at end range.  Use of Kinetron with RLE only, at level 30 cm/sec, x 25 cycles x 2, addressing quadriceps and gluteal strengthening; PT providing resistance on opposite foot plate.   Wc propulsion on level tile, x 250' with supervision, iwht cues for efficiency and turns.  Pt managed R and L legrests with min assist.  At end of session, pt resting in wc with needs at hand and seat belt alarm connected.  It would not alarm; PT notified NT.  Tx 2:  Pt received sitting in wc.  He rated pain of residual limb 7/10 and also reported phantom ankle pain.  Rest breaks and re-positioning offered throughout session to address pain.  Pt doffed limb protector with mod cues. PT educated pt on use of desensitization and demo'd to him.  Pt  rubbed and tapped residual limb x 2 minutes.  Sit>< stand in parallel bars with CGA.   Therapeutic exercise performed with LE to increase strength for functional mobility. Seated: 15 x 1 L knee flexion/extension Standing: 15 x 1 isolated L hip extension with flexed knee, L hip abduction with flexed knee, R limited excursion knee flexion/extension.  Pt required seated rest break between q exercise.  He reported phantom sensation of L knee buckling during Q exercise.   Gait training in parallel bars , focusing on elevating entire body to step, rather than hopping.  Gait x 6 steps forward/backward with max cues and CGA.  Stand> sit with min assist for controlled descent.  Pt donned limb protector with max cues.   At end of session, pt seated in wc with needs at hand and seat belt alarm set.  Tx 3:  Pt resting in bed.  He reported pain L residual limb 7/10, phantom pain in LL and foot.  He did not remember if he had had meds since AM, RN later confirmed that he had had meds at lunch.  Rest breaks and re-positioning offered throughout session to address pain. PT doffed limb protector.  Supine> side lying > prone > sit with supervision and extra time.  Therapeutic exercises performed with LEs and trunk to increase strength for functional mobility.  In supine: 15 x 1 cervical flexion, R/L straight leg raises.  In R/L side lying :  12 x 1 L/R hip abduction with flexed hips and knees .  In prone: 15 x 1 L/R knee flexion, isolated L hip extension with flexed knee, bil glut sets.  Pt donned limb protector with mod cues.  PT wrote 4 steps for this process in pt's spiral notebook.  Squat pivot to L, bed> wc, CGa and mod cues.  Sit> stand with CGA to RW. Gait training on level tile with RW, x 22' with CGa and wc follow by this PT for pt confidence.    At end of session, pt seated in wc with needs at hand and seat belt alarm set.      Therapy Documentation Precautions:  Precautions Precautions:  Fall Precaution Comments: wound vac Required Braces or Orthoses: Other Brace Other Brace: L limb protector Restrictions Weight Bearing Restrictions: Yes LLE Weight Bearing: Non weight bearing       Therapy/Group: Individual Therapy  Allysia Ingles 09/06/2021, 10:23 AM

## 2021-09-06 NOTE — Plan of Care (Signed)
  Problem: Consults Goal: RH LIMB LOSS PATIENT EDUCATION Description: Description: See Patient Education module for eduction specifics. Outcome: Progressing Goal: Skin Care Protocol Initiated - if Braden Score 18 or less Description: If consults are not indicated, leave blank or document N/A Outcome: Progressing   Problem: RH BOWEL ELIMINATION Goal: RH STG MANAGE BOWEL WITH ASSISTANCE Description: STG Manage Bowel with Mod I Assistance. Outcome: Progressing Goal: RH STG MANAGE BOWEL W/MEDICATION W/ASSISTANCE Description: STG Manage Bowel with Medication with Mod I Assistance. Outcome: Progressing   Problem: RH SKIN INTEGRITY Goal: RH STG MAINTAIN SKIN INTEGRITY WITH ASSISTANCE Description: STG Maintain Skin Integrity With Mod I Assistance. Outcome: Progressing Goal: RH STG ABLE TO PERFORM INCISION/WOUND CARE W/ASSISTANCE Description: STG Able To Perform Incision/Wound Care With Mod I Assistance. Outcome: Progressing   Problem: RH SAFETY Goal: RH STG ADHERE TO SAFETY PRECAUTIONS W/ASSISTANCE/DEVICE Description: STG Adhere to Safety Precautions With Cues and Reminders. Outcome: Progressing Goal: RH STG DECREASED RISK OF FALL WITH ASSISTANCE Description: STG Decreased Risk of Fall With Mod I Assistance. Outcome: Progressing   Problem: RH PAIN MANAGEMENT Goal: RH STG PAIN MANAGED AT OR BELOW PT'S PAIN GOAL Description: < 3 on a 0-10 pain scale. Outcome: Progressing   Problem: RH KNOWLEDGE DEFICIT LIMB LOSS Goal: RH STG INCREASE KNOWLEDGE OF SELF CARE AFTER LIMB LOSS Description: Patient will demonstrate knowledge of self-care management, medication/pain management, skin/wound care, weight bearing precautions with educational materials and handouts provided by staff independently at discharge. Outcome: Progressing

## 2021-09-06 NOTE — Progress Notes (Signed)
PROGRESS NOTE   Subjective/Complaints:  Wound vac removed yesterday by nursing.  Per nursing, surgical site looks good, no drainage, CDI.  Still has phantom leg pain rated at 4/10 today.   ROS:Pt denies SOB, abd pain, CP, N/V/D, + constipation/improving, + phantom leg pain, - vision changes   Objective:   No results found. No results for input(s): "WBC", "HGB", "HCT", "PLT" in the last 72 hours.  No results for input(s): "NA", "K", "CL", "CO2", "GLUCOSE", "BUN", "CREATININE", "CALCIUM" in the last 72 hours.   Intake/Output Summary (Last 24 hours) at 09/06/2021 1245 Last data filed at 09/06/2021 0851 Gross per 24 hour  Intake 480 ml  Output 1475 ml  Net -995 ml        Physical Exam: Vital Signs Blood pressure (!) 102/59, pulse (!) 56, temperature 98.2 F (36.8 C), temperature source Oral, resp. rate 16, height '6\' 2"'$  (1.88 m), weight 84.6 kg, SpO2 99 %.     General: awake, alert, appropriate, sitting up in bed; just woke up- ate 100% tray; NAD HENT: conjugate gaze; oropharynx moist CV: regular rate; no JVD Pulmonary: CTA B/L; no W/R/R- good air movement GI: soft, NT, ND, (+)BS Psychiatric: appropriate- but oculdn't remember therapy yesterday? Neurological: alert Musculoskeletal:     Comments: Left BK stump bulbous.tender -wearing shrinker and limb guard Skin:    General: Skin is warm and dry.     Comments: Wound vac removed 7/8, staples present at incision site, CDI.  Dry dressing with Kerlix present. Onychomycosis of right toenails.   Neurological:     Mental Status: He is alert and oriented to person, place, and time.     Cranial Nerves: No cranial nerve deficit.     Comments: Oriented to self and situation--place-still need cues for month. CN exam intact. UE motor 5/5. RLE 4/5. LLE 3/5 HF, KE/KF. Decreased LT distal RLE below ankles   Assessment/Plan: 1. Functional deficits which require 3+ hours per day of  interdisciplinary therapy in a comprehensive inpatient rehab setting. Physiatrist is providing close team supervision and 24 hour management of active medical problems listed below. Physiatrist and rehab team continue to assess barriers to discharge/monitor patient progress toward functional and medical goals  Care Tool:  Bathing    Body parts bathed by patient: Right arm, Left arm, Chest, Abdomen, Front perineal area, Right upper leg, Left upper leg, Face, Right lower leg   Body parts bathed by helper: Right lower leg Body parts n/a: Left lower leg   Bathing assist Assist Level: Minimal Assistance - Patient > 75%     Upper Body Dressing/Undressing Upper body dressing   What is the patient wearing?: Pull over shirt    Upper body assist Assist Level: Supervision/Verbal cueing    Lower Body Dressing/Undressing Lower body dressing      What is the patient wearing?: Pants     Lower body assist Assist for lower body dressing: Minimal Assistance - Patient > 75%     Toileting Toileting    Toileting assist Assist for toileting: Contact Guard/Touching assist     Transfers Chair/bed transfer  Transfers assist     Chair/bed transfer assist level: Contact Guard/Touching assist  Locomotion Ambulation   Ambulation assist   Ambulation activity did not occur: Safety/medical concerns  Assist level: Moderate Assistance - Patient 50 - 74% Assistive device: Walker-rolling Max distance: 5'   Walk 10 feet activity   Assist  Walk 10 feet activity did not occur: Safety/medical concerns        Walk 50 feet activity   Assist Walk 50 feet with 2 turns activity did not occur: Safety/medical concerns         Walk 150 feet activity   Assist Walk 150 feet activity did not occur: Safety/medical concerns         Walk 10 feet on uneven surface  activity   Assist Walk 10 feet on uneven surfaces activity did not occur: Safety/medical concerns          Wheelchair     Assist Is the patient using a wheelchair?: Yes Type of Wheelchair: Manual Wheelchair activity did not occur: Refused  Wheelchair assist level: Supervision/Verbal cueing Max wheelchair distance: 250    Wheelchair 50 feet with 2 turns activity    Assist    Wheelchair 50 feet with 2 turns activity did not occur: Refused   Assist Level: Supervision/Verbal cueing   Wheelchair 150 feet activity     Assist  Wheelchair 150 feet activity did not occur: Refused   Assist Level: Supervision/Verbal cueing   Blood pressure (!) 102/59, pulse (!) 56, temperature 98.2 F (36.8 C), temperature source Oral, resp. rate 16, height '6\' 2"'$  (1.88 m), weight 84.6 kg, SpO2 99 %.  Medical Problem List and Plan: 1. 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  2.  Antithrombotics: -DVT/anticoagulation:  Pharmaceutical: Other (comment)--Eliquis             -antiplatelet therapy:  N/A 3. Pain Management: Oxycodone prn             -add gabapentin '100mg'$  TID for phantom limb pain  7/7- will d/c Gabapentin due ot memory issues and add Lyrica 50 mg TID; also started Cymbalta yesterday- no nausea so far-  7/9 No side effects from Cymbalta, continue Lyrica 50 mg TID             -discussed massage and desensitization 4. Mood/Sleep: LCSW to follow for evaluation and support.              -antipsychotic agents: N/A 5. Neuropsych/cognition: This patient is capable of making decisions on his own behalf. 6. Skin/Wound Care: Continue wound VAC for 7 days post-op  7/6- VAC to be d/c'd 09/05/21  7/7- wrote order to d/c [facility] tomorrow if not already off.  7/8 Wound vac removed this afternoon by nurse.  7/9 Wound vac removed 7/8, staples present at incision site, CDI.  Dry  dressing with Kerlix present. 7. Fluids/Electrolytes/Nutrition: Has been on IVF since admission-->d/c.  --Check CMET in am             --encourage intake. Will change glucerna to TID with meals to avoid BS spikes             --continue Juven, Vitamin C and Zinc to promote wound healing.  8. A fib: Monitor  HR TID--currently controlled on Cardizem 120 mg BID and metoprolol 50 mg bid             --on Eliquis w/ H/H stable 9. T2DM: Hgb A1c-7.9-- 10.7 in 4 months likely due to infection/now controlled.   -- Mild DKA-->beta hydroxybutyric acid 3.55-->0.91 --will continue to monitor BS ac/hs and titrate meds as indicated.              --Was on Metformin 1000 mg bid at home             --continue Levemir (increased to 15 u BID 07/04) w/ 4 units novolog  ac TID. Titrate as indicated.  7/5- Cbg's 200-300s- just got increase yesterday- will add metformin 500 mg BID back to regimen.   7/6- CBG's look better this AM- 178- with addition of Metformin- will monitor and make changes tomorrow as required  7/7- CBG's- mid to upper 100s- will increase metformin to 850 mg BID and monitor CBG's.  7/8 Continue to monitor as change to metformin dose still new. 7/9 CBG's 132-161 today.  CBG (last 3)  Recent Labs    09/05/21 2026 09/06/21 0536 09/06/21 1217  GLUCAP 132* 161* 137*    10. Depressed systolic LVF: Started on Sacubitril-valsartan bid on 07/02. -Monitor renal function              -daily weights 11. ABLA/anemia of chronic illness: Monitor H/H with serial checks. 7/6- Hb 11.6- will monitor to monitor- doesn't want iron at this time.  12. Hypomagnesemia: Improved post IV supplement and on oral supplement -likely due to malnutrition. Will recheck in am.   7/5- Mg level is 1.9- will con't to monitor 13. Chronic hyponatremia: Improving. Will recheck Na level in am.    7/5- Na is 134- will monitor for trends. 14. Constipation 7/7- gave sorbitol without a BM- will order soap suds enema today if no BM  by 3pm- pt still feels constipated  7/8 Had BM on 7/7, continue to monitor. 7/9 LBM 09/05/21.   LOS: 5 days A FACE TO FACE EVALUATION WAS PERFORMED  Luetta Nutting 09/06/2021, 12:45 PM

## 2021-09-06 NOTE — Progress Notes (Signed)
Occupational Therapy Session Note  Patient Details  Name: Journee Kohen MRN: [id number] Date of Birth: [birth year]  Today's Date: 09/06/2021 OT Individual Time: 4827-0786 OT Individual Time Calculation (min): 45 min    Short Term Goals: Week 1:  OT Short Term Goal 1 (Week 1): Pt will complete toileting with minimal assistance. OT Short Term Goal 2 (Week 1): Pt wil complete UB dressing with set-up assistance. OT Short Term Goal 3 (Week 1): Pt will complete LB dressing with minimal assistance using LRAD and AE as needed. OT Short Term Goal 4 (Week 1): Pt will complete bathing tasks with supervison using AE and DME. OT Short Term Goal 5 (Week 1): Pt will complete toilet transfer with minimal assistance using LRAD. Week 2:     Skilled Therapeutic Interventions/Progress Updates:  Patient seen for skilled OT treatment, the pt was seated in his w/c upon arrival and indicated that he would like to wash up at sink LOF.  The pt was able to remove his shirt with close S, he was able to wash his face, chest, and BUE with s/u assist and initial cues at w/c LOF.  The pt was able to come from sit to stand using the RW with ModA x 1 for washing  his perineal area and bottom with MinA secondary to challenges with bilateral hand coordination and  compromised functional balance during standing.  The pt was able to donn his LB garments consisting of shorts with MinA at w/c LOF.   The pt was able to brush his teeth and comb his hair at sink  with s/u assist.  The pt remained at w/c LOF with his chair alarm and bedside table within reach and all additional needs addressed prior to me exiting the room.  During this treatment session, the pt indicated that he had a pain response of 6 on a 0-10 scale , nursing was made aware of the his concerns.    Therapy Documentation Precautions:  Precautions Precautions: Fall Precaution Comments: wound vac Required Braces or Orthoses: Other Brace Other Brace: L limb  protector Restrictions Weight Bearing Restrictions: Yes LLE Weight Bearing: Non weight bearing  Therapy/Group: Individual Therapy  Yvonne Kendall 09/06/2021, 4:11 PM

## 2021-09-07 ENCOUNTER — Other Ambulatory Visit (HOSPITAL_COMMUNITY): Payer: Self-pay

## 2021-09-07 LAB — CBC
HCT: 33.5 % — ABNORMAL LOW (ref 39.0–52.0)
Hemoglobin: 10.9 g/dL — ABNORMAL LOW (ref 13.0–17.0)
MCH: 27.5 pg (ref 26.0–34.0)
MCHC: 32.5 g/dL (ref 30.0–36.0)
MCV: 84.6 fL (ref 80.0–100.0)
Platelets: 269 10*3/uL (ref 150–400)
RBC: 3.96 MIL/uL — ABNORMAL LOW (ref 4.22–5.81)
RDW: 13.4 % (ref 11.5–15.5)
WBC: 6.6 10*3/uL (ref 4.0–10.5)
nRBC: 0 % (ref 0.0–0.2)

## 2021-09-07 LAB — BASIC METABOLIC PANEL
Anion gap: 13 (ref 5–15)
BUN: 26 mg/dL — ABNORMAL HIGH (ref 8–23)
CO2: 27 mmol/L (ref 22–32)
Calcium: 8.9 mg/dL (ref 8.9–10.3)
Chloride: 97 mmol/L — ABNORMAL LOW (ref 98–111)
Creatinine, Ser: 0.72 mg/dL (ref 0.61–1.24)
GFR, Estimated: >60 mL/min (ref >60)
Glucose, Bld: 115 mg/dL — ABNORMAL HIGH (ref 70–99)
Potassium: 4.1 mmol/L (ref 3.5–5.1)
Sodium: 137 mmol/L (ref 135–145)

## 2021-09-07 LAB — GLUCOSE, CAPILLARY
Glucose-Capillary: 131 mg/dL — ABNORMAL HIGH (ref 70–99)
Glucose-Capillary: 101 mg/dL — ABNORMAL HIGH (ref 70–99)
Glucose-Capillary: 95 mg/dL (ref 70–99)
Glucose-Capillary: 122 mg/dL — ABNORMAL HIGH (ref 70–99)

## 2021-09-07 MED ORDER — DULOXETINE HCL 30 MG PO CPEP
60.0000 mg | ORAL_CAPSULE | Freq: Every day | ORAL | Status: DC
  Administered 2021-09-07 – 2021-09-14 (×8): 60 mg via ORAL
  Filled 2021-09-07 (×8): qty 2

## 2021-09-07 NOTE — Progress Notes (Signed)
Physical Therapy Session Note  Patient Details  Name: Randall Coleman MRN: [id number] Date of Birth: 01/17/1951  Today's Date: 09/07/2021 PT Individual Time: 0240-9735 PT Individual Time Calculation (min): 40 min   Short Term Goals: Week 1:  PT Short Term Goal 1 (Week 1): Pt will perform least restrictive transfer with min A consistently PT Short Term Goal 2 (Week 1): Pt will maintain standing balance x 5 min while completing functional task with min A PT Short Term Goal 3 (Week 1): Pt will initiate gait training  Skilled Therapeutic Interventions/Progress Updates:     Pt received supine in bed and agrees to therapy. Reports 7/10 pain in L residual limb. PT provides rest breaks as needed to manage pain. Pt performs supine to sit with bed features and verbal cues for positioning at EOB. Squat pivot to [facility] with CGA/minA at hips and cues for initiation and sequencing. Pt self propels WC with bilateral upper extremities x300' to gym. Performed for strengthening and WC mobility training. Pt performs squat pivot transfer to Nustep with CGA and cues for hand placement. Pt completes Nustep for strength and endurance training. Pt completes x10:00 at workload of 4 with average steps per minute ~45. Pt takes seated rest break following with report of fatigue in arms and R leg. Pt then performs squat pivot to WC with minA and education on slowing movement and utilizing eccentric control for safety due to pt lunging backward during transfer. Pt performs sit to stand with CGA and cues for hand placement. Pt ambulates with RW x20' with CGA and cues hip and trunk extension to improve posture and balance. Pt self propels WC back to room. Left seated with alarm intact and all needs within reach.  Therapy Documentation Precautions:  Precautions Precautions: Fall Precaution Comments: wound vac Required Braces or Orthoses: Other Brace Other Brace: L limb protector Restrictions Weight Bearing Restrictions: Yes LLE  Weight Bearing: Non weight bearing   Therapy/Group: Individual Therapy  Breck Coons, PT, DPT 09/07/2021, 5:28 PM

## 2021-09-07 NOTE — Progress Notes (Signed)
Patient ID: Randall Coleman, male   DOB: [birth year], 71 y.o.   MRN: [id number]  SW met with pt during PT session to discuss discharge plan. Reports he is considering moving to [place] temporarily where his sister and her husband can provide the support he will need at discharge. SW will speak with pt and pt sister tomorrow to discuss plan of care further. SW waiting on updates from [facility] to discuss process for transition to [place] site in [place] if this remains the discharge plan.   Loralee Pacas, MSW, [place] Office: [phone] Cell: [phone] Fax: [phone]

## 2021-09-07 NOTE — Progress Notes (Signed)
Physical Therapy Session Note  Patient Details  Name: Randall Coleman MRN: [id number] Date of Birth: 07-03-1950  Today's Date: 09/07/2021 PT Individual Time: 1300-1410 PT Individual Time Calculation (min): 70 min   Short Term Goals: Week 1:  PT Short Term Goal 1 (Week 1): Pt will perform least restrictive transfer with min A consistently PT Short Term Goal 2 (Week 1): Pt will maintain standing balance x 5 min while completing functional task with min A PT Short Term Goal 3 (Week 1): Pt will initiate gait training  Skilled Therapeutic Interventions/Progress Updates:    Pt received sidelying in bed resting, agreeable to therapy session. Pt reports ongoing pain in L residual limb, able to receive pain medication at end of session. Utilized repositioning and rest breaks as needed for pain management. Bed mobility Supervision. Squat pivot transfer to w/c with CGA, cues for management of w/c parts. Manual w/c propulsion up to 200 ft with use of BUE and RLE at Supervision level. Sit to stand with min A to RW. Standing balance and alt UE support on RW while reaching for bean bags outside of BOS and placing in bucket across midline. Pt exhibits decreased standing balance when reaching with LUE as compared to RUE. Pt reports he feels that his RLE is going to "give out" while standing due to feeling more fatigued this date. Discontinued standing activity due to pt feeling of weakness and instability. Squat pivot transfer to/from mat table with CGA. Reviewed how to correctly don/doff limb guard via written out steps in pt notebook. Pt able to don and doff limb guard with written out cues. Seated LLE SLR and hip abd x 15 reps each. Prone LLE hip ext and HS curls x 10 reps each. Reviewed importance of spending time in prone to stretch hip flexors, pt understanding of education and tolerant of position though he does report it is not the most comfortable. Pt left seated in w/c in room with needs in reach, quick release  belt and chair alarm in place.  Therapy Documentation Precautions:  Precautions Precautions: Fall Precaution Comments: wound vac Required Braces or Orthoses: Other Brace Other Brace: L limb protector Restrictions Weight Bearing Restrictions: Yes LLE Weight Bearing: Non weight bearing       Therapy/Group: Individual Therapy   Excell Seltzer, PT, DPT, CSRS 09/07/2021, 3:39 PM

## 2021-09-07 NOTE — Progress Notes (Signed)
PROGRESS NOTE   Subjective/Complaints:  Pt reports LBM yesterday.  Urinating well  Eating 100% meals.  Pain not quite as bad- ~ 4/10 with meds and VAC off.    ROS:  Pt denies SOB, abd pain, CP, N/V/C/D, and vision changes Per HPI- otherwise (-)  Objective:   No results found. Recent Labs    09/07/21 0548  WBC 6.6  HGB 10.9*  HCT 33.5*  PLT 269    Recent Labs    09/07/21 0548  NA 137  K 4.1  CL 97*  CO2 27  GLUCOSE 115*  BUN 26*  CREATININE 0.72  CALCIUM 8.9     Intake/Output Summary (Last 24 hours) at 09/07/2021 1835 Last data filed at 09/07/2021 1832 Gross per 24 hour  Intake 180 ml  Output 1375 ml  Net -1195 ml        Physical Exam: Vital Signs Blood pressure 107/63, pulse 61, temperature 98 F (36.7 C), temperature source Oral, resp. rate 18, height '6\' 2"'$  (1.88 m), weight 84.6 kg, SpO2 99 %.      General: awake, alert, appropriate, sitting up in bed; just woke up;  NAD HENT: conjugate gaze; oropharynx moist CV: regular rate; no JVD Pulmonary: CTA B/L; no W/R/R- good air movement GI: soft, NT, ND, (+)BS Psychiatric: appropriate but sleepy Neurological: poor memory, but tracking better today in general Musculoskeletal:     Comments: Left BK stump bulbous.tender -wearing shrinker and limb guard- VAC off Skin:    General: Skin is warm and dry.     Comments: Wound vac removed 7/8, staples present at incision site, CDI.  Dry dressing with Kerlix present. Onychomycosis of right toenails.   Neurological:     Mental Status: He is alert and oriented to person, place, and time.     Cranial Nerves: No cranial nerve deficit.     Comments: Oriented to self and situation--place-still need cues for month. CN exam intact. UE motor 5/5. RLE 4/5. LLE 3/5 HF, KE/KF. Decreased LT distal RLE below ankles   Assessment/Plan: 1. Functional deficits which require 3+ hours per day of interdisciplinary  therapy in a comprehensive inpatient rehab setting. Physiatrist is providing close team supervision and 24 hour management of active medical problems listed below. Physiatrist and rehab team continue to assess barriers to discharge/monitor patient progress toward functional and medical goals  Care Tool:  Bathing    Body parts bathed by patient: Right arm, Left arm, Chest, Abdomen, Front perineal area, Right upper leg, Left upper leg, Face, Right lower leg   Body parts bathed by helper: Right lower leg Body parts n/a: Left lower leg   Bathing assist Assist Level: Minimal Assistance - Patient > 75%     Upper Body Dressing/Undressing Upper body dressing   What is the patient wearing?: Pull over shirt    Upper body assist Assist Level: Set up assist    Lower Body Dressing/Undressing Lower body dressing      What is the patient wearing?: Pants     Lower body assist Assist for lower body dressing: Contact Guard/Touching assist     Toileting Toileting    Toileting assist Assist for toileting: Contact Guard/Touching  assist     Transfers Chair/bed transfer  Transfers assist     Chair/bed transfer assist level: Contact Guard/Touching assist     Locomotion Ambulation   Ambulation assist   Ambulation activity did not occur: Safety/medical concerns  Assist level: Contact Guard/Touching assist Assistive device: Walker-rolling Max distance: 22   Walk 10 feet activity   Assist  Walk 10 feet activity did not occur: Safety/medical concerns  Assist level: Contact Guard/Touching assist Assistive device: Walker-rolling   Walk 50 feet activity   Assist Walk 50 feet with 2 turns activity did not occur: Safety/medical concerns         Walk 150 feet activity   Assist Walk 150 feet activity did not occur: Safety/medical concerns         Walk 10 feet on uneven surface  activity   Assist Walk 10 feet on uneven surfaces activity did not occur:  Safety/medical concerns         Wheelchair     Assist Is the patient using a wheelchair?: Yes Type of Wheelchair: Manual Wheelchair activity did not occur: Refused  Wheelchair assist level: Supervision/Verbal cueing Max wheelchair distance: 200'    Wheelchair 50 feet with 2 turns activity    Assist    Wheelchair 50 feet with 2 turns activity did not occur: Refused   Assist Level: Supervision/Verbal cueing   Wheelchair 150 feet activity     Assist  Wheelchair 150 feet activity did not occur: Refused   Assist Level: Supervision/Verbal cueing   Blood pressure 107/63, pulse 61, temperature 98 F (36.7 C), temperature source Oral, resp. rate 18, height '6\' 2"'$  (1.88 m), weight 84.6 kg, SpO2 99 %.  Medical Problem List and Plan: 1. 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   Con't CIR_ PT, OT and SLP for cognition 2.  Antithrombotics: -DVT/anticoagulation:  Pharmaceutical: Other (comment)--Eliquis             -antiplatelet therapy:  N/A 3. Pain Management: Oxycodone prn             -add gabapentin '100mg'$  TID for phantom limb pain  7/7- will d/c Gabapentin due ot memory issues and add Lyrica 50 mg TID; also started Cymbalta yesterday- no nausea so far-  7/9 No side effects from Cymbalta, continue Lyrica 50 mg TID  7/10- no side effects- will increase Cymbalta as of the AM to 60 mg daily.              -discussed massage and desensitization 4. Mood/Sleep: LCSW to follow for evaluation and support.              -antipsychotic agents: N/A 5. Neuropsych/cognition: This patient is capable of making decisions on his own behalf. 6. Skin/Wound Care: Continue wound VAC for 7 days post-op  7/6- VAC to be d/c'd 09/05/21  7/7- wrote order to d/c [facility]  tomorrow if not already off.  7/8 Wound vac removed this afternoon by nurse.  7/9 Wound vac removed 7/8, staples present at incision site, CDI.  Dry dressing with Kerlix present. 7. Fluids/Electrolytes/Nutrition: Has been on IVF since admission-->d/c.  --Check CMET in am             --  encourage intake. Will change glucerna to TID with meals to avoid BS spikes             --continue Juven, Vitamin C and Zinc to promote wound healing.  8. A fib: Monitor HR TID--currently controlled on Cardizem 120 mg BID and metoprolol 50 mg bid             --on Eliquis w/ H/H stable 9. T2DM: Hgb A1c-7.9-- 10.7 in 4 months likely due to infection/now controlled.   -- Mild DKA-->beta hydroxybutyric acid 3.55-->0.91 --will continue to monitor BS ac/hs and titrate meds as indicated.              --Was on Metformin 1000 mg bid at home             --continue Levemir (increased to 15 u BID 07/04) w/ 4 units novolog  ac TID. Titrate as indicated.  7/5- Cbg's 200-300s- just got increase yesterday- will add metformin 500 mg BID back to regimen.   7/6- CBG's look better this AM- 178- with addition of Metformin- will monitor and make changes tomorrow as required  7/7- CBG's- mid to upper 100s- will increase metformin to 850 mg BID and monitor CBG's.  7/8 Continue to monitor as change to metformin dose still new. 7/9 CBG's 132-161 today.  7/10- CBGs well controlled- wait to start other insulin- con't regimen CBG (last 3)  Recent Labs    09/07/21 0602 09/07/21 1200 09/07/21 1645  GLUCAP 131* 101* 95    10. Depressed systolic LVF: Started on Sacubitril-valsartan bid on 07/02. -Monitor renal function              -daily weights 11. ABLA/anemia of chronic illness: Monitor H/H with serial checks. 7/6- Hb 11.6- will monitor to monitor- doesn't want iron at this time.  7/10- Hb 10.90 overall stable, but will monitor trend.  12. Hypomagnesemia: Improved post IV supplement and on oral supplement -likely due to  malnutrition. Will recheck in am.   7/5- Mg level is 1.9- will con't to monitor 13. Chronic hyponatremia: Improving. Will recheck Na level in am.    7/5- Na is 134- will monitor for trends.  7/10- Na 137 14. Constipation 7/7- gave sorbitol without a BM- will order soap suds enema today if no BM by 3pm- pt still feels constipated  7/10- LBM yesterday- con't regimen     LOS: 6 days A FACE TO FACE EVALUATION WAS PERFORMED  Randall Coleman 09/07/2021, 6:35 PM

## 2021-09-07 NOTE — Progress Notes (Signed)
Occupational Therapy Session Note  Patient Details  Name: Kenyetta Fife MRN: [id number] Date of Birth: 09-Sep-1950  Today's Date: 09/07/2021 OT Individual Time: 1045-1200 OT Individual Time Calculation (min): 75 min    Short Term Goals: Week 1:  OT Short Term Goal 1 (Week 1): Pt will complete toileting with minimal assistance. OT Short Term Goal 2 (Week 1): Pt wil complete UB dressing with set-up assistance. OT Short Term Goal 3 (Week 1): Pt will complete LB dressing with minimal assistance using LRAD and AE as needed. OT Short Term Goal 4 (Week 1): Pt will complete bathing tasks with supervison using AE and DME. OT Short Term Goal 5 (Week 1): Pt will complete toilet transfer with minimal assistance using LRAD.  Skilled Therapeutic Interventions/Progress Updates:    Upon OT arrival, pt supine in bed. Pt reports 7/10 pain and recently received pain medication. Pt agreeable to OT treatment session. Pt completes supine to sit transfer with Supervision and completes UB/LB dressing seated EOB at levels stated below. Pt completes sit to stand transfer with Mod A for balance. Pt unsafe and had LOB landing back on bed with assist to lower. Pt completes squat pivot transfer into w/c with Supervision. Pt propels himself to sink with Mod I and completes oral care and washing face with Supervision. Pt propels himself to ortho gym with Mod I and performs arm bike for 12 minutes. Pt reports dizziness but reports it resolves. Pt sits at tabletop to sort cards using B UE based on suit and in numerical order without errors. Pt requires increased time. Pt propels himself back to his room Mod I to engage in transfer training (sit<>stand) for 1x10 reps with CGA. Pt requires verbal cues for safe hand placement and to complete slow and controlled. Pt propels hiimself to vending machine ~150' Mod I to retrieve snack with Supervision. Pt returns to his room via w/c and Mod I and completes 10 more sit<>stand transfers with  CGA. Pt propels himself to his bed and performs squat pivot transfer back to bed with Supervision. Pt completes sit to supine transfer with Supervision and was left in bed with all needs met. Pt making progress towards stated OT goals and continues to benefit from OT services to achieve highest level of independence.   Therapy Documentation Precautions:  Precautions Precautions: Fall Precaution Comments: wound vac Required Braces or Orthoses: Other Brace Other Brace: L limb protector Restrictions Weight Bearing Restrictions: Yes LLE Weight Bearing: Non weight bearing  ADL: Grooming: Supervision/safety Where Assessed-Grooming: Sitting at sink Upper Body Dressing: Setup Where Assessed-Upper Body Dressing: Edge of bed Lower Body Dressing: Contact guard Where Assessed-Lower Body Dressing: Edge of bed   Therapy/Group: Individual Therapy  Marvetta Gibbons 09/07/2021, 12:15 PM

## 2021-09-08 ENCOUNTER — Other Ambulatory Visit (HOSPITAL_COMMUNITY): Payer: Self-pay

## 2021-09-08 LAB — GLUCOSE, CAPILLARY
Glucose-Capillary: 98 mg/dL (ref 70–99)
Glucose-Capillary: 141 mg/dL — ABNORMAL HIGH (ref 70–99)
Glucose-Capillary: 89 mg/dL (ref 70–99)
Glucose-Capillary: 104 mg/dL — ABNORMAL HIGH (ref 70–99)

## 2021-09-08 MED ORDER — SENNA 8.6 MG PO TABS
2.0000 | ORAL_TABLET | Freq: Two times a day (BID) | ORAL | Status: DC
  Administered 2021-09-08 – 2021-09-09 (×2): 17.2 mg via ORAL
  Filled 2021-09-08 (×2): qty 2

## 2021-09-08 MED ORDER — SORBITOL 70 % SOLN
45.0000 mL | Freq: Once | Status: AC
Start: 2021-09-08 — End: 2021-09-08
  Administered 2021-09-08: 45 mL via ORAL
  Filled 2021-09-08: qty 60

## 2021-09-08 MED ORDER — POLYETHYLENE GLYCOL 3350 17 G PO PACK
34.0000 g | PACK | Freq: Every day | ORAL | Status: DC
  Filled 2021-09-08 (×2): qty 2

## 2021-09-08 NOTE — Progress Notes (Signed)
Physical Therapy Session Note  Patient Details  Name: Randall Coleman MRN: [id number] Date of Birth: 1950-06-24  Today's Date: 09/08/2021 PT Individual Time: 1130-1155; 1400-1445 PT Individual Time Calculation (min): 25 min and 45 min PT Missed Time: 45 min Missed Time Reason: fatigue, pain  Short Term Goals: Week 1:  PT Short Term Goal 1 (Week 1): Pt will perform least restrictive transfer with min A consistently PT Short Term Goal 2 (Week 1): Pt will maintain standing balance x 5 min while completing functional task with min A PT Short Term Goal 3 (Week 1): Pt will initiate gait training  Skilled Therapeutic Interventions/Progress Updates:    Session 1: Pt received seated in bed, agreeable to PT session. Pt reports pain is well-managed this AM. Reviewed mirror therapy with patient for phantom limb pain as he reports he continues to have phantom limb pain > surgical pain in residual limb. Pt able to doff residual limb guard with min cueing. Pt able to perform heel slides, hip abd, quad sets, and ankle pumps with use of mirror. Supine LLE SLR, hip abd, quad sets x 15 reps each. Sidelying LLE hip abd and hip ext x 15 reps each. Pt able to don residual limb guard with min cueing. Pt left seated in bed with needs in reach and bed alarm in place at end of session.  Session 2: Pt received seated in w/c in room, agreeable to PT session. Pt reports 7/10 pain in L residual limb, unsure of last time of pain medication. Nursing able to provide pain medication during session. Encouraged pt to keep track of pain medication time on white board with assist from nursing so that he can stay ahead of his pain. Pain is more limiting for patient this session. Manual w/c propulsion x 200 ft with use of BUE at Supervision level. Pt able to perform management of w/c parts with min cueing. Squat pivot transfer w/c to/from Nustep with CGA with cues to perform transfer in slow and controlled manner. Nustep level 4 x 6 min  with use of BUE and RLE for global strengthening. Provided HEP for patient (see below), able to review some of exercises before he requests to return to his room due to pain and fatigue, will finishing reviewing exercises next session. Pt returned to bed with CGA. Pt left seated in bed with needs in reach, bed alarm in place. Pt missed 45 min of scheduled therapy session due to pain and fatigue this PM.  Access Code: [email or web address] URL: [email or web address] Date: 09/08/2021 Prepared by: Excell Seltzer  Exercises - Supine Quad Set (BKA)  - 1 x daily - 7 x weekly - 3 sets - 10 reps - Supine Active Straight Leg Raise  - 1 x daily - 7 x weekly - 3 sets - 10 reps - Sidelying Hip Abduction  - 1 x daily - 7 x weekly - 3 sets - 10 reps - Prone Hip Extension with Residual Limb (BKA)  - 1 x daily - 7 x weekly - 3 sets - 10 reps - Seated Knee Extension (BKA)  - 1 x daily - 7 x weekly - 3 sets - 10 reps - Seated March  - 1 x daily - 7 x weekly - 3 sets - 10 reps   Therapy Documentation Precautions:  Precautions Precautions: Fall Precaution Comments: wound vac Required Braces or Orthoses: Other Brace Other Brace: L limb protector Restrictions Weight Bearing Restrictions: Yes LLE Weight Bearing: Non weight bearing  Therapy/Group: Individual Therapy   Excell Seltzer, PT, DPT, CSRS 09/08/2021, 12:11 PM

## 2021-09-08 NOTE — Progress Notes (Signed)
Occupational Therapy Session Note  Patient Details  Name: Randall Coleman MRN: [id number] Date of Birth: Dec 20, 1950  Today's Date: 09/08/2021 OT Individual Time: 1300-1345 OT Individual Time Calculation (min): 45 min    Short Term Goals: Week 2:  OT Short Term Goal 1 (Week 2): STG=LTG 2/2 ELOS  Skilled Therapeutic Interventions/Progress Updates:    Pt greeted semi-reclined in bed and agreeable to OT treatment session. Pt came to sitting EOB w/ supervision. He went to don his shoe, but was scooting too close to EOB and L limb guard was about to touch the ground. OT educated heavily on safety awareness and importance of protecting L residual limb. Pt reported he forgets he does not have a leg there. Pt donned shoe with set-up A, then squat-pivot to drom arm wc on R side with min A. Pt propelled wc to therapy gym. Worked on sit<> stands and standing balance/endurance. Pt stood at high-low table with min A to power up. Practiced unilateral UE support while performing slide board puzzle. Pt needed min cues to problem solve task and facilitation to maintain full hip and trunk extension. Tolerated standing at 3 minute intervals and OT watching for R knee buckle. Pt then completed 3 sets of 10 standing hip extension, hip abduction, then seated knee extension. OT provided education on desensitization techniques, skin integrity, and parts of prosthesis. Pt returned to room and left seated in wc w alarm belt on, call bell in reach and needs met.    Therapy Documentation Precautions:  Precautions Precautions: Fall Precaution Comments: wound vac Required Braces or Orthoses: Other Brace Other Brace: L limb protector Restrictions Weight Bearing Restrictions: Yes LLE Weight Bearing: Non weight bearing Pain:  Pt reports 4/10 residual limb pain. Rest and repositioned for comfort   Therapy/Group: Individual Therapy  Valma Cava 09/08/2021, 2:09 PM

## 2021-09-08 NOTE — Patient Care Conference (Signed)
Inpatient RehabilitationTeam Conference and Plan of Care Update Date: 09/08/2021   Time: 11:02 AM    Patient Name: Randall Coleman      Medical Record Number: [id number]  Date of Birth: 24-Sep-1950 Sex: Male         Room/Bed: 4M12C/4M12C-01 Payor Info: Payor: [employer] / Plan: [place] / Product Type: *No Product type* /    Admit Date/Time:  09/01/2021  2:38 PM  Primary Diagnosis:  Below-knee amputation of left lower extremity [facility]  Hospital Problems: Principal Problem:   Below-knee amputation of left lower extremity ([place]) Active Problems:   S/P BKA (below knee amputation), left [facility])    Expected Discharge Date: Expected Discharge Date: 09/15/21  Team Members Present: Physician leading conference: Dr. Courtney Heys Social Worker Present: Loralee Pacas, [place] Nurse Present: Dorthula Nettles, RN PT Present: Excell Seltzer, PT OT Present: Roanna Epley, Nanci Pina, OT SLP Present: Lillie Columbia, SLP PPS Coordinator present : Gunnar Fusi, SLP     Current Status/Progress Goal Weekly Team Focus  Bowel/Bladder   Continent of B/B. Last BM 7/7  Remain continent  Assess for changes in bowel and bladder function.   Swallow/Nutrition/ Hydration             ADL's   bathing-min A; LB dressing-CGA/min A; squat pivot transfers-CGA/min A  supervision overall  BADLs, transfers, education, safety awareness, discharge planning   Mobility   Supervision bed mobility, CGA squat pivot, CGA to min A transfers with RW, gait up to 22 ft with RW CGA, Supervision w/c mobility  Supervision overall, short distance gait  standing balance, amputee edu, LE strengthening, phantom pain management   Communication             Safety/Cognition/ Behavioral Observations            Pain   Pain to L BKA.  Pain less than or equal to 2.  Assess for pain q shift and prn.   Skin   Staples to L bka. Daily dressing change.  No skin breakdown/infection to incision site.  Assess  skin q shift and prn.     Discharge Planning:  Pt is considering [place] [facility]. Plans to have discussion with pt and pt sister tomorrow after team conference discuss plan of care.   Team Discussion: Phantom pain a little worse. Memory not good. Increased Cymbalta, discontinued Gabapentin. Hgb decreased slightly. CGS's good. Started Social worker. Continent B/B, reports 8/10 left leg pain. BKA site is CDI with no s/s infection. Discharge plan unsure at the moment. Will discharge to either [place] or [place]. [facility] will make decision. Will need to be set-up with a primary.   Patient on target to meet rehab goals: yes, supervision goals. Currently CGA/min assist overall. Ambulating short distances with RW. Min assist to [place] with ADL's. Squat pivot transfers improving. Impulsive and poor safety awareness. SLP eval only, at baseline.  *See Care Plan and progress notes for long and short-term goals.   Revisions to Treatment Plan:  Adjusting medications   Teaching Needs: Family education, medication/pain management, skin/wound care, transfer/gait training, etc.   Current Barriers to Discharge: Decreased caregiver support, Home enviroment access/layout, Wound care, Lack of/limited family support, Hemodialysis, Weight bearing restrictions, and discharge location  Possible Resolutions to Barriers: Family education Outpatient therapy Order recommended DME Determine discharge location     Medical Summary Current Status: BKA looks good- healing- staples intact; continent x2- phantom >residual limb pain- 8/10 per nursing; poor STM memory  Barriers to Discharge: Decreased family/caregiver support;Home enviroment access/layout;Medical stability;Weight bearing restrictions;New diabetic;Wound care;Other (comments)  Barriers to Discharge Comments: R BKA- poor memory- going to either [place] or [place]- checking with family if they will come stay with him Possible Resolutions  to [employer]: barriers- significant phantom/residual limb pain, poor memory and impulsive; family situation- - CGA-min A overall a lot of cues for impulsivity- S goals-anxious with standing as well- SLP- eval only for memory notebook- at baseline- 7/18 d/c   Continued Need for Acute Rehabilitation Level of Care: The patient requires daily medical management by a physician with specialized training in physical medicine and rehabilitation for the following reasons: Direction of a multidisciplinary physical rehabilitation program to maximize functional independence : Yes Medical management of patient stability for increased activity during participation in an intensive rehabilitation regime.: Yes Analysis of laboratory values and/or radiology reports with any subsequent need for medication adjustment and/or medical intervention. : Yes   I attest that I was present, lead the team conference, and concur with the assessment and plan of the team.   Cristi Loron 09/08/2021, 1:57 PM

## 2021-09-08 NOTE — Progress Notes (Signed)
Occupational Therapy Session Note  Patient Details  Name: Arne Schlender MRN: [id number] Date of Birth: [birth year]  Today's Date: 09/08/2021 OT Individual Time: 0930-1030 OT Individual Time Calculation (min): 60 min    Short Term Goals: Week 2:  OT Short Term Goal 1 (Week 2): STG=LTG 2/2 ELOS  Skilled Therapeutic Interventions/Progress Updates:    Pt resting in bed upon arrival and agreeable to getting OOB for therapy. Pt declined changing clothing this morning. OT intervention with focus on bed mobility, functional transfers, BUE strengthening, sit<>stand, standing balance, w/c setup, and safety awareness to increase independence with BADLs. Supine>sit EOB with supervision. Squat pivot transfer with CGA and min verbal cues for safety. Pt propelled w/c to Day Room. Initial focus on BUE strengthening with 3# and 4# bar tapping small therapy ball to OTA-3x12 with each bar. Pt educated on doffing/donning limb guard and return demonstrated with supervisoin. Pt educated on removing and placing Bil leg rest. Pt required min A to complete tasks. Sit<>stand X 6 with CGA and min verbal cues for sequencing and safety. Pt returned to room and tranferrred back to bed with CGA. Pt remained in bed with all needs within reach and bed alarm activated.   Therapy Documentation Precautions:  Precautions Precautions: Fall Precaution Comments: wound vac Required Braces or Orthoses: Other Brace Other Brace: L limb protector Restrictions Weight Bearing Restrictions: Yes LLE Weight Bearing: Non weight bearing Pain:  Pt c/o 7/10 LLE phantom pain  Therapy/Group: Individual Therapy  Leroy Libman 09/08/2021, 11:49 AM

## 2021-09-08 NOTE — Progress Notes (Signed)
Patient ID: Randall Coleman, male   DOB: December 28, 1950, 71 y.o.   MRN: [id number]  SW met with pt in room and he call his sister Malachy Mood so SW could provide updates from team conference, and d/c date 7/18. When discussing the discharge plan. She reports her home in [facility] is not an option due to her own physical disabilities, and they had discussed SNF placement. SW explained SNF placement process and getting VA approval and/or using Medicare A/B. SW shared will followup with medical team to see if an extension is appropriate, and if VA will allow for SNF placement. Plans to discuss updates on Thursday to discuss best plan of care.   Loralee Pacas, MSW, [place] Office: [phone] Cell: [phone] Fax: [phone]

## 2021-09-08 NOTE — Progress Notes (Signed)
Occupational Therapy Weekly Progress Note  Patient Details  Name: Randall Coleman MRN: [id number] Date of Birth: May 24, 1950  Beginning of progress report period: September 02, 2021 End of progress report period: September 08, 2021  Patient has met 3 of 5 short term goals.  Pt is making steady progress with BADLs and functional transfers. Pt requires min A for bathing tasks with sit<>stand from w/c. LB dressing with CGA sit<>stand from w/c. Squat pivot tranfsers with CGA. Pt requires mod A/max A for toileting. Family has not been present for education.  Patient continues to demonstrate the following deficits: {impairments:3041632} and therefore will continue to benefit from skilled OT intervention to enhance overall performance with {ADL/iADL:3041649}.  Patient {LTG progression:3041653}.  {plan of MSXJ:1552080}  OT Short Term Goals Week 1:  OT Short Term Goal 1 (Week 1): Pt will complete toileting with minimal assistance. OT Short Term Goal 1 - Progress (Week 1): Progressing toward goal OT Short Term Goal 2 (Week 1): Pt wil complete UB dressing with set-up assistance. OT Short Term Goal 2 - Progress (Week 1): Met OT Short Term Goal 3 (Week 1): Pt will complete LB dressing with minimal assistance using LRAD and AE as needed. OT Short Term Goal 3 - Progress (Week 1): Met OT Short Term Goal 4 (Week 1): Pt will complete bathing tasks with supervison using AE and DME. OT Short Term Goal 4 - Progress (Week 1): Progressing toward goal OT Short Term Goal 5 (Week 1): Pt will complete toilet transfer with minimal assistance using LRAD. OT Short Term Goal 5 - Progress (Week 1): Met Week 2:  OT Short Term Goal 1 (Week 2): STG=LTG 2/2 ELOS  Leotis Shames [facility] 09/08/2021, 7:00 AM

## 2021-09-08 NOTE — Progress Notes (Signed)
PROGRESS NOTE   Subjective/Complaints:  No side effects from cymbalta increase.  Last few days phantom pain has been a little worse, not better.   ROS:  Pt denies SOB, abd pain, CP, N/V/C/D, and vision changes   Objective:   No results found. Recent Labs    09/07/21 0548  WBC 6.6  HGB 10.9*  HCT 33.5*  PLT 269    Recent Labs    09/07/21 0548  NA 137  K 4.1  CL 97*  CO2 27  GLUCOSE 115*  BUN 26*  CREATININE 0.72  CALCIUM 8.9     Intake/Output Summary (Last 24 hours) at 09/08/2021 0838 Last data filed at 09/08/2021 0756 Gross per 24 hour  Intake 300 ml  Output 1000 ml  Net -700 ml        Physical Exam: Vital Signs Blood pressure 119/64, pulse 64, temperature 98 F (36.7 C), resp. rate 18, height '6\' 2"'$  (1.88 m), weight 84.6 kg, SpO2 98 %.      General: awake, alert, appropriate,laying on side in bed; NAD HENT: conjugate gaze; oropharynx moist CV: regular rate; no JVD Pulmonary: CTA B/L; no W/R/R- good air movement GI: soft, NT, ND, (+)BS Psychiatric: appropriate Neurological: poor memory- but social interaction good Musculoskeletal:     Comments: Left BK stump bulbous.tender -wearing shrinker and limb guard- VAC off Skin:    General: Skin is warm and dry.     Comments: Wound vac removed 7/8, staples present at incision site, CDI.  Dry dressing with Kerlix present. Onychomycosis of right toenails.   Neurological:     Mental Status: He is alert and oriented to person, place, and time.     Cranial Nerves: No cranial nerve deficit.     Comments: Oriented to self and situation--place-still need cues for month. CN exam intact. UE motor 5/5. RLE 4/5. LLE 3/5 HF, KE/KF. Decreased LT distal RLE below ankles   Assessment/Plan: 1. Functional deficits which require 3+ hours per day of interdisciplinary therapy in a comprehensive inpatient rehab setting. Physiatrist is providing close team  supervision and 24 hour management of active medical problems listed below. Physiatrist and rehab team continue to assess barriers to discharge/monitor patient progress toward functional and medical goals  Care Tool:  Bathing    Body parts bathed by patient: Right arm, Left arm, Chest, Abdomen, Front perineal area, Right upper leg, Left upper leg, Face, Right lower leg   Body parts bathed by helper: Right lower leg Body parts n/a: Left lower leg   Bathing assist Assist Level: Minimal Assistance - Patient > 75%     Upper Body Dressing/Undressing Upper body dressing   What is the patient wearing?: Pull over shirt    Upper body assist Assist Level: Set up assist    Lower Body Dressing/Undressing Lower body dressing      What is the patient wearing?: Pants     Lower body assist Assist for lower body dressing: Contact Guard/Touching assist     Toileting Toileting    Toileting assist Assist for toileting: Contact Guard/Touching assist     Transfers Chair/bed transfer  Transfers assist     Chair/bed transfer assist level: Contact  Guard/Touching assist     Locomotion Ambulation   Ambulation assist   Ambulation activity did not occur: Safety/medical concerns  Assist level: Contact Guard/Touching assist Assistive device: Walker-rolling Max distance: 22   Walk 10 feet activity   Assist  Walk 10 feet activity did not occur: Safety/medical concerns  Assist level: Contact Guard/Touching assist Assistive device: Walker-rolling   Walk 50 feet activity   Assist Walk 50 feet with 2 turns activity did not occur: Safety/medical concerns         Walk 150 feet activity   Assist Walk 150 feet activity did not occur: Safety/medical concerns         Walk 10 feet on uneven surface  activity   Assist Walk 10 feet on uneven surfaces activity did not occur: Safety/medical concerns         Wheelchair     Assist Is the patient using a wheelchair?:  Yes Type of Wheelchair: Manual Wheelchair activity did not occur: Refused  Wheelchair assist level: Supervision/Verbal cueing Max wheelchair distance: 200'    Wheelchair 50 feet with 2 turns activity    Assist    Wheelchair 50 feet with 2 turns activity did not occur: Refused   Assist Level: Supervision/Verbal cueing   Wheelchair 150 feet activity     Assist  Wheelchair 150 feet activity did not occur: Refused   Assist Level: Supervision/Verbal cueing   Blood pressure 119/64, pulse 64, temperature 98 F (36.7 C), resp. rate 18, height '6\' 2"'$  (1.88 m), weight 84.6 kg, SpO2 98 %.  Medical Problem List and Plan: 1. 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   Con't CIR- PT, OT and SLP- team conference today to determine length of stay 2.  Antithrombotics: -DVT/anticoagulation:  Pharmaceutical: Other (comment)--Eliquis             -antiplatelet therapy:  N/A 3. Pain Management: Oxycodone prn             -add gabapentin '100mg'$  TID for phantom limb pain  7/7- will d/c Gabapentin due ot memory issues and add Lyrica 50 mg TID; also started Cymbalta yesterday- no nausea so far-  7/9 No side effects from Cymbalta, continue Lyrica 50 mg TID  7/10- no side effects- will increase Cymbalta as of the AM to 60 mg daily.   7/11- no side effects with increase in cymbalta.              -discussed massage and desensitization 4. Mood/Sleep: LCSW to follow for evaluation and support.              -antipsychotic agents: N/A 5. Neuropsych/cognition: This patient is capable of making decisions on his own behalf. 6. Skin/Wound Care: Continue wound VAC for 7 days post-op  7/6- VAC to be d/c'd 09/05/21  7/7- wrote order to d/c [facility] tomorrow if not already off.  7/8 Wound  vac removed this afternoon by nurse.  7/9 Wound vac removed 7/8, staples present at incision site, CDI.  Dry dressing with Kerlix present.  7/11- con't shrinker 7. Fluids/Electrolytes/Nutrition: Has been on IVF since admission-->d/c.  --Check CMET in am             --  encourage intake. Will change glucerna to TID with meals to avoid BS spikes             --continue Juven, Vitamin C and Zinc to promote wound healing.  8. A fib: Monitor HR TID--currently controlled on Cardizem 120 mg BID and metoprolol 50 mg bid             --on Eliquis w/ H/H stable 9. T2DM: Hgb A1c-7.9-- 10.7 in 4 months likely due to infection/now controlled.   -- Mild DKA-->beta hydroxybutyric acid 3.55-->0.91 --will continue to monitor BS ac/hs and titrate meds as indicated.              --Was on Metformin 1000 mg bid at home             --continue Levemir (increased to 15 u BID 07/04) w/ 4 units novolog  ac TID. Titrate as indicated.  7/5- Cbg's 200-300s- just got increase yesterday- will add metformin 500 mg BID back to regimen.   7/6- CBG's look better this AM- 178- with addition of Metformin- will monitor and make changes tomorrow as required  7/7- CBG's- mid to upper 100s- will increase metformin to 850 mg BID and monitor CBG's.  7/8 Continue to monitor as change to metformin dose still new. 7/9 CBG's 132-161 today.  7/11- wait to start new insulin- CBGs controlled CBG (last 3)  Recent Labs    09/07/21 1645 09/07/21 2043 09/08/21 0552  GLUCAP 95 122* 98    10. Depressed systolic LVF: Started on Sacubitril-valsartan bid on 07/02. -Monitor renal function              -daily weights 11. ABLA/anemia of chronic illness: Monitor H/H with serial checks. 7/6- Hb 11.6- will monitor to monitor- doesn't want iron at this time.  7/10- Hb 10.90 overall stable, but will monitor trend.  12. Hypomagnesemia: Improved post IV supplement and on oral supplement -likely due to malnutrition. Will recheck in am.   7/5- Mg level is  1.9- will con't to monitor 13. Chronic hyponatremia: Improving. Will recheck Na level in am.    7/5- Na is 134- will monitor for trends.  7/10- Na 137 14. Constipation 7/7- gave sorbitol without a BM- will order soap suds enema today if no BM by 3pm- pt still feels constipated  7/11- LBM yesterday- con't regimen   I spent a total of 37   minutes on total care today- >50% coordination of care- due to prolonged team conference today   LOS: 7 days A FACE TO FACE EVALUATION WAS PERFORMED  Saxon Crosby 09/08/2021, 8:38 AM

## 2021-09-09 ENCOUNTER — Other Ambulatory Visit (HOSPITAL_COMMUNITY): Payer: Self-pay

## 2021-09-09 LAB — GLUCOSE, CAPILLARY
Glucose-Capillary: 103 mg/dL — ABNORMAL HIGH (ref 70–99)
Glucose-Capillary: 95 mg/dL (ref 70–99)
Glucose-Capillary: 90 mg/dL (ref 70–99)
Glucose-Capillary: 133 mg/dL — ABNORMAL HIGH (ref 70–99)
Glucose-Capillary: 113 mg/dL — ABNORMAL HIGH (ref 70–99)

## 2021-09-09 MED ORDER — SENNOSIDES-DOCUSATE SODIUM 8.6-50 MG PO TABS
2.0000 | ORAL_TABLET | Freq: Two times a day (BID) | ORAL | Status: DC
  Administered 2021-09-09 – 2021-09-15 (×12): 2 via ORAL
  Filled 2021-09-09 (×12): qty 2

## 2021-09-09 NOTE — Progress Notes (Addendum)
Physical Therapy Session Note  Patient Details  Name: Randall Coleman MRN: [id number] Date of Birth: [birth year]  Today's Date: 09/09/2021 PT Individual Time: 1103-1203 and 1430-1540 PT Individual Time Calculation (min): 60 min and 70 min  Short Term Goals: Week 1:  PT Short Term Goal 1 (Week 1): Pt will perform least restrictive transfer with min A consistently PT Short Term Goal 2 (Week 1): Pt will maintain standing balance x 5 min while completing functional task with min A PT Short Term Goal 3 (Week 1): Pt will initiate gait training  Skilled Therapeutic Interventions/Progress Updates: Tx1: Pt presented in bed agreeable therapy. Pt states increased pain this am, but recently premedicated per pt and nsg. Increased time and rest breaks provided as needed. Pt performed supine to sit with supervision, increased time and use of bed features. Performed squat pivot transfer to w/c with CGA and pt propelled to day room with supervision. Pt was able to align w/c, apply brakes, remove leg rests with supervision and min cues. Pt was instructed on how to lift/remove arm rest. Pt transferred to mat CGA in same manner as prior. Pt then participated in therex with use of mirror feedback on RLE. Pt participated in following activities: heel slides, SLR, SAQ, DF 2 x 15. Pt instructed to observe via mirror with emphasis during heel slides. Pt then performed the following on LLE SLR, SAQ 2 x 15 then hip abd in sidelying 2 x 15. Pt was able to transition to long sit and don limb guard with supervision. Pt transitioned to sitting EOM and performed squat pivot to w/c with close S. With increased time pt was able to don amputee pad with minA and R leg rest with supervision. Pt propelled back to room supervision and remained in w/c at end of session with tray set up for lunch, call bell within reach and needs met.   Tx2: Pt presented in w/c agreeable to therapy. Pt states pain 7/10 with nsg notified and pain meds received  during session. Pt propelled to rehab gym and was able to manage w/c with supervision and intermittent verbal cues regarding removing leg rests and releasing arm rest. Pt was able to perform squat pivot transfer with supervision to high/low mat. Pt then indicated that he was now back on plan to move to [facility] at d/c and that brother-in-law will fly up and they will fly back together. Pt did not have any information regarding home set up of sister's house (steps for entry etc). At that time pt also received pain meds. PTA contacted LSW in hopes of receiving any further information. Pt then participated in seated balance activity as well as endurance performing ball taps with 3lb dowel 2 x 30. Participated in standing tolerance task of reaching and placing clothes pins on basketball net in standing. Pt noted to have RLE well under mat when attempting to stand but was able to hop forward and stand without leg bracing on anything to place clothespins. Pt was able to tolerate standing ~3 min each bout. Pt was able to maintain static standing balance with CGA and noted increased RLE instability with fatigue. After seated rest pt ambulated 54f with RW and CGA requesting to sit due to fatigue. Pt then propelled to ortho gym and performed squat pivot with CGA to NuStep. Participated in NuStep L6 x 6 min for general conditioning with pt maintaining ~40-50 SPM. Pt returned to w/c in same manner as prior and once back at room pt requesting to  get soda from vending machine. Pt was able to propel to vending machine supervision and PTA assisted pt in obtaining drink (Coke Zero). Pt transported back to room for time management and pt performed squat pivot to bed with supervision. Pt left in bed at end of session with bed alarm on, call bell within reach and needs met.      Therapy Documentation Precautions:  Precautions Precautions: Fall Precaution Comments: wound vac Required Braces or Orthoses: Other Brace Other Brace: L  limb protector Restrictions Weight Bearing Restrictions: Yes LLE Weight Bearing: Non weight bearing General:   Vital Signs:  Pain: Pain Assessment Pain Scale: 0-10 Pain Score: 8  Pain Type: Acute pain Pain Location: Leg Mobility:   Locomotion :    Trunk/Postural Assessment :    Balance:   Exercises:   Other Treatments:      Therapy/Group: Individual Therapy  Oceane Fosse 09/09/2021, 12:28 PM

## 2021-09-09 NOTE — Progress Notes (Signed)
Occupational Therapy Session Note  Patient Details  Name: Randall Coleman MRN: [id number] Date of Birth: 12-12-1950  Today's Date: 09/09/2021 OT Individual Time: 4854-6270 OT Individual Time Calculation (min): 73 min    Short Term Goals: Week 2:  OT Short Term Goal 1 (Week 2): STG=LTG 2/2 ELOS  Skilled Therapeutic Interventions/Progress Updates:    Pt resting in bed upon arrival. Pt stated he didn't rest well during the night but still agreeable to getting OOB for therapy. Pt declined changing clothing but agreeable to taking a shower tomorrow. OT intervention with focus on squat pivot tranfsers, w/c mobility, sit<>stand, standing balance, w/c setup, BUE strengthening/endurance, and safety awareness to increase independence with BADLs. Squat pivot transfers with CGA and min verbal cues for safety. W/c setup with min verbal cues. W/c mobility with supervision on level surface, CGA for ascending ramp and min A for descending ramp with min verbal cues for safety. Sit<>stand with CGA and CGA for standing balance. Pt engaged in standing tasks at card board using RUE to remove and replace cards on card board. Pt c/o fatigue after approx 5 mins standing. Pt propelled length of main hallway X 3 during session. BUE therex 7 mins level 4 on Sci[employer]. Pt returned to room and tranfserred to bed. Pt remained in bed with all needs within reach and bed alarm activated.   Therapy Documentation Precautions:  Precautions Precautions: Fall Precaution Comments: wound vac Required Braces or Orthoses: Other Brace Other Brace: L limb protector Restrictions Weight Bearing Restrictions: Yes LLE Weight Bearing: Non weight bearing Pain: Pain Assessment Pain Scale: 0-10 Pain Score: 8  Pain Type: Acute pain Pain Location: Leg Pain Descriptors / Indicators: Shooting Pain Onset: On-going Patients Stated Pain Goal: 2 Pain Intervention(s): RN/LPN aware and premedicated    Therapy/Group: Individual Therapy  Leroy Libman 09/09/2021, 9:30 AM

## 2021-09-09 NOTE — Progress Notes (Signed)
Patient ID: Randall Coleman, male   DOB: 1950/10/22, 71 y.o.   MRN: [id number]  SW received phone call from [name] # [phone] reporting that they had a discussion and will now close out his current apartment and have him d/c to [place]. SW discussed process on locating his PCP and SW will verify the appointment, and send appropriate clinicals. SW also shared will ensure [facility] SW Randall Coleman submits referral for coordinating d/c care in [facility] and setting up new PCP. She will follow-up with SW once PCP in place.   SW updated medical team, and [facility] SW Randall Coleman on above changes.   Discharge Address: [address], [place], [place]; [facility]   Randall Coleman, MSW, [place] Office: [phone] Cell: [phone] Fax: [phone]

## 2021-09-09 NOTE — Progress Notes (Signed)
PROGRESS NOTE   Subjective/Complaints:  Pt reports like mirror therapy- thinks it's somewhat helpful.  LBM last night.   Per SW, sister cannot take to [facility] due to her physical disabilities- and sounds like might need SNF for now- will see if can use VA vs Medicare.   ROS:  Pt denies SOB, abd pain, CP, N/V/C/D, and vision changes   Objective:   No results found. Recent Labs    09/07/21 0548  WBC 6.6  HGB 10.9*  HCT 33.5*  PLT 269    Recent Labs    09/07/21 0548  NA 137  K 4.1  CL 97*  CO2 27  GLUCOSE 115*  BUN 26*  CREATININE 0.72  CALCIUM 8.9     Intake/Output Summary (Last 24 hours) at 09/09/2021 0807 Last data filed at 09/09/2021 0736 Gross per 24 hour  Intake 840 ml  Output 2650 ml  Net -1810 ml        Physical Exam: Vital Signs Blood pressure (!) 95/57, pulse 83, temperature 98.2 F (36.8 C), temperature source Oral, resp. rate 18, height '6\' 2"'$  (1.88 m), weight 84.6 kg, SpO2 99 %.       General: awake, alert, appropriate, sitting up in bed; finished 100% tray; NAD HENT: conjugate gaze; oropharynx moist CV: regular rate; no JVD Pulmonary: CTA B/L; no W/R/R- good air movement GI: soft, NT, ND, (+)BS Psychiatric: appropriate- brighter today Neurological: poor memory- social interaction appropriate Musculoskeletal:     Comments: Left BK stump bulbous.tender -wearing shrinker and limb guard- VAC off- dressing C/D/I- localized erythema Skin:    General: Skin is warm and dry.     Comments: Wound vac removed 7/8, staples present at incision site, CDI.  Dry dressing with Kerlix present. Onychomycosis of right toenails.   Neurological:     Mental Status: He is alert and oriented to person, place, and time.     Cranial Nerves: No cranial nerve deficit.     Comments: Oriented to self and situation--place-still need cues for month. CN exam intact. UE motor 5/5. RLE 4/5. LLE 3/5 HF, KE/KF.  Decreased LT distal RLE below ankles   Assessment/Plan: 1. Functional deficits which require 3+ hours per day of interdisciplinary therapy in a comprehensive inpatient rehab setting. Physiatrist is providing close team supervision and 24 hour management of active medical problems listed below. Physiatrist and rehab team continue to assess barriers to discharge/monitor patient progress toward functional and medical goals  Care Tool:  Bathing    Body parts bathed by patient: Right arm, Left arm, Chest, Abdomen, Front perineal area, Right upper leg, Left upper leg, Face, Right lower leg   Body parts bathed by helper: Right lower leg Body parts n/a: Left lower leg   Bathing assist Assist Level: Minimal Assistance - Patient > 75%     Upper Body Dressing/Undressing Upper body dressing   What is the patient wearing?: Pull over shirt    Upper body assist Assist Level: Set up assist    Lower Body Dressing/Undressing Lower body dressing      What is the patient wearing?: Pants     Lower body assist Assist for lower body dressing: Contact Guard/Touching  assist     Toileting Toileting    Toileting assist Assist for toileting: Contact Guard/Touching assist     Transfers Chair/bed transfer  Transfers assist     Chair/bed transfer assist level: Contact Guard/Touching assist     Locomotion Ambulation   Ambulation assist   Ambulation activity did not occur: Safety/medical concerns  Assist level: Contact Guard/Touching assist Assistive device: Walker-rolling Max distance: 22   Walk 10 feet activity   Assist  Walk 10 feet activity did not occur: Safety/medical concerns  Assist level: Contact Guard/Touching assist Assistive device: Walker-rolling   Walk 50 feet activity   Assist Walk 50 feet with 2 turns activity did not occur: Safety/medical concerns         Walk 150 feet activity   Assist Walk 150 feet activity did not occur: Safety/medical  concerns         Walk 10 feet on uneven surface  activity   Assist Walk 10 feet on uneven surfaces activity did not occur: Safety/medical concerns         Wheelchair     Assist Is the patient using a wheelchair?: Yes Type of Wheelchair: Manual Wheelchair activity did not occur: Refused  Wheelchair assist level: Supervision/Verbal cueing Max wheelchair distance: 200'    Wheelchair 50 feet with 2 turns activity    Assist    Wheelchair 50 feet with 2 turns activity did not occur: Refused   Assist Level: Supervision/Verbal cueing   Wheelchair 150 feet activity     Assist  Wheelchair 150 feet activity did not occur: Refused   Assist Level: Supervision/Verbal cueing   Blood pressure (!) 95/57, pulse 83, temperature 98.2 F (36.8 C), temperature source Oral, resp. rate 18, height '6\' 2"'$  (1.88 m), weight 84.6 kg, SpO2 99 %.  Medical Problem List and Plan: 1. 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   D/c 7/18  Con't CIR- PT, OT and SLP- SW trying to wokr out possible SNF placement, since cannot go to [facility]- don't know about the discussion of [place].  2.  Antithrombotics: -DVT/anticoagulation:  Pharmaceutical: Other (comment)--Eliquis             -antiplatelet therapy:  N/A 3. Pain Management: Oxycodone prn             -add gabapentin '100mg'$  TID for phantom limb pain  7/7- will d/c Gabapentin due ot memory issues and add Lyrica 50 mg TID; also started Cymbalta yesterday- no nausea so far-  7/9 No side effects from Cymbalta, continue Lyrica 50 mg TID  7/10- no side effects- will increase Cymbalta as of the AM to 60 mg daily.   7/11- no side effects with increase in cymbalta.   7/12- also getting mirror therapy -feels that's somewhat  helpful-              -discussed massage and desensitization 4. Mood/Sleep: LCSW to follow for evaluation and support.              -antipsychotic agents: N/A 5. Neuropsych/cognition: This patient is capable of making decisions on his own behalf. 6. Skin/Wound Care: Continue wound VAC for 7 days post-op  7/6- VAC to be d/c'd 09/05/21  7/7- wrote order to d/c [facility] tomorrow if not already off.  7/8 Wound vac removed this afternoon by nurse.  7/9 Wound vac removed 7/8, staples present at incision site, CDI.  Dry dressing with Kerlix present.  7/11- con't shrinker  7/12- educated on to prevent hip/knee contractures- will con't to do so, since poor memory. Wearing limb guard 7. Fluids/Electrolytes/Nutrition: Has been on IVF since admission-->d/c.  --Check CMET in am             --encourage intake. Will change glucerna to TID with meals to avoid BS spikes             --continue Juven, Vitamin C and Zinc to promote wound healing.  8. A fib: Monitor HR TID--currently controlled on Cardizem 120 mg BID and metoprolol 50 mg bid             --on Eliquis w/ H/H stable 9. T2DM: Hgb A1c-7.9-- 10.7 in 4 months likely due to infection/now controlled.   -- Mild DKA-->beta hydroxybutyric acid 3.55-->0.91 --will continue to monitor BS ac/hs and titrate meds as indicated.              --Was on Metformin 1000 mg bid at home             --continue Levemir (increased to 15 u BID 07/04) w/ 4 units novolog  ac TID. Titrate as indicated.  7/5- Cbg's 200-300s- just got increase yesterday- will add metformin 500 mg BID back to regimen.   7/6- CBG's look better this AM- 178- with addition of Metformin- will monitor and make changes tomorrow as required  7/7- CBG's- mid to upper 100s- will increase metformin to 850 mg BID and monitor CBG's.  7/8 Continue to monitor as change to metformin dose still new. 7/9 CBG's 132-161 today.  7/12- CBG's look great on metformin, Levemir and Novolog with meals- con't regimen CBG (last  3)  Recent Labs    09/08/21 1657 09/08/21 2109 09/09/21 0605  GLUCAP 89 104* 103*    10. Depressed systolic LVF: Started on Sacubitril-valsartan bid on 07/02. -Monitor renal function              -daily weights 11. ABLA/anemia of chronic illness: Monitor H/H with serial checks. 7/6- Hb 11.6- will monitor to monitor- doesn't want iron at this time.  7/10- Hb 10.90 overall stable, but will monitor trend.  12. Hypomagnesemia: Improved post IV supplement and on oral supplement -likely due to malnutrition. Will recheck in am.   7/5- Mg level is 1.9- will con't to monitor 13. Chronic hyponatremia: Improving. Will recheck Na level in am.    7/5- Na is 134- will monitor for trends.  7/10- Na 137 14. Constipation 7/7- gave sorbitol without a BM- will order soap suds enema today if no BM by 3pm- pt still feels constipated  7/12- LBM yesterday- going well- con't regimen  I spent a total of 38   minutes on total care today- >50% coordination of care- due to d/w with SW about d/c plan      LOS: 8 days A FACE TO FACE EVALUATION WAS PERFORMED  Baneza Bartoszek 09/09/2021, 8:07 AM

## 2021-09-10 LAB — GLUCOSE, CAPILLARY
Glucose-Capillary: 76 mg/dL (ref 70–99)
Glucose-Capillary: 121 mg/dL — ABNORMAL HIGH (ref 70–99)
Glucose-Capillary: 126 mg/dL — ABNORMAL HIGH (ref 70–99)
Glucose-Capillary: 98 mg/dL (ref 70–99)

## 2021-09-10 NOTE — Progress Notes (Signed)
Patient ID: Randall Coleman, male   DOB: 1950-10-09, 71 y.o.   MRN: [id number]  SW left message for pt sister Malachy Mood to follow-up about PCP. SW waiting on follow-up.   SW sent order for RW, w/c, TTB, and DABSC to [place], MSW, [place] Office: [phone] Cell: [phone] Fax: [phone]

## 2021-09-10 NOTE — Progress Notes (Signed)
Physical Therapy Session Note  Patient Details  Name: Randall Coleman MRN: [id number] Date of Birth: 02/10/1951  Today's Date: 09/10/2021 PT Individual Time: 1420-1530 PT Individual Time Calculation (min): 70 min   Short Term Goals: Week 1:  PT Short Term Goal 1 (Week 1): Pt will perform least restrictive transfer with min A consistently PT Short Term Goal 1 - Progress (Week 1): Met PT Short Term Goal 2 (Week 1): Pt will maintain standing balance x 5 min while completing functional task with min A PT Short Term Goal 2 - Progress (Week 1): Met PT Short Term Goal 3 (Week 1): Pt will initiate gait training PT Short Term Goal 3 - Progress (Week 1): Met  Skilled Therapeutic Interventions/Progress Updates: Pt presented in bed with NT present checking vitals. Pt agreeable to therapy. Pt states pain 7-8/10 as he's scheduled to receive pain meds in approx 30 min. BP soft as noted below. Nsg aware and due to lower BP standing activities deferred at this time. Performed bed mobility with supervision and performed squat pivot transfer to w/c with CGA. Pt propelled to rehab gym and was able to remove leg rest, apply brakes, and remove arm rest with supervision and verbal cues. Performed squat pivot to high/low mat with CGA. Pt participated in sitting balance/core activities including use of rebounder with 1Kg weighted ball 2 x 20. Pt also participated in diagonal chops x 15 bilaterally and chest press with weighted ball 2 x 15. Participated in modified sit ups with large wedge 2 x 10 and 1Kg weighted ball. Pt transferred to supine and performed briedges 2 x 10 with bolster placed under residual as pt was unable to complete SL bridge. Participated in modified leg press with use of green physioball and PTA providing manual resistance during knee extension. Pt returned to sitting and performed squat pivot transfer to w/c with CGA.Pt propelled to ortho gym and performed squat pivot to NuStep CGA. Participated in NuStep  L6 x 7 min total with brief reset at 5 min due to fatigue. Pt returned to w/c in same manner as prior and propelled with supervision back to room. Pt transferred to bed with close supervision with pt left in supine at end of session with bed alarm on, call bell within reach and needs met.    Therapy Documentation Precautions:  Precautions Precautions: Fall Precaution Comments: wound vac Required Braces or Orthoses: Other Brace Other Brace: L limb protector Restrictions Weight Bearing Restrictions: Yes LLE Weight Bearing: Non weight bearing General:   Vital Signs: Therapy Vitals Temp: 98 F (36.7 C) Temp Source: Oral Pulse Rate: (!) 51 Resp: 16 BP: (!) 98/57 Patient Position (if appropriate): Lying Oxygen Therapy SpO2: 97 % O2 Device: Room Air Pain: Pain Assessment Pain Scale: 0-10 Pain Score: 8  Mobility:   Locomotion :    Trunk/Postural Assessment :    Balance:   Exercises:   Other Treatments:      Therapy/Group: Individual Therapy    09/10/2021, 3:42 PM  

## 2021-09-10 NOTE — Progress Notes (Signed)
Physical Therapy Weekly Progress Note  Patient Details  Name: Randall Coleman MRN: [id number] Date of Birth: 19-Jun-1950  Beginning of progress report period: September 02, 2021 End of progress report period: September 10, 2021  Today's Date: 09/10/2021 PT Individual Time: 1040-1140 PT Individual Time Calculation (min): 60 min   Patient has met 3 of 3 short term goals.  Pt is making good progress towards therapy goals. He is currently Supervision for bed mobility, CGA for squat pivot transfers and CGA to min A for transfers with RW, CGA for gait up to 20 ft with RW, and Supervision for w/c mobility. Due to short-term memory deficits he does require ongoing cueing and education regarding safety and amputee education.  Patient continues to demonstrate the following deficits muscle weakness and muscle joint tightness, decreased cardiorespiratoy endurance, decreased problem solving, decreased safety awareness, and decreased memory, and decreased standing balance, decreased postural control, and decreased balance strategies and therefore will continue to benefit from skilled PT intervention to increase functional independence with mobility.  Patient progressing toward long term goals..  Continue plan of care.  PT Short Term Goals Week 1:  PT Short Term Goal 1 (Week 1): Pt will perform least restrictive transfer with min A consistently PT Short Term Goal 1 - Progress (Week 1): Met PT Short Term Goal 2 (Week 1): Pt will maintain standing balance x 5 min while completing functional task with min A PT Short Term Goal 2 - Progress (Week 1): Met PT Short Term Goal 3 (Week 1): Pt will initiate gait training PT Short Term Goal 3 - Progress (Week 1): Met Week 2:  PT Short Term Goal 1 (Week 2): =LTG due to ELOS  Skilled Therapeutic Interventions/Progress Updates:  Pt received seated in w/c in room, agreeable to PT session. Pt reports 8/10 pain in L residual limb at surgical site (not phantom pain). Nursing notified  and able to provide pain medication at beginning of session. Pt performs bed mobility at Supervision level during session. Squat pivot transfers to/from w/c at [facility] level during session with good adherence to slow and controlled movements. Sit to stand with CGA to RW. Standing balance with RW and min A while performing horseshoe toss. Ambulation x 20 ft with RW and CGA for balance before onset of fatigue. Pt exhibits gradual increase in R knee flexion as gait progresses. Supine BLE strengthening therex: SLR, hip abd, SAQ x 15 reps each. Sidelying LLE hip abd x 15 reps. Prone L HS curls and hip ext x 10 reps each. Seated BLE marches and LAQ x 15 reps each. Pt able to don/doff limb guard with min cueing. Pt returned to w/c via squat pivot transfer with CGA. Manual w/c propulsion up/down ramp with CGA and cues for safe hand placement on w/c rims to slow descent on ramp. Pt returned to room and left seated in w/c with needs in reach, quick release belt and chair alarm in place at end of session.  Pt will require use of a RW upon d/c for standing balance and gait training due to LLE BKA. He will also require a 16x16 w/c with a L amputee pad due to decreased safety with standing and gait and inability to walk longer distances than ~20 ft due to L BKA to allow for greater independence and safety with functional mobility.  Therapy Documentation Precautions:  Precautions Precautions: Fall Precaution Comments: wound vac Required Braces or Orthoses: Other Brace Other Brace: L limb protector Restrictions Weight Bearing Restrictions: Yes LLE  Weight Bearing: Non weight bearing     Therapy/Group: Individual Therapy   Excell Seltzer, PT, DPT, CSRS 09/10/2021, 11:49 AM

## 2021-09-10 NOTE — Progress Notes (Signed)
Occupational Therapy Session Note  Patient Details  Name: Randall Coleman MRN: [id number] Date of Birth: 07-Jun-1950  Today's Date: 09/10/2021 OT Individual Time: 4098-1191 OT Individual Time Calculation (min): 70 min    Short Term Goals: Week 2:  OT Short Term Goal 1 (Week 2): STG=LTG 2/2 ELOS  Skilled Therapeutic Interventions/Progress Updates:    OT intervention with focus on bed mobility, squat pivot transfers, shower transfers, DABSC transfers, bathing at shower level, dressing with sit<>stand from w/c, w/c mobility, and safety awareness to increase independence with BADLs.  Squat pivot transfers with CGA to TTB and DABSC. Pt will require TTB for bathing and DABSC for all toileting tasks 2/2 decreased standing balance and activity tolerance.  Pt completes bathing tasks seated on TTB with CGA and min verbal cues for safety awareness. Toilet hygiene completed seated using lateral leans.   Pt requires CGA for standing balance to pull pants over hips. Min verbal cues for doffing/donning LLE limb guard.  Pt reported lightheadedness. BP 85/55 and HR 48. RN notified and pt returned to bed.  Pt remained in bed with bed alarm activated.   Therapy Documentation Precautions:  Precautions Precautions: Fall Precaution Comments: wound vac Required Braces or Orthoses: Other Brace Other Brace: L limb protector Restrictions Weight Bearing Restrictions: Yes LLE Weight Bearing: Non weight bearing Pain:  Pt reports 7/10 LLE phantom pain; RN aware and repositioned   Therapy/Group: Individual Therapy  Leroy Libman 09/10/2021, 9:29 AM

## 2021-09-10 NOTE — Progress Notes (Signed)
PROGRESS NOTE   Subjective/Complaints:  Pain controlled- although having intermittent pains; but says overall, pain "all right".   LBM yesterday.    ROS:  Pt denies SOB, abd pain, CP, N/V/C/D, and vision changes   Objective:   No results found. No results for input(s): "WBC", "HGB", "HCT", "PLT" in the last 72 hours.   No results for input(s): "NA", "K", "CL", "CO2", "GLUCOSE", "BUN", "CREATININE", "CALCIUM" in the last 72 hours.    Intake/Output Summary (Last 24 hours) at 09/10/2021 0925 Last data filed at 09/10/2021 0731 Gross per 24 hour  Intake 1293 ml  Output 1775 ml  Net -482 ml        Physical Exam: Vital Signs Blood pressure 123/66, pulse 61, temperature 98.3 F (36.8 C), temperature source Oral, resp. rate 16, height '6\' 2"'$  (1.88 m), weight 84.6 kg, SpO2 96 %.        General: awake, alert, appropriate, NAD HENT: conjugate gaze; oropharynx moist CV: regular rate; no JVD Pulmonary: CTA B/L; no W/R/R- good air movement GI: soft, NT, ND, (+)BS Psychiatric: appropriate Neurological: poor memory, but appropriate in interactions socially Musculoskeletal:     Comments: Left BK stump bulbous.tender -wearing shrinker and limb guard- VAC off- dressing C/D/I- localized erythema Skin:    General: Skin is warm and dry.     Comments: Wound vac removed 7/8, staples present at incision site, CDI.  Dry dressing with Kerlix present. Onychomycosis of right toenails.   Neurological:     Mental Status: He is alert and oriented to person, place, and time.     Cranial Nerves: No cranial nerve deficit.     Comments: Oriented to self and situation--place-still need cues for month. CN exam intact. UE motor 5/5. RLE 4/5. LLE 3/5 HF, KE/KF. Decreased LT distal RLE below ankles   Assessment/Plan: 1. Functional deficits which require 3+ hours per day of interdisciplinary therapy in a comprehensive inpatient rehab  setting. Physiatrist is providing close team supervision and 24 hour management of active medical problems listed below. Physiatrist and rehab team continue to assess barriers to discharge/monitor patient progress toward functional and medical goals  Care Tool:  Bathing    Body parts bathed by patient: Right arm, Left arm, Chest, Abdomen, Front perineal area, Right upper leg, Left upper leg, Face, Right lower leg   Body parts bathed by helper: Right lower leg Body parts n/a: Left lower leg   Bathing assist Assist Level: Minimal Assistance - Patient > 75%     Upper Body Dressing/Undressing Upper body dressing   What is the patient wearing?: Pull over shirt    Upper body assist Assist Level: Set up assist    Lower Body Dressing/Undressing Lower body dressing      What is the patient wearing?: Pants     Lower body assist Assist for lower body dressing: Contact Guard/Touching assist     Toileting Toileting    Toileting assist Assist for toileting: Contact Guard/Touching assist     Transfers Chair/bed transfer  Transfers assist     Chair/bed transfer assist level: Contact Guard/Touching assist     Locomotion Ambulation   Ambulation assist   Ambulation activity did not  occur: Safety/medical concerns  Assist level: Contact Guard/Touching assist Assistive device: Walker-rolling Max distance: 22   Walk 10 feet activity   Assist  Walk 10 feet activity did not occur: Safety/medical concerns  Assist level: Contact Guard/Touching assist Assistive device: Walker-rolling   Walk 50 feet activity   Assist Walk 50 feet with 2 turns activity did not occur: Safety/medical concerns         Walk 150 feet activity   Assist Walk 150 feet activity did not occur: Safety/medical concerns         Walk 10 feet on uneven surface  activity   Assist Walk 10 feet on uneven surfaces activity did not occur: Safety/medical concerns          Wheelchair     Assist Is the patient using a wheelchair?: Yes Type of Wheelchair: Manual Wheelchair activity did not occur: Refused  Wheelchair assist level: Supervision/Verbal cueing Max wheelchair distance: 200'    Wheelchair 50 feet with 2 turns activity    Assist    Wheelchair 50 feet with 2 turns activity did not occur: Refused   Assist Level: Supervision/Verbal cueing   Wheelchair 150 feet activity     Assist  Wheelchair 150 feet activity did not occur: Refused   Assist Level: Supervision/Verbal cueing   Blood pressure 123/66, pulse 61, temperature 98.3 F (36.8 C), temperature source Oral, resp. rate 16, height '6\' 2"'$  (1.88 m), weight 84.6 kg, SpO2 96 %.  Medical Problem List and Plan: 1. 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   D/c 7/18  Con't CIR- PT and OT- per SW, going to [facility] to stay with other family member- they are moving him there and will arrange for new PCP.  2.  Antithrombotics: -DVT/anticoagulation:  Pharmaceutical: Other (comment)--Eliquis             -antiplatelet therapy:  N/A 3. Pain Management: Oxycodone prn             -add gabapentin '100mg'$  TID for phantom limb pain  7/7- will d/c Gabapentin due ot memory issues and add Lyrica 50 mg TID; also started Cymbalta yesterday- no nausea so far-  7/9 No side effects from Cymbalta, continue Lyrica 50 mg TID  7/10- no side effects- will increase Cymbalta as of the AM to 60 mg daily.   7/11- no side effects with increase in cymbalta.   7/12- also getting mirror therapy -feels that's somewhat helpful-   7/13- pain doing better per pt- con't regimen             -discussed massage and desensitization 4. Mood/Sleep: LCSW to follow for evaluation and support.               -antipsychotic agents: N/A 5. Neuropsych/cognition: This patient is capable of making decisions on his own behalf. 6. Skin/Wound Care: Continue wound VAC for 7 days post-op  7/6- VAC to be d/c'd 09/05/21  7/7- wrote order to d/c [facility] tomorrow if not already off.  7/8 Wound vac removed this afternoon by nurse.  7/9 Wound vac removed 7/8, staples present at incision site, CDI.  Dry dressing with Kerlix present.  7/11- con't shrinker  7/12- educated on to prevent hip/knee contractures- will con't to do so, since poor memory. Wearing limb guard 7. Fluids/Electrolytes/Nutrition: Has been on IVF since admission-->d/c.  --Check CMET in am             --encourage intake. Will change glucerna to TID with meals to avoid BS spikes             --continue Juven, Vitamin C and Zinc to promote wound healing.  8. A fib: Monitor HR TID--currently controlled on Cardizem 120 mg BID and metoprolol 50 mg bid             --on Eliquis w/ H/H stable 9. T2DM: Hgb A1c-7.9-- 10.7 in 4 months likely due to infection/now controlled.   -- Mild DKA-->beta hydroxybutyric acid 3.55-->0.91 --will continue to monitor BS ac/hs and titrate meds as indicated.              --Was on Metformin 1000 mg bid at home             --continue Levemir (increased to 15 u BID 07/04) w/ 4 units novolog  ac TID. Titrate as indicated.  7/5- Cbg's 200-300s- just got increase yesterday- will add metformin 500 mg BID back to regimen.   7/6- CBG's look better this AM- 178- with addition of Metformin- will monitor and make changes tomorrow as required  7/7- CBG's- mid to upper 100s- will increase metformin to 850 mg BID and monitor CBG's.  7/8 Continue to monitor as change to metformin dose still new. 7/9 CBG's 132-161 today.  7/13- CBGs controlled- need to make sure pt knows how to give self insulin at d/c- teach insulin- will place order for this CBG (last 3)  Recent Labs    09/09/21 2036 09/09/21 2250 09/10/21 0639  GLUCAP 133* 113* 76     10. Depressed systolic LVF: Started on Sacubitril-valsartan bid on 07/02. -Monitor renal function              -daily weights 11. ABLA/anemia of chronic illness: Monitor H/H with serial checks. 7/6- Hb 11.6- will monitor to monitor- doesn't want iron at this time.  7/10- Hb 10.90 overall stable, but will monitor trend.  12. Hypomagnesemia: Improved post IV supplement and on oral supplement -likely due to malnutrition. Will recheck in am.   7/5- Mg level is 1.9- will con't to monitor 13. Chronic hyponatremia: Improving. Will recheck Na level in am.    7/5- Na is 134- will monitor for trends.  7/10- Na 137 14. Constipation 7/7- gave sorbitol without a BM- will order soap suds enema today if no BM by 3pm- pt still feels constipated  7/12- LBM yesterday- going well- con't regimen     LOS: 9 days A FACE TO FACE EVALUATION WAS PERFORMED  Randall Coleman 09/10/2021, 9:25 AM

## 2021-09-11 DIAGNOSIS — I519 Heart disease, unspecified: Secondary | ICD-10-CM

## 2021-09-11 DIAGNOSIS — D481 Neoplasm of uncertain behavior of connective and other soft tissue: Secondary | ICD-10-CM

## 2021-09-11 DIAGNOSIS — R4189 Other symptoms and signs involving cognitive functions and awareness: Secondary | ICD-10-CM

## 2021-09-11 DIAGNOSIS — K5900 Constipation, unspecified: Secondary | ICD-10-CM

## 2021-09-11 DIAGNOSIS — I429 Cardiomyopathy, unspecified: Secondary | ICD-10-CM

## 2021-09-11 LAB — GLUCOSE, CAPILLARY
Glucose-Capillary: 105 mg/dL — ABNORMAL HIGH (ref 70–99)
Glucose-Capillary: 170 mg/dL — ABNORMAL HIGH (ref 70–99)
Glucose-Capillary: 92 mg/dL (ref 70–99)
Glucose-Capillary: 123 mg/dL — ABNORMAL HIGH (ref 70–99)

## 2021-09-11 MED ORDER — METFORMIN HCL 500 MG PO TABS
1000.0000 mg | ORAL_TABLET | Freq: Two times a day (BID) | ORAL | Status: DC
  Administered 2021-09-11 – 2021-09-15 (×8): 1000 mg via ORAL
  Filled 2021-09-11 (×8): qty 2

## 2021-09-11 MED ORDER — SENNOSIDES-DOCUSATE SODIUM 8.6-50 MG PO TABS: 2.0000 | ORAL_TABLET | Freq: Two times a day (BID) | ORAL | 0 refills | Status: AC

## 2021-09-11 MED ORDER — TRAMADOL HCL 50 MG PO TABS: 50.0000 mg | ORAL_TABLET | Freq: Four times a day (QID) | ORAL | 0 refills | Status: AC | PRN

## 2021-09-11 MED ORDER — DULOXETINE HCL 60 MG PO CPEP: 60.0000 mg | ORAL_CAPSULE | Freq: Every day | ORAL | 0 refills | Status: AC

## 2021-09-11 MED ORDER — ACETAMINOPHEN 325 MG PO TABS
325.0000 mg | ORAL_TABLET | ORAL | 0 refills | Status: AC | PRN
Start: 2021-09-11 — End: ?

## 2021-09-11 MED ORDER — DOCUSATE SODIUM 100 MG PO CAPS: 100.0000 mg | ORAL_CAPSULE | Freq: Every day | ORAL | 0 refills | Status: AC

## 2021-09-11 MED ORDER — OXYCODONE HCL 10 MG PO TABS: 10.0000 mg | ORAL_TABLET | Freq: Four times a day (QID) | ORAL | 0 refills | Status: AC | PRN

## 2021-09-11 MED ORDER — METFORMIN HCL 1000 MG PO TABS: 1000.0000 mg | ORAL_TABLET | Freq: Two times a day (BID) | ORAL | 0 refills | Status: AC

## 2021-09-11 MED ORDER — METOPROLOL TARTRATE 25 MG PO TABS: 25.0000 mg | ORAL_TABLET | Freq: Two times a day (BID) | ORAL | 0 refills | Status: AC

## 2021-09-11 MED ORDER — DILTIAZEM HCL ER 90 MG PO CP12: 90.0000 mg | ORAL_CAPSULE | Freq: Two times a day (BID) | ORAL | 0 refills | Status: AC

## 2021-09-11 MED ORDER — VITAMIN C 500 MG PO TABS: 1000.0000 mg | ORAL_TABLET | Freq: Two times a day (BID) | ORAL | 0 refills | Status: AC

## 2021-09-11 MED ORDER — POLYETHYLENE GLYCOL 3350 17 GM/SCOOP PO POWD: 17.0000 g | Freq: Every day | ORAL | 0 refills | Status: AC | PRN

## 2021-09-11 MED ORDER — MAGNESIUM OXIDE -MG SUPPLEMENT 400 (240 MG) MG PO TABS: 400.0000 mg | ORAL_TABLET | Freq: Two times a day (BID) | ORAL | 0 refills | Status: AC

## 2021-09-11 MED ORDER — JUVEN PO PACK: 1.0000 | PACK | Freq: Two times a day (BID) | ORAL | 0 refills | Status: AC

## 2021-09-11 MED ORDER — METOPROLOL TARTRATE 25 MG PO TABS
25.0000 mg | ORAL_TABLET | Freq: Two times a day (BID) | ORAL | Status: DC
  Administered 2021-09-11 – 2021-09-14 (×4): 25 mg via ORAL
  Filled 2021-09-11 (×6): qty 1

## 2021-09-11 MED ORDER — PREGABALIN 50 MG PO CAPS: 50.0000 mg | ORAL_CAPSULE | Freq: Three times a day (TID) | ORAL | 0 refills | Status: AC

## 2021-09-11 NOTE — Progress Notes (Signed)
[facility] pharmacy to confirm receive of Rx as well as change in metoprolol and Cardizem doses. Informed them that patient will be moving to [place] and family will be by on Monday to pick up the meds. Was transferred from pharmacy to repeat the above information with PCP front desk. They will send the information to his primary care provider to handle the issue?

## 2021-09-11 NOTE — Progress Notes (Addendum)
Patient ID: Randall Coleman, male   DOB: 07-15-1950, 71 y.o.   MRN: [id number]  SW received message from [name] reporting they are still exploring PCPs at this time and will follow-up once one is secured.  Randall Coleman VA SW submitted referral for  the following traveling [facility] request- Veteran to also be connected with amputee clinic and physiatry at the local [place] in [place] and PCP.   SW spoke with [name] to discuss above. Reports she is still exploring and having a hard time getting him in sooner. States her home set up is a one level villa no steps to enter and his bathroom has a tub shower. When discussing DME delivery to home and when they will fly out. States the plan is for them to fly out on Tuesday after he is discharged. SW sent email to Randall Coleman to see if DME can be shipped to [place] address. SW also discussed PA-Randall Coleman reports of meds being delivered to the home as ordered through [place] and asked if someone can look into this. States his brother and her husband will not be there until Monday. She will ask the landlord if she can be on the lookout for this as well.   VA SW Randall Coleman reported traveling [place] manager that the PCP cannot place a traveling veteran consult for Randall Coleman while he is still inpatient, however, can ask PCP the to place the traveling [facility] consult to coordinate primary care needs at the [facility] and one of their satellite clinics when he discharges on 09/15/2021. She requested clinicals for other specialty services needed. SW requested physiatry, ortho and cardiology. PA will put all recommended follow-up referrals in discharge instructions.   SW updated [name] on above, and indicated will work on a plan for meds and DME. Reports new [place] clinic- [facility] is near their home if the referral can be sent there. Otherwise, willing to take pt to [facility] clinic if needed.   1315- SW spoke with VA SW Randall Coleman to discuss coordination of care. Reports  pharmacy will have meds mailed to home in [place], and pt can pick up pain meds at [facility]. Since family picking  up, will need to bring in patient's ID in order to pick up. Reports DME is pending at this time, and working on having w/c delivered to his hospital room and other items to be delivered to the home. States submitted specialty referrals, and encouraged family to call [facility] clinic once discharged to discuss scheduling PCP appointment. For further questions about transition of care, family encouraged to call [facility] office. She will follow-up to confirm DME.  *SW shared above with [name].She will follow-up once PCP appt in place.   1340-SW received phone call from Randall Coleman/ [employer] [facility] who wanted to meet with pt to discuss relinquishing his [employer] voucher through the [place]. States this voucher is not able to be transferred given the high demand in this area. Reports she will come by and visit pt on Monday. SW encouraged her to follow-up with [name] due to memory deficits.   Randall Coleman, MSW, [place] Office: [phone] Cell: [phone] Fax: [phone]

## 2021-09-11 NOTE — Plan of Care (Signed)
  Problem: RH Ambulation Goal: LTG Patient will ambulate in home environment (PT) Description: LTG: Patient will ambulate in home environment, # of feet with assistance (PT). Flowsheets Taken 09/11/2021 1554 by Ailene Rud C, PT LTG: Pt will ambulate in home environ  assist needed:: (downgraded d/t slow progress) Minimal Assistance - Patient > 75% Taken 09/02/2021 1645 by Excell Seltzer, PT LTG: Ambulation distance in home environment: 25 ft with LRAD

## 2021-09-11 NOTE — Discharge Summary (Addendum)
Physician Discharge Summary  Patient ID: Randall Coleman MRN: [id number] DOB/AGE: 1950/08/26 71 y.o.  Admit date: 09/01/2021 Discharge date: 09/15/2021  Discharge Diagnoses:  Principal Problem:   S/P BKA (below knee amputation), left (HCC) Active Problems:   Essential hypertension   History of DVT of lower extremity   DKA, type 2 (HCC)   Atrial fibrillation (HCC)   Protein-calorie malnutrition, severe   Abdominal fibromatosis   Cardiomyopathy ([place])   Cognitive impairment   Constipation   Decreased left ventricular function   Discharged Condition: stable  Significant Diagnostic Studies: ECHOCARDIOGRAM COMPLETE  Result Date: 08/26/2021    ECHOCARDIOGRAM REPORT   Patient Name:   Randall Coleman Date of Exam: 08/26/2021 Medical Rec #:  [id number]      Height:       74.0 in Accession #:    [id number]     Weight:       185.0 lb Date of Birth:  1951/01/14       BSA:          2.102 m Patient Age:    46 years       BP:           91/62 mmHg Patient Gender: M              HR:           90 bpm. Exam Location:  Inpatient Procedure: 2D Echo, Cardiac Doppler and Color Doppler Indications:    Atrial Fibrillation  History:        Patient has prior history of Echocardiogram examinations, most                 recent 12/10/2018. Arrythmias:Atrial Fibrillation; Risk                 Factors:Hypertension and Diabetes.  Sonographer:    Jefferey Pica Referring Phys: [id number] [place]  1. Left ventricular ejection fraction, by estimation, is 40 to 45%. The left ventricle has mildly decreased function. The left ventricle demonstrates global hypokinesis. There is mild left ventricular hypertrophy. Left ventricular diastolic parameters are indeterminate.  2. Right ventricular systolic function is normal. The right ventricular size is mildly enlarged. There is mildly elevated pulmonary artery systolic pressure.  3. Left atrial size was moderately dilated.  4. Right atrial size was mildly dilated.  5. The  mitral valve is normal in structure. Trivial mitral valve regurgitation. No evidence of mitral stenosis.  6. The aortic valve is normal in structure. Aortic valve regurgitation is not visualized. No aortic stenosis is present.  7. The inferior vena cava is normal in size with greater than 50% respiratory variability, suggesting right atrial pressure of 3 mmHg. Comparison(s): The left ventricular function is worsened. FINDINGS  Left Ventricle: Left ventricular ejection fraction, by estimation, is 40 to 45%. The left ventricle has mildly decreased function. The left ventricle demonstrates global hypokinesis. The left ventricular internal cavity size was normal in size. There is  mild left ventricular hypertrophy. Left ventricular diastolic parameters are indeterminate. Right Ventricle: The right ventricular size is mildly enlarged. No increase in right ventricular wall thickness. Right ventricular systolic function is normal. There is mildly elevated pulmonary artery systolic pressure. The tricuspid regurgitant velocity is 2.36 m/s, and with an assumed right atrial pressure of 15 mmHg, the estimated right ventricular systolic pressure is 09.7 mmHg. Left Atrium: Left atrial size was moderately dilated. Right Atrium: Right atrial size was mildly dilated. Pericardium: There is no evidence of pericardial effusion. Mitral Valve:  The mitral valve is normal in structure. Trivial mitral valve regurgitation. No evidence of mitral valve stenosis. Tricuspid Valve: The tricuspid valve is normal in structure. Tricuspid valve regurgitation is mild . No evidence of tricuspid stenosis. Aortic Valve: The aortic valve is normal in structure. Aortic valve regurgitation is not visualized. No aortic stenosis is present. Aortic valve peak gradient measures 4.3 mmHg. Pulmonic Valve: The pulmonic valve was normal in structure. Pulmonic valve regurgitation is not visualized. No evidence of pulmonic stenosis. Aorta: The aortic root is normal  in size and structure. Venous: The inferior vena cava is normal in size with greater than 50% respiratory variability, suggesting right atrial pressure of 3 mmHg. IAS/Shunts: No atrial level shunt detected by color flow Doppler.  LEFT VENTRICLE PLAX 2D LVIDd:         3.90 cm LVIDs:         3.10 cm LV PW:         1.20 cm LV IVS:        1.20 cm LVOT diam:     2.40 cm LV SV:         70 LV SV Index:   33 LVOT Area:     4.52 cm  RIGHT VENTRICLE          IVC RV Basal diam:  3.60 cm  IVC diam: 2.90 cm RV Mid diam:    4.10 cm LEFT ATRIUM              Index        RIGHT ATRIUM           Index LA diam:        4.60 cm  2.19 cm/m   RA Area:     23.60 cm LA Vol (A2C):   96.9 ml  46.09 ml/m  RA Volume:   76.50 ml  36.39 ml/m LA Vol (A4C):   103.0 ml 48.99 ml/m LA Biplane Vol: 100.0 ml 47.56 ml/m  AORTIC VALVE                 PULMONIC VALVE AV Area (Vmax): 3.69 cm     PV Vmax:       0.75 m/s AV Vmax:        103.16 cm/s  PV Peak grad:  2.3 mmHg AV Peak Grad:   4.3 mmHg LVOT Vmax:      84.20 cm/s LVOT Vmean:     55.925 cm/s LVOT VTI:       0.154 m  AORTA Ao Root diam: 3.70 cm Ao Asc diam:  3.40 cm TRICUSPID VALVE TR Peak grad:   22.3 mmHg TR Vmax:        236.00 cm/s  SHUNTS Systemic VTI:  0.15 m Systemic Diam: 2.40 cm Candee Furbish MD Electronically signed by Candee Furbish MD Signature Date/Time: 08/26/2021/12:52:09 PM    Final    DG Foot 2 Views Left  Result Date: 08/25/2021 CLINICAL DATA:  Surgery on foot tomorrow for toe removal.  Pain. EXAM: LEFT FOOT - 2 VIEW COMPARISON:  08/07/2021 FINDINGS: Prior resection of the first and second phalanges. Soft tissue swelling and minimal soft tissue gas about the medial forefoot. Ill definition of the second distal metatarsal is relatively similar. There is osseous irregularity involving the distal third phalanx with increased fragmentation. The middle phalanx of the third digit demonstrates osteolysis, new. Small calcaneal spur. IMPRESSION: Findings suspicious for osteomyelitis  involving the distal third digit, progressive. Cannot exclude osteomyelitis involving the distal portion of  the second metatarsal. Electronically Signed   By: Abigail Miyamoto M.D.   On: 08/25/2021 12:07   DG Chest Port 1 View  Result Date: 08/25/2021 CLINICAL DATA:  Sepsis. EXAM: PORTABLE CHEST 1 VIEW COMPARISON:  04/18/2021 FINDINGS: The heart size and mediastinal contours are within normal limits. Both lungs are clear. IMPRESSION: No active disease. Electronically Signed   By: Marlaine Hind M.D.   On: 08/25/2021 12:03    Labs:  Basic Metabolic Panel:    Latest Ref Rng & Units 09/14/2021    6:14 AM 09/07/2021    5:48 AM 09/02/2021    5:13 AM  BMP  Glucose 70 - 99 mg/dL 69  115  206   BUN 8 - 23 mg/dL '21  26  10   '$ Creatinine 0.61 - 1.24 mg/dL 0.63  0.72  0.66   Sodium 135 - 145 mmol/L 138  137  134   Potassium 3.5 - 5.1 mmol/L 4.1  4.1  4.2   Chloride 98 - 111 mmol/L 104  97  100   CO2 22 - 32 mmol/L '26  27  27   '$ Calcium 8.9 - 10.3 mg/dL 9.0  8.9  8.5      CBC:    Latest Ref Rng & Units 09/14/2021    6:14 AM 09/07/2021    5:48 AM 09/02/2021    5:13 AM  CBC  WBC 4.0 - 10.5 K/uL 5.4  6.6  7.5   Hemoglobin 13.0 - 17.0 g/dL 10.9  10.9  11.6   Hematocrit 39.0 - 52.0 % 32.7  33.5  34.5   Platelets 150 - 400 K/uL 204  269  309      CBG: Recent Labs  Lab 09/14/21 1216 09/14/21 1632 09/14/21 1640 09/14/21 1948 09/14/21 2246  GLUCAP 93 100* 106* 115* 55    Brief HPI:   Randall Coleman is a 71 y.o. male with history of DVT, T2DM, HTN, A-fib, cognitive decline (per records), progressive ulceration with drainage from left third toe with osteomyelitis and plans for 3rd-5th toe amputation.  However he was admitted on 08/25/2021 with increasing pain, swelling, foul odorous drainage as well as worsening of cognition.  He was noted, was in mild DKA and had A-fib with RVR.  He was started on as well as broad-spectrum antibiotics and IV fluids with improvement.  Cardiology was consulted for management  of A-fib and 2D echo showed reduced LV function therefore he was started on GDMT.  Once medically optimized, he underwent left-BKA on 07/01 by Dr. Sharol Given.  Postop had some issues with bleeding from the wound as well as Foley controlled blood sugars and hypomagnesemia in setting of severe malnutrition.  He received IV magnesium and was started on oral supplement additionally.  Therapy was working with patient who continued to be limited by weakness, pain, cognitive impairments as well as fatigue with anxiety.  CIR was recommended due to functional decline.      Hospital Course: Randall Coleman was admitted to rehab 09/01/2021 for inpatient therapies to consist of PT and OT at least three hours five days a week. Past admission physiatrist, therapy team and rehab RN have worked together to provide customized collaborative inpatient rehab.  Blood pressures and heart rate were monitored on 3 times daily basis.  Heart rate noted to be trending down in the low 50s therefore metoprolol was decreased to 25 mg twice daily.     Nutritional supplements were added to help promote wound healing.  Hypomagnesemia has resolved  with supplementation.  Gabapentin was added to help with phantom pain but discontinued due to memory issues.  Lyrica was added in addition to Cymbalta with improvement in symptom control.  Oxycodone has been used on as needed basis for pain.  His diabetes has been monitored with ac/hs CBG checks and SSI was use prn for tighter BS control.  Metformin was resumed and titrated to home dose.  Levemir was discontinued and blood sugars are currently noted to be reasonably controlled.    Rehab course: During patient's stay in rehab weekly team conferences were held to monitor patient's progress, set goals and discuss barriers to discharge. At admission, patient required mod to max assist with ADL tasks and mod assist with mobility.  He exhibited mild deficits in processing and memory as well as poor  motivation.  No speech therapy gains during his stay as cognition was felt to be at baseline. He  has had improvement in activity tolerance, balance, postural control as well as ability to compensate for deficits.        Disposition: Home  Diet: Heart healthy/Diabetic.   Special Instructions: Wash incision with soap and water. Keep clean and dry.  No driving smoking or alcohol  Medications at discharge 1.  Tylenol as needed 2.  Eliquis 5 mg p.o. twice daily 3.  Vitamin C 500 mg p.o. twice daily 4.  Cardizem SR 90 mg every 12 hours 5.  Colace 100 mg p.o. daily 6.  Cymbalta 60 mg p.o. nightly 7.  Magnesium oxide 400 mg p.o. twice daily 8.  Glucophage 1000 mg p.o. twice daily 9.  Lopressor 25 mg p.o. twice daily 10.  Multivitamin daily 11.  NicoDerm patch taper as directed 12.  Oxycodone 10 every 6 hours as needed pain 13.  Protonix 40 mg p.o. daily 14.  Lyrica 50 mg p.o. 3 times daily 15  Entresto 24-26 mg 1 p.o. twice daily 16.  Tramadol 50 mg every 4 hours as needed pain 17.  MiraLAX daily as needed  30-35 minutes were spent completing discharge summary and discharge planning       Allergies as of 09/15/2021   No Known Allergies      Medication List     STOP taking these medications    bisacodyl 5 MG EC tablet Generic drug: bisacodyl   guaiFENesin-dextromethorphan 100-10 MG/5ML syrup Commonly known as: ROBITUSSIN DM   insulin aspart 100 UNIT/ML injection Commonly known as: novoLOG   insulin detemir 100 UNIT/ML injection Commonly known as: LEVEMIR       TAKE these medications    acetaminophen 325 MG tablet Commonly known as: TYLENOL Take 1-2 tablets (325-650 mg total) by mouth every 4 (four) hours as needed for mild pain. What changed:  medication strength how much to take when to take this reasons to take this   apixaban 5 MG Tabs tablet Commonly known as: ELIQUIS Take 1 tablet (5 mg total) by mouth 2 (two) times daily.   diltiazem 90 MG  12 hr capsule Commonly known as: CARDIZEM SR Take 1 capsule (90 mg total) by mouth every 12 (twelve) hours. What changed:  medication strength how much to take   docusate sodium 100 MG capsule Commonly known as: COLACE Take 1 capsule (100 mg total) by mouth daily.   DULoxetine 60 MG capsule Commonly known as: CYMBALTA Take 1 capsule (60 mg total) by mouth at bedtime.   magnesium oxide 400 (240 Mg) MG tablet Commonly known as: MAG-OX Take 1 tablet (400 mg  total) by mouth 2 (two) times daily.   metFORMIN 1000 MG tablet Commonly known as: GLUCOPHAGE Take 1 tablet (1,000 mg total) by mouth 2 (two) times daily with a meal.   metoprolol tartrate 25 MG tablet Commonly known as: LOPRESSOR Take 1 tablet (25 mg total) by mouth 2 (two) times daily. What changed:  medication strength how much to take   multivitamin with minerals Tabs tablet Take 1 tablet by mouth every morning.   nicotine 21 mg/24hr patch Commonly known as: NICODERM CQ - dosed in mg/24 hours Place 1 patch (21 mg total) onto the skin daily.   nutrition supplement (JUVEN) Pack Take 1 packet by mouth 2 (two) times daily. What changed: Another medication with the same name was removed. Continue taking this medication, and follow the directions you see here.   Oxycodone HCl 10 MG Tabs Take 1 tablet (10 mg total) by mouth every 6 (six) hours as needed for severe pain. What changed:  medication strength how much to take when to take this reasons to take this Another medication with the same name was removed. Continue taking this medication, and follow the directions you see here.   pantoprazole 40 MG tablet Commonly known as: PROTONIX Take 1 tablet (40 mg total) by mouth daily.   polyethylene glycol powder 17 GM/SCOOP powder Commonly known as: GLYCOLAX/MIRALAX Take 17 g by mouth daily as needed for mild constipation.   pregabalin 50 MG capsule Commonly known as: LYRICA Take 1 capsule (50 mg total) by mouth 3  (three) times daily.   sacubitril-valsartan 24-26 MG Commonly known as: ENTRESTO Take 1 tablet by mouth 2 (two) times daily.   senna-docusate 8.6-50 MG tablet Commonly known as: Senokot-S Take 2 tablets by mouth 2 (two) times daily.   traMADol 50 MG tablet Commonly known as: ULTRAM Take 1 tablet (50 mg total) by mouth 4 (four) times daily as needed for moderate pain.   vitamin C 500 MG tablet Commonly known as: ASCORBIC ACID Take 2 tablets (1,000 mg total) by mouth 2 (two) times daily. What changed:  medication strength when to take this   zinc sulfate 220 (50 Zn) MG capsule Take 1 capsule (220 mg total) by mouth daily.        Follow-up Information     Lovorn, Jinny Blossom, MD Follow up.   Specialty: Physical Medicine and Rehabilitation Contact information: [address] [place] [address] [phone]         Charolette Forward, MD Follow up.   Specialty: Cardiology Why: Call in 1-2 days for post hospital follow up Contact information: [address] [place] [address] [phone]         [name], MD Follow up in 2 week(s).   Specialty: Orthopedic Surgery Why: Call the office for a follow-up appointment. Contact information: [address] [place] [address] [phone]                 Signed: Lavon Paganini Bristow 09/15/2021, 5:43 AM

## 2021-09-11 NOTE — Progress Notes (Signed)
Occupational Therapy Session Note  Patient Details  Name: Randall Coleman MRN: [id number] Date of Birth: 04/22/1950  Today's Date: 09/11/2021 OT Individual Time: 3734-2876 & 1530-1630 OT Individual Time Calculation (min): 60 min & 60 min   Short Term Goals: Week 1:  OT Short Term Goal 1 (Week 1): Pt will complete toileting with minimal assistance. OT Short Term Goal 1 - Progress (Week 1): Progressing toward goal OT Short Term Goal 2 (Week 1): Pt wil complete UB dressing with set-up assistance. OT Short Term Goal 2 - Progress (Week 1): Met OT Short Term Goal 3 (Week 1): Pt will complete LB dressing with minimal assistance using LRAD and AE as needed. OT Short Term Goal 3 - Progress (Week 1): Met OT Short Term Goal 4 (Week 1): Pt will complete bathing tasks with supervison using AE and DME. OT Short Term Goal 4 - Progress (Week 1): Progressing toward goal OT Short Term Goal 5 (Week 1): Pt will complete toilet transfer with minimal assistance using LRAD. OT Short Term Goal 5 - Progress (Week 1): Met  Skilled Therapeutic Interventions/Progress Updates:    Session 1: Upon OT arrival, pt semi recumbent in bed. Pt reports no pain and is agreeable to OT treatment session. Treatment session with a focus on self care retraining, higher level IADLs, and functional transfer training. Pt completes supine to sit transfer with Mod I and completes sit pivot transfer into w/c with Supervision. Pt transports himself via w/c to sink with Supervision . Pt completes oral care and grooming at the levels stated below. Pt propels himself to rehab apartment via w/c and Mod I. Pt makes bed using flat sheet, blanket, and 3 pillow cases. Pt without difficulty and able to maneuver Mod I. Increased time required to complete. Pt propels himself to tub room to engage in transfer training using tub bench for safe functional transfers. Pt completes 3 times requiring verbal cues and completes with CGA. Pt verbalizes understanding  for recommendation of tub transfer bench for safety purposes. Pt propels himself to his room Mod I and completes sit pivot transfer with Supervision to change his shirt, wash his peri areas seated EOB and change pants at the levels listed below. Pt limited primarily by decreased memory and safety awareness and continues to benefit from OT services to achieve highest level of independence. Pt completes sit to supine transfer with Mod I and was left in bed with all needs in met and safety measures in place.    Session 2: Upon OT arrival, pt supine in bed and requesting pain medication. Pt reports 8/10 pain to L LE. Pt agreeable to OT treatment session. OT intervention with a focus on strengthening, endurance training, and problem solving. Pt completes supine to sit transfer with Mod I and sits EOB with SBA to complete B UE exercises using 4lb dumbbell. Pt completes shoulder flex/ext, shoulder abd/add, elbow flex/ext, chest press, overhead press, supination/pronation, and wrist flex/ext. Pt requires short rest breaks throughout and verbal cues to correct form. Pt completes sit pivot transfer into w/c with SBA and propels himself to main therapy gym via w/c and supervision. Pt sits at tabletop to replicate 2 graphic designs of colored pegs into peg board using B UE demonstrating mod difficulty requiring occasional verbal cues to recognize errors and correct. Pt requires increased time to complete. While seated in w/c pt engaged in ball toss using B UE with this therapist. Pt able to tolerate tossing ~3 minutes without a rest break. Pt propels  himself back to his room with supervision via w/c and was left in w/c at end of session with all needs met. Nurse tech present at end of session.   Therapy Documentation Precautions:  Precautions Precautions: Fall Precaution Comments: wound vac Required Braces or Orthoses: Other Brace Other Brace: L limb protector Restrictions Weight Bearing Restrictions: Yes LLE  Weight Bearing: Non weight bearing  ADL: ADL Grooming: Supervision/safety Where Assessed-Grooming: Sitting at sink Lower Body Bathing: Supervision/safety Where Assessed-Lower Body Bathing: Edge of bed Upper Body Dressing: Independent Where Assessed-Upper Body Dressing: Wheelchair Lower Body Dressing: Supervision/safety Where Assessed-Lower Body Dressing: Edge of bed Tub/Shower Transfer: Contact guard Tub/Shower Transfer Method: Sit pivot   Therapy/Group: Individual Therapy  Marvetta Gibbons 09/11/2021, 8:52 AM

## 2021-09-11 NOTE — Progress Notes (Signed)
Physical Therapy Session Note  Patient Details  Name: Randall Coleman MRN: [id number] Date of Birth: 1950-09-12  Today's Date: 09/11/2021 PT Individual Time: 0915-1030 PT Individual Time Calculation (min): 75 min   Short Term Goals: Week 2:  PT Short Term Goal 1 (Week 2): =LTG due to ELOS  Skilled Therapeutic Interventions/Progress Updates: Pt presented in bed agreeable to therapy. Pt states pain "ok"  at start of session, increasing towards end of session with nsg notified and pain meds provided at end of session. Pt requesting to use urinal prio to leaving room. Pt noted to have spilled uring onto shorts therefore PTA obtained paper scrubs, and pt was able to doff and don paper scrubs bed level with supervision. Performed squat pivot bed to w/c with close supervision. Pt propelled >38f to day room around to pt laundry room. Pt performed stand from w/c with CGA and took x 4 steps in laundry room to place clothes in washer. Pt was able to pull clothes out of bag sitting on dryer and place in washer. Pt then performed backwards hops to return to w/c with CGA. Pt propelled back to day room and performed stand pivot transfer to high/low mat with CGA. Pt participated in standing activities including several bouts of corn hole. Pt was able to maintain standing max time of approx 3 min before stating RLE felt "unsteady" and needed seated rest. Provided education to pt on importance of continued standing trials to improve standing tolerance and progressing endurance with pt verbalizing understanding. Pt was also able to tolerate standing therex including standing SLR and hip abd/add 2 x 10 then after seated rest standing hip ext 2 x 10. Pt then hopped ~542fto w/c and propelled back to room supervision. Performed squat pivot transfer to bed with supervision and returned to supine mod I. Pt left in bed at end of session with bed alarm on, call bell within reach and needs met.      Therapy  Documentation Precautions:  Precautions Precautions: Fall Precaution Comments: wound vac Required Braces or Orthoses: Other Brace Other Brace: L limb protector Restrictions Weight Bearing Restrictions: Yes LLE Weight Bearing: Non weight bearing General:   Vital Signs:  Pain:   Mobility:   Locomotion :    Trunk/Postural Assessment :    Balance:   Exercises:   Other Treatments:      Therapy/Group: Individual Therapy  Jaiden Dinkins 09/11/2021, 12:49 PM

## 2021-09-11 NOTE — Progress Notes (Signed)
PROGRESS NOTE   Subjective/Complaints:  LBM 2 days ago- doesn't want additional intervention until tomorrow. If doesn't go by Saturday at 3pm, suggest sorbitol.  Did get miralax this AM  Slow voiding, but able to pee and feels like emptying.  Forgot was going to [place] with family- but happy about it  ROS:  Pt denies SOB, abd pain, CP, N/V/ (+)C/D, and vision changes  Objective:   No results found. No results for input(s): "WBC", "HGB", "HCT", "PLT" in the last 72 hours.   No results for input(s): "NA", "K", "CL", "CO2", "GLUCOSE", "BUN", "CREATININE", "CALCIUM" in the last 72 hours.    Intake/Output Summary (Last 24 hours) at 09/11/2021 0834 Last data filed at 09/11/2021 0700 Gross per 24 hour  Intake 477 ml  Output 1625 ml  Net -1148 ml        Physical Exam: Vital Signs Blood pressure (!) 104/55, pulse (!) 50, temperature 98.3 F (36.8 C), temperature source Oral, resp. rate 14, height '6\' 2"'$  (1.88 m), weight 84.6 kg, SpO2 96 %.         General: awake, alert, appropriate, sitting u in bed; NAD HENT: conjugate gaze; oropharynx moist CV: bradycardic rate; no JVD Pulmonary: CTA B/L; no W/R/R- good air movement GI: soft, NT, ND, (+)BS- slightly hypoactive Psychiatric: appropriate- more interactive Neurological: poor memory Musculoskeletal:     Comments: Left BK stump bulbous.tender -wearing shrinker and limb guard- VAC off- dressing C/D/I- localized erythema Skin:    General: Skin is warm and dry.     Comments: Wound vac removed 7/8, staples present at incision site, CDI.  Dry dressing with Kerlix present. Onychomycosis of right toenails.   Neurological:     Mental Status: He is alert and oriented to person, place, and time.     Cranial Nerves: No cranial nerve deficit.     Comments: Oriented to self and situation--place-still need cues for month. CN exam intact. UE motor 5/5. RLE 4/5. LLE 3/5 HF,  KE/KF. Decreased LT distal RLE below ankles   Assessment/Plan: 1. Functional deficits which require 3+ hours per day of interdisciplinary therapy in a comprehensive inpatient rehab setting. Physiatrist is providing close team supervision and 24 hour management of active medical problems listed below. Physiatrist and rehab team continue to assess barriers to discharge/monitor patient progress toward functional and medical goals  Care Tool:  Bathing    Body parts bathed by patient: Right arm, Left arm, Chest, Abdomen, Front perineal area, Right upper leg, Left upper leg, Face, Right lower leg, Buttocks   Body parts bathed by helper: Right lower leg Body parts n/a: Left lower leg   Bathing assist Assist Level: Contact Guard/Touching assist     Upper Body Dressing/Undressing Upper body dressing   What is the patient wearing?: Pull over shirt    Upper body assist Assist Level: Independent    Lower Body Dressing/Undressing Lower body dressing      What is the patient wearing?: Pants     Lower body assist Assist for lower body dressing: Contact Guard/Touching assist     Toileting Toileting    Toileting assist Assist for toileting: Contact Guard/Touching assist     Transfers  Chair/bed transfer  Transfers assist     Chair/bed transfer assist level: Contact Guard/Touching assist     Locomotion Ambulation   Ambulation assist   Ambulation activity did not occur: Safety/medical concerns  Assist level: Contact Guard/Touching assist Assistive device: Walker-rolling Max distance: 20'   Walk 10 feet activity   Assist  Walk 10 feet activity did not occur: Safety/medical concerns  Assist level: Contact Guard/Touching assist Assistive device: Walker-rolling   Walk 50 feet activity   Assist Walk 50 feet with 2 turns activity did not occur: Safety/medical concerns         Walk 150 feet activity   Assist Walk 150 feet activity did not occur: Safety/medical  concerns         Walk 10 feet on uneven surface  activity   Assist Walk 10 feet on uneven surfaces activity did not occur: Safety/medical concerns         Wheelchair     Assist Is the patient using a wheelchair?: Yes Type of Wheelchair: Manual Wheelchair activity did not occur: Refused  Wheelchair assist level: Supervision/Verbal cueing Max wheelchair distance: 200'    Wheelchair 50 feet with 2 turns activity    Assist    Wheelchair 50 feet with 2 turns activity did not occur: Refused   Assist Level: Supervision/Verbal cueing   Wheelchair 150 feet activity     Assist  Wheelchair 150 feet activity did not occur: Refused   Assist Level: Supervision/Verbal cueing   Blood pressure (!) 104/55, pulse (!) 50, temperature 98.3 F (36.8 C), temperature source Oral, resp. rate 14, height '6\' 2"'$  (1.88 m), weight 84.6 kg, SpO2 96 %.  Medical Problem List and Plan: 1. 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   D/c 7/18  Con't CIR- PT and OT- per SW, going to [facility] to stay with other family member- they are moving him there and will arrange for new PCP.   Con't CIR= PT, and OT- no SLP since at baseline 2.  Antithrombotics: -DVT/anticoagulation:  Pharmaceutical: Other (comment)--Eliquis             -antiplatelet therapy:  N/A 3. Pain Management: Oxycodone prn             -add gabapentin '100mg'$  TID for phantom limb pain  7/7- will d/c Gabapentin due ot memory issues and add Lyrica 50 mg TID; also started Cymbalta yesterday- no nausea so far-  7/9 No side effects from Cymbalta, continue Lyrica 50 mg TID  7/10- no side effects- will increase Cymbalta as of the AM to 60 mg daily.   7/11- no side effects with increase in cymbalta.   7/12- also  getting mirror therapy -feels that's somewhat helpful-   7/13- pain doing better per pt- con't regimen             -discussed massage and desensitization 4. Mood/Sleep: LCSW to follow for evaluation and support.              -antipsychotic agents: N/A 5. Neuropsych/cognition: This patient is capable of making decisions on his own behalf. 6. Skin/Wound Care: Continue wound VAC for 7 days post-op  7/6- VAC to be d/c'd 09/05/21  7/7- wrote order to d/c [facility] tomorrow if not already off.  7/8 Wound vac removed this afternoon by nurse.  7/9 Wound vac removed 7/8, staples present at incision site, CDI.  Dry dressing with Kerlix present.  7/11- con't shrinker  7/12- educated on to prevent hip/knee contractures- will con't to do so, since poor memory. Wearing limb guard 7. Fluids/Electrolytes/Nutrition: Has been on IVF since admission-->d/c.  --Check CMET in am             --encourage intake. Will change glucerna to TID with meals to avoid BS spikes             --continue Juven, Vitamin C and Zinc to promote wound healing.  8. A fib: Monitor HR TID--currently controlled on Cardizem 120 mg BID and metoprolol 50 mg bid             --on Eliquis w/ H/H stable 9. T2DM: Hgb A1c-7.9-- 10.7 in 4 months likely due to infection/now controlled.   -- Mild DKA-->beta hydroxybutyric acid 3.55-->0.91 --will continue to monitor BS ac/hs and titrate meds as indicated.              --Was on Metformin 1000 mg bid at home             --continue Levemir (increased to 15 u BID 07/04) w/ 4 units novolog  ac TID. Titrate as indicated.  7/5- Cbg's 200-300s- just got increase yesterday- will add metformin 500 mg BID back to regimen.   7/6- CBG's look better this AM- 178- with addition of Metformin- will monitor and make changes tomorrow as required  7/7- CBG's- mid to upper 100s- will increase metformin to 850 mg BID and monitor CBG's.  7/8 Continue to monitor as change to metformin dose still new. 7/9 CBG's 132-161  today.  7/13- CBGs controlled- need to make sure pt knows how to give self insulin at d/c- teach insulin- will place order for this  7/14- CBGs look great- will ncrease metformin to 1000 mg BID and stop Novolog with meals 4 units with meals since wasn't on insulin at home prior- of note came in with mild DKA CBG (last 3)  Recent Labs    09/10/21 1630 09/10/21 2037 09/11/21 0622  GLUCAP 126* 98 105*    10. Depressed systolic LVF: Started on Sacubitril-valsartan bid on 07/02. -Monitor renal function              -daily weights 11. ABLA/anemia of chronic illness: Monitor H/H with serial checks. 7/6- Hb 11.6- will monitor to monitor- doesn't want iron at this time.  7/10- Hb 10.90 overall stable, but will monitor trend.  12. Hypomagnesemia: Improved post IV supplement and on oral supplement -likely due to malnutrition. Will recheck in am.   7/5- Mg level is 1.9- will con't to monitor 13. Chronic hyponatremia: Improving. Will recheck Na level in am.    7/5- Na is 134- will monitor for trends.  7/10- Na 137 14. Constipation 7/7- gave sorbitol without a BM- will order soap suds enema today if no BM by 3pm- pt still feels constipated  7/12- LBM yesterday- going well- con't regimen 7/14- LBM 2 days ago- if no BM by 3pm Saturday, give sorbitol  I spent a total of  35  minutes on total care today- >50% coordination of care- due to d/w pt about insulin- wasn't on Levemir or multiple times/day at home- and d/w about bowels- also d/w nursing  LOS: 10 days A FACE TO FACE EVALUATION WAS PERFORMED  Takiera Mayo 09/11/2021, 8:34 AM

## 2021-09-12 DIAGNOSIS — K5900 Constipation, unspecified: Secondary | ICD-10-CM

## 2021-09-12 DIAGNOSIS — M79606 Pain in leg, unspecified: Secondary | ICD-10-CM

## 2021-09-12 DIAGNOSIS — E1169 Type 2 diabetes mellitus with other specified complication: Secondary | ICD-10-CM

## 2021-09-12 LAB — GLUCOSE, CAPILLARY
Glucose-Capillary: 71 mg/dL (ref 70–99)
Glucose-Capillary: 102 mg/dL — ABNORMAL HIGH (ref 70–99)
Glucose-Capillary: 82 mg/dL (ref 70–99)
Glucose-Capillary: 126 mg/dL — ABNORMAL HIGH (ref 70–99)

## 2021-09-12 NOTE — Progress Notes (Signed)
Physical Therapy Session Note  Patient Details  Name: Randall Coleman MRN: [id number] Date of Birth: Feb 14, 1951  Today's Date: 09/12/2021 PT Individual Time: 0905-1000 PT Individual Time Calculation (min): 55 min   Short Term Goals: Week 1:  PT Short Term Goal 1 (Week 1): Pt will perform least restrictive transfer with min A consistently PT Short Term Goal 1 - Progress (Week 1): Met PT Short Term Goal 2 (Week 1): Pt will maintain standing balance x 5 min while completing functional task with min A PT Short Term Goal 2 - Progress (Week 1): Met PT Short Term Goal 3 (Week 1): Pt will initiate gait training PT Short Term Goal 3 - Progress (Week 1): Met Week 2:  PT Short Term Goal 1 (Week 2): =LTG due to ELOS   Skilled Therapeutic Interventions/Progress Updates:   Pt received supine in bed and agreeable to PT. Supine>sit transfer with out assist or cues from PT. Stand pivot transfer to [facility] with min assist due to posterior bias with initial step to WC.   WC Mobility through hall of rehab unit 173f, 1862f atrium x [id number] over cement sidewalk at entrnace of [facility] 2 x [id number] handicap ramp x [id number] with supervision assist on level sidwalk and min assist for ascent and descent of sidewalk.   Dynamic balance and standing tolerance whlie engaged in Wii bowling. Pt able to perform sit<>stand with CGA throughout with one instance of limb guard getting caught on seat. Performed gross motor movement for wii controler standing for 2 +2 +1 frames with seated rest breaks for additional frames with RLE feeling "shakey"  [facility] returned to room and [facility] and all needs met.         Therapy Documentation Precautions:  Precautions Precautions: Fall Precaution Comments: wound vac Required Braces or Orthoses: Other Brace Other Brace: L limb protector Restrictions Weight Bearing Restrictions: Yes LLE Weight Bearing: Non weight bearing    Vital  Signs: Therapy Vitals Pulse Rate: (!) 51 BP: 97/60 Pain: Pain Assessment Pain Score: 6  LLE. Limb gaurrd repositioned.  Therapy/Group: Individual Therapy  AustiLorie Phenix/2023, 10:35 AM

## 2021-09-12 NOTE — Plan of Care (Signed)
  Problem: Consults Goal: RH LIMB LOSS PATIENT EDUCATION Description: Description: See Patient Education module for eduction specifics. Outcome: Progressing Goal: Skin Care Protocol Initiated - if Braden Score 18 or less Description: If consults are not indicated, leave blank or document N/A Outcome: Progressing   Problem: RH BOWEL ELIMINATION Goal: RH STG MANAGE BOWEL WITH ASSISTANCE Description: STG Manage Bowel with Mod I Assistance. Outcome: Progressing Goal: RH STG MANAGE BOWEL W/MEDICATION W/ASSISTANCE Description: STG Manage Bowel with Medication with Mod I Assistance. Outcome: Progressing   Problem: RH SKIN INTEGRITY Goal: RH STG MAINTAIN SKIN INTEGRITY WITH ASSISTANCE Description: STG Maintain Skin Integrity With Mod I Assistance. Outcome: Progressing Goal: RH STG ABLE TO PERFORM INCISION/WOUND CARE W/ASSISTANCE Description: STG Able To Perform Incision/Wound Care With Mod I Assistance. Outcome: Progressing   Problem: RH SAFETY Goal: RH STG ADHERE TO SAFETY PRECAUTIONS W/ASSISTANCE/DEVICE Description: STG Adhere to Safety Precautions With Cues and Reminders. Outcome: Progressing Goal: RH STG DECREASED RISK OF FALL WITH ASSISTANCE Description: STG Decreased Risk of Fall With Mod I Assistance. Outcome: Progressing   Problem: RH PAIN MANAGEMENT Goal: RH STG PAIN MANAGED AT OR BELOW PT'S PAIN GOAL Description: < 3 on a 0-10 pain scale. Outcome: Progressing   Problem: RH KNOWLEDGE DEFICIT LIMB LOSS Goal: RH STG INCREASE KNOWLEDGE OF SELF CARE AFTER LIMB LOSS Description: Patient will demonstrate knowledge of self-care management, medication/pain management, skin/wound care, weight bearing precautions with educational materials and handouts provided by staff independently at discharge. Outcome: Progressing

## 2021-09-12 NOTE — Progress Notes (Signed)
PROGRESS NOTE   Subjective/Complaints:  Large BM recorded today. No new concerns.   ROS:  Pt denies SOB, HA, abd pain, CP, N/V/ (+)C/D, and vision changes  Objective:   No results found. No results for input(s): "WBC", "HGB", "HCT", "PLT" in the last 72 hours.   No results for input(s): "NA", "K", "CL", "CO2", "GLUCOSE", "BUN", "CREATININE", "CALCIUM" in the last 72 hours.    Intake/Output Summary (Last 24 hours) at 09/12/2021 1916 Last data filed at 09/12/2021 1900 Gross per 24 hour  Intake 840 ml  Output 1950 ml  Net -1110 ml         Physical Exam: Vital Signs Blood pressure (!) 90/42, pulse 60, temperature 98.4 F (36.9 C), temperature source Oral, resp. rate 16, height '6\' 2"'$  (1.88 m), weight 84.6 kg, SpO2 96 %.         General: awake, alert, appropriate, sitting u in bed; NAD HENT: conjugate gaze; oropharynx moist CV: RRR; no JVD Pulmonary: CTA B/L; no W/R/R- good air movement GI: soft, NT, ND, (+)BS- normal Psychiatric: appropriate- more interactive Neurological: poor memory Musculoskeletal:     Comments: Left BK stump bulbous.tender -wearing shrinker and limb guard- VAC off- dressing C/D/I- localized erythema Skin:    General: Skin is warm and dry.     Comments: Wound vac removed 7/8, staples present at incision site, CDI.  Dry dressing with Kerlix present. Onychomycosis of right toenails.   Neurological:     Mental Status: He is alert and oriented to person, place, and time.     Cranial Nerves: No cranial nerve deficit.     Comments: Oriented to self and situation--place-still need cues for month. CN exam intact. UE motor 5/5. RLE 4/5. LLE 3/5 HF, KE/KF. Decreased LT distal RLE below ankles   Assessment/Plan: 1. Functional deficits which require 3+ hours per day of interdisciplinary therapy in a comprehensive inpatient rehab setting. Physiatrist is providing close team supervision and 24 hour  management of active medical problems listed below. Physiatrist and rehab team continue to assess barriers to discharge/monitor patient progress toward functional and medical goals  Care Tool:  Bathing    Body parts bathed by patient: Front perineal area, Buttocks   Body parts bathed by helper: Right lower leg Body parts n/a: Left lower leg   Bathing assist Assist Level: Supervision/Verbal cueing     Upper Body Dressing/Undressing Upper body dressing   What is the patient wearing?: Pull over shirt    Upper body assist Assist Level: Independent    Lower Body Dressing/Undressing Lower body dressing      What is the patient wearing?: Pants     Lower body assist Assist for lower body dressing: Supervision/Verbal cueing     Toileting Toileting    Toileting assist Assist for toileting: Contact Guard/Touching assist     Transfers Chair/bed transfer  Transfers assist     Chair/bed transfer assist level: Contact Guard/Touching assist     Locomotion Ambulation   Ambulation assist   Ambulation activity did not occur: Safety/medical concerns  Assist level: Contact Guard/Touching assist Assistive device: Walker-rolling Max distance: 20'   Walk 10 feet activity   Assist  Walk 10  feet activity did not occur: Safety/medical concerns  Assist level: Contact Guard/Touching assist Assistive device: Walker-rolling   Walk 50 feet activity   Assist Walk 50 feet with 2 turns activity did not occur: Safety/medical concerns         Walk 150 feet activity   Assist Walk 150 feet activity did not occur: Safety/medical concerns         Walk 10 feet on uneven surface  activity   Assist Walk 10 feet on uneven surfaces activity did not occur: Safety/medical concerns         Wheelchair     Assist Is the patient using a wheelchair?: Yes Type of Wheelchair: Manual Wheelchair activity did not occur: Refused  Wheelchair assist level:  Supervision/Verbal cueing Max wheelchair distance: 200'    Wheelchair 50 feet with 2 turns activity    Assist    Wheelchair 50 feet with 2 turns activity did not occur: Refused   Assist Level: Supervision/Verbal cueing   Wheelchair 150 feet activity     Assist  Wheelchair 150 feet activity did not occur: Refused   Assist Level: Supervision/Verbal cueing   Blood pressure (!) 90/42, pulse 60, temperature 98.4 F (36.9 C), temperature source Oral, resp. rate 16, height '6\' 2"'$  (1.88 m), weight 84.6 kg, SpO2 96 %.  Medical Problem List and Plan: 1. 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   D/c 7/18  Con't CIR- PT and OT- per SW, going to [facility] to stay with other family member- they are moving him there and will arrange for new PCP.   Con't CIR= PT, and OT- no SLP since at baseline 2.  Antithrombotics: -DVT/anticoagulation:  Pharmaceutical: Other (comment)--Eliquis             -antiplatelet therapy:  N/A 3. Pain Management: Oxycodone prn             -add gabapentin '100mg'$  TID for phantom limb pain  7/7- will d/c Gabapentin due ot memory issues and add Lyrica 50 mg TID; also started Cymbalta yesterday- no nausea so far-  7/9 No side effects from Cymbalta, continue Lyrica 50 mg TID  7/10- no side effects- will increase Cymbalta as of the AM to 60 mg daily.   7/11- no side effects with increase in cymbalta.   7/12- also getting mirror therapy -feels that's somewhat helpful-   7/13- pain doing better per pt- con't regimen             -discussed massage and desensitization  7/15 reports pain under control, continue current for now 4. Mood/Sleep: LCSW to follow for evaluation and support.              -antipsychotic agents: N/A 5. Neuropsych/cognition: This  patient is capable of making decisions on his own behalf. 6. Skin/Wound Care: Continue wound VAC for 7 days post-op  7/6- VAC to be d/c'd 09/05/21  7/7- wrote order to d/c [facility] tomorrow if not already off.  7/8 Wound vac removed this afternoon by nurse.  7/9 Wound vac removed 7/8, staples present at incision site, CDI.  Dry dressing with Kerlix present.  7/11- con't shrinker  7/12- educated on to prevent hip/knee contractures- will con't to do so, since poor memory. Wearing limb guard  7/15 Continue discuss contraction prevention 7. Fluids/Electrolytes/Nutrition: Has been on IVF since admission-->d/c.  --Check CMET in am             --encourage intake. Will change glucerna to TID with meals to avoid BS spikes             --continue Juven, Vitamin C and Zinc to promote wound healing.  8. A fib: Monitor HR TID--currently controlled on Cardizem 120 mg BID and metoprolol 50 mg bid             --on Eliquis w/ H/H stable 9. T2DM: Hgb A1c-7.9-- 10.7 in 4 months likely due to infection/now controlled.   -- Mild DKA-->beta hydroxybutyric acid 3.55-->0.91 --will continue to monitor BS ac/hs and titrate meds as indicated.              --Was on Metformin 1000 mg bid at home             --continue Levemir (increased to 15 u BID 07/04) w/ 4 units novolog  ac TID. Titrate as indicated.  7/5- Cbg's 200-300s- just got increase yesterday- will add metformin 500 mg BID back to regimen.   7/6- CBG's look better this AM- 178- with addition of Metformin- will monitor and make changes tomorrow as required  7/7- CBG's- mid to upper 100s- will increase metformin to 850 mg BID and monitor CBG's.  7/8 Continue to monitor as change to metformin dose still new. 7/9 CBG's 132-161 today.  7/13- CBGs controlled- need to make sure pt knows how to give self insulin at d/c- teach insulin- will place order for this  7/14- CBGs look great- will ncrease metformin to 1000 mg BID and stop Novolog with meals 4 units with meals since  wasn't on insulin at home prior- of note came in with mild DKA  7/15 CBGs very well controlled, continue to monitor CBG (last 3)  Recent Labs    09/12/21 0603 09/12/21 1144 09/12/21 1639  GLUCAP 71 102* 82     10. Depressed systolic LVF: Started on Sacubitril-valsartan bid on 07/02. -Monitor renal function              -daily weights 11. ABLA/anemia of chronic illness: Monitor H/H with serial checks. 7/6- Hb 11.6- will monitor to monitor- doesn't want iron at this time.  7/10- Hb 10.90 overall stable, but will monitor trend.  12. Hypomagnesemia: Improved post IV supplement and on oral supplement -likely due to malnutrition. Will recheck in am.   7/5- Mg level is 1.9- will con't to monitor 13. Chronic hyponatremia: Improving. Will recheck Na level in am.    7/5- Na is 134- will monitor for trends.  7/10- Na 137 14. Constipation 7/7- gave sorbitol without a BM- will order soap suds enema today if no BM by 3pm- pt still feels constipated  7/12- LBM yesterday- going well- con't regimen 7/14- LBM 2 days ago- if no BM by 3pm Saturday, give sorbitol 7/15 BM today large, follow    LOS: 11 days A FACE TO FACE EVALUATION WAS PERFORMED  Randall Coleman 09/12/2021, 7:16 PM

## 2021-09-13 DIAGNOSIS — E871 Hypo-osmolality and hyponatremia: Secondary | ICD-10-CM

## 2021-09-13 LAB — GLUCOSE, CAPILLARY
Glucose-Capillary: 133 mg/dL — ABNORMAL HIGH (ref 70–99)
Glucose-Capillary: 137 mg/dL — ABNORMAL HIGH (ref 70–99)
Glucose-Capillary: 100 mg/dL — ABNORMAL HIGH (ref 70–99)
Glucose-Capillary: 135 mg/dL — ABNORMAL HIGH (ref 70–99)

## 2021-09-13 NOTE — Progress Notes (Signed)
PROGRESS NOTE   Subjective/Complaints: No new concerns.  LBM today  ROS:  Pt denies SOB, HA, abd pain, CP, palpitations, N/V/ (+)C/D, and vision changes  Objective:   No results found. No results for input(s): "WBC", "HGB", "HCT", "PLT" in the last 72 hours.   No results for input(s): "NA", "K", "CL", "CO2", "GLUCOSE", "BUN", "CREATININE", "CALCIUM" in the last 72 hours.    Intake/Output Summary (Last 24 hours) at 09/13/2021 2053 Last data filed at 09/13/2021 1815 Gross per 24 hour  Intake 480 ml  Output 1450 ml  Net -970 ml         Physical Exam: Vital Signs Blood pressure 135/60, pulse (!) 59, temperature 98.1 F (36.7 C), temperature source Oral, resp. rate 18, height '6\' 2"'$  (1.88 m), weight 84.6 kg, SpO2 98 %.  General: awake, alert, appropriate, sitting in bed; NAD HENT: conjugate gaze; oropharynx moist CV: RRR; no JVD Pulmonary: CTA B/L; no W/R/R GI: soft, NT, ND, (+)BS- normal Psychiatric: appropriate- more interactive Neurological: poor memory Musculoskeletal:     Comments: Left BK stump bulbous.tender -wearing shrinker and limb guard- VAC off- dressing C/D/I Skin:    General: Skin is warm and dry.     Comments: Wound vac removed 7/8, staples present at incision site, CDI.  Dry dressing with Kerlix present. Onychomycosis of right toenails.   Neurological:     Mental Status: He is alert and oriented to person, place, and time.  Follows commands    Cranial Nerves: No cranial nerve deficit.     Comments: Oriented to self and situation--place-still need cues for month. CN exam intact. UE motor 5/5. RLE 4/5. LLE 3/5 HF, KE/KF. Decreased LT distal RLE below ankles   Assessment/Plan: 1. Functional deficits which require 3+ hours per day of interdisciplinary therapy in a comprehensive inpatient rehab setting. Physiatrist is providing close team supervision and 24 hour management of active medical problems  listed below. Physiatrist and rehab team continue to assess barriers to discharge/monitor patient progress toward functional and medical goals  Care Tool:  Bathing    Body parts bathed by patient: Front perineal area, Buttocks   Body parts bathed by helper: Right lower leg Body parts n/a: Left lower leg   Bathing assist Assist Level: Supervision/Verbal cueing     Upper Body Dressing/Undressing Upper body dressing   What is the patient wearing?: Pull over shirt    Upper body assist Assist Level: Independent    Lower Body Dressing/Undressing Lower body dressing      What is the patient wearing?: Pants     Lower body assist Assist for lower body dressing: Supervision/Verbal cueing     Toileting Toileting    Toileting assist Assist for toileting: Contact Guard/Touching assist     Transfers Chair/bed transfer  Transfers assist     Chair/bed transfer assist level: Supervision/Verbal cueing (via squat pivot)     Locomotion Ambulation   Ambulation assist   Ambulation activity did not occur: Safety/medical concerns  Assist level: Contact Guard/Touching assist Assistive device: Walker-rolling Max distance: 20'   Walk 10 feet activity   Assist  Walk 10 feet activity did not occur: Safety/medical concerns  Assist level: Contact  Guard/Touching assist Assistive device: Walker-rolling   Walk 50 feet activity   Assist Walk 50 feet with 2 turns activity did not occur: Safety/medical concerns         Walk 150 feet activity   Assist Walk 150 feet activity did not occur: Safety/medical concerns         Walk 10 feet on uneven surface  activity   Assist Walk 10 feet on uneven surfaces activity did not occur: Safety/medical concerns         Wheelchair     Assist Is the patient using a wheelchair?: Yes Type of Wheelchair: Manual Wheelchair activity did not occur: Refused  Wheelchair assist level: Set up assist Max wheelchair distance:  187f    Wheelchair 50 feet with 2 turns activity    Assist    Wheelchair 50 feet with 2 turns activity did not occur: Refused   Assist Level: Supervision/Verbal cueing   Wheelchair 150 feet activity     Assist  Wheelchair 150 feet activity did not occur: Refused   Assist Level: Supervision/Verbal cueing   Blood pressure 135/60, pulse (!) 59, temperature 98.1 F (36.7 C), temperature source Oral, resp. rate 18, height '6\' 2"'$  (1.88 m), weight 84.6 kg, SpO2 98 %.  Medical Problem List and Plan: 1. 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   D/c 7/18  Con[facility] CIR- PT and OT- [facility] they are [facility] him there and will arrange for new PCP.   Con't CIR PT, and OT- no SLP since at baseline 2.  Antithrombotics: -DVT/anticoagulation:  Pharmaceutical: Other (comment)--Eliquis             -antiplatelet therapy:  N/A 3. Pain Management: Oxycodone prn             -add gabapentin '100mg'$  TID for phantom limb pain  7/7- will d/c Gabapentin due ot memory issues and add Lyrica 50 mg TID; also started Cymbalta yesterday- no nausea so far-  7/9 No side effects from Cymbalta, continue Lyrica 50 mg TID  7/10- no side effects- will increase Cymbalta as of the AM to 60 mg daily.   7/11- no side effects with increase in cymbalta.   7/12- also getting mirror therapy -feels that's somewhat helpful-   7/13- pain doing better per pt- con't regimen             -discussed massage and desensitization  7/15 reports pain under control, continue current for now 4. Mood/Sleep: LCSW to follow for evaluation and support.              -antipsychotic agents: N/A 5. Neuropsych/cognition: This patient is capable of making decisions on his own  behalf. 6. Skin/Wound Care: Continue wound VAC for 7 days post-op  7/6- VAC to be d/c'd 09/05/21  [facility]  7/8 Wound vac removed this afternoon by nurse.  7/9 Wound vac removed 7/8, staples present at incision site, CDI.  Dry dressing with Kerlix present.  7/11- con't shrinker  7/12- educated on to prevent hip/knee contractures- will con't to  do so, since poor memory. Wearing limb guard  7/15 Continue discuss contraction prevention 7. Fluids/Electrolytes/Nutrition: Has been on IVF since admission-->d/c.  --Check CMET in am             --encourage intake. Will change glucerna to TID with meals to avoid BS spikes             --continue Juven, Vitamin C and Zinc to promote wound healing.  8. A fib: Monitor HR TID--currently controlled on Cardizem 120 mg BID and metoprolol 50 mg bid             --on Eliquis w/ H/H stable 9. T2DM: Hgb A1c-7.9-- 10.7 in 4 months likely due to infection/now controlled.   -- Mild DKA-->beta hydroxybutyric acid 3.55-->0.91 --will continue to monitor BS ac/hs and titrate meds as indicated.              --Was on Metformin 1000 mg bid at home             --continue Levemir (increased to 15 u BID 07/04) w/ 4 units novolog  ac TID. Titrate as indicated.  7/5- Cbg's 200-300s- just got increase yesterday- will add metformin 500 mg BID back to regimen.   7/6- CBG's look better this AM- 178- with addition of Metformin- will monitor and make changes tomorrow as required  7/7- CBG's- mid to upper 100s- will increase metformin to 850 mg BID and monitor CBG's.  7/8 Continue to monitor as change to metformin dose still new. 7/9 CBG's 132-161 today.  7/13- CBGs controlled- need to make sure pt knows how to give self insulin at d/c- teach insulin- will place order for this  7/14- CBGs look great- will ncrease metformin to 1000 mg BID and stop Novolog with meals 4 units with meals since wasn't on insulin at home prior- of note came in  with mild DKA  7/16 CBGs well controlled CBG (last 3)  Recent Labs    09/13/21 0602 09/13/21 1142 09/13/21 1704  GLUCAP 133* 137* 100*     10. Depressed systolic LVF: Started on Sacubitril-valsartan bid on 07/02. -Monitor renal function              -daily weights 11. ABLA/anemia of chronic illness: Monitor H/H with serial checks. 7/6- Hb 11.6- will monitor to monitor- doesn't want iron at this time.  7/10- Hb 10.90 overall stable, but will monitor trend.  -Weekly labs tomorrow 12. Hypomagnesemia: Improved post IV supplement and on oral supplement -likely due to malnutrition. Will recheck in am.   7/5- Mg level is 1.9- will con't to monitor 13. Chronic hyponatremia: Improving. Will recheck Na level in am.    7/5- Na is 134- will monitor for trends.  7/10- Na 137  -weekly labs tomorrow 14. Constipation 7/7- gave sorbitol without a BM- will order soap suds enema today if no BM by 3pm- pt still feels constipated  7/12- LBM yesterday- going well- con't regimen 7/14- LBM 2 days ago- if no BM by 3pm Saturday, give sorbitol 7/15 LBM today, improved    LOS: 12 days A FACE TO FACE EVALUATION WAS PERFORMED  Jennye Boroughs 09/13/2021, 8:53 PM

## 2021-09-13 NOTE — Progress Notes (Signed)
Physical Therapy Discharge Summary  Patient Details  Name: Randall Coleman MRN: [id number] Date of Birth: 1950/10/01  {CHL IP REHAB PT TIME CALCULATION:304800500}   Patient has met {NUMBERS 0-12:18577} of 9 long term goals due to improved activity tolerance, improved balance, increased strength, decreased pain, and improved awareness.  Patient to discharge at a wheelchair level Supervision.   Pt will be moving in with  sister in [place] has been unable to participate in family education. However pt will be moving to live temporarily with sister.  to provide the necessary physical assistance at discharge.  Reasons goals not met: ***  Recommendation:  Patient will benefit from ongoing skilled PT services in {setting:3041680} to continue to advance safe functional mobility, address ongoing impairments in ***, and minimize fall risk.  Equipment: {equipment:3041657}  Reasons for discharge: treatment goals met and discharge from hospital  Patient/family agrees with progress made and goals achieved: Yes  PT Discharge Precautions/Restrictions   Vital Signs  Pain Pain Assessment Pain Scale: 0-10 Pain Score: 7  Pain Type: Acute pain;Phantom pain Pain Location: Leg Pain Interference   Vision/Perception     Cognition   Sensation   Motor     Mobility Bed Mobility Bed Mobility: Rolling Right;Rolling Left;Supine to Sit;Sit to Supine Rolling Right: Independent with assistive device Rolling Left: Independent with assistive device Supine to Sit: Independent with assistive device Sit to Supine: Independent with assistive device Transfers Transfers: Sit to Stand;Stand Pivot Transfers Sit to Stand: Contact Guard/Touching assist Stand to Sit: Contact Guard/Touching assist Stand Pivot Transfers: Contact Guard/Touching assist Stand Pivot Transfer Details: Verbal cues for technique;Verbal cues for precautions/safety;Verbal cues for sequencing Transfer (Assistive device): Rolling  walker Locomotion     Trunk/Postural Assessment  Cervical Assessment Cervical Assessment: Exceptions to [facility] (forward head) Thoracic Assessment Thoracic Assessment: Exceptions to [facility] (rounded shoulders) Lumbar Assessment Lumbar Assessment: Exceptions to [facility] (posterior pelvic tilt) Postural Control Postural Control: Deficits on evaluation  Balance Balance Balance Assessed: Yes Static Sitting Balance Static Sitting - Balance Support: No upper extremity supported;Feet supported Static Sitting - Level of Assistance: 7: Independent Dynamic Sitting Balance Dynamic Sitting - Balance Support: No upper extremity supported;Feet supported;During functional activity Dynamic Sitting - Level of Assistance: 6: Modified independent (Device/Increase time) Dynamic Sitting - Balance Activities: Forward lean/weight shifting;Reaching for objects Static Standing Balance Static Standing - Balance Support: Bilateral upper extremity supported;During functional activity Static Standing - Level of Assistance: 5: Stand by assistance Dynamic Standing Balance Dynamic Standing - Balance Support: Bilateral upper extremity supported;During functional activity Dynamic Standing - Level of Assistance: 4: Min assist Extremity Assessment      RLE Assessment RLE Assessment: Exceptions to [facility] General Strength Comments: impaired, see below RLE Strength Right Hip Flexion: 4/5 Right Knee Flexion: 4/5 Right Knee Extension: 4/5 Right Ankle Dorsiflexion: 4/5 LLE Assessment LLE Assessment: Exceptions to [facility] Passive Range of Motion (PROM) Comments: L BKA General Strength Comments: not tested 2/2 L BKA    [name] 09/13/2021, 1:46 PM

## 2021-09-13 NOTE — Progress Notes (Signed)
Physical Therapy Session Note  Patient Details  Name: Randall Coleman MRN: [id number] Date of Birth: Aug 15, 1950  Today's Date: 09/13/2021 PT Individual Time: 5361-4431 PT Individual Time Calculation (min): 50 min   Short Term Goals: Week 2:  PT Short Term Goal 1 (Week 2): =LTG due to ELOS  Skilled Therapeutic Interventions/Progress Updates: Pt presented in bed agreeable to therapy. Pt states pain 7/10 during session with limb guard donned. Session focused on functional mobility techniques in preparation for d/c. Pt performed bed mobility mod I and performed squat pivot transfer to w/c with supervision. Pt propelled to ortho gym with supervision and able to manage leg rests with increased time and verbal cues. Performed stand pivot with RW and CGA to perform car transfer and with verbal cues was able to transfer into vehicle with supervision. Pt required verbal cues for sequencing in/out of vehicle. Pt then propelled supervision to rehab gym and performed squat pivot to high/low mat in same manner as prior. Pt expressing frustration that he feels like phantom pain and pain around knee has been increasing. Discussed continued education of desensitizing and instructed pt in removing shrinker when performing desensitization. Upon removal of shrinker PTA noted that ace bandage on residual limb but wrapped below knee creating a tourniquet. PTA removed ace bandage and pt was able to don shrinker supervision as well as indicating some relief. PTA supported limb guard while pt secured it. Pt was able to return to w/c via squat pivot and propelled back to room. In room pt transferred to bed via squat pivot and supervision. Pt returned to to supine mod I and left with bed alarm on, call bell within reach and needs met.      Therapy Documentation Precautions:  Precautions Precautions: Fall Precaution Comments: wound vac Required Braces or Orthoses: Other Brace Other Brace: L limb  protector Restrictions Weight Bearing Restrictions: Yes LLE Weight Bearing: Non weight bearing General:   Vital Signs: Therapy Vitals Temp: 98.2 F (36.8 C) Pulse Rate: 60 Resp: 16 BP: (!) 103/58 Patient Position (if appropriate): Lying Oxygen Therapy SpO2: 99 % O2 Device: Room Air Pain: Pain Assessment Pain Scale: 0-10 Pain Score: 8  Pain Type: Acute pain;Phantom pain Pain Location: Leg Pain Orientation: Left Pain Intervention(s): Medication (See eMAR) Mobility: Bed Mobility Bed Mobility: Rolling Right;Rolling Left;Supine to Sit;Sit to Supine Rolling Right: Independent with assistive device Rolling Left: Independent with assistive device Supine to Sit: Independent with assistive device Sit to Supine: Independent with assistive device Transfers Transfers: Sit to Stand;Stand Pivot Transfers Sit to Stand: Contact Guard/Touching assist Stand to Sit: Contact Guard/Touching assist Stand Pivot Transfers: Contact Guard/Touching assist Stand Pivot Transfer Details: Verbal cues for technique;Verbal cues for precautions/safety;Verbal cues for sequencing Transfer (Assistive device): Rolling walker Locomotion :    Trunk/Postural Assessment : Cervical Assessment Cervical Assessment: Exceptions to [facility] (forward head) Thoracic Assessment Thoracic Assessment: Exceptions to [facility] (rounded shoulders) Lumbar Assessment Lumbar Assessment: Exceptions to [facility] (posterior pelvic tilt) Postural Control Postural Control: Deficits on evaluation  Balance: Balance Balance Assessed: Yes Static Sitting Balance Static Sitting - Balance Support: No upper extremity supported;Feet supported Static Sitting - Level of Assistance: 7: Independent Dynamic Sitting Balance Dynamic Sitting - Balance Support: No upper extremity supported;Feet supported;During functional activity Dynamic Sitting - Level of Assistance: 6: Modified independent (Device/Increase time) Dynamic Sitting - Balance Activities:  Forward lean/weight shifting;Reaching for objects Static Standing Balance Static Standing - Balance Support: Bilateral upper extremity supported;During functional activity Static Standing - Level of Assistance: 5: Stand by  assistance Dynamic Standing Balance Dynamic Standing - Balance Support: Bilateral upper extremity supported;During functional activity Dynamic Standing - Level of Assistance: 4: Min assist Exercises:   Other Treatments:      Therapy/Group: Individual Therapy  Nadira Single 09/13/2021, 3:52 PM

## 2021-09-14 ENCOUNTER — Telehealth: Payer: Self-pay

## 2021-09-14 DIAGNOSIS — Z89512 Acquired absence of left leg below knee: Secondary | ICD-10-CM

## 2021-09-14 DIAGNOSIS — R4189 Other symptoms and signs involving cognitive functions and awareness: Secondary | ICD-10-CM

## 2021-09-14 DIAGNOSIS — Z86718 Personal history of other venous thrombosis and embolism: Secondary | ICD-10-CM

## 2021-09-14 DIAGNOSIS — I480 Paroxysmal atrial fibrillation: Secondary | ICD-10-CM

## 2021-09-14 LAB — CBC
HCT: 32.7 % — ABNORMAL LOW (ref 39.0–52.0)
Hemoglobin: 10.9 g/dL — ABNORMAL LOW (ref 13.0–17.0)
MCH: 28.4 pg (ref 26.0–34.0)
MCHC: 33.3 g/dL (ref 30.0–36.0)
MCV: 85.2 fL (ref 80.0–100.0)
Platelets: 204 10*3/uL (ref 150–400)
RBC: 3.84 MIL/uL — ABNORMAL LOW (ref 4.22–5.81)
RDW: 14.4 % (ref 11.5–15.5)
WBC: 5.4 10*3/uL (ref 4.0–10.5)
nRBC: 0 % (ref 0.0–0.2)

## 2021-09-14 LAB — GLUCOSE, CAPILLARY
Glucose-Capillary: 67 mg/dL — ABNORMAL LOW (ref 70–99)
Glucose-Capillary: 74 mg/dL (ref 70–99)
Glucose-Capillary: 103 mg/dL — ABNORMAL HIGH (ref 70–99)
Glucose-Capillary: 67 mg/dL — ABNORMAL LOW (ref 70–99)
Glucose-Capillary: 93 mg/dL (ref 70–99)
Glucose-Capillary: 100 mg/dL — ABNORMAL HIGH (ref 70–99)
Glucose-Capillary: 106 mg/dL — ABNORMAL HIGH (ref 70–99)
Glucose-Capillary: 115 mg/dL — ABNORMAL HIGH (ref 70–99)
Glucose-Capillary: 77 mg/dL (ref 70–99)

## 2021-09-14 LAB — BASIC METABOLIC PANEL
Anion gap: 8 (ref 5–15)
BUN: 21 mg/dL (ref 8–23)
CO2: 26 mmol/L (ref 22–32)
Calcium: 9 mg/dL (ref 8.9–10.3)
Chloride: 104 mmol/L (ref 98–111)
Creatinine, Ser: 0.63 mg/dL (ref 0.61–1.24)
GFR, Estimated: >60 mL/min (ref >60)
Glucose, Bld: 69 mg/dL — ABNORMAL LOW (ref 70–99)
Potassium: 4.1 mmol/L (ref 3.5–5.1)
Sodium: 138 mmol/L (ref 135–145)

## 2021-09-14 NOTE — Progress Notes (Signed)
PROGRESS NOTE   Subjective/Complaints: No new concerns this morning Discussed with patient and nursing that staples may be removed today Ordered 2 of smaller shrinker size  ROS:  Pt denies SOB, HA, abd pain, CP, palpitations, N/V/ (+)C/D, and vision changes  Objective:   No results found. Recent Labs    09/14/21 0614  WBC 5.4  HGB 10.9*  HCT 32.7*  PLT 204     Recent Labs    09/14/21 0614  NA 138  K 4.1  CL 104  CO2 26  GLUCOSE 69*  BUN 21  CREATININE 0.63  CALCIUM 9.0      Intake/Output Summary (Last 24 hours) at 09/14/2021 1337 Last data filed at 09/14/2021 1331 Gross per 24 hour  Intake 1080 ml  Output 750 ml  Net 330 ml        Physical Exam: Vital Signs Blood pressure (!) 107/58, pulse 62, temperature 98.1 F (36.7 C), temperature source Oral, resp. rate 18, height '6\' 2"'$  (1.88 m), weight 84.6 kg, SpO2 100 %.  Gen: no distress, normal appearing HEENT: oral mucosa pink and moist, NCAT Cardio: Reg rate Chest: normal effort, normal rate of breathing Abd: soft, non-distended Ext: no edema Psych: pleasant, normal affect Musculoskeletal:     Comments: Left BK stump bulbous.tender -wearing shrinker and limb guard- VAC off- dressing C/D/I Skin:    General: Skin is warm and dry.     Comments: Wound vac removed 7/8, staples present at incision site, CDI.  Dry dressing with Kerlix present. Onychomycosis of right toenails.   Neurological:     Mental Status: He is alert and oriented to person, place, and time.  Follows commands    Cranial Nerves: No cranial nerve deficit.     Comments: Oriented to self and situation--place-still need cues for month. CN exam intact. UE motor 5/5. RLE 4/5. LLE 3/5 HF, KE/KF. Decreased LT distal RLE below ankles   Assessment/Plan: 1. Functional deficits which require 3+ hours per day of interdisciplinary therapy in a comprehensive inpatient rehab setting. Physiatrist  is providing close team supervision and 24 hour management of active medical problems listed below. Physiatrist and rehab team continue to assess barriers to discharge/monitor patient progress toward functional and medical goals  Care Tool:  Bathing    Body parts bathed by patient: Right arm, Left arm, Chest, Abdomen, Front perineal area, Buttocks, Right upper leg, Left upper leg, Right lower leg   Body parts bathed by helper: Right lower leg Body parts n/a: Left lower leg   Bathing assist Assist Level: Supervision/Verbal cueing     Upper Body Dressing/Undressing Upper body dressing   What is the patient wearing?: Pull over shirt    Upper body assist Assist Level: Independent    Lower Body Dressing/Undressing Lower body dressing      What is the patient wearing?: Pants     Lower body assist Assist for lower body dressing: Supervision/Verbal cueing     Toileting Toileting    Toileting assist Assist for toileting: Supervision/Verbal cueing     Transfers Chair/bed transfer  Transfers assist     Chair/bed transfer assist level: Supervision/Verbal cueing     Locomotion Ambulation  Ambulation assist   Ambulation activity did not occur: Safety/medical concerns  Assist level: Contact Guard/Touching assist Assistive device: Walker-rolling Max distance: 69f   Walk 10 feet activity   Assist  Walk 10 feet activity did not occur: Safety/medical concerns  Assist level: Contact Guard/Touching assist Assistive device: Walker-rolling   Walk 50 feet activity   Assist Walk 50 feet with 2 turns activity did not occur: Safety/medical concerns         Walk 150 feet activity   Assist Walk 150 feet activity did not occur: Safety/medical concerns         Walk 10 feet on uneven surface  activity   Assist Walk 10 feet on uneven surfaces activity did not occur: Safety/medical concerns         Wheelchair     Assist Is the patient using a  wheelchair?: Yes Type of Wheelchair: Manual Wheelchair activity did not occur: Refused  Wheelchair assist level: Independent, Supervision/Verbal cueing Max wheelchair distance: 151f   Wheelchair 50 feet with 2 turns activity    Assist    Wheelchair 50 feet with 2 turns activity did not occur: Refused   Assist Level: Independent   Wheelchair 150 feet activity     Assist  Wheelchair 150 feet activity did not occur: Refused   Assist Level: Independent   Blood pressure (!) 107/58, pulse 62, temperature 98.1 F (36.7 C), temperature source Oral, resp. rate 18, height '6\' 2"'$  (1.88 m), weight 84.6 kg, SpO2 100 %.  Medical Problem List and Plan: 1. 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   D/c 7/18  Con't CIR- PT and OT- [facility]- they are moving him there and will arrange for new PCP.   Con't CIR PT, and OT- no SLP since at baseline 2.  Antithrombotics: -DVT/anticoagulation:  Pharmaceutical: Other (comment)--Eliquis             -antiplatelet therapy:  N/A 3. Pain Management: Oxycodone prn             -add gabapentin '100mg'$  TID for phantom limb pain  7/7- will d/c Gabapentin due ot memory issues and add Lyrica 50 mg TID; also started Cymbalta yesterday- no nausea so far-  7/9 No side effects from Cymbalta, continue Lyrica 50 mg TID  7/10- no side effects- will increase Cymbalta as of the AM to 60 mg daily.   7/11- no side effects with increase in cymbalta.   7/12- also getting mirror therapy -feels that's somewhat helpful-   7/13- pain doing better per pt- con't regimen             -discussed massage and desensitization  7/15 reports pain under control, continue current for now 4. Mood/Sleep: LCSW to  follow for evaluation and support.              -antipsychotic agents: N/A 5. Neuropsych/cognition: This patient is capable of making decisions on his own behalf. 6. Skin/Wound Care: Continue wound VAC for 7 days post-op  7/6- VAC to be d/c'd 09/05/21  7/7- [facility]  [facility].  7/8 Wound vac removed this afternoon by nurse.  7/9 Wound vac removed 7/8, staples present at incision site, CDI.  Dry dressing with Kerlix present.  7/11- con't shrinker  7/12- educated on to prevent hip/knee contractures- will con't to do so, since poor memory. Wearing limb guard  7/15 Continue discuss contraction prevention  7/17: may remove staples, shrinkers ordered 7. Fluids/Electrolytes/Nutrition: Has been on IVF since admission-->d/c.  --Check CMET in am             --encourage intake. Will change glucerna to TID with meals to avoid BS spikes             --continue Juven, Vitamin C and Zinc to promote wound healing.  8. A fib: Monitor HR TID--currently controlled on Cardizem 120 mg BID and metoprolol 50 mg bid             --on Eliquis w/ H/H stable 9. T2DM: Hgb A1c-7.9-- 10.7 in 4 months likely due to infection/now controlled.   -- Mild DKA-->beta hydroxybutyric acid 3.55-->0.91 --will continue to monitor BS ac/hs and titrate meds as indicated.              --Was on Metformin 1000 mg bid at home             --continue Levemir (increased to 15 u BID 07/04) w/ 4 units novolog  ac TID. Titrate as indicated.  7/5- Cbg's 200-300s- just got increase yesterday- will add metformin 500 mg BID back to regimen.   7/6- CBG's look better this AM- 178- with addition of Metformin- will monitor and make changes tomorrow as required  7/7- CBG's- mid to upper 100s- will increase metformin to 850 mg BID and monitor CBG's.  7/8 Continue to monitor as change to metformin dose still new. 7/9 CBG's 132-161 today.  7/13- CBGs controlled- need to make sure pt knows how to give self insulin at d/c-  teach insulin- will place order for this  7/14- CBGs look great- will ncrease metformin to 1000 mg BID and stop Novolog with meals 4 units with meals since wasn't on insulin at home prior- of note came in with mild DKA  7/16 CBGs well controlled CBG (last 3)  Recent Labs    09/14/21 0632 09/14/21 0651 09/14/21 1216  GLUCAP 67* 103* 93    10. Depressed systolic LVF: Started on Sacubitril-valsartan bid on 07/02. -Monitor renal function              -daily weights 11. ABLA/anemia of chronic illness: Monitor H/H with serial checks. 7/6- Hb 11.6- will monitor to monitor- doesn't want iron at this time.  7/10- Hb 10.90 overall stable, but will monitor trend.  -Weekly labs tomorrow 12. Hypomagnesemia: Improved post IV supplement and on oral supplement -likely due to malnutrition. Will recheck in am.   7/5- Mg level is 1.9- will con't to monitor 7/17: continue magnesium supplement 13. Chronic hyponatremia: resolved, monitor outpatient.  14. Constipation: continue magnesium supplement 7/7- gave sorbitol without a BM- will order soap suds enema today if no BM by 3pm- pt still feels constipated  7/12- LBM yesterday- going well- con't regimen 7/14- LBM 2 days ago- if no BM by 3pm Saturday, give sorbitol 7/15 LBM today, improved    LOS: 13 days A FACE TO FACE EVALUATION WAS PERFORMED  Laiken Nohr P Cherish Runde 09/14/2021, 1:37 PM

## 2021-09-14 NOTE — Progress Notes (Signed)
Patient ID: Randall Coleman, male   DOB: Jul 16, 1950, 71 y.o.   MRN: [id number] Patient is seen in follow-up status post transtibial amputation.  Examination the staples have been removed the wound is well approximated there were a few small areas of gaping of the wound that are being secured with Steri-Strips.  Patient has a 3 extra-large stump shrinker and he will continuing wearing this daily.  I will follow-up in the office in 2 weeks.

## 2021-09-14 NOTE — Plan of Care (Signed)
  Problem: RH BOWEL ELIMINATION Goal: RH STG MANAGE BOWEL WITH ASSISTANCE Description: STG Manage Bowel with Mod I Assistance. Outcome: Progressing   Problem: RH SKIN INTEGRITY Goal: RH STG MAINTAIN SKIN INTEGRITY WITH ASSISTANCE Description: STG Maintain Skin Integrity With Mod I Assistance. Outcome: Progressing   Problem: RH SAFETY Goal: RH STG ADHERE TO SAFETY PRECAUTIONS W/ASSISTANCE/DEVICE Description: STG Adhere to Safety Precautions With Cues and Reminders. Outcome: Progressing

## 2021-09-14 NOTE — Consult Note (Signed)
Neuropsychological Consultation   Patient:   Daylen Lipsky   DOB:   [birth year]  MR Number:  [id number]  Location:  [facility] [address] [facility] [address] [place] [id number] [place] [address] Dept: [place]: [phone]           Date of Service:   09/14/2021  Start Time:   8 AM End Time:   9 AM  Provider/Observer:  Ilean Skill, Psy.D.       Clinical Neuropsychologist       Billing Code/Service: [id number]  Chief Complaint:    Janes Colegrove is a 72 year old male with past history of DVT, type 2 diabetes, hypertension, A-fib on Eliquis, cognitive decline reported by family over the past year, progressive ulceration and drainage from left third toe and osteomyelitis with plans for 3-fifth toe amputation.  Patient is a veteran who served in [place] and was honorably discharged with VA benefits although he has not been utilizing [employer] for healthcare is much as has been available prior.  Patient was admitted on 08/25/2021 with increasing pain and swelling and redness with foul odorous drainage and worsening of cognition.  Patient was noted to be septic and was in mild DKA and in A-fib.  Once medically optimized patient underwent left BKA on 7/1 by Dr. Sharol Given.  Patient has had issues with poorly controlled blood sugars requiring additional insulin in the setting of severe malnutrition.  Patient with weakness, pain and cognitive impairments along with fatigue and anxiety.  Reason for Service:  Patient was referred for neuropsychological consultation due to concerns around memory and cognition, mood status and coping with recent BKA.  Below is the HPI for the current admission.  HPI: Rody Keadle is a 71 year old male with history of DVT, T2DM, HTN,  DVT, A fib- on Eliquis, cognitive decline (in the past year per family reports), progressive ulceration with drainage from left third toe with osteomyelitis with plans for 3-5th toe  amputation. He was admitted on 08/25/21 with increase in pain, swelling, redness, foul odorous drainage and worsening of cognition. He was noted to be septic, was in mild DKA and in A fib with RVR w/ HR 130-170. He was started on Vanc/cefepime/flagyl, IVF and started on Cardizem as well as heparin drips. Dr. Doylene Canard consulted for management of A fib.    Once medically optimized, patient underwent L-BKA 07/01 by Dr. Sharol Given. Post op has some bleeding from wound as well issues with poorly controlled BS requiring additional insulin as well as hypomagnesemia in setting of severe malnutrition. He continues to be limited by weakness, pain, cognitive impairments as well as fatigue and anxiety. Therapy has been working with patient and CIR recommended due to functional decline.   Current Status:  Patient was awake and alert in his wheelchair with positive mood state.  Stated that his anxiety had been better.  Patient admitted to being a "couch potato" for some time and just excepting his declining health.  Patient's cognition and mental status appeared adequate and he was oriented x4 and reported significant improvement over cognition as his medical status has improved.  Patient admitted even prior to his most recent deterioration medically that he had been having more difficulties with memory retrieval and word finding.  Review of previous CT scan done in 2020 identifying small vessel disease and microvascular ischemic changes in white matter these changes identified by family prior to his acute medical illness would likely be directly a result  of this microvascular ischemic changes.  Patient likely has significant small vessel disease secondary to combination of type 2 diabetes, hypertension and other vascular/cerebrovascular changes.  His cognitive status does not appear consistent with a progressive degenerative condition either cortical or subcortical in nature.  The patient is learning new information and his memory  and recall are greater due to retrieval deficits rather than an inability to store and organize new information.  Patient reports improving mood.  Patient had still been struggling with decision to move to [place] to live with his sister and we addressed this issue as well.  Patient's plan is to get a prostatic in the future and will be working with the [place] system in [place].  Patient's sister has her own medical issues that he has some concerns about but patient's sister husband is doing well medically and can assist both of them while the patient is recovering and getting ready for his prosthetic.  Patient has good relationship with his brother-in-law.  Behavioral Observation: Zack Crager  presents as a 71 y.o.-year-old Right handed Caucasian Male who appeared his stated age. his dress was Appropriate and he was Well Groomed and his manners were Appropriate to the situation.  his participation was indicative of Appropriate and Redirectable behaviors.  There were physical disabilities noted.  he displayed an appropriate level of cooperation and motivation.     Interactions:    Active Appropriate  Attention:   abnormal and attention span appeared shorter than expected for age  Memory:   abnormal; remote memory intact, recent memory impaired  Visuo-spatial:  not examined  Speech (Volume):  normal  Speech:   normal; slurred  Thought Process:  Coherent and Relevant  Though Content:  WNL; not suicidal and not homicidal  Orientation:   person, place, time/date, and situation  Judgment:   Fair  Planning:   Poor  Affect:    Appropriate  Mood:    Anxious  Insight:   Fair  Intelligence:   normal  Medical History:   Past Medical History:  Diagnosis Date   Atrial fibrillation ([place])    Diabetes mellitus without complication ([place])    DVT (deep venous thrombosis) ([place])    Hypertension          Patient Active Problem List   Diagnosis Date Noted   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    Cardiomyopathy ([place]) 09/11/2021   Cognitive impairment 09/11/2021   Constipation 09/11/2021   Decreased left ventricular function 09/11/2021   S/P BKA (below knee amputation), left ([place]) 09/01/2021   Protein-calorie malnutrition, severe 08/27/2021   Hyponatremia 08/27/2021   Osteomyelitis ([place]) 04/06/2021   Osteomyelitis of second toe of left foot ([place])    Acute osteomyelitis ([place]) 07/29/2020   Major depressive disorder 12/15/2018   Foot fracture, right 12/11/2018   Foreign body in lung 12/10/2018   Syncope and collapse 12/10/2018   Foot injury, right, initial encounter 12/10/2018   Essential hypertension 12/10/2018   History of DVT of lower extremity 12/10/2018   DKA, type 2 ([place]) 12/10/2018   Atrial fibrillation ([place]) 12/10/2018   Psychiatric History:  With the patient has no prior psychiatric history noted in his medical chart patient acknowledges and family reports that there have been some issues with anxiety likely due to worsening medical and physical status.  Family Med/Psych History:  Family History  Problem Relation Age of Onset   Healthy Sister    Healthy Brother     Impression/DX:  Sam Overbeck is a 71 year old male with  past history of DVT, type 2 diabetes, hypertension, A-fib on Eliquis, cognitive decline reported by family over the past year, progressive ulceration and drainage from left third toe and osteomyelitis with plans for 3-fifth toe amputation.  Patient is a veteran who served in [place] and was honorably discharged with VA benefits although he has not been utilizing [employer] for healthcare is much as has been available prior.  Patient was admitted on 08/25/2021 with increasing pain and swelling and redness with foul odorous drainage and worsening of cognition.  Patient was noted to be septic and was in mild DKA and in A-fib.  Once medically optimized patient underwent left BKA on 7/1 by Dr. Sharol Given.  Patient has had issues with poorly controlled blood sugars  requiring additional insulin in the setting of severe malnutrition.  Patient with weakness, pain and cognitive impairments along with fatigue and anxiety.  Koran Seabrook is a 71 year old male with past history of DVT, type 2 diabetes, hypertension, A-fib on Eliquis, cognitive decline reported by family over the past year, progressive ulceration and drainage from left third toe and osteomyelitis with plans for 3-fifth toe amputation.  Patient is a veteran who served in [place] and was honorably discharged with VA benefits although he has not been utilizing [employer] for healthcare is much as has been available prior.  Patient was admitted on 08/25/2021 with increasing pain and swelling and redness with foul odorous drainage and worsening of cognition.  Patient was noted to be septic and was in mild DKA and in A-fib.  Once medically optimized patient underwent left BKA on 7/1 by Dr. Sharol Given.  Patient has had issues with poorly controlled blood sugars requiring additional insulin in the setting of severe malnutrition.  Patient with weakness, pain and cognitive impairments along with fatigue and anxiety.  Disposition/Plan:  Today we worked on coping and adjustment and addressed issues of preparing for prostatic in 3 to 4 months and plan for going to live with his sister in [place] and how that would work out.  Patient is becoming more comfortable with this change in living status is essentially a more permanent change.  Diagnosis:    Below-knee amputation of left lower extremity ([place]) - Plan: CANCELED: Ambulatory referral to Physical Medicine Rehab         Electronically Signed   _______________________ Ilean Skill, Psy.D. Clinical Neuropsychologist

## 2021-09-14 NOTE — Progress Notes (Signed)
Hypoglycemic Event  CBG: 67  Treatment: 4 oz juice/soda  Symptoms: None  Follow-up CBG: [id number] CBG Result:74  Possible Reasons for Event: Medication regimen: levemir  Comments/MD notified: Dan Angiulli-PA    Sallye Lat

## 2021-09-14 NOTE — Progress Notes (Signed)
Staples removed per order. Incision reinforced with steristrips. Minimal sanguineous drainage noted. No complications. Dr. Sharol Given came by to see incision. No new orders. Sheela Stack, LPN

## 2021-09-14 NOTE — Progress Notes (Signed)
Inpatient Rehabilitation Discharge Medication Review by a Pharmacist  A complete drug regimen review was completed for this patient to identify any potential clinically significant medication issues.  High Risk Drug Classes Is patient taking? Indication by Medication  Antipsychotic No   Anticoagulant Yes Apixaban for Afib   Antibiotic No   Opioid Yes Tramadol and Oxycodone for pain   Antiplatelet No   Hypoglycemics/insulin Yes Metformin for diabetes  Vasoactive Medication Yes Metoprolol and diltiazem for Afib and HTN Entresto for HF   Chemotherapy No   Other Yes Duloxetine and pregabalin for nerve pain and anxiety Mag ox for supplementation Nicotine patch for smoking cessation     Type of Medication Issue Identified Description of Issue Recommendation(s)  Drug Interaction(s) (clinically significant)     Duplicate Therapy     Allergy     No Medication Administration End Date     Incorrect Dose     Additional Drug Therapy Needed     Significant med changes from prior encounter (inform family/care partners about these prior to discharge). New diltiazem, duloxetine, pregabalin, tramadol, oxycodone, entresto  Educate at discharge   Other       Clinically significant medication issues were identified that warrant physician communication and completion of prescribed/recommended actions by midnight of the next day:  No   Time spent performing this drug regimen review (minutes): 30  Tywanda Rice BS, PharmD, BCPS Clinical Pharmacist 09/15/2021 8:31 AM  Contact: [phone] after 3 PM  "Be curious, not judgmental..." -Jamal Maes

## 2021-09-14 NOTE — Progress Notes (Signed)
Occupational Therapy Discharge Summary  Patient Details  Name: Randall Coleman MRN: [id number] Date of Birth: 1950-10-25   Patient has met 6 of 6 long term goals due to {due to:3041651}.  Pt made steady progress with BADLs and functional transfers during this admission. Pt completes bathing/dressing tasks and toileting at supervision level. Pt performs BSC transfers with supervision. Pt requires min verbal cues for safety awareness. Recommend 24 hour supervision. Pt discharging to home of sister. Family has not been present for education. Patient to discharge at overall {LOA:3049010} level.  Patient's care partner {care partner:3041650} to provide the necessary {assistance:3041652} assistance at discharge.    Reasons goals not met: n/a  Recommendation:  Patient will benefit from ongoing skilled OT services in {setting:3041680} to continue to advance functional skills in the area of {ADL/iADL:3041649}.  Equipment: TTB, DABSC  Reasons for discharge: {Reason for discharge:3049018}  Patient/family agrees with progress made and goals achieved: {Pt/Family agree with progress/goals:3049020}  OT Discharge  ADL ADL Eating: Independent Where Assessed-Eating: Wheelchair Grooming: Independent Where Assessed-Grooming: Sitting at sink Upper Body Bathing: Modified independent Where Assessed-Upper Body Bathing: Sitting at sink Lower Body Bathing: Supervision/safety Where Assessed-Lower Body Bathing: Standing at sink, Sitting at sink Upper Body Dressing: Independent Where Assessed-Upper Body Dressing: Sitting at sink Lower Body Dressing: Supervision/safety Where Assessed-Lower Body Dressing: Sitting at sink, Standing at sink Toileting: Supervision/safety Where Assessed-Toileting: Bedside Commode, Toilet Toilet Transfer: Close supervision Toilet Transfer Method: Arts development officer: Bedside commode, Grab bars Tub/Shower Transfer: Metallurgist Method: Sit  pivot Tub/Shower Equipment: Facilities manager: Close supervision Social research officer, government Method: Radiographer, therapeutic: Radio broadcast assistant ADL Comments: sponge bath Vision Baseline Vision/History: 1 Wears glasses Patient Visual Report: No change from baseline Vision Assessment?: No apparent visual deficits Perception  Perception: Within Functional Limits Praxis Praxis: Intact Cognition Cognition Overall Cognitive Status: History of cognitive impairments - at baseline Arousal/Alertness: Awake/alert Orientation Level: Person;Situation;Place Person: Oriented Place: Oriented Situation: Oriented Memory: Impaired Memory Impairment: Decreased recall of new information Decreased Long Term Memory: Verbal basic Decreased Short Term Memory: Verbal basic;Functional basic Attention: Sustained;Selective Focused Attention: Appears intact Sustained Attention: Appears intact Selective Attention: Impaired Awareness: Appears intact Problem Solving: Appears intact Safety/Judgment: Appears intact Brief Interview for Mental Status (BIMS) Repetition of Three Words (First Attempt): 3 Temporal Orientation: Year: Correct Temporal Orientation: Month: Accurate within 5 days Temporal Orientation: Day: Incorrect Recall: "Sock": Yes, after cueing ("something to wear") Recall: "Blue": Yes, after cueing ("a color") Recall: "Bed": No, could not recall BIMS Summary Score: 10 Sensation Sensation Light Touch: Impaired Detail Light Touch Impaired Details: Impaired RLE;Impaired LLE Hot/Cold: Appears Intact Proprioception: Appears Intact Stereognosis: Not tested Coordination Gross Motor Movements are Fluid and Coordinated: Yes Fine Motor Movements are Fluid and Coordinated: Yes Motor  Motor Motor: Abnormal postural alignment and control Motor - Skilled Clinical Observations: impaired 2/2 global weakness and L AKA Trunk/Postural Assessment  Cervical  Assessment Cervical Assessment: Exceptions to [facility] (forward head) Thoracic Assessment Thoracic Assessment: Exceptions to [facility] (rounded shoulders) Lumbar Assessment Lumbar Assessment: Exceptions to [facility] (posterior pelvic tilt) Postural Control Postural Control: Deficits on evaluation  Balance Static Sitting Balance Static Sitting - Balance Support: No upper extremity supported;Feet supported Static Sitting - Level of Assistance: 7: Independent Dynamic Sitting Balance Dynamic Sitting - Balance Support: During functional activity Dynamic Sitting - Level of Assistance: 6: Modified independent (Device/Increase time) Extremity/Trunk Assessment RUE Assessment RUE Assessment: Within Functional Limits LUE Assessment LUE Assessment: Within Functional Limits  Leotis Shames [facility] 09/14/2021, 8:00 AM

## 2021-09-14 NOTE — Progress Notes (Signed)
Orthopedic Tech Progress Note Patient Details:  Randall Coleman 13-Dec-1950 [id number]  Called in order to [facility] for  [place]   Patient ID: Randall Coleman, male   DOB: 07/27/1950, 71 y.o.   MRN: [id number]  Janit Pagan 09/14/2021, 12:24 PM

## 2021-09-14 NOTE — Plan of Care (Signed)
Patient ready for discharge. 

## 2021-09-14 NOTE — Progress Notes (Signed)
Orthopedic Tech Progress Note Patient Details:  Masami Plata Oct 24, 1950 [id number]  Already called in order to [employer] for 3XL SHRINKERS   Patient ID: Titus Drone, male   DOB: 08/08/1950, 71 y.o.   MRN: [id number]  Janit Pagan 09/14/2021, 1:43 PM

## 2021-09-14 NOTE — Progress Notes (Signed)
Occupational Therapy Session Note  Patient Details  Name: Randall Coleman MRN: [id number] Date of Birth: June 09, 1950  Today's Date: 09/14/2021 OT Individual Time: 7829-5621 OT Individual Time Calculation (min): 43 min    Short Term Goals: Week 2:  OT Short Term Goal 1 (Week 2): STG=LTG 2/2 ELOS  Skilled Therapeutic Interventions/Progress Updates:    Patient received seated in wheelchair, family in room discussing discharge planning.  Family making arrangements for closing out patient's current apartment, and determining what he will move to [place] with tomorrow.  Patient unaware of prior therapy - even with overt cueing and use of schedule.  Patient able to tell very detailed stories from his time in the [place], but unable to recall PT session just prior to this session, color or license plate on his car.  Patient aware that short term memory has been failing slowly over time, and now is "no good."  Patient seems accepting of plan to live in [place] with his sister.   Patient able to articulate balance as a significant problem area.  Worked on stand tolerance and static stand balance.  Patient very dependent on UE's for support in standing - however, in functional conditions, able to free up one arm at a time for activity like pulling pants up/down over hips.  Patient stood x 1.5 min with activity to distract.  Patient unable to stand without UE support - leans toward left and not able to correct.  Patient able to reduce pressure thru hands in prep for unweighting UE's.   Patient left up in wheelchair with safety belt in place and engaged and persona items in reach.    Therapy Documentation Precautions:  Precautions Precautions: Fall Precaution Comments: educated on wearing limb guard and shrinker Required Braces or Orthoses: Other Brace Other Brace: L limb protector Restrictions Weight Bearing Restrictions: Yes LLE Weight Bearing: Non weight bearing  Pain:  Denies pain    Therapy/Group:  Individual Therapy  Mariah Milling 09/14/2021, 12:45 PM

## 2021-09-14 NOTE — Progress Notes (Signed)
Patient ID: Randall Coleman, male   DOB: [birth year], 71 y.o.   MRN: [id number]  SW waiting on update from [place] about DME status.   SW spoke with [name] to discuss if her husband and brother have arrived. Stated her husband will arrive later on today due to flight cancellation, and pt brother and sister here now clearing out apartment, and will go by and pick up meds. SW shared will update when more information about DME. She will follow-up with SW once she finds a PCP. Likely will just take him to local [place] office.   SW spoke with Randall Coleman who reported that vendor is going to try and deliver w/c by 10am tomorrow, however, it can be bewtween 9am-11am. SW shared challenge with his flight leaving at 12:30pm.   SW met with pt in room to discuss above. SW left message for [name] informing on DME updates as well also.   Loralee Pacas, MSW, [place] Office: [phone] Cell: [phone] Fax: [phone]

## 2021-09-14 NOTE — Telephone Encounter (Signed)
Linna Hoff, PA at [facility] would like to know if Sharol Given would be stopping by to see patient? Patient will be leaving tomorrow and concerned about staples being removed.  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  Patient is in room, 83M-12 at [facility]  Please advise.  Thank you.

## 2021-09-14 NOTE — Progress Notes (Signed)
Occupational Therapy Session Note  Patient Details  Name: Randall Coleman MRN: [id number] Date of Birth: [birth year]  Today's Date: 09/14/2021 OT Individual Time: 0700-0800 OT Individual Time Calculation (min): 60 min    Short Term Goals: Week 2:  OT Short Term Goal 1 (Week 2): STG=LTG 2/2 ELOS  Skilled Therapeutic Interventions/Progress Updates:    Pt resting in bed upon arrival and ready to get OOB and change clothing. OT intervention with focus on ongoing BADL retraining, sitting balance, standing balance, w/c mobility, discharge planning, squat pivot transfers, and safety awareness to increase indepdnence with BADLs. Bathing/dressing EOB with supervision after setup. Pt dons limb guard with supervision. Squat pivot tranfsers with supervision. W/c setup with supervision. Pt with good safety awareness pertaining to w/c safety. W/c mobility in room and hallway with supervision. Pt made steady progress with BADLs and functional transfers during this admission. Pt reamined in w/c with belt alarm activated. All needs within reach.   Therapy Documentation Precautions:  Precautions Precautions: Fall Precaution Comments: wound vac Required Braces or Orthoses: Other Brace Other Brace: L limb protector Restrictions Weight Bearing Restrictions: Yes LLE Weight Bearing: Non weight bearing Pain: Pt reports 7/10 LLE phantom pain; meds requested from RN  Therapy/Group: Individual Therapy  Leroy Libman 09/14/2021, 8:03 AM

## 2021-09-15 DIAGNOSIS — D638 Anemia in other chronic diseases classified elsewhere: Secondary | ICD-10-CM

## 2021-09-15 DIAGNOSIS — I739 Peripheral vascular disease, unspecified: Secondary | ICD-10-CM

## 2021-09-15 LAB — GLUCOSE, CAPILLARY: Glucose-Capillary: 111 mg/dL — ABNORMAL HIGH (ref 70–99)

## 2021-09-15 NOTE — Progress Notes (Signed)
PROGRESS NOTE   Subjective/Complaints: Pt in good spirits. Denies pain. Going up to [place] to stay with family but plans on coming back here  ROS: Patient denies fever, rash, sore throat, blurred vision, dizziness, nausea, vomiting, diarrhea, cough, shortness of breath or chest pain, joint or back/neck pain, headache, or mood change.   Objective:   No results found. Recent Labs    09/14/21 0614  WBC 5.4  HGB 10.9*  HCT 32.7*  PLT 204     Recent Labs    09/14/21 0614  NA 138  K 4.1  CL 104  CO2 26  GLUCOSE 69*  BUN 21  CREATININE 0.63  CALCIUM 9.0      Intake/Output Summary (Last 24 hours) at 09/15/2021 0925 Last data filed at 09/15/2021 0545 Gross per 24 hour  Intake 1160 ml  Output 2050 ml  Net -890 ml        Physical Exam: Vital Signs Blood pressure 105/60, pulse (!) 58, temperature 97.7 F (36.5 C), temperature source Oral, resp. rate 16, height '6\' 2"'$  (1.88 m), weight 84.6 kg, SpO2 98 %.  Constitutional: No distress . Vital signs reviewed. HEENT: NCAT, EOMI, oral membranes moist Neck: supple Cardiovascular: RRR without murmur. No JVD    Respiratory/Chest: CTA Bilaterally without wheezes or rales. Normal effort    GI/Abdomen: BS +, non-tender, non-distended Ext: no clubbing, cyanosis, or edema Psych: pleasant and cooperative  Musculoskeletal:     Comments: Left BK stump still swollen but improving, wearing shrinker Skin:    incision cdi Neurological:     Mental Status: He is alert and oriented to person, place, and time.  Follows commands    Cranial Nerves: No cranial nerve deficit.     Comments: Oriented to self and situation, date. Improved insight CN exam intact. UE motor 5/5. RLE 4/5. LLE 3/5 HF, KE/KF. Decreased LT distal RLE below ankles   Assessment/Plan: 1. Functional deficits which require 3+ hours per day of interdisciplinary therapy in a comprehensive inpatient rehab  setting. Physiatrist is providing close team supervision and 24 hour management of active medical problems listed below. Physiatrist and rehab team continue to assess barriers to discharge/monitor patient progress toward functional and medical goals  Care Tool:  Bathing    Body parts bathed by patient: Right arm, Left arm, Chest, Abdomen, Front perineal area, Buttocks, Right upper leg, Left upper leg, Right lower leg   Body parts bathed by helper: Right lower leg Body parts n/a: Left lower leg   Bathing assist Assist Level: Supervision/Verbal cueing     Upper Body Dressing/Undressing Upper body dressing   What is the patient wearing?: Pull over shirt    Upper body assist Assist Level: Independent    Lower Body Dressing/Undressing Lower body dressing      What is the patient wearing?: Pants     Lower body assist Assist for lower body dressing: Supervision/Verbal cueing     Toileting Toileting    Toileting assist Assist for toileting: Supervision/Verbal cueing     Transfers Chair/bed transfer  Transfers assist     Chair/bed transfer assist level: Supervision/Verbal cueing     Locomotion Ambulation   Ambulation assist  Ambulation activity did not occur: Safety/medical concerns  Assist level: Contact Guard/Touching assist Assistive device: Walker-rolling Max distance: 73f   Walk 10 feet activity   Assist  Walk 10 feet activity did not occur: Safety/medical concerns  Assist level: Contact Guard/Touching assist Assistive device: Walker-rolling   Walk 50 feet activity   Assist Walk 50 feet with 2 turns activity did not occur: Safety/medical concerns         Walk 150 feet activity   Assist Walk 150 feet activity did not occur: Safety/medical concerns         Walk 10 feet on uneven surface  activity   Assist Walk 10 feet on uneven surfaces activity did not occur: Safety/medical concerns         Wheelchair     Assist Is the  patient using a wheelchair?: Yes Type of Wheelchair: Manual Wheelchair activity did not occur: Refused  Wheelchair assist level: Independent, Supervision/Verbal cueing Max wheelchair distance: 1569f   Wheelchair 50 feet with 2 turns activity    Assist    Wheelchair 50 feet with 2 turns activity did not occur: Refused   Assist Level: Independent   Wheelchair 150 feet activity     Assist  Wheelchair 150 feet activity did not occur: Refused   Assist Level: Independent   Blood pressure 105/60, pulse (!) 58, temperature 97.7 F (36.5 C), temperature source Oral, resp. rate 16, height '6\' 2"'$  (1.88 m), weight 84.6 kg, SpO2 98 %.  Medical Problem List and Plan: 1. 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 Will need a smaller shrinker soon   -will not have him f/u with [facility] 2.  Antithrombotics: -DVT/anticoagulation:  Pharmaceutical: Other (comment)--Eliquis             -antiplatelet therapy:  N/A 3. Pain Management: Oxycodone prn             -add gabapentin '100mg'$  TID for phantom limb pain  7/7- will d/c Gabapentin due ot memory issues and add Lyrica 50 mg TID; also started Cymbalta yesterday- no nausea so far-  7/9 No side effects from Cymbalta, continue Lyrica 50 mg TID  7/10- no side effects- will increase Cymbalta as of the AM to 60 mg daily.   7/11- no side effects with increase in cymbalta.   7/12- also getting mirror therapy -feels that's somewhat helpful-   7/13- pain doing better per pt- con't regimen             -discussed massage and desensitization  7/18 pain controlled 4. Mood/Sleep: LCSW to follow for evaluation and support.              -antipsychotic agents: N/A 5. Neuropsych/cognition: This patient is capable of making decisions on his own behalf. 6. Skin/Wound Care: staples out  -continue shrinker, smaller one soon    7.  Fluids/Electrolytes/Nutrition:  Juven, Vitamin C and Zinc to promote wound healing.  8. A fib: Monitor HR TID--currently controlled on Cardizem 120 mg BID and metoprolol 50 mg bid             --on Eliquis w/ H/H stable 9. T2DM: Hgb A1c-7.9-- 10.7 in 4 months likely due to infection/now controlled.   -- Mild DKA-->beta hydroxybutyric acid 3.55-->0.91 --will continue to monitor BS ac/hs and titrate meds as indicated.              --  Was on Metformin 1000 mg bid at home             -7/18 dc levemir, resume home metformin dosing CBG (last 3)  Recent Labs    09/14/21 1948 09/14/21 2246 09/15/21 0543  GLUCAP 115* 77 111*    10. Depressed systolic LVF: Started on Sacubitril-valsartan bid on 07/02. -Monitor renal function              -daily weights 11. ABLA/anemia of chronic illness: Monitor H/H with serial checks. 7/18 hgb up to 10.9 12. Hypomagnesemia: Improved post IV supplement and on oral supplement -likely due to malnutrition. Will recheck in am.   7/5- Mg level is 1.9- will con't to monitor 7/17: continue magnesium supplement 13. Chronic hyponatremia: resolved, monitor outpatient.  14. Constipation: continue magnesium supplement Moving bowels    LOS: 14 days A FACE TO FACE EVALUATION WAS PERFORMED  Meredith Staggers 09/15/2021, 9:25 AM

## 2021-09-15 NOTE — Progress Notes (Signed)
Patient anticipates discharge today, continue to verbalize discomfort pain with pain score 8/10 to left surgical site,left stump BKA with stri strips intact,no dainage drainage  surgical site medication provided during shift x2. Continue regime, made as comfortable as possible. Call bell and bed alarm in place

## 2021-09-15 NOTE — Progress Notes (Signed)
Inpatient Rehabilitation Care Coordinator Discharge Note   Patient Details  Name: Randall Coleman MRN: [id number] Date of Birth: June 05, 1950   Discharge location: D/c to [place] with his family.  Length of Stay: 13 days  Discharge activity level: discharge at a wheelchair level Supervision  Home/community participation: Limited  Patient response OF:HQRFXJ Literacy - How often do you need to have someone help you when you read instructions, pamphlets, or other written material from your doctor or pharmacy?: Never  Patient response OI:TGPQDI Isolation - How often do you feel lonely or isolated from those around you?: Never  Services provided included: MD, RD, PT, OT, SLP, Pharmacy, Neuropsych, SW, TR, CM, RN  Financial Services:  Charity fundraiser Utilized: Scientist, research (life sciences) offered to/list presented to: Yes  Follow-up services arranged:  DME      DME : DME to be delivered to the home in [place], TTB, DABSC, and likley w/c now that family left without w/c.    Patient response to transportation need: Is the patient able to respond to transportation needs?: Yes In the past 12 months, has lack of transportation kept you from medical appointments or from getting medications?: No In the past 12 months, has lack of transportation kept you from meetings, work, or from getting things needed for daily living?: No   Comments (or additional information):  Patient/Family verbalized understanding of follow-up arrangements:  Yes  Individual responsible for coordination of the follow-up plan: contact pt sister Malachy Mood  Confirmed correct DME delivered: Rana Snare 09/15/2021    Rana Snare

## 2021-09-15 NOTE — Progress Notes (Signed)
PA discussed discharge instructions with pt. Pt/family in agreement. Discussed wound care and BKA care. Pt in agreement. Pt left without equipment due to flight at airport. SW aware.  Sheela Stack, LPN

## 2021-09-15 NOTE — Progress Notes (Signed)
Patient ID: Randall Coleman, male   DOB: 22-Feb-1951, 71 y.o.   MRN: [id number]  SW informed by nursing staff that they were unable to wait for w/c to be delivered due to concerns with scheduled flight to [place]. W/c now here. SW informed [facility] to discuss picking up item.   Loralee Pacas, MSW, [place] Office: [phone] Cell: [phone] Fax: [phone]

## 2021-09-29 ENCOUNTER — Encounter: Payer: Medicare Other | Attending: Registered Nurse | Admitting: Registered Nurse

## 2021-10-06 ENCOUNTER — Telehealth: Payer: Self-pay

## 2021-10-06 NOTE — Telephone Encounter (Signed)
I called and sw pt's sister Malachy Mood about this pt. He moved to [place] the day he was d/c from [facility]. On his second day in [place] he had an appt at the [facility] center in [place] and was admitted that day for what they felt was an infection of the [place] he was started on IV vanc and was there for 4 days. The cultures did not show infection per the pt's sister but cleared up the redness. The pt was then transferred to an amputee floor for rehab and has been there since. He is set to d/c on Friday 10/09/21 she will text Dr. Sharol Given a picture of the limb and this will count as the 3rd visit  for the registry. The pt is doing well has made progress getting ready for prosthetic and has good family support. Will hold message pending photo and to document visit in smart trial

## 2022-07-26 IMAGING — CR DG CHEST 2V
2 series · 3 of 3 positions shown · non-contrast
Comparison: Chest x-ray 12/09/2018

CLINICAL DATA: Shortness of breath

EXAM:
CHEST - 2 VIEW

[Series 2: chest lat · 0.14mm/px · 2 of 2 slices shown]
[im 1/2]
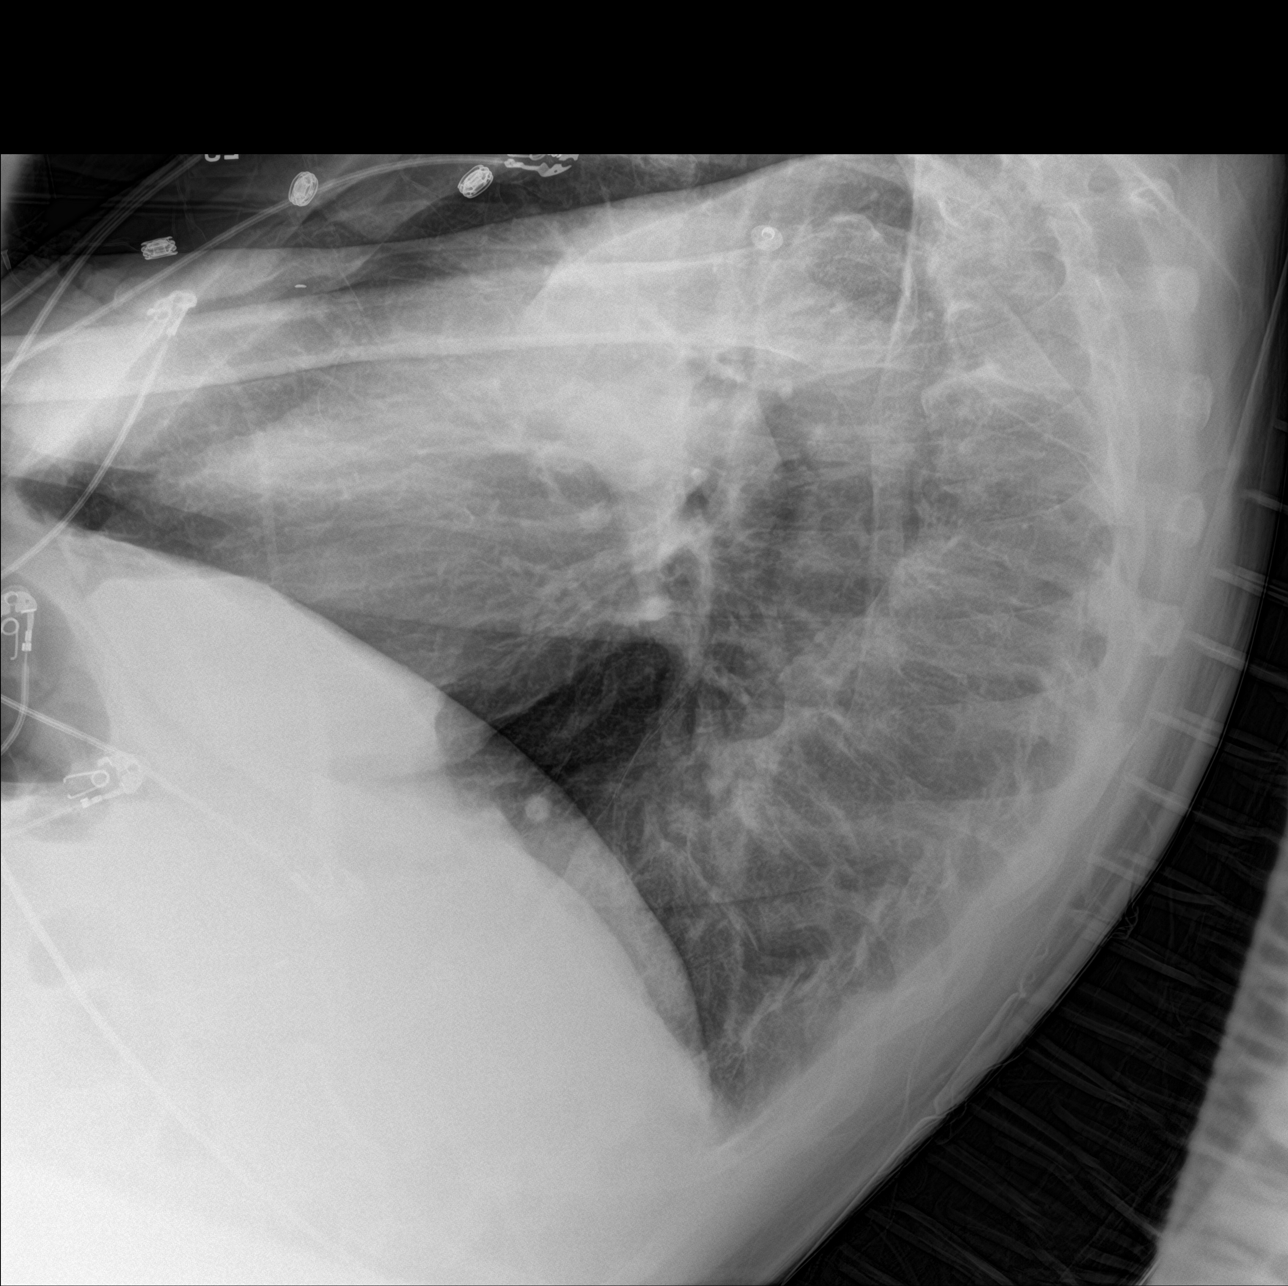
[im 2/2]
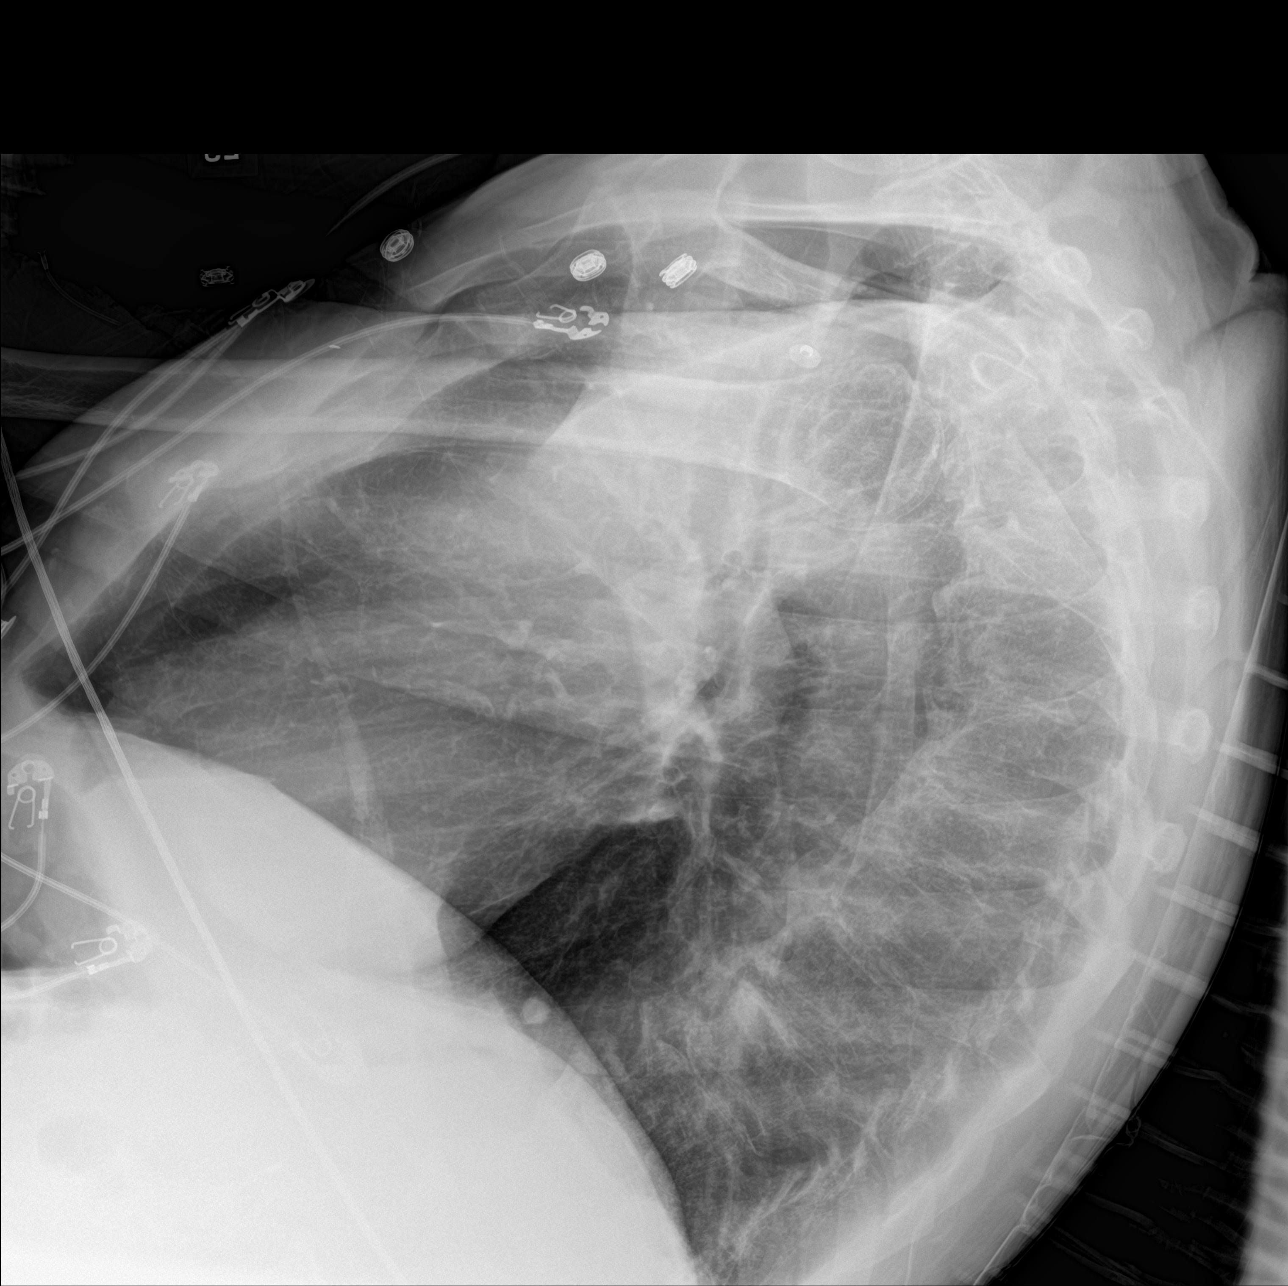

[chest ap]
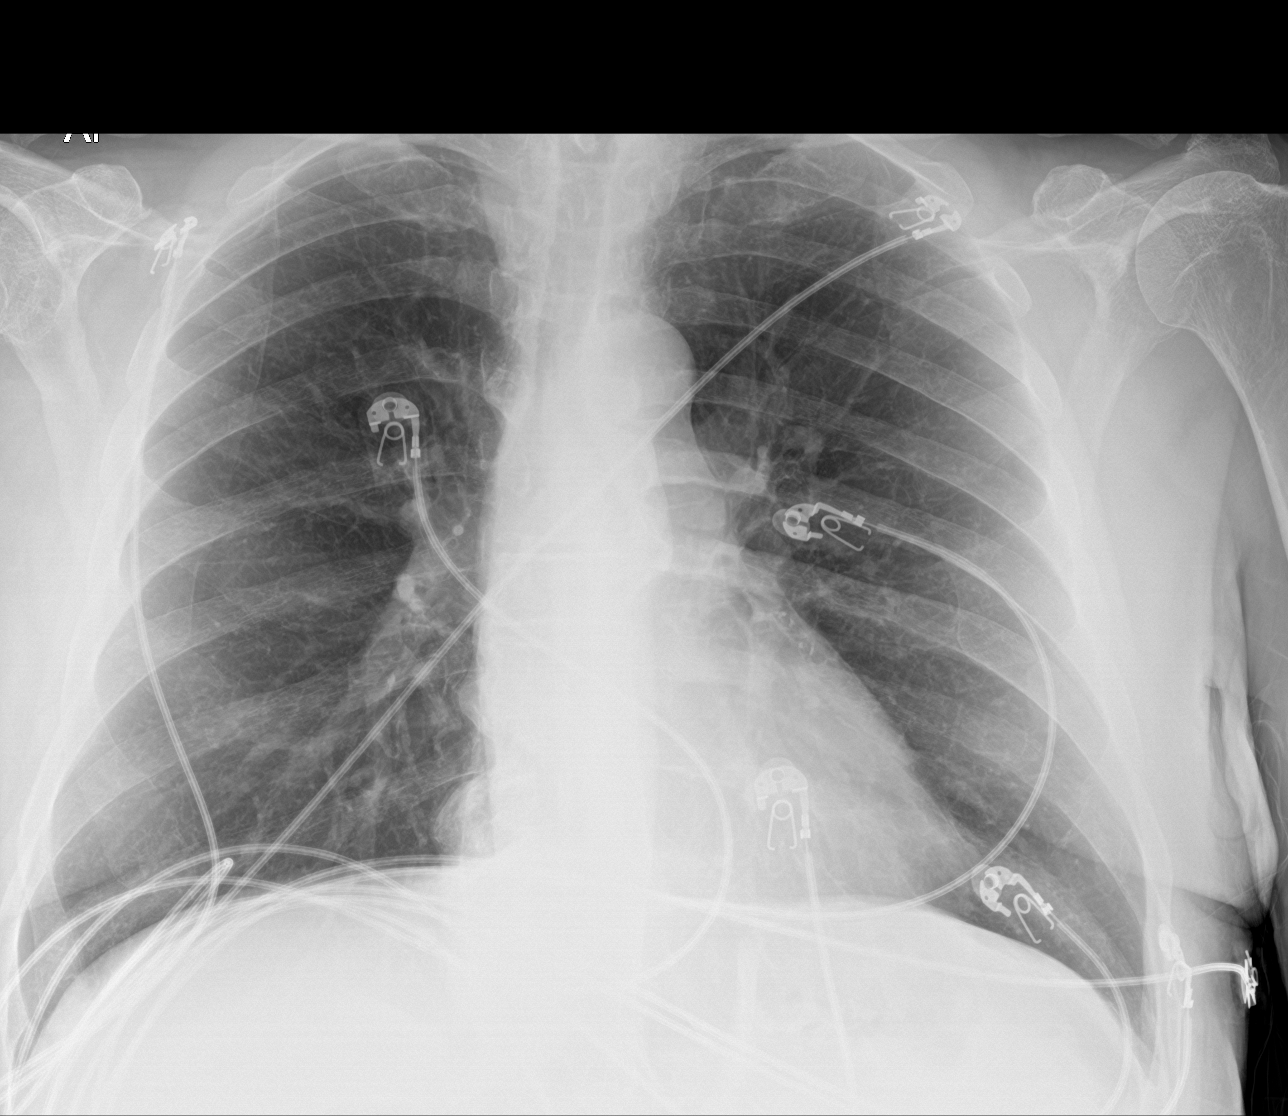

[3 of 3 positions shown; findings below may reference images not displayed]

FINDINGS: Heart size and mediastinal contours are within normal limits. No
suspicious pulmonary opacities identified.

No pleural effusion or pneumothorax visualized.

No acute osseous abnormality appreciated.
IMPRESSION: No acute intrathoracic process identified.

## 2022-11-14 IMAGING — DX DG FOOT COMPLETE 3+V*L*
3 series · 3 of 3 positions shown · non-contrast
Comparison: LEFT foot XRs, most recently 04/06/2021.

CLINICAL DATA: Possible osteo 3rd digit

EXAM:
LEFT FOOT - COMPLETE 3+ VIEW

[foot ap]
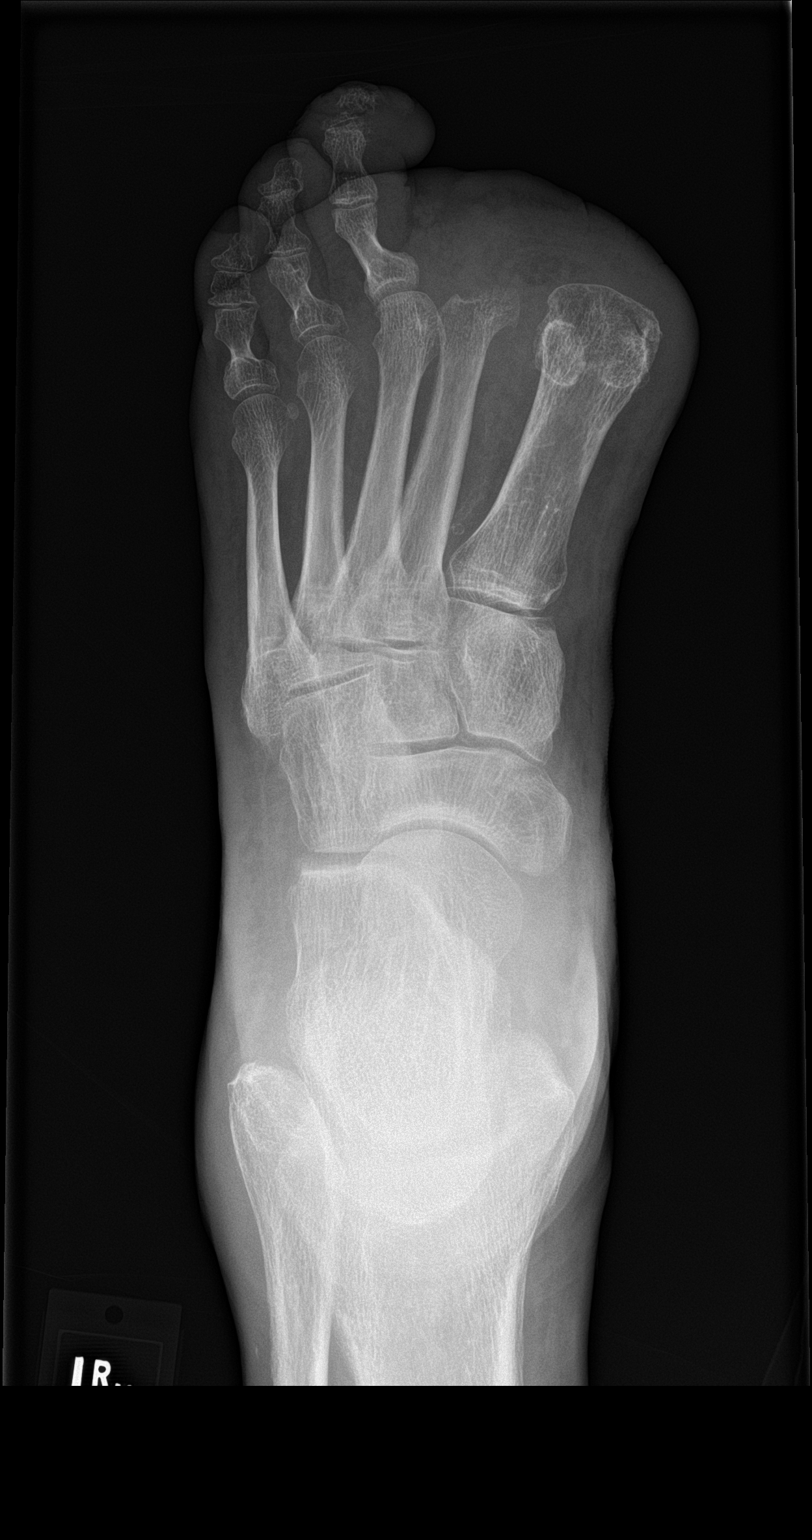

[foot obl]
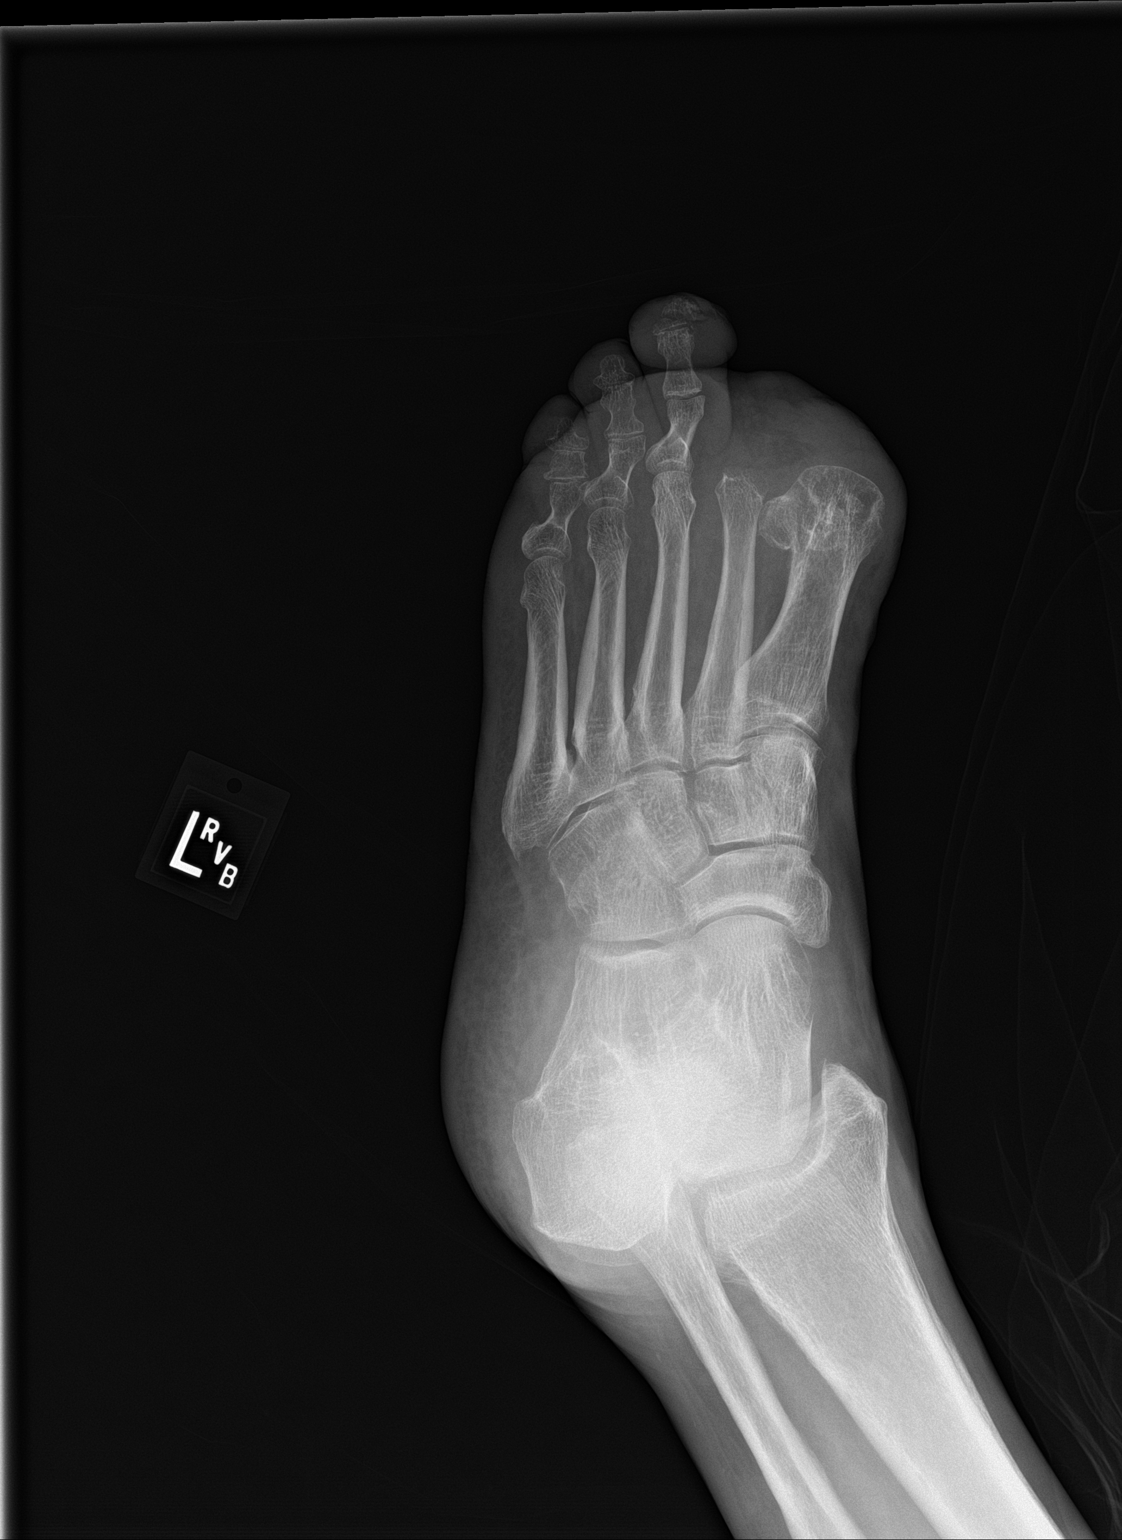

[foot lat]
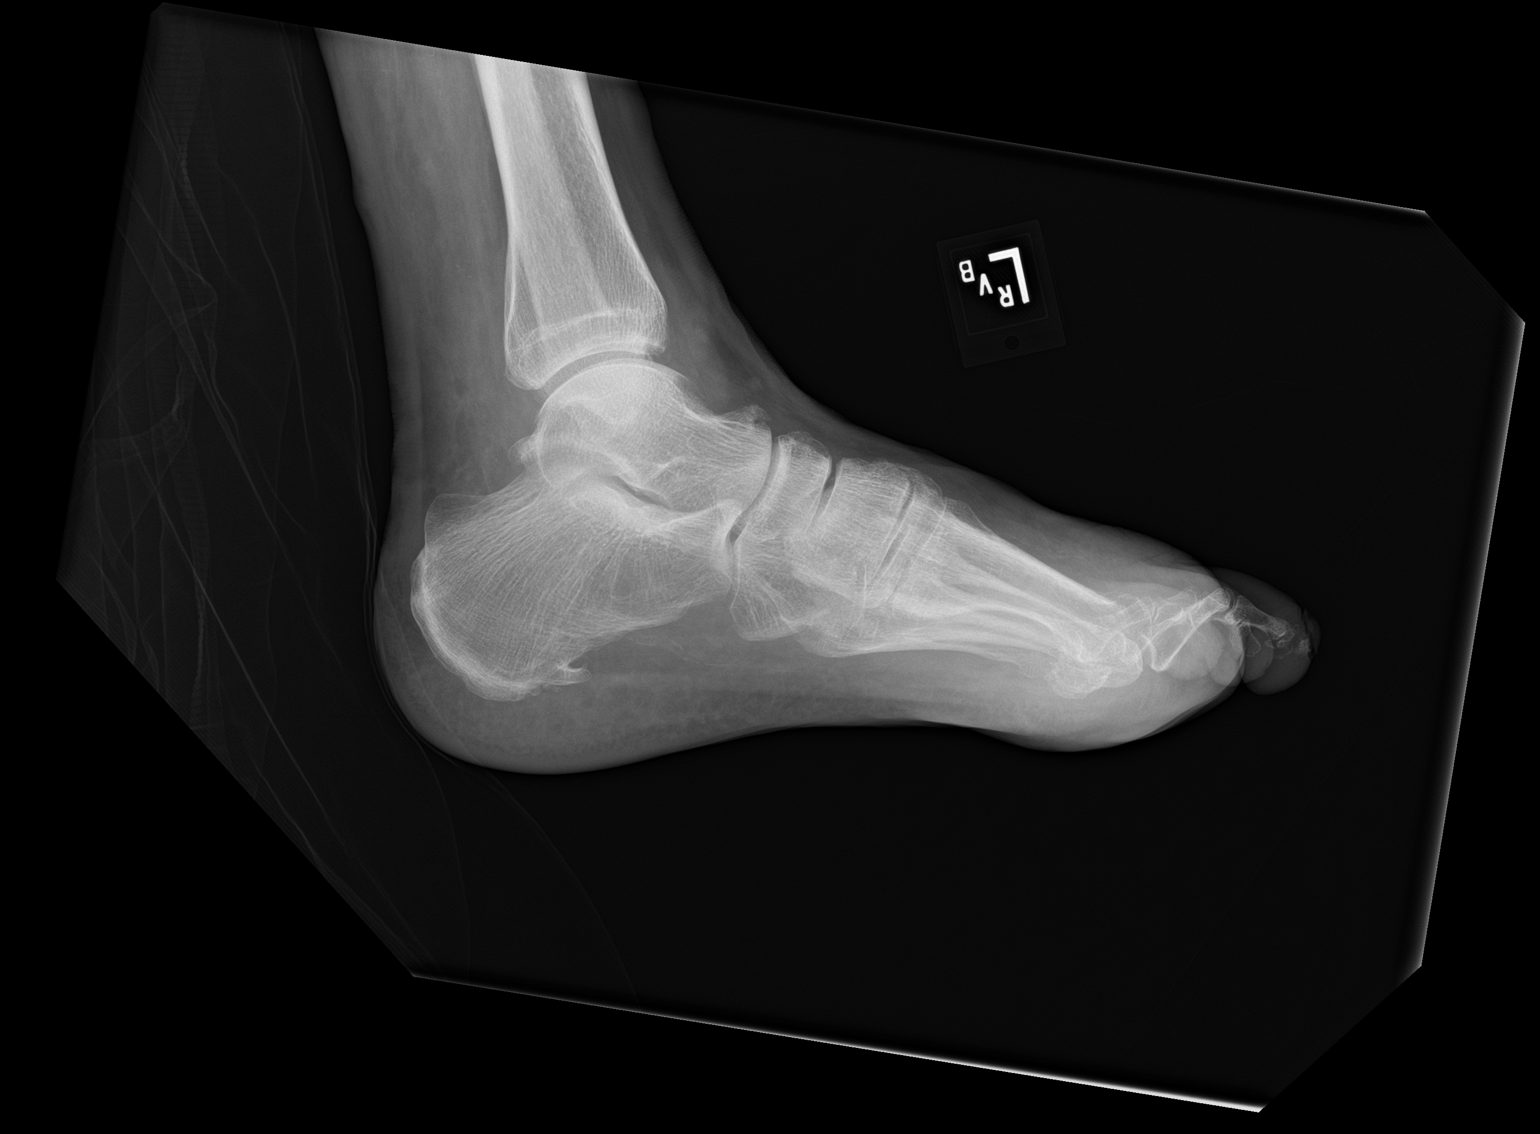

[3 of 3 positions shown; findings below may reference images not displayed]

FINDINGS: Interval amputation of the LEFT second phalanges. Similar appearance
of prior LEFT first filling amputation.

Soft tissue thickening of LEFT third digit with demineralization,
erosion or periosteal change involving distal phalanx. See key
image.

No acute fracture or dislocation. Small plantar enthesophyte.
Vascular calcifications.
IMPRESSION: Soft tissue thickening with underlying radiographic changes
suspicious for osteomyelitis of the LEFT third distal phalanx.
